# Patient Record
Sex: Female | Born: 1950 | Race: White | Hispanic: No | Smoking: Former smoker
Health system: Southern US, Community
[De-identification: ages and names within clinical notes are randomized; demographics above are authoritative.]

## PROBLEM LIST (undated history)

## (undated) DIAGNOSIS — M1990 Unspecified osteoarthritis, unspecified site: Secondary | ICD-10-CM

## (undated) DIAGNOSIS — N2589 Other disorders resulting from impaired renal tubular function: Secondary | ICD-10-CM

## (undated) DIAGNOSIS — F319 Bipolar disorder, unspecified: Secondary | ICD-10-CM

## (undated) DIAGNOSIS — E782 Mixed hyperlipidemia: Secondary | ICD-10-CM

## (undated) DIAGNOSIS — Z9989 Dependence on other enabling machines and devices: Secondary | ICD-10-CM

## (undated) DIAGNOSIS — I2510 Atherosclerotic heart disease of native coronary artery without angina pectoris: Secondary | ICD-10-CM

## (undated) DIAGNOSIS — I10 Essential (primary) hypertension: Secondary | ICD-10-CM

## (undated) DIAGNOSIS — I509 Heart failure, unspecified: Secondary | ICD-10-CM

## (undated) DIAGNOSIS — I471 Supraventricular tachycardia, unspecified: Secondary | ICD-10-CM

## (undated) DIAGNOSIS — E119 Type 2 diabetes mellitus without complications: Secondary | ICD-10-CM

## (undated) DIAGNOSIS — G4733 Obstructive sleep apnea (adult) (pediatric): Secondary | ICD-10-CM

## (undated) HISTORY — PX: OTHER SURGICAL HISTORY: SHX169

## (undated) HISTORY — DX: Other disorders resulting from impaired renal tubular function: N25.89

## (undated) HISTORY — DX: Supraventricular tachycardia: I47.1

## (undated) HISTORY — DX: Type 2 diabetes mellitus without complications: E11.9

## (undated) HISTORY — DX: Supraventricular tachycardia, unspecified: I47.10

## (undated) HISTORY — DX: Atherosclerotic heart disease of native coronary artery without angina pectoris: I25.10

## (undated) HISTORY — DX: Obstructive sleep apnea (adult) (pediatric): G47.33

## (undated) HISTORY — DX: Mixed hyperlipidemia: E78.2

## (undated) HISTORY — PX: CATARACT EXTRACTION: SUR2

## (undated) HISTORY — DX: Dependence on other enabling machines and devices: Z99.89

## (undated) HISTORY — DX: Bipolar disorder, unspecified: F31.9

## (undated) HISTORY — DX: Essential (primary) hypertension: I10

---

## 2004-03-19 ENCOUNTER — Ambulatory Visit: Payer: Self-pay | Admitting: Cardiology

## 2004-03-19 ENCOUNTER — Inpatient Hospital Stay (HOSPITAL_COMMUNITY): Admission: AD | Admit: 2004-03-19 | Discharge: 2004-03-21 | Payer: Self-pay | Admitting: *Deleted

## 2004-03-25 ENCOUNTER — Inpatient Hospital Stay (HOSPITAL_COMMUNITY): Admission: AD | Admit: 2004-03-25 | Discharge: 2004-03-28 | Payer: Self-pay | Admitting: Cardiology

## 2004-04-01 ENCOUNTER — Ambulatory Visit: Payer: Self-pay | Admitting: Cardiology

## 2004-04-10 ENCOUNTER — Ambulatory Visit: Payer: Self-pay | Admitting: Cardiology

## 2004-04-29 ENCOUNTER — Ambulatory Visit: Payer: Self-pay | Admitting: Cardiology

## 2004-07-17 ENCOUNTER — Ambulatory Visit: Payer: Self-pay | Admitting: Cardiology

## 2004-08-01 ENCOUNTER — Ambulatory Visit: Payer: Self-pay | Admitting: Cardiology

## 2004-08-06 ENCOUNTER — Ambulatory Visit: Payer: Self-pay | Admitting: Cardiology

## 2004-08-15 ENCOUNTER — Ambulatory Visit: Payer: Self-pay | Admitting: Cardiology

## 2004-10-27 ENCOUNTER — Ambulatory Visit: Payer: Self-pay | Admitting: Cardiology

## 2004-11-04 ENCOUNTER — Ambulatory Visit: Payer: Self-pay | Admitting: Cardiology

## 2004-12-02 ENCOUNTER — Ambulatory Visit: Payer: Self-pay | Admitting: Cardiology

## 2005-02-23 ENCOUNTER — Ambulatory Visit: Payer: Self-pay | Admitting: Cardiology

## 2005-03-19 ENCOUNTER — Ambulatory Visit: Payer: Self-pay | Admitting: Cardiology

## 2005-09-02 ENCOUNTER — Ambulatory Visit: Payer: Self-pay | Admitting: Cardiology

## 2006-01-25 ENCOUNTER — Ambulatory Visit: Payer: Self-pay | Admitting: Cardiology

## 2006-02-04 ENCOUNTER — Ambulatory Visit: Payer: Self-pay | Admitting: Cardiology

## 2006-03-01 ENCOUNTER — Ambulatory Visit: Payer: Self-pay | Admitting: Cardiology

## 2006-10-05 ENCOUNTER — Ambulatory Visit: Payer: Self-pay | Admitting: Cardiology

## 2006-10-18 ENCOUNTER — Encounter: Payer: Self-pay | Admitting: Cardiology

## 2006-10-26 ENCOUNTER — Ambulatory Visit (HOSPITAL_BASED_OUTPATIENT_CLINIC_OR_DEPARTMENT_OTHER): Admission: RE | Admit: 2006-10-26 | Discharge: 2006-10-27 | Payer: Self-pay | Admitting: Specialist

## 2006-11-18 ENCOUNTER — Ambulatory Visit: Payer: Self-pay | Admitting: Cardiology

## 2007-02-27 ENCOUNTER — Encounter: Payer: Self-pay | Admitting: Cardiology

## 2007-05-10 ENCOUNTER — Ambulatory Visit: Payer: Self-pay | Admitting: Internal Medicine

## 2007-05-10 ENCOUNTER — Ambulatory Visit (HOSPITAL_BASED_OUTPATIENT_CLINIC_OR_DEPARTMENT_OTHER): Admission: RE | Admit: 2007-05-10 | Discharge: 2007-05-11 | Payer: Self-pay | Admitting: Specialist

## 2007-10-17 ENCOUNTER — Ambulatory Visit: Payer: Self-pay | Admitting: Cardiology

## 2007-10-17 ENCOUNTER — Encounter: Payer: Self-pay | Admitting: Physician Assistant

## 2007-10-26 ENCOUNTER — Ambulatory Visit: Payer: Self-pay | Admitting: Cardiology

## 2007-12-14 ENCOUNTER — Ambulatory Visit: Payer: Self-pay | Admitting: Cardiology

## 2007-12-14 ENCOUNTER — Encounter: Payer: Self-pay | Admitting: Cardiology

## 2007-12-14 DIAGNOSIS — I2510 Atherosclerotic heart disease of native coronary artery without angina pectoris: Secondary | ICD-10-CM | POA: Insufficient documentation

## 2007-12-14 DIAGNOSIS — M545 Low back pain, unspecified: Secondary | ICD-10-CM | POA: Insufficient documentation

## 2007-12-14 DIAGNOSIS — Z9861 Coronary angioplasty status: Secondary | ICD-10-CM | POA: Insufficient documentation

## 2007-12-14 DIAGNOSIS — R0609 Other forms of dyspnea: Secondary | ICD-10-CM | POA: Insufficient documentation

## 2007-12-14 DIAGNOSIS — R0989 Other specified symptoms and signs involving the circulatory and respiratory systems: Secondary | ICD-10-CM

## 2007-12-14 DIAGNOSIS — F319 Bipolar disorder, unspecified: Secondary | ICD-10-CM | POA: Insufficient documentation

## 2007-12-14 DIAGNOSIS — G4733 Obstructive sleep apnea (adult) (pediatric): Secondary | ICD-10-CM | POA: Insufficient documentation

## 2007-12-23 ENCOUNTER — Ambulatory Visit: Payer: Self-pay | Admitting: Cardiology

## 2007-12-23 ENCOUNTER — Inpatient Hospital Stay (HOSPITAL_COMMUNITY): Admission: EM | Admit: 2007-12-23 | Discharge: 2007-12-29 | Payer: Self-pay | Admitting: Cardiology

## 2007-12-26 ENCOUNTER — Encounter: Payer: Self-pay | Admitting: Cardiology

## 2008-01-05 ENCOUNTER — Ambulatory Visit: Payer: Self-pay | Admitting: Cardiology

## 2008-02-27 ENCOUNTER — Encounter: Payer: Self-pay | Admitting: Cardiology

## 2008-03-01 ENCOUNTER — Ambulatory Visit: Payer: Self-pay | Admitting: Cardiology

## 2008-03-03 ENCOUNTER — Encounter: Payer: Self-pay | Admitting: Cardiology

## 2008-03-19 ENCOUNTER — Encounter: Payer: Self-pay | Admitting: Cardiology

## 2008-04-02 ENCOUNTER — Encounter: Payer: Self-pay | Admitting: Cardiology

## 2008-04-13 ENCOUNTER — Encounter: Payer: Self-pay | Admitting: Cardiology

## 2008-07-19 ENCOUNTER — Encounter: Payer: Self-pay | Admitting: Cardiology

## 2008-08-17 ENCOUNTER — Ambulatory Visit: Payer: Self-pay | Admitting: Cardiology

## 2008-09-18 ENCOUNTER — Telehealth: Payer: Self-pay | Admitting: Cardiology

## 2009-02-18 ENCOUNTER — Encounter: Payer: Self-pay | Admitting: Cardiology

## 2009-02-18 ENCOUNTER — Ambulatory Visit: Payer: Self-pay | Admitting: Cardiology

## 2009-02-18 ENCOUNTER — Encounter: Payer: Self-pay | Admitting: Physician Assistant

## 2009-02-18 ENCOUNTER — Telehealth (INDEPENDENT_AMBULATORY_CARE_PROVIDER_SITE_OTHER): Payer: Self-pay | Admitting: *Deleted

## 2009-02-19 ENCOUNTER — Encounter: Payer: Self-pay | Admitting: Cardiology

## 2009-04-08 ENCOUNTER — Ambulatory Visit: Payer: Self-pay | Admitting: Cardiology

## 2009-04-11 ENCOUNTER — Encounter: Payer: Self-pay | Admitting: Cardiology

## 2009-04-12 ENCOUNTER — Encounter: Payer: Self-pay | Admitting: Cardiology

## 2009-05-15 ENCOUNTER — Encounter: Payer: Self-pay | Admitting: Cardiology

## 2009-11-13 ENCOUNTER — Encounter: Payer: Self-pay | Admitting: Cardiology

## 2009-11-22 ENCOUNTER — Ambulatory Visit: Payer: Self-pay | Admitting: Cardiology

## 2009-11-22 DIAGNOSIS — I5032 Chronic diastolic (congestive) heart failure: Secondary | ICD-10-CM | POA: Insufficient documentation

## 2010-02-16 LAB — CONVERTED CEMR LAB
BUN: 34 mg/dL
Chloride, Serum: 102 mmol/L
Potassium, serum: 4.3 mmol/L

## 2010-02-18 NOTE — Letter (Signed)
Summary: [facility] D/C DR. Weldon Picking [facility]  [facility] D/C DR. Monico Blitz   Imported By: Delfino Lovett 04/08/2009 13:52:09  _____________________________________________________________________  External Attachment:    Type:   Image     Comment:   External Document

## 2010-02-18 NOTE — Progress Notes (Signed)
Summary: CHEST PAIN  Phone Note Call from Patient Call back at Home Phone [phone]   Caller: Patient Summary of Call: Patient c/o pressure in her chest that she's had all night. Patient stated that she left message on voicemail this morning. Nurse informed patient that she needed to go to ED for evaluation. Initial call taken by: Georgina Peer,  February 18, 2009 4:11 PM  Follow-up for Phone Call        Spoke with pt.  Stated she did go to ER last p.m and they did keep her overnight at [facility].  Stated that she did see Dr. Stanford Breed this morning and was told that pain was not cardiac related.  Does  have appt. to f/u here on 2/24.   Follow-up by: Lovina Reach, LPN,  February  1, 624THL 5:08 PM

## 2010-02-18 NOTE — Consult Note (Signed)
Summary: CARDIOLOGY CONSULT/ [facility]/ [facility]   Imported By: Delfino Lovett 04/08/2009 13:57:40  _____________________________________________________________________  External Attachment:    Type:   Image     Comment:   External Document

## 2010-02-18 NOTE — Assessment & Plan Note (Signed)
Summary: 6 MO FU REMINDER-SRS   Visit Type:  Follow-up Primary Provider:  Dr. Manuella Ghazi   History of Present Illness: the patient is a 60 year old female with a history of coronary artery disease. The patient had a catheterization in December of 2009 which showed a patent stent. Showed an ejection fraction of 50%. Earlier this year she reports recurrent substernal chest pain and a Cardiolite study was done by her primary care physician. Reportedly this was negative. The patient states overall she's been doing better. She does get occasional chest pains but does not take nitroglycerin. Last office visit also we had to increase her Lasix because of increasedvolume overload. She is doing better from that perspective. She denies any orthopnea or PND. She is asking me if she can take an occasional extra dose of Lasix if needed.  Preventive Screening-Counseling & Management  Alcohol-Tobacco     Smoking Status: quit     Year Quit: 2009  Current Medications (verified): 1)  Nitroglycerin 0.4 Mg Subl (Nitroglycerin) .... Place 1 Tablet Under Tongue As Directed 2)  Labetalol Hcl 200 Mg Tabs (Labetalol Hcl) .... Take 2 Tablet By Mouth Two Times A Day 3)  Crestor 20 Mg Tabs (Rosuvastatin Calcium) .... Take One Tablet By Mouth At Bedtime. Need To Schedule Appt For Further Refills 4)  Furosemide 80 Mg Tabs (Furosemide) .... Take One Tablet By Mouth Two Times A Day. Pt Needs To Schedule Appt. 5)  Trazodone Hcl 50 Mg Tabs (Trazodone Hcl) .... Take 1 Tablet By Mouth Once A Day 6)  Isosorbide Mononitrate Cr 60 Mg Xr24h-Tab (Isosorbide Mononitrate) .... Take 1 Tablet By Mouth Once A Day 7)  Aspir-Low 81 Mg Tbec (Aspirin) .... Take 1 Tablet By Mouth Once A Day 8)  Wellbutrin Xl 300 Mg Xr24h-Tab (Bupropion Hcl) .... Take 1 Tablet By Mouth Once A Day 9)  Levemir 100 Unit/ml Soln (Insulin Detemir) .... Use As Directed 10)  Depakote 250 Mg Tbec (Divalproex Sodium) .... Take 1 Tablet By Mouth Daily 11)  Diltiazem Hcl  120 Mg Tabs (Diltiazem Hcl) .... Take 1 Tablet By Mouth Once A Day 12)  Fish Oil 1000 Mg Caps (Omega-3 Fatty Acids) .... Take 1 Tablet By Mouth Two Times A Day 13)  Metformin Hcl 500 Mg Tabs (Metformin Hcl) .... Take 1 Tablet By Mouth Two Times A Day 14)  Allopurinol 100 Mg Tabs (Allopurinol) .... Take 1 Tablet By Mouth Once A Day 15)  Daily Multiple Vitamins  Tabs (Multiple Vitamin) .... Take Po Once Daily 16)  Calcium Carbonate 600 Mg Tabs (Calcium Carbonate) .... Take By Mouth Once Daily 17)  Zoloft 100 Mg Tabs (Sertraline Hcl) .... Take One Tablet By Mouth Once Daily 18)  Novolog 100 Unit/ml Soln (Insulin Aspart) .... Subq Twice A Day 19)  Victoza 18 Mg/61ml Soln (Liraglutide) .... One Injection Daily 20)  Vitamin D 400 Unit Caps (Cholecalciferol) .... Take 1 Tablet By Mouth Once A Day  Allergies (verified): 1)  ! Penicillin 2)  Tetracycline  Comments:  Nurse/Medical Assistant: The patient's medication list and allergies were reviewed with the patient and were updated in the Medication and Allergy Lists.  Past History:  Past Medical History: history of supraventricular tachycardia a long-acting diltiazem negative cardiac monitor January 2008 normal left ventricular ejection fraction single vessel coronary artery disease status post costar stenting of the first obtuse marginal artery March 2006 negative adenosine cardio lyte study ejection fraction 57% February 2007 type IV renal tubular acidosis dyslipidemia insulin-dependent diabetes mellitus hypertension morbid  obesity history of tobacco obstructive sleep apnea CPAP complianceline Myoview 2011 by her primary care physician reportedly negative for ischemia  bipolar disorder  Review of Systems       The patient complains of chest pain and shortness of breath.  The patient denies fatigue, malaise, fever, weight gain/loss, vision loss, decreased hearing, hoarseness, palpitations, prolonged cough, wheezing, sleep apnea,  coughing up blood, abdominal pain, blood in stool, nausea, vomiting, diarrhea, heartburn, incontinence, blood in urine, muscle weakness, joint pain, leg swelling, rash, skin lesions, headache, fainting, dizziness, depression, anxiety, enlarged lymph nodes, easy bruising or bleeding, and environmental allergies.    Vital Signs:  Patient profile:   60 year old female Height:      63 inches Weight:      252 pounds Pulse rate:   73 / minute BP sitting:   116 / 65  (left arm) Cuff size:   large  Vitals Entered By: Georgina Peer (November 22, 2009 1:22 PM)  Physical Exam  Additional Exam:  General: Well-developed, well-nourished in no distress head: Normocephalic and atraumatic eyes PERRLA/EOMI intact, conjunctiva and lids normal nose: No deformity or lesions mouth normal dentition, normal posterior pharynx neck: Supple, no JVD.  No masses, thyromegaly or abnormal cervical nodes lungs: Normal breath sounds bilaterally without wheezing.  Normal percussion heart: regular rate and rhythm with normal S1 and S2, no S3 or S4.  PMI is normal.  No pathological murmurs abdomen: Normal bowel sounds, abdomen is soft and nontender without masses, organomegaly or hernias noted.  No hepatosplenomegaly musculoskeletal: Back normal, normal gait muscle strength and tone normal pulsus: Pulse is normal in all 4 extremities Extremities: 2-3+ peripheral pitting edema neurologic: Alert and oriented x 3 skin: Intact without lesions or rashes cervical nodes: No significant adenopathy psychologic: Normal affect    Impression & Recommendations:  Problem # 1:  CORONARY ATHEROSCLEROSIS NATIVE CORONARY ARTERY (ICD-414.01) patient had a recent negative stress test by her primary care physician. Continue current medical therapy although she has occasional chest pains and I will increase her Imdur to 90 mg a day. Her updated medication list for this problem includes:    Nitroglycerin 0.4 Mg Subl (Nitroglycerin)  .Marland Kitchen... Place 1 tablet under tongue as directed    Labetalol Hcl 200 Mg Tabs (Labetalol hcl) .Marland Kitchen... Take 2 tablet by mouth two times a day    Isosorbide Mononitrate Cr 60 Mg Xr24h-tab (Isosorbide mononitrate) .Marland Kitchen... Take 1 1/2 tablet by mouth once daily.    Aspir-low 81 Mg Tbec (Aspirin) .Marland Kitchen... Take 1 tablet by mouth once a day    Diltiazem Hcl 120 Mg Tabs (Diltiazem hcl) .Marland Kitchen... Take 1 tablet by mouth once a day  Problem # 2:  OBSTRUCTIVE SLEEP APNEA (ICD-327.23) compliant with CPAP.  Problem # 3:  CHRONIC DIASTOLIC HEART FAILURE (0000000) the patient was told that she could take an extra Lasix if needed if her weight is up greater than 3 pounds. The following medications were removed from the medication list:    Lasix 80 Mg Tabs (Furosemide) .Marland Kitchen... Take 1 tablet by mouth three times a day x 3 days, then back to two times a day Her updated medication list for this problem includes:    Nitroglycerin 0.4 Mg Subl (Nitroglycerin) .Marland Kitchen... Place 1 tablet under tongue as directed    Labetalol Hcl 200 Mg Tabs (Labetalol hcl) .Marland Kitchen... Take 2 tablet by mouth two times a day    Furosemide 80 Mg Tabs (Furosemide) .Marland Kitchen... Take 1 tablet by mouth three times  a day    Isosorbide Mononitrate Cr 60 Mg Xr24h-tab (Isosorbide mononitrate) .Marland Kitchen... Take 1 1/2 tablet by mouth once daily.    Aspir-low 81 Mg Tbec (Aspirin) .Marland Kitchen... Take 1 tablet by mouth once a day    Diltiazem Hcl 120 Mg Tabs (Diltiazem hcl) .Marland Kitchen... Take 1 tablet by mouth once a day  Patient Instructions: 1)  Your physician wants you to follow-up in: 6 months. You will receive a reminder letter in the mail one-two months in advance. If you don't receive a letter, please call our office to schedule the follow-up appointment. 2)  Increase Imdur (isosorbide) to 90mg  by mouth once daily. This is 1 1/2 of your 60mg  tablets.  3)  Increase Lasix (furosemide) to 80mg  by mouth three times a day. Prescriptions: FUROSEMIDE 80 MG TABS (FUROSEMIDE) Take 1 tablet by mouth three  times a day  #90 x 2   Entered by:   Gurney Maxin, RN, BSN   Authorized by:   Terald Sleeper, MD, [facility]   Signed by:   Gurney Maxin, RN, BSN on 11/22/2009   Method used:   Electronically to        [facility] (retail)       [place], [place]  [address]       Ph: [phone]       Fax: [phone]   RxIDAL:[id number] ISOSORBIDE MONONITRATE CR 60 MG XR24H-TAB (ISOSORBIDE MONONITRATE) Take 1 1/2 tablet by mouth once daily.  #45 x 6   Entered by:   Gurney Maxin, RN, BSN   Authorized by:   Terald Sleeper, MD, [facility]   Signed by:   Gurney Maxin, RN, BSN on 11/22/2009   Method used:   Electronically to        [facility] (retail)       [address]       [place], [place]  [address]       Ph: [phone]       Fax: [phone]   RxID:   [id number]

## 2010-02-18 NOTE — Assessment & Plan Note (Signed)
Summary: 6 MO FU REMINDER-SRS   Visit Type:  Follow-up Primary Provider:  Dr. Manuella Ghazi  CC:  follow-up visit.  History of Present Illness: the patient is a 60 year old female with a history of stent placement. Repeat catheterization December 2009 showed patent stent. She has an ejection fraction of 50%. She has a history of chronic renal sufficiency with type IV renal tubular acidosis, dyslipidemia, insulin-dependent diabetes mellitus and hypertension. The patient also sees her SVT quiesced and on diltiazem. She also has obstructive sleep apnea. This  She denies any chest pain. She does report some increase in shortness of breath with exertional symptoms. She denies any orthopnea PND. She is compliant with her CPAP device. She denies any palpitations presyncope or syncope. The patient has noticed over the last several weeks increased lower extremity edema.  Preventive Screening-Counseling & Management  Alcohol-Tobacco     Smoking Status: quit  Comments: quit 2 years ago   Current Medications (verified): 1)  Nitroglycerin 0.4 Mg Subl (Nitroglycerin) .... Place 1 Tablet Under Tongue As Directed 2)  Labetalol Hcl 400  Mg Tabs (Labetalol Hcl) .... Take 2 Tablet By Mouth Two Times A Day 3)  Crestor 20 Mg Tabs (Rosuvastatin Calcium) .... Take One Tablet By Mouth At Bedtime. Need To Schedule Appt For Further Refills 4)  Furosemide 80 Mg Tabs (Furosemide) .... Take One Tablet By Mouth Two Times A Day. Pt Needs To Schedule Appt. 5)  Vistaril 25 Mg Caps (Hydroxyzine Pamoate) .... Take 1 Tablet By Mouth Two Times A Day 6)  Trazodone Hcl 50 Mg Tabs (Trazodone Hcl) .... Take 1 Tablet By Mouth Once A Day 7)  Isosorbide Mononitrate Cr 60 Mg Xr24h-Tab (Isosorbide Mononitrate) .... Take 1 Tablet By Mouth Once A Day 8)  Aspir-Low 81 Mg Tbec (Aspirin) .... Take 1 Tablet By Mouth Once A Day 9)  Wellbutrin Xl 300 Mg Xr24h-Tab (Bupropion Hcl) .... Take 1 Tablet By Mouth Once A Day 10)  Levemir 100 Unit/ml Soln  (Insulin Detemir) .Marland Kitchen.. 100u Subcutaneously in Am and 110u Sq in Pm 11)  Depakote 250 Mg Tbec (Divalproex Sodium) .... Take 1 Tablet By Mouth Two Times A Day 12)  Diltiazem Hcl 120 Mg Tabs (Diltiazem Hcl) .... Take 1 Tablet By Mouth Once A Day 13)  Fish Oil 1000 Mg Caps (Omega-3 Fatty Acids) .... Take 1 Tablet By Mouth Two Times A Day 14)  Januvia 100 Mg Tabs (Sitagliptin Phosphate) .... Take 1 Tablet By Mouth Once A Day 15)  Allopurinol 100 Mg Tabs (Allopurinol) .... Take 1 Tablet By Mouth Once A Day 16)  Daily Multiple Vitamins  Tabs (Multiple Vitamin) .... Take Po Once Daily 17)  Calcium Carbonate 600 Mg Tabs (Calcium Carbonate) .... Take By Mouth Once Daily 18)  Zoloft 100 Mg Tabs (Sertraline Hcl) .... Take One Tablet By Mouth Once Daily 19)  Novolog 100 Unit/ml Soln (Insulin Aspart) .... Subq Twice A Day 20)  Zoloft 100 Mg Tabs (Sertraline Hcl) 21)  Lasix 80 Mg Tabs (Furosemide) .... Take 1 Tablet By Mouth Three Times A Day X 3 Days, Then Back To Two Times A Day  Allergies: 1)  ! Penicillin 2)  Tetracycline  Comments:  Nurse/Medical Assistant: The patient's medications and allergies were reviewed with the patient and were updated in the Medication and Allergy Lists. Atilano Median RN (April 08, 2009 3:00 PM)  Past History:  Past Medical History: Last updated: 12/14/2007 history of supraventricular tachycardia a long-acting diltiazem negative cardiac monitor January 2008 normal left ventricular  ejection fraction single vessel coronary artery disease status post costar stenting of the first obtuse marginal artery March 2006 negative adenosine cardio lyte study ejection fraction 57% February 2007 type IV renal tubular acidosis dyslipidemia insulin-dependent diabetes mellitus hypertension morbid obesity history of tobacco obstructive sleep apnea CPAP complianceline  bipolar disorder  Past Surgical History: Last updated: 12/14/2007 right shoulder adhesive  capsulitis rotator cuff impingement syndrome rotator cuff tear acromioclavicular arthritis  Family History: Last updated: 12/14/2007 broader bipolar.  Father diabetes and back surgery.  Social History: Last updated: 12/14/2007 she has lived with some of her daughter's should stop smoking tobacco.  He works part-time retirement for the city.  No alcohol use.  Risk Factors: Smoking Status: quit (04/08/2009)  Review of Systems       The patient complains of shortness of breath and leg swelling.  The patient denies fatigue, malaise, fever, weight gain/loss, vision loss, decreased hearing, hoarseness, chest pain, palpitations, prolonged cough, wheezing, sleep apnea, coughing up blood, abdominal pain, blood in stool, nausea, vomiting, diarrhea, heartburn, incontinence, blood in urine, muscle weakness, joint pain, rash, skin lesions, headache, fainting, dizziness, depression, anxiety, enlarged lymph nodes, easy bruising or bleeding, and environmental allergies.    Vital Signs:  Patient profile:   60 year old female Height:      63 inches Weight:      250 pounds BMI:     44.45 Pulse rate:   62 / minute BP sitting:   131 / 67  (left arm) Cuff size:   large  Vitals Entered By: Atilano Median RN (April 08, 2009 2:52 PM)  Nutrition Counseling: Patient's BMI is greater than 25 and therefore counseled on weight management options. CC: follow-up visit   Physical Exam  Additional Exam:  General: Well-developed, well-nourished in no distress head: Normocephalic and atraumatic eyes PERRLA/EOMI intact, conjunctiva and lids normal nose: No deformity or lesions mouth normal dentition, normal posterior pharynx neck: Supple, no JVD.  No masses, thyromegaly or abnormal cervical nodes lungs: Normal breath sounds bilaterally without wheezing.  Normal percussion heart: regular rate and rhythm with normal S1 and S2, no S3 or S4.  PMI is normal.  No pathological murmurs abdomen: Normal bowel  sounds, abdomen is soft and nontender without masses, organomegaly or hernias noted.  No hepatosplenomegaly musculoskeletal: Back normal, normal gait muscle strength and tone normal pulsus: Pulse is normal in all 4 extremities Extremities: 2-3+ peripheral pitting edema neurologic: Alert and oriented x 3 skin: Intact without lesions or rashes cervical nodes: No significant adenopathy psychologic: Normal affect    Impression & Recommendations:  Problem # 1:  PERCUTANEOUS TRANSLUMINAL CORONARY ANGIOPLASTY, HX OF (ICD-V45.82) the patient reports no recurrent chest pain. No indication for ischemia testing.  Problem # 2:  DYSPNEA ON EXERTION (ICD-786.09) patient some evidence of volume overload with significant lower extremity edema we will increase her Lasix for several days. I also asked her to cut back on her salt and fluid intake. Her updated medication list for this problem includes:    Furosemide 80 Mg Tabs (Furosemide) .Marland Kitchen... Take one tablet by mouth two times a day. pt needs to schedule appt.    Aspir-low 81 Mg Tbec (Aspirin) .Marland Kitchen... Take 1 tablet by mouth once a day    Diltiazem Hcl 120 Mg Tabs (Diltiazem hcl) .Marland Kitchen... Take 1 tablet by mouth once a day    Lasix 80 Mg Tabs (Furosemide) .Marland Kitchen... Take 1 tablet by mouth three times a day x 3 days, then back to two times a  day  Problem # 3:  OBSTRUCTIVE SLEEP APNEA (ICD-327.23) Assessment: Comment Only  Other Orders: EKG w/ Interpretation (93000)  Patient Instructions: 1)  Increase Lasix to 80mg  three times a day x 3 days, then back to two times a day.   2)  Follow up in  6 months. Prescriptions: FUROSEMIDE 80 MG TABS (FUROSEMIDE) Take one tablet by mouth two times a day. Pt needs to schedule appt.  #60 x 6   Entered by:   Lovina Reach, LPN   Authorized by:   Terald Sleeper, MD, [facility]   Signed by:   Lovina Reach, LPN on 075-GRM   Method used:   Electronically to        [facility] (retail)       [place], [place]  [address]       Ph: [phone]       Fax: [phone]   RxID:   [id number] CRESTOR 20 MG TABS (ROSUVASTATIN CALCIUM) Take one tablet by mouth at bedtime. Need to schedule appt for further refills  #30 x 6   Entered by:   Lovina Reach, LPN   Authorized by:   Terald Sleeper, MD, [facility]   Signed by:   Lovina Reach, LPN on 075-GRM   Method used:   Electronically to        [facility] (retail)       [address]       [place],   [address]       Ph: [phone]       Fax: [phone]   RxID:   [id number]

## 2010-04-30 ENCOUNTER — Other Ambulatory Visit: Payer: Self-pay | Admitting: *Deleted

## 2010-04-30 MED ORDER — FUROSEMIDE 80 MG PO TABS: 80.0000 mg | ORAL_TABLET | Freq: Three times a day (TID) | ORAL | Status: DC

## 2010-05-06 ENCOUNTER — Encounter: Payer: Self-pay | Admitting: Cardiology

## 2010-06-03 NOTE — Discharge Summary (Signed)
Christy [name], Christy [name]                ACCOUNT NO.:  [id number]   MEDICAL RECORD NO.:  [id number]          PATIENT TYPE:  INP   LOCATION:  [id number]                         FACILITY:  [facility]   PHYSICIAN:  Loralie Champagne, MD      DATE OF BIRTH:  03/05/1950   DATE OF ADMISSION:  12/23/2007  DATE OF DISCHARGE:  12/29/2007                               DISCHARGE SUMMARY   PRIMARY CARDIOLOGIST:  Ernestine Mcmurray, MD,FACC in [place].   PRIMARY CARE PHYSICIAN:  Dr. Brigitte Pulse.   PROCEDURES PERFORMED DURING HOSPITALIZATION:  1. Cardiac catheterization.  This was completed per Dr. Eustace Quail      on December 26, 2007.      a.     Nonobstructive coronary artery disease with irregularities       in the left anterior descending artery, 0% stenosis at the CoStar       stent site in the mid circumflex coronary artery and 70% narrowing       in the posterior descending branch of the right coronary with       borderline mild global hypokinesis and estimated ejection fraction       of 50%.      b.     Marked elevation of left and right-sided filling pressures       with pulmonary wedge pressure of 34 mmHg and pulmonary artery       pressure of 59/28 with a mean of 43.   FINAL DISCHARGE DIAGNOSES:  1. Coronary artery disease.      a.     Status post stent to the mid circumflex with 70% narrowing       in the posterior descending branch of the right coronary artery.  2. Diastolic congestive heart failure.  3. Pulmonary hypertension.  4. Status post abnormal dobutamine myocardial perfusion study dated      December 15, 2007.      a.     Global left ventricular function severely reduced with an EF       of 27% with severe global hypokinesis present.  The patient       developed ventricular tachycardia during stress test.      b.     Large completely reversible apical to basal anterior and       anterior septal defect associated with severe hypokinesis       consistent with ischemia.  5. History of supraventricular  tachycardia.  6. Type 4 renal tubular acidosis.  7. Dyslipidemia.  8. Insulin-dependent diabetes.  9. Hypertension.  10.Morbid obesity.  11.Obstructive sleep apnea, on CPAP.  12.Bipolar disorder.  13.History of tobacco use.   HOSPITAL COURSE:  This is a 60 year old morbidly obese Caucasian female  with above-mentioned history who was seen at Dr. Arlina Robes office on  followup after being assessed for continued dyspnea on exertion.  The  patient had a dobutamine stress test, which was completed on December 18, 2007, which was abnormal as described above.  The patient was felt  to be a candidate for cardiac catheterization in the setting to evaluate  for coronary  artery disease progression with known history of stent to  the circumflex artery.  The patient was admitted to [facility]  on December 23, 2007, and did undergo cardiac catheterization on December 26, 2007, with results as described above.  The patient had been placed  on heparin and Integrilin prior to the catheterization with no evidence  of bleeding seen.  The patient continued to have some dyspnea on  exertion, which was chronic for.  Her enzymes were cycled and found to  be positive with initial troponin of 1.17.  The patient's creatinine  rose slightly from 1.2 to 1.63 and she was given gentle hydration prior  to the catheterization.   The patient did undergo cardiac catheterization on December 26, 2007.  Please see Dr. Nichola Sizer thorough cardiac catheterization note for more  details.  The patient was found to have nonobstructive CAD.  It was  found that she also had marked diastolic congestive heart failure with  volume overload.  The patient was started on IV Lasix for diuresis and a  2D echo was completed.  Echocardiogram revealing normal LV systolic  function with a range being 60%-65% with left ventricular wall thickness  mildly increased.  The inferior vena cava was moderately dilated.  The  patient's  medications were adjusted also to assist with blood pressure  control and diuresis.  The patient was followed with strict I and O to  continue diuresis and the patient lost approximately 3.6 kg since  admission.  The patient's blood pressure remained mildly elevated at 0000000  to [id number] systolic.  The patient's creatinine did begin to normalize with a  final creatinine of 1.3.  ACE inhibitor was not instituted secondary to  the elevated creatinine and should be readdressed by Dr. Dannielle Burn on  followup visit.  The patient was changed to p.o. Lasix 80 mg twice a  day.  Her Imdur was continued.  Labetalol was increased to 400 mg twice  a day and her other medications remained the same.  The patient is to  follow up with Dr. Terald Sleeper in approximately 1 week.  It was noted per  Dr. Claris Gladden note that the patient had been lisinopril, but her  potassium began to rise and this was discontinued in the past.  This can  be addressed again by Dr. Dannielle Burn on followup appointment.  A BMET will  be drawn before she sees Dr. Dannielle Burn for his evaluation of kidney  function on that visit.  No other medications have been adjusted or  change that she was taking prior to admission.   DISCHARGE LABS AND VITALS:  Sodium 144, potassium 4.1, chloride 104, CO2  of 29, BUN 50, creatinine 1.3, glucose 56, magnesium 2.9, hemoglobin  10.7, hematocrit 31.5.  White blood cells 7.8, platelets 193.  Blood  pressure 151/67, heart rate 58, respirations 18.  The patient's weight  on discharge 112.3 kg, admission 115.9 kg.   DISCHARGE MEDICATIONS:  1. Lasix 80 mg twice a day (prescription provided).  2. Aspirin 81 mg daily.  3. Diltiazem CD 120 mg daily.  4. Crestor 20 mg at bedtime.  5. Fish oil 2 g daily.  6. Imdur 60 mg daily.  7. Labetalol 400 mg twice a day (increased from 300 mg daily).  8. Atarax p.r.n.  9. Levemir 100 units subcu in the a.m., 110 units subcu in the p.m.  10.Trazodone 50 mg daily as needed.  11.NovoLog  insulin per sliding scale.  12.Depakote 250 mg daily.  13.Zoloft 100 mg twice a day.  14.Multivitamin daily.  15.Calcium daily.   ALLERGIES:  PENICILLIN and TETRACYCLINE and intolerance to ACE causing  hyperkalemia.   FOLLOWUP PLANS AND APPOINTMENT:  1. The patient is to follow up with Dr. Terald Sleeper on January 05, 2008, at 3:30 p.m.  2. Prior to that appointment, the patient will have a BMET drawn for      results to be discussed at that appointment.  3. The patient has been given post cardiac catheterization      instructions with particular emphasis on the right groin      site for evidence of bleeding, hematoma, or signs of infection.  4. The patient is to follow up with Dr. Brigitte Pulse for continued medical      management and diabetes assessment.   Time spent with the patient to include physician time 40 minutes.      Phill Myron. Purcell Nails, NP      Loralie Champagne, MD  Electronically Signed    KML/MEDQ  D:  12/29/2007  T:  12/29/2007  Job:  [id number]   cc:   Dr. Brigitte Pulse

## 2010-06-03 NOTE — Assessment & Plan Note (Signed)
[place]                          [facility] NOTE   Christy [name], Christy [name]                       MRN:          [id number]  DATE:11/18/2006                            DOB:          1950/06/27    HISTORY OF PRESENT ILLNESS:  The patient is a 60 year old female with a  history of prior supraventricular tachycardia felt to be either atypical  atrial flutter or AVNRT. The patient is not doing well. She really  reports no recurrent palpitations. She was seen a month and a half ago  and we cleared her for surgery. 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 Blood work  done during her shoulder surgery revealed a potassium of 4.6, well  within range. From a cardiac perspective, the patient has been doing  well. She has no chest pain, shortness of breath, orthopnea, or PND. She  has no recurrent palpitations.   MEDICATIONS:  1. Lasix 80 mg b.i.d.  2. Aspirin 81 mg daily.  3. Depakote 250 mg b.i.d.  4. Sodium bicarbonate 650 mg t.i.d.  5. Labetalol 300 mg b.i.d.  6. Vistaril.  7. Trazodone.  8. Imdur 60 mg daily.  9. Calcium.  10.Wellbutrin.  11.Levemir.  12.NovoLog.  13.Zoloft.  14.Multivitamin.  15.Zocor 80 mg daily.  16.Diltiazem 120 mg daily.   PHYSICAL EXAMINATION:  VITAL SIGNS:  Blood pressure 139/77, heart rate  69, weight 236 pounds.  NECK:  Normal carotid upstroke, no carotid bruits.  LUNGS:  Clear breath sounds bilaterally.  HEART:  Regular rate and rhythm with a normal S1 and S2. No murmurs,  rubs, or gallops.  ABDOMEN:  Soft and nontender. No rebound or guarding. Good bowel sounds.  EXTREMITIES:  No cyanosis, clubbing, or edema.  NEUROLOGIC:  The patient is alert, oriented, and grossly non-focal.   PROBLEM LIST:  1. History of supraventricular tachycardia.      a.     Quiescent on Diltiazem.      b.     No evidence of dysrhythmia by CardioNet monitor.  2. Normal LV  function.  3. Single vessel coronary artery disease.      a.     Status post CoStar stent to first obtuse marginal branch in       March 2006.      b.     Negative adenosine Cardiolite study in February 2007.  4. Obstructive sleep apnea on CPAP.  5. Insulin diabetes mellitus.  6. Dyslipidemia.  7. History of hypertension, controlled.  8. History of tobacco use.  9. Bipolar disorder.  10.Obesity.  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   PLAN:  1. From a cardiovascular standpoint, the patient is stable. We      reviewed the patient's laboratory work and her potassium is under      control.  2. The patient is currently off Plavix and does not need to be      resumed.  3. The patient will follow up with [place] in six months.  4. I made  no change in the patient's medical regimen.     Ernestine Mcmurray, MD,FACC  Electronically Signed    GED/MedQ  DD: 11/18/2006  DT: 11/19/2006  Job #: [id number]   cc:   Monico Blitz, MD  Alison Murray, M.D.

## 2010-06-03 NOTE — Assessment & Plan Note (Signed)
Christy [name]                          [facility] NOTE   Christy [name], Christy [name]                       MRN:          [id number]  DATE:10/17/2007                            DOB:          [birth year]    PRIMARY CARDIOLOGIST:  Christy Mcmurray, MD, [facility].   REASON FOR VISIT:  Scheduled annual followup.   Christy [name] remains stable from a cardiovascular standpoint.  She reports  no significant increase in the occasional palpitations that she has had  over the years.  She denies any exertional angina pectoris and, in fact,  states that she has never had chest pain.  She does have some chronic,  albeit mild, exertional dyspnea with no recent exacerbation.  She denies  any PND, orthopnea, or any significant lower extremity edema.  She  reports compliance with CPAP.   The patient is limited in her activities secondary to lumbar pain.  It  is not clear, however, if there is some element of exertional dyspnea  which limits her activity, as well.  She seems to suggest that it is  mainly secondary to the lower back discomfort.  She has not had a formal  evaluation for this.  She does have history of arthritis, and underwent  prior right shoulder surgery.   An EKG in our office today reveals NSR at 67 bpm with normal axis and  chronic Q-waves in the anteroseptal leads.   CURRENT MEDICATIONS:  1. Labetalol 300 b.i.d.  2. Imdur 60 daily.  3. Lasix 80 daily.  4. Aspirin 81 daily.  5. Wellbutrin 300 daily,  6. Levemir 100 units q. a.m./110 q. p.m.  7. Depakote 250 b.i.d.  8. Zoloft 100 b.i.d.  9. Trazodone 100 nightly.  10.Vistaril 25 b.i.d.  11.Zocor 80 daily.  12.Diltiazem 120 daily.   PHYSICAL EXAMINATION:  VITAL SIGNS:  Blood pressure 118/65, pulse 64 and  regular, weight 245 (up 9).  GENERAL:  A 60 year old female, morbidly obese, sitting upright, in no  distress.  HEENT:  Normocephalic and atraumatic.  PERRLA.  EOMI.  NECK:  Palpable carotid pulse  without bruits; no JVD at 90 degrees.  LUNGS:  Clear to auscultation in all fields.  HEART:  Regular rate and rhythm.  No significant murmurs.  No rubs.  ABDOMEN:  Soft and nontender.  EXTREMITIES:  No pedal edema.  NEUROLOGIC:  No focal deficit.   IMPRESSION:  1. History of supraventricular tachycardia.      a.     Quiescent on long-acting diltiazem.      b.     Negative CardioNet monitor, January 2008.  2. Normal LVF.  3. Single-vessel CAD.      a.     Status post CoStar stenting of first obtuse marginal artery,       March 2006.      b.     Negative adenosine Cardiolite; EF 57%, February 2007.  4. Type 4 Renal Tubular Acidosis.      a.     Followed by Dr. Lowanda Foster.  5. Dyslipidemia.      a.  On high-dose simvastatin.  6. IDDM.  7. Hypertension.  8. Morbid obesity.  9. History of tobacco.  10.Obstructive sleep apnea, CPAP compliant.   PLAN:  1. A 2D echocardiogram for reassessment of LV function.  If there is      any suggestion of a decline in EF, or evidence of a wall motion      abnormality, then further workup is indicated with a probable      repeat adenosine Cardiolite.  Of note, the patient denies any      development of chest pain.  However, she did undergo percutaneous      intervention 3 years ago, secondary to progressive dyspnea.  2. Add omega-3 fish oil 1 tablet b.i.d. for added treatment of      dyslipidemia.  Her most recent profile this past March was notable      for a total cholesterol of 172, triglyceride 252, HDL of 57, and      LDL 85.  3. P.r.n. nitroglycerin.  4. Schedule return clinic followup with myself and Dr. Dannielle Burn in 2      months, for review of echocardiogram results and further      recommendations.      Gene Serpe, PA-C  Electronically Signed      Christy Mcmurray, MD,FACC  Electronically Signed   GS/MedQ  DD: 10/17/2007  DT: 10/18/2007  Job #: [id number]   cc:   Monico Blitz, MD

## 2010-06-03 NOTE — Assessment & Plan Note (Signed)
Christy                          [name]   Christy, [name]                       MRN:          [id number]  DATE:01/05/2008                            DOB:          [birth year]    HISTORY OF PRESENT ILLNESS:  The patient is a 60 year old female with a  history of prior CoStar stent placement to the circumflex coronary  artery in 2006, who has had symptoms with shortness of breath with  exertion, was admitted to [facility], where she had a Myoview  scan showed an ejection fraction of 27%, was possible for ischemia.  However, the patient was during this test in ventricular tachycardia.  She was transferred for further evaluation.  Catheterization  demonstrated, however, that her stent was widely patent and that she had  otherwise nonobstructive disease with 70% narrowing in the posterior  descending branch of the right coronary artery.  However, she had marked  elevation of left and right side filling pressure with pulmonary  capillary wedge pressure 34 and pulmonary artery pressure 59/28 with a  mean of 43.   The patient recently was decreased on her Lasix.  Unfortunately, the  patient has significant renal insufficiency and requires Lasix for fluid  management.  She is doing now much better.  She is less short of breath,  her creatinine did go up from 1.4-1.7, but this likely where she needs  to be remain euvolemic.  At [facility], they want to start the patient on ACE  inhibitor, but this is not possible given her type 4 renal tubular  acidosis and hyperkalemia.   MEDICATION:  1. Lasix 80 mg p.o. b.i.d.  2. Labetalol 200 mg one tablet p.o. b.i.d.  3. Crestor 10 mg p.o. nightly.  4. Vistaril 25 mg p.o. b.i.d.  5. Trazodone 50 mg p.o. nightly.  6. Imdur 60 mg p.o. daily.  7. Calcium.  8. Aspirin.  9. Wellbutrin.  10.Levemir.  11.NovoLog,  12.Depakote.  13.Zoloft.  14.Multivitamin.  15.Diltiazem 120 mg p.o.  daily.  16.Omega-3 fish oil.   PHYSICAL EXAMINATION:  VITAL SIGNS:  Blood pressure 149/68, heart rate  is 58, and weight is 243 pounds.  NECK:  Normal carotid upstroke.  No carotid bruits.  LUNGS:  Clear breath sounds bilaterally.  HEART:  Regular rate and rhythm.  Normal S1 and S2.  No murmurs or  gallops.  ABDOMEN:  Soft.  Nontender.  No rebound or guarding.  Good bowel sounds.  EXTREMITIES:  No cyanosis, clubbing, or edema.  NEUROLOGIC:  The patient is alert, oriented, and grossly nonfocal.   PROBLEM LIST:  1. Coronary artery disease, stable.  2. Diastolic heart failure.  3. Chronic renal insufficiency, slightly worsened.  4. Type 4 renal tubular acidosis, unable to use ACE inhibitor.   PLAN:  The patient is to continue our current Lasix regimen.  She can  increase her fluid intake slightly.  We will see her back in 2 months  and recheck her edema at that time.     Ernestine Mcmurray, MD,FACC  Electronically Signed    GED/MedQ  DD:  01/05/2008  DT: 01/06/2008  Job #: [id number]

## 2010-06-03 NOTE — Assessment & Plan Note (Signed)
[facility]                          [facility] NOTE   Christy [name], Christy [name]                       MRN:          [id number]  DATE:03/01/2008                            DOB:          Oct 06, 1950    PRIMARY CARDIOLOGIST:  Dr. Ernestine Mcmurray, MD.   REASON FOR VISIT:  Scheduled followup.  Please refer to Dr. Arlina Robes  previous note of December 2009, for full details.   Christy [name] remains stable from a clinical standpoint, since being  hospitalized with acute diastolic heart failure in early December, at  [facility].  Although she has some mild, chronic exertional  dyspnea, she has not had any of the symptoms associated with that  previous hospitalization.  She is able to sleep on 1 pillow with no  orthopnea, denies PND, and has never had any lower extremity edema.  She  is also denying any exertional chest discomfort.   Mr. [place] is carefully monitoring her total fluid intake, at  approximately 64 ounces, and does not add any salt to her diet.   MOST RECENT BLOOD WORK:  Sodium 139, potassium 4.3, BUN 34, creatinine  1.1, and glucose 67.  A previous creatinine of 1.7 with a BUN of 63.   CURRENT MEDICATIONS:  1. Lasix 80 b.i.d.  2. Labetalol 400 b.i.d.  3. Crestor 20 nightly.  4. Imdur 60 daily.  5. Aspirin 81 daily.  6. Wellbutrin XL 300 daily.  7. Levemir 130 units q.a.m./110 units q.p.m.  8. Depakote 250 b.i.d.  9. Zoloft 100 b.i.d.  10.Diltiazem 120 daily.  11.Omega-3.  12.Fish oil 1 g b.i.d.  13.CPAP.   PHYSICAL EXAMINATION:  VITAL SIGNS:  Blood pressure 143/70, pulse 57,  regular, weight 244 (up 1 pound).  GENERAL:  A 60 year old female, morbidly obese, sitting upright, no  distress.  HEENT:  Normocephalic, atraumatic.  NECK:  Palpable bilateral carotid pulse without bruits; no JVD at 9  degrees.  LUNGS:  Diminished breath sounds in the bases, but no crackles or  wheezes.  HEART:  Regular rate and rhythm.  A soft grade  2/6 systolic ejection  murmur at the base.  No rubs.  ABDOMEN:  Protuberant, intact bowel sounds.  EXTREMITIES:  No edema.  NEURO:  No focal deficits.   IMPRESSION:  1. Chronic diastolic heart failure.      a.     Currently, euvolemic.  2. Single-vessel CAD.      a.     Nonobstructive CAD by catheterization, December 2009, with       widely patent CoStar CFX stent site.  3. Chronic renal insufficiency.      a.     Type 4 renal tubular acidosis  4. Hypertension.  5. Dyslipidemia.  6. Type 2 diabetes mellitus.  7. Morbid obesity.  8. Obstructive sleep apnea, on CPAP.  9. Remote tobacco.  10.History of SVT, quiescent on diltiazem.   PLAN:  1. Continue current medication regimen.  2. Surveillance fasting lipid profile in 1 month.  The patient was      switched over to current dose of Crestor  during her recent      hospitalization.  She was previously on high-dose simvastatin.  Her      lipid profile indicated an LDL of 117, in early December.  3. Schedule return clinic followup with myself and Dr. Dannielle Burn in 4      months, or sooner if needed.      Gene Serpe, PA-C  Electronically Signed      Ernestine Mcmurray, MD,FACC  Electronically Signed   GS/MedQ  DD: 03/01/2008  DT: 03/02/2008  Job #: [id number]   cc:   Monico Blitz, MD

## 2010-06-03 NOTE — Cardiovascular Report (Signed)
Christy [name], Christy [name]                ACCOUNT NO.:  [id number]   MEDICAL RECORD NO.:  [id number]          PATIENT TYPE:  INP   LOCATION:  3740                         FACILITY:  [place]   PHYSICIAN:  Bruce R. Olevia Perches, MD, FACCDATE OF BIRTH:  [birth year]   DATE OF PROCEDURE:  DATE OF DISCHARGE:                            CARDIAC CATHETERIZATION   ADDENDUM   We did a distal aortogram because of the patient's mild renal  insufficiency with a creatinine of 1.3 and hypertension.  This showed no  evidence of renal artery stenosis and there was no aortic aneurysm.  There did not appear to be any major disease down to the proximal  iliacs.      Bruce Alfonso Patten Olevia Perches, MD, [facility]  Electronically Signed     BRB/MEDQ  D:  12/26/2007  T:  12/26/2007  Job:  [id number]

## 2010-06-03 NOTE — Assessment & Plan Note (Signed)
[facility]                          [facility] NOTE   FAERYN, MUNDINE                       MRN:          [id number]  DATE:10/05/2006                            DOB:          08/02/1950    REASON FOR CONSULTATION:  Evaluation of patient prior to surgical  clearance.   HISTORY OF PRESENT ILLNESS:  The patient is a 60 year old female with a  history of prior supraventricular tachycardia either felt to be atypical  atrial flutter versus AVNRT.  This rhythm was not felt to be atrial  fibrillation; therefore, no recommendation was given to start the  patient on Coumadin.  The patient does have a history of coronary artery  disease which appears to be stable.  She reports no chest pain,  shortness of breath, orthopnea, PND.  She does have a decreased exercise  tolerance secondary to deconditioning.  She does report occasional  palpitations that have been very short-lived and according to her  history have not been particularly bothersome.  The patient states that  she has a frozen shoulder and will need orthopedic surgery.  She also  is under the care of Dr. 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  The patient is currently in bicarbonate therapy and Lasix.  From a cardiovascular perspective, however, again, as outlined above the  patient appears to be stable.   MEDICATIONS:  1. Lasix 80 mg p.o. b.i.d.  2. Aspirin 81 mg p.o. daily.  3. Depakote 250 mg b.i.d.  4. Sodium bicarb 650 mg p.o. t.i.d.  5. Labetalol 200 mg.  6. Vistaril 25 mg p.o. b.i.d.  7. Trazodone.  8. Imdur 60 mg p.o. daily.  9. Calcium.  10.Wellbutrin.  11.Levemir.  12.Zoloft.  13.Multivitamin.  14.Zocor 80 mg a day.  15.Diltiazem 120 mg p.o. daily.   PHYSICAL EXAMINATION:  VITAL SIGNS:  Blood pressure is 142/67, heart  rate 75, weight is 238 pounds.  NECK:  Normal carotid upstroke  with a soft left carotid bruit.  HEENT is otherwise normal.  LUNGS:  Clear breath sounds bilaterally.  HEART:  Regular rate and rhythm, normal S1-S2, no murmurs, rubs or  gallops.  ABDOMEN:  Soft, nontender, with no rebound or guarding.  Good bowel  sounds.  EXTREMITIES:  No cyanosis, clubbing or edema.  NEUROLOGIC:  Patient alert and oriented and grossly nonfocal.   PROBLEM LIST:  1. History of supraventricular tachycardia.      a.     Quiescent on diltiazem.      b.     No evidence of dysrhythmia by CardioNet monitor.  2. Normal left ventricular function.  3. Single-vessel coronary artery disease.      a.     Status post Costar stent to first obtuse marginal branch       March 2006.      b.     Negative adenosine Cardiolite study, February 2007.  4. Obstructive sleep apnea on continuous positive airway pressure.  5. Insulin-dependent diabetes mellitus.  6. Dyslipidemia.  7. History of hypertension, controlled.  8. History of tobacco use.  9. History of bipolar disorder.  10.Obesity.  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   PLAN:  1. From a cardiovascular perspective the patient appears to be stable.      She reports no chest pain and had a Cardiolite study done a year      ago which was negative for ischemia.  2. The patient is currently off of Plavix and there should be no      particular contraindication to proceeding with orthopedic surgery.  3. The patient will follow up with Dr. Lowanda Foster in regard to her      hyperkalemia and current treatment regimen.  4. The patient also appears to have a soft left carotid bruit, and we      will schedule her for carotid Dopplers.  5. The patient does appear to be at low risk for orthopedic surgery      and can likely proceed in the next couple of weeks.     Ernestine Mcmurray, MD,FACC  Electronically Signed    GED/MedQ  DD: 10/05/2006  DT: 10/06/2006  Job #: [id number]   cc:   Monico Blitz, MD  Cynda Familia, M.D.  Pollyann Savoy, Surgical Coordinator

## 2010-06-03 NOTE — Op Note (Signed)
NAMESHATORIA, GODLESKI                ACCOUNT NO.:  [id number]   MEDICAL RECORD NO.:  [id number]          PATIENT TYPE:  AMB   LOCATION:  [facility]                         FACILITY:  [facility]   PHYSICIAN:  Cynda Familia, M.D.DATE OF BIRTH:  05/17/1950   DATE OF PROCEDURE:  10/26/2006  DATE OF DISCHARGE:                               OPERATIVE REPORT   PREOPERATIVE DIAGNOSIS:  Right shoulder adhesive capsulitis, rotator  cuff impingement syndrome, possible rotator cuff tear, acromioclavicular  arthritis.   POSTOPERATIVE DIAGNOSIS:  Right shoulder adhesive capsulitis, rotator  cuff impingement syndrome, acromioclavicular arthritis.   PROCEDURE:  Right shoulder manipulation under anesthesia, glenohumeral  arthroscopy with intra-articular synovectomy and intra-articular  injection of corticosteroid, arthroscopic subacromial decompression with  acromioplasty, bursectomy, and coracoacromial ligament release,  arthroscopic distal clavicle resection, Mumford procedure.   SURGEON:  Cynda Familia, M.D.   ASSISTANT:  Judith Part. Chabon, P.A.-C.   ANESTHESIA:  Attempted interscalene block followed by general.   ESTIMATED BLOOD LOSS:  Less than 50 mL.   DRAINS:  None.   COMPLICATIONS:  None.   DISPOSITION:  To the PACU stable.   OPERATIVE DETAILS:  The patient was counseled in the holding area and  the correct side was identified.  An IV was started.  Antibiotics were  given.  An interscalene block was attempted by the anesthesiologist but  reported to me as being unsuccessful.  She was then taken to the  operating room and placed in the supine position and general anesthesia  administered.  The right shoulder initially was examined.  Forward  flexion to 150, abduction to 110, external rotation 40, internal  rotation 70 degrees.  After a gentle manipulation under anesthesia, she  had full range of motion in all areas evaluated.  This was done without  complication or problem.   She is now turned to a left lateral decubitus position, properly padded  and bumped.  The right upper extremity was prepped with DuraPrep and  draped in a sterile fashion. The shoulder position was utilized, 30  degrees of abduction, 10 degrees of forward flexion, 10 pounds  longitudinal traction.  A posterior portal was created and the  arthroscope was placed in the glenohumeral joint.  Diagnostic  arthroscopy revealed intra-articular synovitis consistent with adhesive  capsulitis.  An anterior portal was made from outside-in technique to  the rotator cuff interval.  Electrocautery device was entered for a  thermal synovectomy, being careful to not perform a thermal  capsulorrhaphy, only a light synovectomy superiorly in the areas of the  most robust synovitis.  The shoulder joint was copiously irrigated 40 mg  Kenalog was placed in the shoulder joint with 10 mL of 0.25% Marcaine  with epinephrine.   The subacromial region revealed a very thick subacromial bursa.  A port  was established and subacromial bursectomy performed.  She had a very  narrow compromise subacromial space, although the rotator cuff, both  intra-articular and extra-articular was intact, it was compromised by  impingement.  The ArthroCare system was utilized to release the  periosteum of the CA ligament, a bur was  then placed posteriorly and an  anterior-inferior acromioplasty was performed converting to a flat  acromion morphology decompressing the subacromial region.   The [facility] joint was found to be markedly osteoarthritic with a large right  subclavicular spur.  An accessory entry portal was made, the bur was  then placed.  The lateral 5-8 mm of clavicle was removed  circumferentially with the superior and posterior acromioclavicular  ligaments and capsule intact.  The clavicle was palpated and found to be  stable.  Debris was removed.  Hemostasis was obtained.  No other  abnormalities were noted.  Also of note,  intra-articular anatomy  revealed normal labrum, biceps, glenohumeral ligaments and articular  cartilage.  The shoulder joint was irrigated, the arthroscopic equipment  was removed.  Taken out of traction.  She had normal pulses in the wrist  at the end of the case.  The portals were closed with 4-0 nylon suture.  Another 20 mL of 0.5% Marcaine and epinephrine were placed in the portal  sites and subacromial region for pain and hemostasis.  A sterile  dressing was applied to the shoulder, turned supine, awakened, taken  from the operating room to the PACU in stable condition in a sling.  No  complications or problems.   To help with surgical technique and decision making, Mr. Molli Barrows,  PA-C, assistance was needed throughout the entire case.           ______________________________  Cynda Familia, M.D.     RAC/MEDQ  D:  10/26/2006  T:  10/26/2006  Job:  [id number]

## 2010-06-03 NOTE — Op Note (Signed)
Christy [name], Christy [name]                ACCOUNT NO.:  [id number]   MEDICAL RECORD NO.:  [id number]          PATIENT TYPE:  AMB   LOCATION:  [facility]                         FACILITY:  [facility]   PHYSICIAN:  Cynda Familia, M.D.DATE OF BIRTH:  07/24/1950   DATE OF PROCEDURE:  05/10/2007  DATE OF DISCHARGE:                               OPERATIVE REPORT   PREOPERATIVE DIAGNOSIS:  Right shoulder postoperative frozen shoulder,  adhesive capsulitis.   POSTOPERATIVE DIAGNOSIS:  Right shoulder postoperative frozen shoulder,  adhesive capsulitis.   OPERATION PERFORMED:  Right shoulder manipulation under anesthesia,  glenohumeral arthroscopy with intra-articular debridement, rotator cuff  interval released, intra-articular injection.  Subacromial debridement.   SURGEON:  Cynda Familia, M.D.   ASSISTANT:  Judith Part. Chabon, P.A.   ANESTHESIA:  Scalene block and general.   ESTIMATED BLOOD LOSS:  Less than 10 mL.   DRAINS:  None.   COMPLICATIONS:  None.   DISPOSITION:  To PACU stable.   DESCRIPTION OF PROCEDURE:  The patient was counseled in the holding  area.  Correct side was identified.  Chart was reviewed and signed  appropriately.  An IV was started and interscalene block was  administered.  IV antibiotics were given on the way to the operating  room.  In the operating room she was placed under general anesthesia.  Right shoulder was examined, range of motion forward flexion 160,  abduction 60, external rotation 70.  After general in place she had full  range of motion easily obtained.  She was turned to left lateral  decubitus position, properly padded and bumped. Prepped with DuraPrep,  draped in sterile fashion.  __________ 30 degrees abduction, __________  degrees forward flexion, 10 pounds longitudinal traction.  Posterior  portal was created and an arthroscope was placed into the glenohumeral  joint and diagnostic arthroscopy was undertaken.  The glenohumeral joint  revealed synovitis superiorly.  An anterior portal made with __________  technique through the rotator cuff interval.  Cautery system was  introduced and she underwent a thermal synovectomy to prevent bleeding.  In addition, the ArthroCare wand was used at  rotator cuff interval,  obtaining full external rotation.  Axillary pouch did not show any  excessive scarring at this time.  Irrigated.  40 mg of Kenalog with 10  mL of 1% lidocaine placed into the shoulder joint.   Subacromial region revealed scarring.  It was removed from the lateral  portal with a shaver and then hemostasis obtained with electrocautery.  Rotator cuff on the bursal and articular side was intact.  At this  point, she being well debrided, she had full range of motion.  There was  nice hemostasis obtained.  Irrigated and arthroscopic equipment was  removed.  She was taken out of traction.  Through the portal, placed one  suture, another 20 mL of 0.25% Marcaine with epinephrine was placed in  the portal site subacromial region for pain and hemostasis.  Sterile  dressing applied.  Turned supine and awakened.  Placed in a sling.  Taken from the operating room to PACU in stable condition.  Sponge  and  needle counts were correct.  No complications or problems.  The patient  will be stabilized in PACU with overnight stay for pain control and  observation for sleep apnea.   To help with surgical technique and decision making, Mr. Molli Barrows PA-  C assisted  throughout the entire case.           ______________________________  Cynda Familia, M.D.     RAC/MEDQ  D:  05/10/2007  T:  05/10/2007  Job:  [id number]

## 2010-06-03 NOTE — Assessment & Plan Note (Signed)
[place]                          [facility]   JYL, REIFER                       MRN:          [id number]  DATE:08/17/2008                            DOB:          01/14/1951    PRIMARY CARDIOLOGIST:  Ernestine Mcmurray, MD, [facility]   REASON FOR VISIT:  Scheduled followup.   Christy [name] returns to our clinic for ongoing monitoring of known coronary  artery disease and chronic diastolic heart failure.  She presents with  no interim development of signs or symptoms suggestive of unstable  angina pectoris or congestive heart failure.   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 Hinda Lenis.  Recent blood work notable for a BUN of 40 with a  creatinine of 1.2.   Ms. Borde has gained 11 pounds since the last visit.  However, this is  not related to volume overload, and she denies any excessive increase in  her fluid intake.  She also refrains from any added salt in her diet.   Loueen is unable to exercise, citing significant lower back pain.  She  does not describe symptoms of radiculopathy.  She has not been formally  evaluated for this in the past.   REVIEW OF SYSTEMS:  Complaint of right lower back pain with ambulation,  but with no discomfort in the right lower extremity or associated  numbness or tingling.  Denies orthopnea, PND, or significant lower  extremity edema.  Denies any exertional chest pain.  Has rare  palpitations.  All other systems reviewed, and are negative.   MEDICATIONS:  Labetalol 400 b.i.d., Vistaril 25 b.i.d., trazodone 50  nightly, Imdur 60 daily, Lasix 80 b.i.d., aspirin 81 daily, Wellbutrin  300 daily, Levemir 100 units q. a.m., 110 units q. p.m., Depakote 250  b.i.d., Zoloft 100 b.i.d., Crestor 20 daily, diltiazem 120 daily, fish  oil 1000 b.i.d., Januvia 100 daily, and allopurinol 100 daily.   PHYSICAL EXAMINATION:  VITAL SIGNS:  Blood pressure 134/56, pulse 64,  regular  weight 255 (up 11).  GENERAL:  A 60 year old female, obese, sitting upright, no distress.  HEENT:  Normocephalic, atraumatic.  NECK:  Palpable carotid pulse without bruits; no JVD at 90 degrees.  LUNGS:  Diminished breath sounds at bases, without crackles or wheezes.  HEART:  Regular rate and rhythm.  No significant murmurs.  ABDOMEN:  Protuberant, intact bowel sounds.  EXTREMITIES:  Trace pedal edema.  NEURO:  No focal deficit.   IMPRESSION:  1. Chronic diastolic heart failure, euvolemic.  2. Single-vessel CAD.      a.     Widely patent CoStar CFX stent site; residual 70% PDA branch       stenosis; EF 50% by catheterization, December 2009.  3. Chronic renal insufficiency.      a.     Type 4 renal tubular acidosis.  4. Dyslipidemia.      a.     LDL 67, March 2010.  5. Insulin-dependent diabetes mellitus.  6. Hypertension.  7. Obstructive sleep apnea, on CPAP.  8. Morbid obesity.  9.  History of SVT, quiescent on diltiazem.  10.Remote tobacco.  11.Lower back pain.   PLAN:  1. Continue current medication regimen.  2. We will refer Ms. Andrew to Dr. Jarvis Morgan of [facility], for further evaluation of lower back pain.  She      reports having had previous shoulder surgery by Dr. Theda Sers, and      would like to return to him for further evaluation of these current      symptoms.  Of note, her symptoms are such that it precludes her      from doing any type of regular exercise.  3. Schedule return clinic followup with myself and Dr. Dannielle Burn in 6      months.      Gene Serpe, PA-C  Electronically Signed      Ernestine Mcmurray, MD,FACC  Electronically Signed   GS/MedQ  DD: 08/17/2008  DT: 08/18/2008  Job #: [id number]   cc:   Monico Blitz, MD  Alison Murray, M.D.

## 2010-06-03 NOTE — Cardiovascular Report (Signed)
NAMEVELORA, ARPIN                ACCOUNT NO.:  [id number]   MEDICAL RECORD NO.:  [id number]          PATIENT TYPE:  INP   LOCATION:  3740                         FACILITY:  [place]   PHYSICIAN:  Bruce R. Olevia Perches, MD, FACCDATE OF BIRTH:  June 27, 1950   DATE OF PROCEDURE:  12/26/2007  DATE OF DISCHARGE:                            CARDIAC CATHETERIZATION   CLINICAL HISTORY:  Ms. Kathol is 60 years old and has had a previous  CoStar stent placed in the circumflex artery in 2006.  She recently has  had symptoms of shortness of breath with exertion and was admitted to  [facility] where she had a Myoview scan, which showed an ejection  fraction of 27% and was positive for ischemia.  She was transferred here  for further evaluation.  She has multiple risk factors including insulin-  dependent diabetes, hypertension, and hyperlipidemia.   PROCEDURE:  The procedure was performed via the femoral artery using an  arterial sheath and 5-French Performa coronary catheters.  A front-wall  arterial puncture was performed and Omnipaque contrast was used.  After  completion of the left heart catheterization, we decided to do a right  heart catheterization to try and determine the reason for her shortness  of breath.  This was performed via the right femoral vein using a venous  sheath and Swan-Ganz thermodilution catheter.  The patient tolerated the  procedure well and left the laboratory in satisfactory condition.   RESULTS:  The left main coronary artery:  The left main coronary artery  was free of significant disease.   The left anterior descending artery:  The left anterior descending  artery gave rise to a diagonal branch and a moderate-sized septal  perforator and several small septal perforators.  The LAD was small in  caliber and diffusely irregular, but has no major obstruction.   The circumflex artery:  The circumflex artery gave rise to a ramus  branch, an atrial branch, a second very  small marginal branch, and 2  large posterolateral branches.  There was 0% stenosis at the stent site  in the mid circumflex artery.  This was a CoStar stent.   The right coronary artery:  The right coronary artery was a moderately  large vessel that gave rise to a conus branch, 2 right ventricular  branches, a posterior descending branch, and a small posterolateral  branch.  There were 10% and 70% stenoses in the mid-to-distal posterior  descending branch.   The left ventriculogram:  The left ventriculogram performed in the RAO  projection showed mild global hypokinesis.  The estimated ejection  fraction was 50%.   HEMODYNAMIC DATA:  The right atrial pressure was 25 mean.  The pulmonary  artery pressure was 59/28 with mean of 43.  The pulmonary wedge pressure  was 34.  Left ventricular pressure was 159/40.  The aortic pressure was  159/66 with a mean of 98.  The cardiac output/cardiac index was 6.5/3.0  L/min/m2 by Fick.   CONCLUSION:  1. Nonobstructive coronary artery disease with irregularities in the      left anterior descending, 0% stenosis at  the CoStar stent site in      the mid circumflex artery, and 70% narrowing in the posterior      descending branch of the right coronary with borderline mild global      hypokinesis and estimated ejection fraction of 50%.  2. Marked elevation of left- and right-sided filling pressures with a      pulmonary wedge pressure of 34 mmHg and a pulmonary artery pressure      of 59/28 with a mean of 43.   RECOMMENDATIONS:  The patient has nonobstructive disease, but has marked  volume overload probably related to diastolic heart failure.  We will  plan to further evaluate this with a 2-D echo, and we will plan diuresis  with intravenous Lasix.      Bruce Alfonso Patten Olevia Perches, MD, [facility]  Electronically Signed     BRB/MEDQ  D:  12/26/2007  T:  12/26/2007  Job:  [id number]   cc:   Monico Blitz, MD  Ernestine Mcmurray, MD,FACC

## 2010-06-06 NOTE — Consult Note (Signed)
Christy [name], Christy [name]                ACCOUNT NO.:  [id number]   MEDICAL RECORD NO.:  [id number]          PATIENT TYPE:  OIB   LOCATION:  [id number]                         FACILITY:  [facility]   PHYSICIAN:  Donato Christy [name], M.D.DATE OF BIRTH:  03/21/1950   DATE OF CONSULTATION:  DATE OF DISCHARGE:                                   CONSULTATION   REASON FOR CONSULTATION:  Hyperkalemia.   [name] is a 60 year old white female with multiple medical problems, most  significant for diabetes mellitus, hypertension, tobacco abuse, bipolar  disorder and diffuse coronary artery disease, who was admitted last week for  uncontrolled hypertension and urgent hypertension.  She underwent cardiac  catheterization, which revealed moderate diffuse atherosclerotic disease  with lesions that were amenable to angioplasty in her first diagonal, obtuse  marginal, and her PDA.  They also performed renal arteriograms or angiograms  at that time, which did not reveal any significant disease.  These were  performed on March 20, 2004.  The patient was admitted on March 25, 2004, for  an elective angioplasty and stenting of the obtuse marginal; however, her  pre-catheterization labs were significant for a potassium of 6.3.  The  patient was taking Altace, this was held, and was treated with Kayexalate.  Her repeat potassium was 4.9 following the Kayexalate; however, the next  morning, today, her potassium was elevated again at 5.6.  We have been asked  to evaluate her fluctuating potassium levels.  Of note, she has been on  lithium for about 10 years and her BUN and creatinine were 13 and 0.6.  She  denies any potassium supplements and was taking excessive doses of  nonsteroidals last week but has not taken anything since Wednesday of last  week.   ALLERGIES:  PENICILLIN and TETRACYCLINE.   PAST MEDICAL HISTORY:  1.  Bipolar disorder, on lithium for 10 years.  2.  Hypertension for 10 years.  3.  Diabetes mellitus for  13 years.  4.  Hypercholesterolemia.  5.  Obesity.  6.  Tobacco abuse for about 30 years, one pack per day.  7.  Diffuse coronary artery disease.   MEDICATIONS:  1.  Altace 10 mg a day, currently on hold.  2.  Lipitor 20 mg a day.  3.  Plavix 75 mg a day.  4.  Lithium 450 mg a day.  5.  Labetalol 200 mg b.i.d.  6.  Insulin N 40 units in the morning, 70 units at night.  7.  Trazodone 50 mg at bedtime.  8.  Wellbutrin 300 mg a day.  9.  Zoloft 150 mg a day.   FAMILY HISTORY:  Mother is alive at age 40.  She has osteoporosis, otherwise  in good health.  Father died at age 35 from acute renal failure, sepsis and  myocardial infarction.  One brother, who also is bipolar.  No family history  of kidney disease.   SOCIAL HISTORY:  She is divorced and has two daughters, both in good health.  She has a 30 pack-year tobacco history, no alcohol and no drug use.   REVIEW  OF SYSTEMS:  GENERAL:  She denies any anorexia or malaise.  OPHTHALMIC:  No blurred vision, photophobia.  ENT:  She did have a headache  last week with the high blood pressure that has since resolved.  No  tinnitus, dysphagia, odynophagia.  CARDIAC:  No chest pain, palpitations,  orthopnea, PND.  PULMONARY:  No shortness of breath, hemoptysis, productive  cough.  GASTROINTESTINAL:  No nausea, vomiting or hematochezia, melena,  bright red blood per rectum.  GENITOURINARY:  No dysuria, polyuria,  hematuria, urgency, frequency or retention.  RHEUMATOLOGIC:  No arthralgias,  myalgias, painful joints.  All other systems negative.   PHYSICAL EXAMINATION:  GENERAL:  This is a well-developed, obese female in  no apparent distress.  VITAL SIGNS:  Temperature 97.3, pulse 53, blood pressure 147/65, respiratory  rate 20.  HEENT:  Normocephalic, atraumatic.  Pupils equally round, reactive to light,  extraocular movements intact.  No icterus.  Oropharynx is without lesions.  NECK:  Supple with a full range of motion.  No  lymphadenopathy, no bruits.  CHEST:  Lungs were clear to auscultation and percussion bilaterally.  No  rales, rubs or rhonchi.  CARDIAC:  Bradycardic at 52, no precordial rub appreciated.  ABDOMEN:  Normoactive bowel sounds, soft, nontender, nondistended.  There  were on bruits.  EXTREMITIES:  No clubbing, cyanosis, or edema.   LABORATORY DATA:  March 1:  Potassium 4.9, CO2 25, BUN/creatinine 41/0.8.  March 2:  Potassium 4.9, CO2 22, BUN/creatinine 33/0.6.  March 3:  Potassium 4.3, CO2 26, BUN/creatinine 33/0.9.  March 7:  Potassium 6.3, bicarb 26, BUN/creatinine 17/0.7.  March 8:  Potassium 5.6, CO2 25, BUN/creatinine 15/0.8.  March 8 at 8 a.m., potassium 4.9, CO2 26, BUN/creatinine 13/0.6.   ASSESSMENT AND PLAN:  1.  Hyperkalemia.  Differential diagnosis includes pseudohyperkalemia given      the multiple episodes of hemolyzed specimens, although some of these      have been repeated and verified without any visible hemolysis.  Also on      the differential for hyperkalemia is hemodynamic effects based on ACE      inhibitor, type IV renal tubular acidosis (hypoaldosteronism secondary      to diabetes mellitus), hyperglycemia, interstitial disease secondary to      chronic lithium ingestion, beta blocker, nonsteroidal anti-inflammatory      drug use, or potassium-specific defect in secretion within the kidneys.      At this time will calculate a transtubular potassium concentration      gradient as well as a plasma renin and aldosterone levels, cortisol      levels, lithium levels, and thyroid-stimulating hormone levels.  Will      check urine electrolytes and osmolality as well as a serum osmolality.      After these labs are drawn, I think it would be okay to restart her      Lasix at 40 mg a day to help control her potassium, and will continue to      follow.  Will also try to determine the etiology of her hyperkalemia.     My initial thought was renal artery stenosis; however, she  has a renal      angiogram which did not show significant stenosis at that time.  2.  Hypertension.  Blood pressure is mildly elevated at this time.  She is      off her Altace.  Would recommend to start an agent such as Norvasc 5 mg  a day and would also start Lasix once her labs are drawn, given her      elevated left ventricular end-diastolic pressure that was seen on      catheterization on March 20, 2004.  3.  Coronary artery disease.  Agree with holding off on catheterization      today; however, if her potassium is normal tomorrow with the diuretic      and other workup is negative, it should be safe to proceed with      catheterization and angioplasty.  Will continue to follow along.  4.  Bipolar disorder with lithium.  Will check a lithium level.  5.  Diabetes mellitus.  Will continue with the ADA diet and her insulin      regimen.   Thank you very much for this consult.  Will continue to follow along with  you.      JC/MEDQ  D:  03/26/2004  T:  03/26/2004  Job:  [id number]

## 2010-06-06 NOTE — Discharge Summary (Signed)
Christy [name], Christy [name]                ACCOUNT NO.:  [id number]   MEDICAL RECORD NO.:  [id number]          PATIENT TYPE:  INP   LOCATION:  [id number]                         FACILITY:  [facility]   PHYSICIAN:  Glori Bickers, M.D. LHCDATE OF BIRTH:  Sep 28, 1950   DATE OF ADMISSION:  03/19/2004  DATE OF DISCHARGE:  03/21/2004                                 DISCHARGE SUMMARY   DISCHARGE DIAGNOSES:  1.  Chest pain with negative enzymes.  2.  Progressive dyspnea.  3.  Status post cardiac catheterization showing left main with some minor      irregularities left anterior descending with diffuse 40-50% stenosis      throughout the mid section of the vessel, as well as diffuse distal      disease, however, there was no flow-limited lesions.  First diagonal      diffusely diseased with a long 70% lesion proximally.  Left circumflex      20-30% lesion proximally.  Obtuse marginal 1 had a 70% lesion proximally      with 50% lesion in the distal upper branch.  Right coronary artery with      multiple 20-30% lesions throughout.  There was tandem 60-70% lesion in      the mid posterior descending artery.  Left ventriculogram was not      performed secondary to markedly elevated left ventricular end-diastolic      pressure.  Abdominal aortogram showing mild plaquing of the abdominal      aorta, but no significant renal artery stenosis.  The patient will need      intervention to the obtuse marginal 1 lesion with medical therapy      otherwise.  Also has poorly controlled hypertension with marked elevated      end-diastolic pressure.  4.  Poorly controlled hypertension as stated above.  5.  Ongoing tobacco abuse.  6.  Obesity.  7.  Diabetes insulin-dependent.  8.  Mental illness requiring trazodone, Wellbutrin, Zoloft, lithium and      Desyrel.   PRIMARY CARE PHYSICIAN:  Dr. Danise Mina.   CARDIOLOGIST:  Dr. Terald Sleeper.   The patient is being discharged home on Plavix 75 mg daily, Altace 10 mg  daily,  Lipitor 20 mg daily, aspirin 325 daily, labetalol 200 mg p.o. b.i.d.  The patient refused a nitroglycerin patch due to severe headache.  Will send  home with nitroglycerin sublingual for chest discomfort.  The patient has to  continue her trazodone, Wellbutrin, Zoloft, insulin, lithium and Desyrel.  She will follow up with Dr. Toney Rakes concerning medications and possible  interactions with her lithium and need blood work for lithium levels.   HOSPITAL COURSE:  This 60 year old Caucasian female initially presented to  [facility] with complaints of elevated blood pressure and increased  shortness of breath.  Cardiac enzymes were negative.  The patient was  transferred to [facility] for cardiac catheterization.  Patient with  multiple cardiac risk factors.  Initially treated for hypertension.  Responded to medication.  To the cath lab on the 1st, results as stated  above.  The patient tolerated  catheterization without any complications.  At  this time, the patient will be discharged home.  She will return on March 7  for PCI to the OM1.  The patient has been instructed to return to short-stay  at 12 noon on Tuesday, March 7 for cardiac cath intervention by Dr. Lia Foyer.  She has also been instructed to have a clear liquid breakfast in the a.m. on  March 7 and to continue her medications until that time.   ACTIVITY:  Activity includes no driving for two days, no lifting over 10  pounds.  She is to follow a low fat, low salt, low cholesterol diabetic  diet.   WOUND CARE:  Gently clean cath site with soap and water.  No tub bathing x1  week.   She will need follow up blood work in four to six weeks for liver and lipid  panel.      MB/MEDQ  D:  03/21/2004  T:  03/21/2004  Job:  [id number]

## 2010-06-06 NOTE — Assessment & Plan Note (Signed)
[facility]                            [facility] OFFICE NOTE   Christy [name], Christy [name]                       MRN:          [id number]  DATE:09/02/2005                            DOB:          [birth year]    HISTORY OF PRESENT ILLNESS:  The patient is a 60 year old female with a  history of dyspnea probably secondary to diastolic dysfunction.  The patient  also has a history of coronary artery disease and is status post PCI with a  __________  study stent to the circumflex coronary artery.  She has a normal  ejection fraction.  The patient has been doing well from a cardiovascular  perspective.  She reports no substernal chest pain.  She had a  hospitalization earlier this year for dyspnea secondary to congestive heart  failure.  The patient has diuresed a significant amount and is feeling  markedly improved.  She states that she is now in __________  She has no  orthopnea, PND, palpitations or syncope.   MEDICATIONS:  1. Labetalol 300 b.i.d.  2. Vistaril 25 b.i.d.  3. Trazodone.  4. Lasix 100 mg p.o. every day (5 pills of 20 mg).  5. Lisinopril 10 mg p.o. b.i.d.  6. Zoloft 10 mg p.o. every day.  7. Novolin insulin.  8. Plavix 75 mg a day.  9. Calcium.  10.Aspirin.  11.Wellbutrin.  12.Caduet 10/40.  13.Imdur 60 mg every day.  14.Depakote.   PHYSICAL EXAMINATION:  VITAL SIGNS:  Blood pressure is 94/50,  heart rate is  74 beats per minute.  GENERAL:  Overweight white female but in no apparent distress.  NECK:  Normal carotid upstrokes, no carotid bruits.  LUNGS:  Clear.  HEART:  Regular rate and rhythm.  ABDOMEN:  Soft.  EXTREMITIES:  Exam reveals 1+ peripheral pitting edema.   PROBLEM LIST:  1. Dyspnea.      a.     Diastolic dysfunction.      b.     Normal left ventricular systolic function.  2. Coronary artery disease, status post __________  stent to the      circumflex.  3. Chronic lower extremity edema, improved with diuresis.  4. Severe obstructive sleep apnea on CPAP.  5. Type 2 diabetes mellitus.  6. Depression.  7. Dyslipidemia.  8. Obesity.  9. Tobacco use.   PLAN:  1. The patient is actually doing quite well from a cardiovascular      perspective.  She states that she is significantly less short of breath      than several months ago.  2. I told her to keep a close eye on her weight and if she has gained an      extra 2 or 3 pounds to take an extra dose of Lasix of 20 mg.  3. The patient appears to run chronically high potassium levels.  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  From that perspective, it should also  be relatively safe to take an extra dose of Lasix.                                   Ernestine Mcmurray, MD, [facility]   GED/MedQ  DD:  09/02/2005  DT:  09/02/2005  Job #:  [id number]   cc:   Carin Primrose, MD

## 2010-06-06 NOTE — Discharge Summary (Signed)
NAMETERES, KYLES                ACCOUNT NO.:  [id number]   MEDICAL RECORD NO.:  [id number]          PATIENT TYPE:  INP   LOCATION:  [id number]                         FACILITY:  [facility]   PHYSICIAN:  Loretha Brasil. Lia Foyer, M.D. [facility] OF BIRTH:  10/10/1950   DATE OF ADMISSION:  03/25/2004  DATE OF DISCHARGE:  03/28/2004                                 DISCHARGE SUMMARY   DISCHARGE DIAGNOSES:  1.  Status post cardiac catheterization with successful percutaneous      stenting of the circumflex (obtuse marginal - 1).  2.  Hyperkalemia, status post nephrology consultation this admission.   PAST MEDICAL HISTORY:  1.  Bipolar disorder.  2.  Hypertension.  3.  Diabetes.  4.  Hypercholesterolemia.  5.  Obesity.  6.  Tobacco abuse for about 30 years.  7.  Diffuse coronary artery disease.   DISCHARGE MEDICATIONS:  1.  Aspirin 325 daily.  2.  Plavix 75 daily.  3.  Labetalol 200 p.o. b.i.d.  4.  Lipitor 20 daily.  5.  Patient instructed to continue her previous medications which include      trazodone, Wellbutrin, Zoloft, insulin, lithium, and Desyrel per Dr.      Liane Comber.   PAIN MANAGEMENT:  Tylenol for general discomfort.  Nitroglycerin for chest  discomfort.   ACTIVITY:  No driving x2 days.  No lifting over 10 pounds x1 week.   WOUND CARE:  Gently clean cath site with soap and water.  No tub bathing x1  week.  She is to call our office for any fever, any pain or swelling from  cath site.   FOLLOW UP:  She has follow-up appointment.  She will need a BMET drawn next  Monday.  This has been arranged at [facility], [facility], office on  Monday, March 31, 2004, at 8:45.  She also has a follow-up appointment with  Dr. Dannielle Burn at our [place], [facility],  office on April 10, 2004, at 1:45.   HOSPITAL COURSE:  Ms. Edmunds is a 60 year old with complicated history of  coronary artery disease as stated above, status post cardiac catheterization  by Dr. Pierre Bali earlier this month.   Patient returned this admission  for percutaneous intervention and was found to be hyperkalemic.  Dr.  Marval Regal was asked to consult regarding hyperkalemia.  Initially thought  her hyperkalemia was hemodynamic effects based on ACE inhibitor, type IV  renal tubular acidosis, hyperglycemia, interstitial disease secondary to  chronic lithium ingestion, postpartum beta-blocker, possible NSAID use, her  potassium specific defect and secretion within the kidneys.  His initial  thought was renal artery stenosis, however, she had a renal angiogram which  did not show significant stenosis at that time.   Her blood pressure is mildly elevated.  We have stopped her Altace and  Lasix.  Would recommend starting an agent such as Norvasc 5 mg a day.  He  thought it was stable to proceed with plans for cardiac catheterization.  Patient to the catheterization on March 27, 2004.  Tolerated the procedure  without any complications.  Renal in to see the  patient again on March 27, 2004.  States the patient will likely require Lasix 20 to 40 mg a day,  especially if she is to continue with ACE inhibitor or ARB in the future.  Will need to have potassium levels checked as outpatient.  Dr. Lia Foyer in to  see patient on March 28, 2004.  He notes patient needs to be seen by Dr.  Liane Comber and Dr. Lia Foyer, himself, will call Dr. Liane Comber for further  information.  Aspirin and Plavix need to be continued x1 year.  Patient will  also need an anemia follow-up.  Dr. Lia Foyer spoke with renal and he feels  that the ACE and Lasix can be resumed under close supervision.  Dr. Lia Foyer  will arrange this outpatient between Dr. Liane Comber and Dr. Dannielle Burn.   DISCHARGE LABORATORY DATA:  Potassium 3.9, BUN 14, creatinine 0.8.  Hemoglobin 11.3, hematocrit 32.9.  Hemoglobin A1C of 9.2.   Patient afebrile.  Blood pressure 146/54.  Also note, Dr. Lia Foyer did not  request that the patient be sent home on the Norvasc 5 mg daily.  Will  leave  this up to Dr. Arlina Robes discretion when he sees the patient on April 10, 2004, or Dr. Liane Comber who will see the patient prior to that.      MB/MEDQ  D:  03/28/2004  T:  03/28/2004  Job:  [id number]

## 2010-06-06 NOTE — Cardiovascular Report (Signed)
NAMEMARA, BHULLAR NO.:  [id number]   MEDICAL RECORD NO.:  [id number]          PATIENT TYPE:  INP   LOCATION:  [id number]                         FACILITY:  [facility]   PHYSICIAN:  Glori Bickers, M.D. LHCDATE OF BIRTH:  1950-10-26   DATE OF PROCEDURE:  03/20/2004  DATE OF DISCHARGE:                              CARDIAC CATHETERIZATION   PRIMARY CARE PHYSICIAN:  Dr. Liane Comber, [place], [place].   CARDIOLOGIST:  Ernestine Mcmurray, M.D.   PATIENT IDENTIFICATION:  Christy [name] is a 60 year old woman with multiple  cardiac risk factors including ongoing tobacco use but no known coronary  disease who was referred for cardiac catheterization secondary to  progressive dyspnea and chest pain.   PROCEDURES PERFORMED:  1.  Selective coronary angiography.  2.  Left heart catheterization but no ventriculogram secondary to increased      LVEDP.  3.  Abdominal aortogram.   DESCRIPTION OF PROCEDURE:  Risks and benefits of the procedure were  discussed with Christy [name].  Consent was signed and placed on the chart.  The  right groin area was prepped and draped in routine sterile fashion.  Subsequently, the area was then anesthetized with 1% local lidocaine.  A 6  French arterial sheath was placed in the right femoral artery using a  modified Seldinger technique.  Standard catheters were used throughout the  catheterization including preformed Judkins, 6 French JR4, JL4 and bent  pigtail.  All catheter exchanges were made over a wire.  There were no  apparent complications.   FINDINGS:  Hemodynamics:  Central aortic pressure was 202/82 with a mean of  121.  LV pressure was 170/17 with LVEDP of 32.  There was a mild gradient on  aortic valve pull back of 6 mmHg.   Coronary anatomy:  1.  Left main had some minor irregularities.  2.  LAD gives off a long D1.  LAD had diffuse 40 to 50% stenosis throughout      the mid section of the vessel as well as a diffuse distal disease,  however, there was no flow limiting lesions.  3.  First diagonal was diffusely diseased with a long 70% lesion proximally.  4.  Left circumflex gave off a small ramus and a large branching OM1.  Left      circumflex had a 20 to 30% lesion proximally.  5.  The OM1 had a 70% lesion proximally with 50% lesions in the distal upper      branch.  6.  RCA was a dominant vessel which gave off a very long PDA and three PL.      There were multiple 20 to 30% lesions throughout the RCA.  There was      tandem 60 to 70% lesion in the mid PDA.   Left ventriculogram was not performed secondary to markedly elevated LVEDP.   Abdominal aortogram showed a mild plaquing of the abdominal aorta but no  significant renal artery stenosis.   CONCLUSION:  1.  Moderate diffuse coronary artery disease with borderline lesion in the      obtuse marginal I  and posterior descending artery.  Will review the cine      films with Dr. Lia Foyer regarding possible percutaneous intervention of      the obtuse marginal I lesion, however, I suspect medical therapy will be      the best plan here.  2.  Poorly controlled hypertension with markedly elevated end diastolic      pressure.  Hydralazine 20 mg and Lasix 40 mg IV given in the lab.  3.  No evidence of renal artery stenosis.      DB/MEDQ  D:  03/20/2004  T:  03/20/2004  Job:  [id number]   cc:   Dr. Rayann Heman, [place]   Ernestine Mcmurray, M.D. [facility]

## 2010-06-06 NOTE — Cardiovascular Report (Signed)
NAMEOMOLARA, KAMDAR                ACCOUNT NO.:  [id number]   MEDICAL RECORD NO.:  [id number]          PATIENT TYPE:  INP   LOCATION:  6529                         FACILITY:  [place]   PHYSICIAN:  Loretha Brasil. Lia Foyer, M.D. [facility] OF BIRTH:  Dec 03, 1950   DATE OF PROCEDURE:  03/27/2004  DATE OF DISCHARGE:                              CARDIAC CATHETERIZATION   INDICATIONS:  Ms. Pinder is a 60 year old with complicated history.  She has  diabetes.  She developed chest pain and underwent catheterization by Dr.  Haroldine Laws.  This catheterization procedure demonstrated a significant lesion  in the circumflex artery.  Based on this, and her history, I reviewed the  films with him and it was the feeling that percutaneous intervention was  recommended.  She was initially brought in 2 days ago at which time she was  demonstrated to have hyperkalemia.  She has subsequently been seen by the  renal service in consultation.  She is not volume contracted, but her ACE  inhibitors have been discontinued.  Lasix has been reinstituted. She is  brought to the catheterization laboratory for further evaluation and  treatment.   PROCEDURE:  Percutaneous stenting of the obtuse marginal-1.   DESCRIPTION OF PROCEDURE:  The patient was brought to the catheterization  laboratory, prepped and draped in usual fashion.  Through an anterior  puncture of the right femoral artery at the previous catheterization site  was entered.  A 6-French sheath was placed.  A JL4 guiding catheter was  utilized to intubate the left coronary artery.  Angiomax was given according  to protocol and ACT was checked and found to be appropriate.  The lesion was  crossed with a high-torque floppy wire.  Predilatation was done with a 2.25  x 15 Maverick balloon. Following predilatation, the lesion was eventually  stented using a 30 x 22 Costar study stent.  Generous doses of intracoronary  nitroglycerin were administered.  The stent was post  dilated using a 3 mm  Maverick up to 16 atmospheres.  There was marked improvement in the  appearance of the artery.  All catheters were subsequently removed and the  femoral sheath sewn into place.  The patient was given some Fentanyl for her  back pain.   ANGIOGRAPHIC DATA:  The circumflex provides an insignificant intermediate vessel.   There is a tiny insignificant first marginal and this is truly the large  first marginal.  There is an 80% somewhat segmental stenosis at this  location that was stented.  Post dilatation was done with a Quantum Maverick  balloon.  A 90% stenosis was reduced to 0% residual luminal narrowing.  Importantly, the distal vessel demonstrates a fair amount of diffuse areas  of luminal irregularity.  No critical stenoses are noted.   Ventriculography done at the completion of procedure demonstrates well-  preserved overall systolic function.   CONCLUSION:  Successful percutaneous stenting of the circumflex (obtuse  marginal-1).   DISPOSITION:  The patient will need aspirin and Plavix x1 year.  Followup  will be with Dr. Dannielle Burn.  Other medical problems will need to be addressed  and I will contact her primary physicians.      TDS/MEDQ  D:  03/27/2004  T:  03/27/2004  Job:  [id number]   cc:   Ernestine Mcmurray, M.D. [facility]   Glori Bickers, M.D. [facility]   Danie Chandler, M.D.

## 2010-06-06 NOTE — Assessment & Plan Note (Signed)
[facility]                          [facility]ICE NOTE   Christy, [name]                       MRN:          [id number]  DATE:03/01/2006                            DOB:          1950/12/05    PRIMARY CARDIOLOGIST:  Ernestine Mcmurray, M.D.   REASON FOR VISIT:  Post hospital followup.   Christy [name] is a 60 year old female recently referred to [place] in consultation  here at [facility] for evaluation of new-onset SVT.  The rhythm  was most suggestive of atypical atrial flutter versus AVNRT and not of  atrial fibrillation.  Therefore, we did not recommend Coumadin  anticoagulation at this time but did arrange for outpatient CardioNet  monitoring.  Medications were also adjusted with cessation of Caduet and  initiation of Cardizem 120 daily.   The patient now presents in followup:  Christy [name] has had no recurrent  tachypalpitations since discharge.  CardioNet monitor has been reviewed  and shows no evidence of any dysrhythmias.   CURRENT MEDICATIONS:  1. Diltiazem 120 daily.  2. Plavix.  3. Coated aspirin 325 daily.  4. Zocor 80 daily.  5. Zoloft 100 daily.  6. Depakote 250 daily.  7. NovoLog sliding scale insulin.  8. Levemir 100 units q.a.m./110 units q.p.m.  9. Wellbutrin XL 300 daily.  10.Lisinopril 10 daily.  11.Lasix 100 daily.  12.Imdur 60 daily.  13.Trazodone 100 q.h.s.  14.Vistaril 25 b.i.d.  15.Labetalol 300 b.i.d.   PHYSICAL EXAMINATION:  VITAL SIGNS:  Blood pressure 125/66, pulse 61 and  regular, weight 233.  GENERAL:  A 60 year old female, morbidly obese, sitting upright in no  distress.  NECK:  Palpable bilateral carotid pulses; no JVD.  LUNGS:  Diminished breath sounds in the bases but without crackles or  wheezes.  HEART:  Regular rate and rhythm (S1, S2).  No significant murmurs.  ABDOMEN:  Protuberant, nontender.  EXTREMITIES:  Palpable pulses without edema.  NEUROLOGIC:  No focal deficit.   IMPRESSION:  1.  Status post supraventricular tachycardia.      a.     Quiescent with addition of diltiazem.      b.     No evident dysrhythmia by CardioNet monitoring.  2. Normal left ventricular function.  3. Single-vessel coronary artery disease.      a.     Status post stent (Costar) first obtuse marginal branch       March 2006.      b.     Negative adenosine Cardiolite February 2007.  4. Obstructive sleep apnea/on CPAP.  5. Insulin-dependent diabetes mellitus.  6. Hyperlipidemia.  7. History of hypertension.  8. History of tobacco.  9. Bipolar disorder.  10.Morbid obesity.   PLAN:  1. No further workup for supraventricular tachycardia at this time.      However, if the patient were to develop recurrent      tachypalpitations, then we would need to reassess up-titration of      [name] versus referral to our      electrophysiology team in [place] for consideration of  radiofrequency ablation.  The patient is quite agreeable with this      plan.  2. No indication for Coumadin anticoagulation at this time, as      previously outlined.  [name] and full-      dose aspirin.  Given that Christy [name] is nearly 2 years out now from      placement of study stent, we will therefore discontinue Plavix and      have the patient remain on low-dose aspirin indefinitely.  3. Schedule return clinic followup with myself and Dr. Dannielle Burn in 6      months.      Gene Serpe, PA-C  Electronically Signed      Ernestine Mcmurray, MD,FACC  Electronically Signed   GS/MedQ  DD: 03/01/2006  DT: 03/01/2006  Job #: [id number]   cc:   Carin Primrose

## 2010-08-04 ENCOUNTER — Other Ambulatory Visit: Payer: Self-pay | Admitting: *Deleted

## 2010-08-04 MED ORDER — ISOSORBIDE MONONITRATE ER 60 MG PO TB24: ORAL_TABLET | ORAL | Status: DC

## 2010-08-05 ENCOUNTER — Other Ambulatory Visit: Payer: Self-pay | Admitting: *Deleted

## 2010-08-05 MED ORDER — LABETALOL HCL 200 MG PO TABS: 400.0000 mg | ORAL_TABLET | Freq: Two times a day (BID) | ORAL | Status: DC

## 2010-08-19 ENCOUNTER — Encounter: Payer: Self-pay | Admitting: Cardiology

## 2010-08-26 ENCOUNTER — Encounter: Payer: Self-pay | Admitting: Cardiology

## 2010-08-27 ENCOUNTER — Ambulatory Visit (INDEPENDENT_AMBULATORY_CARE_PROVIDER_SITE_OTHER): Payer: PRIVATE HEALTH INSURANCE | Admitting: Cardiology

## 2010-08-27 ENCOUNTER — Encounter: Payer: Self-pay | Admitting: Cardiology

## 2010-08-27 VITALS — BP 141/62 | HR 66 | Resp 20 | Ht 63.0 in | Wt 240.4 lb

## 2010-08-27 DIAGNOSIS — R0609 Other forms of dyspnea: Secondary | ICD-10-CM

## 2010-08-27 DIAGNOSIS — I5032 Chronic diastolic (congestive) heart failure: Secondary | ICD-10-CM

## 2010-08-27 DIAGNOSIS — R0989 Other specified symptoms and signs involving the circulatory and respiratory systems: Secondary | ICD-10-CM

## 2010-08-27 DIAGNOSIS — F319 Bipolar disorder, unspecified: Secondary | ICD-10-CM

## 2010-08-27 DIAGNOSIS — I509 Heart failure, unspecified: Secondary | ICD-10-CM

## 2010-08-27 DIAGNOSIS — I2510 Atherosclerotic heart disease of native coronary artery without angina pectoris: Secondary | ICD-10-CM

## 2010-08-27 MED ORDER — LABETALOL HCL 200 MG PO TABS: 400.0000 mg | ORAL_TABLET | Freq: Two times a day (BID) | ORAL | Status: DC

## 2010-08-27 NOTE — Patient Instructions (Signed)
Continue all current medications. Your physician wants you to follow up in: 6 months.  You will receive a reminder letter in the mail one-two months in advance.  If you don't receive a letter, please call our office to schedule the follow up appointment   

## 2010-08-27 NOTE — Assessment & Plan Note (Signed)
Tolerating psychotropic medications well with no cardiac side effects. Manic episodes are  well controlled. No significant depression.
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
## 2010-08-27 NOTE — Assessment & Plan Note (Signed)
Improved after the patient has lost 12 pounds

## 2010-08-27 NOTE — Assessment & Plan Note (Signed)
No evidence of volume overload. The patient has not required any admissions for heart failure symptoms

## 2010-08-27 NOTE — Assessment & Plan Note (Signed)
No recurrent chest pain continue current medical therapy. No indication for ischemia testing at the present time

## 2010-10-14 LAB — POCT I-STAT 4, (NA,K, GLUC, HGB,HCT)
Operator id: 114531
Sodium: 139

## 2010-10-24 LAB — POCT I-STAT 3, VENOUS BLOOD GAS (G3P V)
O2 Saturation: 63 %
TCO2: 25 mmol/L (ref 0–100)
pCO2, Ven: 44.4 mmHg — ABNORMAL LOW (ref 45.0–50.0)
pH, Ven: 7.335 — ABNORMAL HIGH (ref 7.250–7.300)

## 2010-10-24 LAB — BASIC METABOLIC PANEL
BUN: 48 mg/dL — ABNORMAL HIGH (ref 6–23)
BUN: 50 mg/dL — ABNORMAL HIGH (ref 6–23)
CO2: 24 mEq/L (ref 19–32)
CO2: 24 mEq/L (ref 19–32)
CO2: 28 mEq/L (ref 19–32)
Calcium: 8.8 mg/dL (ref 8.4–10.5)
Chloride: 100 mEq/L (ref 96–112)
Chloride: 103 mEq/L (ref 96–112)
Chloride: 104 mEq/L (ref 96–112)
Chloride: 104 mEq/L (ref 96–112)
Creatinine, Ser: 1.36 mg/dL — ABNORMAL HIGH (ref 0.4–1.2)
Creatinine, Ser: 1.36 mg/dL — ABNORMAL HIGH (ref 0.4–1.2)
GFR calc Af Amer: 39 mL/min — ABNORMAL LOW (ref >60)
GFR calc non Af Amer: 40 mL/min — ABNORMAL LOW (ref >60)
Glucose, Bld: 56 mg/dL — ABNORMAL LOW (ref 70–99)
Glucose, Bld: 79 mg/dL (ref 70–99)
Glucose, Bld: 98 mg/dL (ref 70–99)
Potassium: 4.1 mEq/L (ref 3.5–5.1)
Potassium: 4.5 mEq/L (ref 3.5–5.1)
Potassium: 4.7 mEq/L (ref 3.5–5.1)
Sodium: 136 mEq/L (ref 135–145)
Sodium: 144 mEq/L (ref 135–145)

## 2010-10-24 LAB — COMPREHENSIVE METABOLIC PANEL
ALT: 34 U/L (ref 0–35)
AST: 31 U/L (ref 0–37)
Alkaline Phosphatase: 70 U/L (ref 39–117)
CO2: 24 mEq/L (ref 19–32)
CO2: 27 mEq/L (ref 19–32)
Calcium: 8.6 mg/dL (ref 8.4–10.5)
Calcium: 8.8 mg/dL (ref 8.4–10.5)
Chloride: 100 mEq/L (ref 96–112)
Creatinine, Ser: 1.46 mg/dL — ABNORMAL HIGH (ref 0.4–1.2)
GFR calc Af Amer: 55 mL/min — ABNORMAL LOW (ref >60)
GFR calc non Af Amer: 37 mL/min — ABNORMAL LOW (ref >60)
GFR calc non Af Amer: 46 mL/min — ABNORMAL LOW (ref >60)
Glucose, Bld: 124 mg/dL — ABNORMAL HIGH (ref 70–99)
Glucose, Bld: 260 mg/dL — ABNORMAL HIGH (ref 70–99)
Potassium: 5 mEq/L (ref 3.5–5.1)
Sodium: 132 mEq/L — ABNORMAL LOW (ref 135–145)
Sodium: 138 mEq/L (ref 135–145)
Total Protein: 6.1 g/dL (ref 6.0–8.3)

## 2010-10-24 LAB — HEPARIN LEVEL (UNFRACTIONATED)
Heparin Unfractionated: 0.21 IU/mL — ABNORMAL LOW (ref 0.30–0.70)
Heparin Unfractionated: 0.21 IU/mL — ABNORMAL LOW (ref 0.30–0.70)
Heparin Unfractionated: 0.32 IU/mL (ref 0.30–0.70)
Heparin Unfractionated: <0.1 IU/mL — ABNORMAL LOW (ref 0.30–0.70)
Heparin Unfractionated: <0.1 IU/mL — ABNORMAL LOW (ref 0.30–0.70)

## 2010-10-24 LAB — GLUCOSE, CAPILLARY
Glucose-Capillary: 107 mg/dL — ABNORMAL HIGH (ref 70–99)
Glucose-Capillary: 133 mg/dL — ABNORMAL HIGH (ref 70–99)
Glucose-Capillary: 143 mg/dL — ABNORMAL HIGH (ref 70–99)
Glucose-Capillary: 147 mg/dL — ABNORMAL HIGH (ref 70–99)
Glucose-Capillary: 154 mg/dL — ABNORMAL HIGH (ref 70–99)
Glucose-Capillary: 156 mg/dL — ABNORMAL HIGH (ref 70–99)
Glucose-Capillary: 178 mg/dL — ABNORMAL HIGH (ref 70–99)
Glucose-Capillary: 52 mg/dL — ABNORMAL LOW (ref 70–99)
Glucose-Capillary: 53 mg/dL — ABNORMAL LOW (ref 70–99)
Glucose-Capillary: 85 mg/dL (ref 70–99)
Glucose-Capillary: 91 mg/dL (ref 70–99)
Glucose-Capillary: 97 mg/dL (ref 70–99)

## 2010-10-24 LAB — CBC
HCT: 29 % — ABNORMAL LOW (ref 36.0–46.0)
HCT: 31.5 % — ABNORMAL LOW (ref 36.0–46.0)
Hemoglobin: 10.4 g/dL — ABNORMAL LOW (ref 12.0–15.0)
Hemoglobin: 10.7 g/dL — ABNORMAL LOW (ref 12.0–15.0)
Hemoglobin: 11.8 g/dL — ABNORMAL LOW (ref 12.0–15.0)
Hemoglobin: 9.6 g/dL — ABNORMAL LOW (ref 12.0–15.0)
MCHC: 33.4 g/dL (ref 30.0–36.0)
MCHC: 33.5 g/dL (ref 30.0–36.0)
MCHC: 33.8 g/dL (ref 30.0–36.0)
MCHC: 33.9 g/dL (ref 30.0–36.0)
MCHC: 34.7 g/dL (ref 30.0–36.0)
MCV: 90.3 fL (ref 78.0–100.0)
MCV: 90.6 fL (ref 78.0–100.0)
MCV: 90.8 fL (ref 78.0–100.0)
MCV: 91.3 fL (ref 78.0–100.0)
MCV: 91.4 fL (ref 78.0–100.0)
Platelets: 166 10*3/uL (ref 150–400)
Platelets: 179 10*3/uL (ref 150–400)
Platelets: 207 10*3/uL (ref 150–400)
RBC: 3.14 MIL/uL — ABNORMAL LOW (ref 3.87–5.11)
RBC: 3.25 MIL/uL — ABNORMAL LOW (ref 3.87–5.11)
RBC: 3.35 MIL/uL — ABNORMAL LOW (ref 3.87–5.11)
RBC: 3.85 MIL/uL — ABNORMAL LOW (ref 3.87–5.11)
RDW: 12.7 % (ref 11.5–15.5)
RDW: 12.9 % (ref 11.5–15.5)
RDW: 12.9 % (ref 11.5–15.5)
RDW: 13 % (ref 11.5–15.5)
RDW: 13.1 % (ref 11.5–15.5)
WBC: 10.8 10*3/uL — ABNORMAL HIGH (ref 4.0–10.5)
WBC: 11.1 10*3/uL — ABNORMAL HIGH (ref 4.0–10.5)
WBC: 9.9 10*3/uL (ref 4.0–10.5)

## 2010-10-24 LAB — CARDIAC PANEL(CRET KIN+CKTOT+MB+TROPI)
CK, MB: 7.5 ng/mL — ABNORMAL HIGH (ref 0.3–4.0)
CK, MB: 7.8 ng/mL — ABNORMAL HIGH (ref 0.3–4.0)
CK, MB: 8.6 ng/mL — ABNORMAL HIGH (ref 0.3–4.0)
Relative Index: 6 — ABNORMAL HIGH (ref 0.0–2.5)
Total CK: 126 U/L (ref 7–177)
Total CK: 154 U/L (ref 7–177)
Total CK: 160 U/L (ref 7–177)
Troponin I: 1.17 ng/mL (ref 0.00–0.06)
Troponin I: 1.96 ng/mL (ref 0.00–0.06)

## 2010-10-24 LAB — LIPID PANEL
Cholesterol: 213 mg/dL — ABNORMAL HIGH (ref 0–200)
Total CHOL/HDL Ratio: 5.1 RATIO
VLDL: 54 mg/dL — ABNORMAL HIGH (ref 0–40)

## 2010-10-24 LAB — POCT I-STAT 3, ART BLOOD GAS (G3+)
Acid-base deficit: 1 mmol/L (ref 0.0–2.0)
O2 Saturation: 95 %
TCO2: 25 mmol/L (ref 0–100)

## 2010-10-24 LAB — HEMOGLOBIN A1C: Mean Plasma Glucose: 151 mg/dL

## 2010-10-24 LAB — TSH: TSH: 3.481 u[IU]/mL (ref 0.350–4.500)

## 2010-10-29 ENCOUNTER — Encounter (INDEPENDENT_AMBULATORY_CARE_PROVIDER_SITE_OTHER): Payer: PRIVATE HEALTH INSURANCE | Admitting: Ophthalmology

## 2010-10-29 DIAGNOSIS — H43819 Vitreous degeneration, unspecified eye: Secondary | ICD-10-CM

## 2010-10-29 DIAGNOSIS — E11319 Type 2 diabetes mellitus with unspecified diabetic retinopathy without macular edema: Secondary | ICD-10-CM

## 2010-10-29 DIAGNOSIS — H2510 Age-related nuclear cataract, unspecified eye: Secondary | ICD-10-CM

## 2010-10-29 DIAGNOSIS — H35039 Hypertensive retinopathy, unspecified eye: Secondary | ICD-10-CM

## 2010-10-30 LAB — BASIC METABOLIC PANEL
GFR calc non Af Amer: 56 — ABNORMAL LOW
Potassium: 4.6
Sodium: 137

## 2010-10-30 LAB — POCT I-STAT 4, (NA,K, GLUC, HGB,HCT)
HCT: 37
Hemoglobin: 12.6

## 2010-12-14 ENCOUNTER — Other Ambulatory Visit: Payer: Self-pay | Admitting: Cardiology

## 2010-12-31 DIAGNOSIS — R079 Chest pain, unspecified: Secondary | ICD-10-CM

## 2011-01-01 ENCOUNTER — Encounter: Payer: Self-pay | Admitting: Cardiology

## 2011-01-19 ENCOUNTER — Ambulatory Visit (INDEPENDENT_AMBULATORY_CARE_PROVIDER_SITE_OTHER): Payer: PRIVATE HEALTH INSURANCE | Admitting: Cardiology

## 2011-01-19 ENCOUNTER — Encounter: Payer: Self-pay | Admitting: Cardiology

## 2011-01-19 VITALS — BP 129/68 | HR 63 | Ht 63.0 in | Wt 248.0 lb

## 2011-01-19 DIAGNOSIS — I2510 Atherosclerotic heart disease of native coronary artery without angina pectoris: Secondary | ICD-10-CM

## 2011-01-19 DIAGNOSIS — R0789 Other chest pain: Secondary | ICD-10-CM

## 2011-01-19 DIAGNOSIS — Z79899 Other long term (current) drug therapy: Secondary | ICD-10-CM

## 2011-01-19 MED ORDER — FUROSEMIDE 80 MG PO TABS: 80.0000 mg | ORAL_TABLET | Freq: Two times a day (BID) | ORAL | Status: DC

## 2011-01-19 MED ORDER — ROSUVASTATIN CALCIUM 20 MG PO TABS: 10.0000 mg | ORAL_TABLET | Freq: Every day | ORAL | Status: DC

## 2011-01-19 NOTE — Patient Instructions (Signed)
   Refills sent in for Crestor & Furosemide Your physician recommends that you go to the [facility] for lab work for [place] & LFT. If the results of your test are normal or stable, you will receive a letter.  If they are abnormal, the nurse will contact you by phone. Your physician wants you to follow up in: 6 months.  You will receive a reminder letter in the mail one-two months in advance.  If you don't receive a letter, please call our office to schedule the follow up appointment

## 2011-01-20 ENCOUNTER — Encounter: Payer: Self-pay | Admitting: Cardiology

## 2011-01-20 DIAGNOSIS — R0789 Other chest pain: Secondary | ICD-10-CM | POA: Insufficient documentation

## 2011-01-20 NOTE — Progress Notes (Signed)
Christy Real, MD, [facility] Board Certified in Adult Cardiovascular Medicine,Internal Medicine and Critical Care Medicine    CC: followup after recent hospitalization for atypical chest pain  HPI: The patient is a 61 year old female recently admitted with atypical chest pain ruled out for myocardial infarction with negative enzymes.  She has known coronary artery disease.  She now reports no recurrent chest pain.  He is stable from a cardiac perspective.  She has had some problems with reflux and her Protonix was renewed and seemed to improve her symptoms.  She has rare palpitations.  She reports no orthopnea or PND.  She remains physically active.   PMH: reviewed and listed in Problem List in Electronic Records (and see below) Past Medical History  Diagnosis Date  . SVT (supraventricular tachycardia)     hx; a long-acting diltiazem   . CAD (coronary artery disease)     single vesel status post open marginal branch 2006, patent stent in December 2009  . Renal tubular acidosis, type IV   . Dyslipidemia   . DM (diabetes mellitus)     insulin dependant   . HTN (hypertension)   . Morbid obesity   . OSA on CPAP   . Bipolar disorder   . Atypical chest pain     admitted January 01, 2011 ruled out for myocardial infarction.  No further ischemia workup.   Past Surgical History  Procedure Date  . Cardiac cath monitor 1/08    neg; nml L ventricular EF   . Stent in om1 3/06  . Adenosine cardio lyte     neg; EF 57% 2/07  . Nm myoview ltd 2011    neg ischemia; by primary care physician   . Rt shoulder adhesive capsulitis   . Rotator cuff impingement syndrome   . Rotator cuff tear   . Acromioclavicular arthritis     Allergies/SH/FHX : available in Electronic Records for review  Allergies  Allergen Reactions  . Ace Inhibitors   . Penicillins   . Tetracycline   . Lamictal (Lamotrigine) Rash    Rash start inside to outter body.   History   Social History  . Marital Status:  Divorced    Spouse Name: N/A    Number of Children: N/A  . Years of Education: N/A   Occupational History  . [place] History Main Topics  . Smoking status: Current Some Day Smoker -- 1.0 packs/day for 20 years    Types: Cigarettes  . Smokeless tobacco: Never Used   Comment: smoked cigarettes on and off  . Alcohol Use: No  . Drug Use: No  . Sexually Active: Not on file   Other Topics Concern  . Not on file   Social History Narrative   She has lived with some of her daughters. Works part-time for the city.    Family History  Problem Relation Age of Onset  . Bipolar disorder Brother   . Diabetes Father     back surgery     Medications: Current Outpatient Prescriptions  Medication Sig Dispense Refill  . allopurinol (ZYLOPRIM) 100 MG tablet Take 100 mg by mouth daily.        Marland Kitchen aspirin 81 MG EC tablet Take 81 mg by mouth daily.        Marland Kitchen buPROPion (WELLBUTRIN XL) 300 MG 24 hr tablet Take 300 mg by mouth daily.        . calcium carbonate (OS-CAL) 600 MG  TABS Take 600 mg by mouth daily.        . Cholecalciferol (VITAMIN D) 400 UNITS capsule Take 400 Units by mouth daily.        Marland Kitchen diltiazem (CARDIZEM) 120 MG tablet Take 120 mg by mouth daily.        . divalproex (DEPAKOTE) 250 MG EC tablet Take 250 mg by mouth daily.        . furosemide (LASIX) 80 MG tablet Take 1 tablet (80 mg total) by mouth 2 (two) times daily.  60 tablet  6  . gabapentin (NEURONTIN) 100 MG capsule Take 100 mg by mouth 2 (two) times daily.        . insulin aspart (NOVOLOG) 100 UNIT/ML injection Inject into the skin 2 (two) times daily.        . insulin detemir (LEVEMIR) 100 UNIT/ML injection Inject into the skin as directed.        . isosorbide mononitrate (IMDUR) 60 MG 24 hr tablet Take 1&1/2 by mouth daily   30 tablet  6  . labetalol (NORMODYNE) 200 MG tablet Take 2 tablets (400 mg total) by mouth 2 (two) times daily.  120 tablet  6  . Liraglutide (VICTOZA) 18 MG/3ML SOLN  Inject into the skin daily.        . metFORMIN (GLUCOPHAGE) 500 MG tablet Take 500 mg by mouth 2 (two) times daily.        . Multiple Vitamin (MULTIVITAMIN) tablet Take 1 tablet by mouth daily.        . nitroGLYCERIN (NITROLINGUAL) 0.4 MG/SPRAY spray Place 1 spray under the tongue every 5 (five) minutes as needed.        . Omega-3 Fatty Acids (FISH OIL) 1000 MG CAPS Take 1 capsule by mouth 2 (two) times daily.        . pantoprazole (PROTONIX) 40 MG tablet Take 40 mg by mouth daily.        . rosuvastatin (CRESTOR) 20 MG tablet Take 0.5 tablets (10 mg total) by mouth at bedtime.  15 tablet  6  . sertraline (ZOLOFT) 100 MG tablet Take 100 mg by mouth daily.        . traZODone (DESYREL) 50 MG tablet Take 50 mg by mouth daily.          ROS: No nausea or vomiting. No fever or chills.No melena or hematochezia.No bleeding.No claudication  Physical Exam: BP 129/68  Pulse 63  Ht 5\' 3"  (1.6 m)  Wt 248 lb (112.492 kg)  BMI 43.93 kg/m2 General:well-nourished white female in no apparent distress Neck:normal carotid upstroke and no carotid bruits.  JVP is 5 cm no thyromegaly, no thyroid Lungs:clear breath sounds bilaterally without wheezing Cardiac:regular rate and rhythm with normal S1 and S2 no murmur or gallops Vascular:no edema.  Normal distal pulses Skin:warm and dry Physcologic:normal affect  12lead ECG:not performed Limited bedside ECHO:N/A   Patient Active Problem List  Diagnoses  . BIPOLAR DISORDER UNSPECIFIED  . OBSTRUCTIVE SLEEP APNEA  . CORONARY ATHEROSCLEROSIS NATIVE CORONARY ARTERY Status post stent obtuse marginal 2006 with patent stents and nonobstructive coronary artery disease December 2009, preserved LV function  . CHRONIC DIASTOLIC HEART FAILURE  . LUMBAGO  . DYSPNEA ON EXERTION  . PERCUTANEOUS TRANSLUMINAL CORONARY ANGIOPLASTY, HX OF  . Atypical chest pain -ruled out for myocardial infarction January 01, 2011    PLAN   The patient is doing well from a  cardiovascular perspective she has no recurrent chest pain.  No indication for further  ischemia testing  Continue risk factor modification and I counseled the patient regarding weight reduction.  The patient appears to have recent reflux symptoms and is taking Protonix with improvement in her symptoms.  The patient is taking a statin and lipid panel can be monitored by her primary care physician.

## 2011-03-03 ENCOUNTER — Encounter: Payer: Self-pay | Admitting: *Deleted

## 2011-04-06 ENCOUNTER — Other Ambulatory Visit: Payer: Self-pay | Admitting: *Deleted

## 2011-04-06 MED ORDER — ISOSORBIDE MONONITRATE ER 60 MG PO TB24: ORAL_TABLET | ORAL | Status: DC

## 2011-04-07 ENCOUNTER — Other Ambulatory Visit: Payer: Self-pay | Admitting: Cardiology

## 2011-08-03 ENCOUNTER — Encounter (INDEPENDENT_AMBULATORY_CARE_PROVIDER_SITE_OTHER): Payer: PRIVATE HEALTH INSURANCE | Admitting: Ophthalmology

## 2011-08-24 ENCOUNTER — Encounter (INDEPENDENT_AMBULATORY_CARE_PROVIDER_SITE_OTHER): Payer: PRIVATE HEALTH INSURANCE | Admitting: Ophthalmology

## 2011-08-24 DIAGNOSIS — E11359 Type 2 diabetes mellitus with proliferative diabetic retinopathy without macular edema: Secondary | ICD-10-CM

## 2011-08-24 DIAGNOSIS — H43819 Vitreous degeneration, unspecified eye: Secondary | ICD-10-CM

## 2011-08-24 DIAGNOSIS — I10 Essential (primary) hypertension: Secondary | ICD-10-CM

## 2011-08-24 DIAGNOSIS — H35039 Hypertensive retinopathy, unspecified eye: Secondary | ICD-10-CM

## 2011-08-24 DIAGNOSIS — H3581 Retinal edema: Secondary | ICD-10-CM

## 2011-08-24 DIAGNOSIS — E1139 Type 2 diabetes mellitus with other diabetic ophthalmic complication: Secondary | ICD-10-CM

## 2011-08-29 ENCOUNTER — Other Ambulatory Visit: Payer: Self-pay | Admitting: Cardiology

## 2011-09-09 ENCOUNTER — Ambulatory Visit (INDEPENDENT_AMBULATORY_CARE_PROVIDER_SITE_OTHER): Payer: PRIVATE HEALTH INSURANCE | Admitting: Ophthalmology

## 2011-09-09 DIAGNOSIS — E1139 Type 2 diabetes mellitus with other diabetic ophthalmic complication: Secondary | ICD-10-CM

## 2011-09-09 DIAGNOSIS — H3581 Retinal edema: Secondary | ICD-10-CM

## 2011-11-13 ENCOUNTER — Ambulatory Visit: Payer: PRIVATE HEALTH INSURANCE | Admitting: Cardiology

## 2011-12-08 ENCOUNTER — Other Ambulatory Visit: Payer: Self-pay | Admitting: Cardiology

## 2012-01-05 ENCOUNTER — Encounter: Payer: Self-pay | Admitting: Cardiology

## 2012-01-06 ENCOUNTER — Ambulatory Visit (INDEPENDENT_AMBULATORY_CARE_PROVIDER_SITE_OTHER): Payer: PRIVATE HEALTH INSURANCE | Admitting: Cardiology

## 2012-01-06 ENCOUNTER — Encounter: Payer: Self-pay | Admitting: Cardiology

## 2012-01-06 VITALS — BP 129/54 | HR 89 | Wt 243.0 lb

## 2012-01-06 DIAGNOSIS — I2510 Atherosclerotic heart disease of native coronary artery without angina pectoris: Secondary | ICD-10-CM

## 2012-01-06 DIAGNOSIS — E782 Mixed hyperlipidemia: Secondary | ICD-10-CM

## 2012-01-06 DIAGNOSIS — I471 Supraventricular tachycardia: Secondary | ICD-10-CM | POA: Insufficient documentation

## 2012-01-06 DIAGNOSIS — I498 Other specified cardiac arrhythmias: Secondary | ICD-10-CM

## 2012-01-06 MED ORDER — LABETALOL HCL 200 MG PO TABS: 400.0000 mg | ORAL_TABLET | Freq: Two times a day (BID) | ORAL | Status: DC

## 2012-01-06 MED ORDER — ISOSORBIDE MONONITRATE ER 60 MG PO TB24: ORAL_TABLET | ORAL | Status: DC

## 2012-01-06 MED ORDER — FUROSEMIDE 80 MG PO TABS: 80.0000 mg | ORAL_TABLET | Freq: Two times a day (BID) | ORAL | Status: DC

## 2012-01-06 NOTE — Assessment & Plan Note (Signed)
Continue statin therapy, followed by Dr. Manuella Ghazi. LDL should be close to 70 if possible.

## 2012-01-06 NOTE — Progress Notes (Signed)
Clinical Summary Christy [name] is a 61 y.o.female presenting for followup. She is a former patient of Dr. Dannielle [name], prefers to stay with [facility]. She was seen in December 2012. 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  ECG today shows probable sinus rhythm with prolonged PR, frequent PACs, and some Wenckebach conduction, PRWP, NSST changes. She reports no significant palpitations, has had no angina symptoms, no hospitalizations. She reports compliance with her medications. Needs refills for labetalol, Imdur, and Lasix.  She states that she follows with Dr. Manuella [name] every 3 months, has lipids obtained through their practice.   Allergies  Allergen Reactions  . Ace Inhibitors   . Penicillins   . Tetracycline   . Lamictal (Lamotrigine) Rash    Rash start inside to outter body.    Current Outpatient Prescriptions  Medication Sig Dispense Refill  . allopurinol (ZYLOPRIM) 100 MG tablet Take 150 mg by mouth daily.       Marland Kitchen aspirin 81 MG EC tablet Take 81 mg by mouth daily.        Marland Kitchen buPROPion (WELLBUTRIN XL) 300 MG 24 hr tablet Take 300 mg by mouth daily.        . calcium carbonate (OS-CAL) 600 MG TABS Take 600 mg by mouth daily.        . Cholecalciferol (VITAMIN D) 400 UNITS capsule Take 400 Units by mouth daily.        Marland Kitchen diltiazem (CARDIZEM) 120 MG tablet Take 120 mg by mouth daily.        . divalproex (DEPAKOTE) 250 MG EC tablet Take 250 mg by mouth daily.        . furosemide (LASIX) 80 MG tablet Take 1 tablet (80 mg total) by mouth 2 (two) times daily.  60 tablet  6  . gabapentin (NEURONTIN) 100 MG capsule Take 100 mg by mouth 2 (two) times daily.        . insulin aspart (NOVOLOG) 100 UNIT/ML injection Inject into the skin 2 (two) times daily.       . insulin detemir (LEVEMIR) 100 UNIT/ML injection Inject into the skin as directed.       . isosorbide mononitrate (IMDUR) 60 MG 24 hr tablet Take 1&1/2 tablets by mouth  daily  45 tablet  6  . labetalol (NORMODYNE) 200 MG tablet Take 2 tablets (400 mg total) by mouth 2 (two) times daily.  120 tablet  6  . Liraglutide (VICTOZA) 18 MG/3ML SOLN Inject into the skin daily.        . metFORMIN (GLUCOPHAGE) 500 MG tablet Take 500 mg by mouth 2 (two) times daily.        . Multiple Vitamin (MULTIVITAMIN) tablet Take 1 tablet by mouth daily.        . nitroGLYCERIN (NITROLINGUAL) 0.4 MG/SPRAY spray Place 1 spray under the tongue every 5 (five) minutes as needed.        . Omega-3 Fatty Acids (FISH OIL) 1000 MG CAPS Take 1 capsule by mouth 2 (two) times daily.        . pantoprazole (PROTONIX) 40 MG tablet Take 40 mg by mouth daily.        . rosuvastatin (CRESTOR) 20 MG tablet Take 0.5 tablets (10 mg total) by mouth at bedtime.  15 tablet  6  . sertraline (ZOLOFT) 100 MG tablet Take 100 mg by mouth daily.        . traZODone (DESYREL) 50  MG tablet Take 50 mg by mouth daily.          Past Medical History  Diagnosis Date  . SVT (supraventricular tachycardia)   . Coronary atherosclerosis of native coronary artery     BMS circ 2006, 70% PDA 2009  . Renal tubular acidosis, type IV   . Mixed hyperlipidemia   . Type 2 diabetes mellitus   . Essential hypertension, benign   . OSA on CPAP   . Bipolar disorder     Social History Christy [name] reports that she has been smoking Cigarettes.  She has a 20 pack-year smoking history. She has never used smokeless tobacco. Christy [name] reports that she does not drink alcohol.  Review of Systems Reports chronic problems with back pain, limits her regular activities. No bleeding problems. Otherwise negative.  Physical Examination Filed Vitals:   01/06/12 1325  BP: 129/54  Pulse: 89   Filed Weights   01/06/12 1325  Weight: 243 lb (110.224 kg)   No acute distress. HEENT: Conjunctiva and lids normal, oropharynx clear. Neck: Supple, no elevated JVP or carotid bruits, no thyromegaly. Lungs: Clear to auscultation, nonlabored breathing at  rest. Cardiac: Regular rate and rhythm with frequent ectopy, no S3 or significant systolic murmur, no pericardial rub. Abdomen: Soft, nontender, bowel sounds present, no guarding or rebound. Extremities: No pitting edema, distal pulses 2+. Skin: Warm and dry. Musculoskeletal: No kyphosis. Neuropsychiatric: Alert and oriented x3, affect grossly appropriate.   Problem List and Plan   CORONARY ATHEROSCLEROSIS NATIVE CORONARY ARTERY Symptomatically stable on medical therapy. ECG reviewed. Refills provided as noted above. Will continue observation, office followup arranged.  SVT (supraventricular tachycardia) Frequent atrial ectopy noted by ECG, however she reports no palpitations. Continue calcium channel blocker and beta blocker.  Mixed hyperlipidemia Continue statin therapy, followed by Dr. Manuella [name]. LDL should be close to 70 if possible.    Satira Sark, M.D., F.A.C.C.

## 2012-01-06 NOTE — Assessment & Plan Note (Signed)
Symptomatically stable on medical therapy. ECG reviewed. Refills provided as noted above. Will continue observation, office followup arranged.

## 2012-01-06 NOTE — Patient Instructions (Addendum)
Your physician recommends that you schedule a follow-up appointment in: 9 month. You will receive a reminder letter in the mail in about 7-8 months reminding you to call and schedule your appointment. If you don't receive this letter, please contact our office.  Your physician recommends that you continue on your current medications as directed. Please refer to the Current Medication list given to you today.

## 2012-01-06 NOTE — Assessment & Plan Note (Signed)
Frequent atrial ectopy noted by ECG, however she reports no palpitations. Continue calcium channel blocker and beta blocker.

## 2012-01-11 ENCOUNTER — Ambulatory Visit (INDEPENDENT_AMBULATORY_CARE_PROVIDER_SITE_OTHER): Payer: PRIVATE HEALTH INSURANCE | Admitting: Ophthalmology

## 2012-01-11 DIAGNOSIS — E11319 Type 2 diabetes mellitus with unspecified diabetic retinopathy without macular edema: Secondary | ICD-10-CM

## 2012-01-11 DIAGNOSIS — E1139 Type 2 diabetes mellitus with other diabetic ophthalmic complication: Secondary | ICD-10-CM

## 2012-01-11 DIAGNOSIS — H2510 Age-related nuclear cataract, unspecified eye: Secondary | ICD-10-CM

## 2012-01-11 DIAGNOSIS — H43819 Vitreous degeneration, unspecified eye: Secondary | ICD-10-CM

## 2012-01-11 DIAGNOSIS — H35039 Hypertensive retinopathy, unspecified eye: Secondary | ICD-10-CM

## 2012-01-11 DIAGNOSIS — I10 Essential (primary) hypertension: Secondary | ICD-10-CM

## 2012-07-11 ENCOUNTER — Ambulatory Visit (INDEPENDENT_AMBULATORY_CARE_PROVIDER_SITE_OTHER): Payer: PRIVATE HEALTH INSURANCE | Admitting: Ophthalmology

## 2012-07-20 ENCOUNTER — Ambulatory Visit (INDEPENDENT_AMBULATORY_CARE_PROVIDER_SITE_OTHER): Payer: Self-pay | Admitting: Ophthalmology

## 2012-07-20 ENCOUNTER — Ambulatory Visit (INDEPENDENT_AMBULATORY_CARE_PROVIDER_SITE_OTHER): Payer: PRIVATE HEALTH INSURANCE | Admitting: Ophthalmology

## 2012-07-20 DIAGNOSIS — I10 Essential (primary) hypertension: Secondary | ICD-10-CM

## 2012-07-20 DIAGNOSIS — H43819 Vitreous degeneration, unspecified eye: Secondary | ICD-10-CM

## 2012-07-20 DIAGNOSIS — H2510 Age-related nuclear cataract, unspecified eye: Secondary | ICD-10-CM

## 2012-07-20 DIAGNOSIS — E11319 Type 2 diabetes mellitus with unspecified diabetic retinopathy without macular edema: Secondary | ICD-10-CM

## 2012-07-20 DIAGNOSIS — E1139 Type 2 diabetes mellitus with other diabetic ophthalmic complication: Secondary | ICD-10-CM

## 2012-07-20 DIAGNOSIS — H35039 Hypertensive retinopathy, unspecified eye: Secondary | ICD-10-CM

## 2012-08-09 ENCOUNTER — Other Ambulatory Visit: Payer: Self-pay | Admitting: Cardiology

## 2012-08-09 NOTE — Telephone Encounter (Signed)
Medication sent via escribe for lasix, Imdur, and Labetalol.

## 2012-10-24 ENCOUNTER — Encounter: Payer: Self-pay | Admitting: Cardiology

## 2012-10-24 ENCOUNTER — Ambulatory Visit (INDEPENDENT_AMBULATORY_CARE_PROVIDER_SITE_OTHER): Payer: PRIVATE HEALTH INSURANCE | Admitting: Cardiology

## 2012-10-24 VITALS — BP 143/59 | HR 58 | Ht 63.0 in | Wt 248.8 lb

## 2012-10-24 DIAGNOSIS — I2510 Atherosclerotic heart disease of native coronary artery without angina pectoris: Secondary | ICD-10-CM

## 2012-10-24 DIAGNOSIS — E782 Mixed hyperlipidemia: Secondary | ICD-10-CM

## 2012-10-24 DIAGNOSIS — I471 Supraventricular tachycardia: Secondary | ICD-10-CM

## 2012-10-24 DIAGNOSIS — I498 Other specified cardiac arrhythmias: Secondary | ICD-10-CM

## 2012-10-24 NOTE — Assessment & Plan Note (Signed)
Symptomatically stable on medical therapy. We discussed diet and exercise. Otherwise continue observation.

## 2012-10-24 NOTE — Progress Notes (Signed)
Clinical Summary Christy [name] is a 62 y.o.female last seen in December 2013. She reports no angina symptoms, no significant palpitations. She quit smoking a few months ago, has had no major relapses. We did discuss a walking regimen, she has not been consistent with exercise.  Medications reviewed and stable overall. She reports compliance. Lipid followup with Dr. Manuella Ghazi.    Allergies  Allergen Reactions  . Ace Inhibitors   . Penicillins   . Tetracycline   . Lamictal [Lamotrigine] Rash    Rash start inside to outter body.    Current Outpatient Prescriptions  Medication Sig Dispense Refill  . allopurinol (ZYLOPRIM) 100 MG tablet Take 150 mg by mouth daily.       Marland Kitchen aspirin 81 MG EC tablet Take 81 mg by mouth daily.        Marland Kitchen buPROPion (WELLBUTRIN XL) 300 MG 24 hr tablet Take 300 mg by mouth daily.        . calcium carbonate (OS-CAL) 600 MG TABS Take 600 mg by mouth daily.        . Cholecalciferol (VITAMIN D) 400 UNITS capsule Take 400 Units by mouth daily.        Marland Kitchen diltiazem (CARDIZEM) 120 MG tablet Take 120 mg by mouth daily.        . divalproex (DEPAKOTE) 250 MG EC tablet Take 250 mg by mouth daily.        . furosemide (LASIX) 80 MG tablet TAKE 1 TABLET BY MOUTH TWICE DAILY  60 tablet  6  . gabapentin (NEURONTIN) 100 MG capsule Take 100 mg by mouth 2 (two) times daily.        . insulin aspart (NOVOLOG) 100 UNIT/ML injection Inject into the skin. Sliding scale 5 - 45 units as needed up to four times a day      . insulin detemir (LEVEMIR) 100 UNIT/ML injection 100 units every morning & 110 units every evening      . isosorbide mononitrate (IMDUR) 60 MG 24 hr tablet TAKE 1 & 1/2 TABLET BY MOUTH EVERY DAY  45 tablet  6  . labetalol (NORMODYNE) 200 MG tablet TAKE 2 TABLETS BY MOUTH TWICE DAILY  120 tablet  6  . Liraglutide (VICTOZA) 18 MG/3ML SOLN Inject 1.8 mg into the skin daily.       . Multiple Vitamin (MULTIVITAMIN) tablet Take 1 tablet by mouth daily.        . Omega-3 Fatty Acids  (FISH OIL) 1000 MG CAPS Take 1 capsule by mouth 2 (two) times daily.        . pantoprazole (PROTONIX) 40 MG tablet Take 40 mg by mouth daily.        . rosuvastatin (CRESTOR) 10 MG tablet Take 10 mg by mouth daily.      . sertraline (ZOLOFT) 100 MG tablet Take 100 mg by mouth daily.        . traZODone (DESYREL) 50 MG tablet Take 50 mg by mouth daily.        . nitroGLYCERIN (NITROLINGUAL) 0.4 MG/SPRAY spray Place 1 spray under the tongue every 5 (five) minutes as needed.         No current facility-administered medications for this visit.    Past Medical History  Diagnosis Date  . SVT (supraventricular tachycardia)   . Coronary atherosclerosis of native coronary artery     BMS circ 2006, 70% PDA 2009  . Renal tubular acidosis, type IV   . Mixed hyperlipidemia   . Type  2 diabetes mellitus   . Essential hypertension, benign   . OSA on CPAP   . Bipolar disorder     Social History Christy [name] reports that she has quit smoking. Her smoking use included Cigarettes. She started smoking about 7 weeks ago. She has a 20 pack-year smoking history. She has never used smokeless tobacco. Christy [name] reports that she does not drink alcohol.  Review of Systems Negative except as outlined.   Physical Examination Filed Vitals:   10/24/12 1411  BP: 143/59  Pulse: 58   Filed Weights   10/24/12 1411  Weight: 248 lb 12.8 oz (112.855 kg)    No acute distress.  HEENT: Conjunctiva and lids normal, oropharynx clear.  Neck: Supple, no elevated JVP or carotid bruits, no thyromegaly.  Lungs: Clear to auscultation, nonlabored breathing at rest.  Cardiac: Regular rate and rhythm with frequent ectopy, no S3 or significant systolic murmur, no pericardial rub.  Abdomen: Soft, nontender, bowel sounds present, no guarding or rebound.  Extremities: No pitting edema, distal pulses 2+.    Problem List and Plan   CORONARY ATHEROSCLEROSIS NATIVE CORONARY ARTERY Symptomatically stable on medical therapy. We  discussed diet and exercise. Otherwise continue observation.  SVT (supraventricular tachycardia) No recurrent palpitations.  Mixed hyperlipidemia Continues on statin therapy, followed by Dr. Manuella Ghazi.    Satira Sark, M.D., F.A.C.C.

## 2012-10-24 NOTE — Assessment & Plan Note (Signed)
No recurrent palpitations.

## 2012-10-24 NOTE — Patient Instructions (Addendum)

## 2012-10-24 NOTE — Assessment & Plan Note (Signed)
Continues on statin therapy, followed by Dr. Manuella Ghazi.

## 2012-11-24 ENCOUNTER — Other Ambulatory Visit: Payer: Self-pay

## 2012-12-27 ENCOUNTER — Encounter (INDEPENDENT_AMBULATORY_CARE_PROVIDER_SITE_OTHER): Payer: PRIVATE HEALTH INSURANCE

## 2013-01-30 ENCOUNTER — Ambulatory Visit (INDEPENDENT_AMBULATORY_CARE_PROVIDER_SITE_OTHER): Payer: PRIVATE HEALTH INSURANCE | Admitting: Ophthalmology

## 2013-01-30 DIAGNOSIS — I10 Essential (primary) hypertension: Secondary | ICD-10-CM

## 2013-01-30 DIAGNOSIS — E1165 Type 2 diabetes mellitus with hyperglycemia: Secondary | ICD-10-CM

## 2013-01-30 DIAGNOSIS — E11319 Type 2 diabetes mellitus with unspecified diabetic retinopathy without macular edema: Secondary | ICD-10-CM

## 2013-01-30 DIAGNOSIS — E1139 Type 2 diabetes mellitus with other diabetic ophthalmic complication: Secondary | ICD-10-CM

## 2013-01-30 DIAGNOSIS — H2510 Age-related nuclear cataract, unspecified eye: Secondary | ICD-10-CM

## 2013-01-30 DIAGNOSIS — H43819 Vitreous degeneration, unspecified eye: Secondary | ICD-10-CM

## 2013-01-30 DIAGNOSIS — H35039 Hypertensive retinopathy, unspecified eye: Secondary | ICD-10-CM

## 2013-03-11 ENCOUNTER — Other Ambulatory Visit: Payer: Self-pay | Admitting: Cardiology

## 2013-04-08 ENCOUNTER — Other Ambulatory Visit: Payer: Self-pay | Admitting: Cardiology

## 2013-05-10 ENCOUNTER — Other Ambulatory Visit: Payer: Self-pay | Admitting: Cardiology

## 2013-05-15 ENCOUNTER — Other Ambulatory Visit: Payer: Self-pay | Admitting: Cardiology

## 2013-05-16 ENCOUNTER — Other Ambulatory Visit: Payer: Self-pay | Admitting: Cardiology

## 2013-06-06 ENCOUNTER — Encounter: Payer: Self-pay | Admitting: Cardiology

## 2013-06-06 ENCOUNTER — Ambulatory Visit (INDEPENDENT_AMBULATORY_CARE_PROVIDER_SITE_OTHER): Payer: PRIVATE HEALTH INSURANCE | Admitting: Cardiology

## 2013-06-06 VITALS — BP 148/74 | HR 62 | Ht 63.0 in | Wt 233.1 lb

## 2013-06-06 DIAGNOSIS — I2510 Atherosclerotic heart disease of native coronary artery without angina pectoris: Secondary | ICD-10-CM

## 2013-06-06 DIAGNOSIS — E782 Mixed hyperlipidemia: Secondary | ICD-10-CM

## 2013-06-06 DIAGNOSIS — I498 Other specified cardiac arrhythmias: Secondary | ICD-10-CM

## 2013-06-06 DIAGNOSIS — I471 Supraventricular tachycardia: Secondary | ICD-10-CM

## 2013-06-06 MED ORDER — FUROSEMIDE 80 MG PO TABS: 80.0000 mg | ORAL_TABLET | Freq: Two times a day (BID) | ORAL | Status: DC

## 2013-06-06 MED ORDER — LABETALOL HCL 200 MG PO TABS: 400.0000 mg | ORAL_TABLET | Freq: Two times a day (BID) | ORAL | Status: DC

## 2013-06-06 MED ORDER — ISOSORBIDE MONONITRATE ER 60 MG PO TB24: 90.0000 mg | ORAL_TABLET | Freq: Every day | ORAL | Status: DC

## 2013-06-06 NOTE — Assessment & Plan Note (Signed)
Quiescent. Continue calcium channel blocker and beta blocker.

## 2013-06-06 NOTE — Progress Notes (Signed)
Clinical Summary Ms. Jeong is a 63 y.o.female last seen in October 2014. She reports no angina symptoms or significant palpitations. 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 We talked about a walking regimen for exercise.  ECG today shows sinus rhythm with rightward axis, decreased anterior R wave progression.  She continues to follow with Dr. Manuella Ghazi.   Allergies  Allergen Reactions  . Ace Inhibitors   . Penicillins   . Tetracycline   . Lamictal [Lamotrigine] Rash    Rash start inside to outter body.    Current Outpatient Prescriptions  Medication Sig Dispense Refill  . allopurinol (ZYLOPRIM) 300 MG tablet Take 150 mg by mouth daily.      Marland Kitchen aspirin 81 MG EC tablet Take 81 mg by mouth daily.        Marland Kitchen buPROPion (WELLBUTRIN XL) 300 MG 24 hr tablet Take 300 mg by mouth daily.        . calcium carbonate (OS-CAL) 600 MG TABS Take 600 mg by mouth daily.        . Cholecalciferol (VITAMIN D) 400 UNITS capsule Take 400 Units by mouth daily.        Marland Kitchen diltiazem (CARDIZEM) 120 MG tablet Take 120 mg by mouth daily.        . divalproex (DEPAKOTE) 250 MG EC tablet Take 250 mg by mouth daily.        . furosemide (LASIX) 80 MG tablet Take 1 tablet (80 mg total) by mouth 2 (two) times daily.  60 tablet  6  . gabapentin (NEURONTIN) 100 MG capsule Take 100 mg by mouth 2 (two) times daily.        . insulin aspart (NOVOLOG) 100 UNIT/ML injection Inject into the skin. Sliding scale 5 - 45 units as needed up to four times a day      . insulin detemir (LEVEMIR) 100 UNIT/ML injection 100 units every morning & 110 units every evening      . isosorbide mononitrate (IMDUR) 60 MG 24 hr tablet Take 1.5 tablets (90 mg total) by mouth daily.  45 tablet  6  . labetalol (NORMODYNE) 200 MG tablet Take 2 tablets (400 mg total) by mouth 2 (two) times daily.  120 tablet  6  . Liraglutide (VICTOZA) 18 MG/3ML SOLN Inject 1.8 mg into the skin daily.       .  Multiple Vitamin (MULTIVITAMIN) tablet Take 1 tablet by mouth daily.        . nitroGLYCERIN (NITROLINGUAL) 0.4 MG/SPRAY spray Place 1 spray under the tongue every 5 (five) minutes as needed.        . Omega-3 Fatty Acids (FISH OIL) 1000 MG CAPS Take 1 capsule by mouth 2 (two) times daily.        . pantoprazole (PROTONIX) 40 MG tablet Take 40 mg by mouth daily.        . rosuvastatin (CRESTOR) 10 MG tablet Take 10 mg by mouth daily.      . sertraline (ZOLOFT) 100 MG tablet Take 100 mg by mouth daily.        . traZODone (DESYREL) 50 MG tablet Take 50 mg by mouth daily.         No current facility-administered medications for this visit.    Past Medical History  Diagnosis Date  . SVT (supraventricular tachycardia)   . Coronary atherosclerosis of native coronary artery     BMS circ  2006, 70% PDA 2009  . Renal tubular acidosis, type IV   . Mixed hyperlipidemia   . Type 2 diabetes mellitus   . Essential hypertension, benign   . OSA on CPAP   . Bipolar disorder     Social History Ms. Conaty reports that she has quit smoking. Her smoking use included Cigarettes. She started smoking about 9 months ago. She has a 20 pack-year smoking history. She has never used smokeless tobacco. Ms. Nys reports that she does not drink alcohol.  Review of Systems Intermittent ankle and leg edema. She continues on Lasix. Stable appetite. No orthopnea or PND. Otherwise as outlined.  Physical Examination Filed Vitals:   06/06/13 1435  BP: 148/74  Pulse: 62   Filed Weights   06/06/13 1435  Weight: 233 lb 1.9 oz (105.743 kg)    No acute distress.  HEENT: Conjunctiva and lids normal, oropharynx clear.  Neck: Supple, no elevated JVP or carotid bruits, no thyromegaly.  Lungs: Clear to auscultation, nonlabored breathing at rest.  Cardiac: Regular rate and rhythm with frequent ectopy, no S3 or significant systolic murmur, no pericardial rub.  Abdomen: Soft, nontender, bowel sounds present, no guarding or  rebound.  Extremities: No pitting edema, distal pulses 2+.    Problem List and Plan   CORONARY ATHEROSCLEROSIS NATIVE CORONARY ARTERY Symptomatically stable on medical therapy. ECG reviewed. No changes made today other than having her move her evening dose of Lasix to the afternoon to reduce frequency of nocturnal urination.  Mixed hyperlipidemia She continues on Crestor, followed by Dr. Manuella Ghazi.  SVT (supraventricular tachycardia) Quiescent. Continue calcium channel blocker and beta blocker.    Satira Sark, M.D., F.A.C.C.

## 2013-06-06 NOTE — Patient Instructions (Signed)

## 2013-06-06 NOTE — Assessment & Plan Note (Signed)
Symptomatically stable on medical therapy. ECG reviewed. No changes made today other than having her move her evening dose of Lasix to the afternoon to reduce frequency of nocturnal urination.

## 2013-06-06 NOTE — Assessment & Plan Note (Signed)
She continues on Crestor, followed by Dr. Manuella Ghazi.

## 2013-07-17 ENCOUNTER — Encounter: Payer: Self-pay | Admitting: Cardiology

## 2013-08-09 ENCOUNTER — Ambulatory Visit (INDEPENDENT_AMBULATORY_CARE_PROVIDER_SITE_OTHER): Payer: PRIVATE HEALTH INSURANCE | Admitting: Ophthalmology

## 2013-08-09 DIAGNOSIS — I10 Essential (primary) hypertension: Secondary | ICD-10-CM

## 2013-08-09 DIAGNOSIS — H43819 Vitreous degeneration, unspecified eye: Secondary | ICD-10-CM

## 2013-08-09 DIAGNOSIS — E1165 Type 2 diabetes mellitus with hyperglycemia: Secondary | ICD-10-CM

## 2013-08-09 DIAGNOSIS — E1139 Type 2 diabetes mellitus with other diabetic ophthalmic complication: Secondary | ICD-10-CM

## 2013-08-09 DIAGNOSIS — H2510 Age-related nuclear cataract, unspecified eye: Secondary | ICD-10-CM

## 2013-08-09 DIAGNOSIS — E11319 Type 2 diabetes mellitus with unspecified diabetic retinopathy without macular edema: Secondary | ICD-10-CM

## 2013-08-09 DIAGNOSIS — H35039 Hypertensive retinopathy, unspecified eye: Secondary | ICD-10-CM

## 2013-12-12 ENCOUNTER — Encounter: Payer: Self-pay | Admitting: Cardiology

## 2013-12-12 ENCOUNTER — Encounter: Payer: PRIVATE HEALTH INSURANCE | Admitting: Cardiology

## 2013-12-12 NOTE — Progress Notes (Signed)
Patient canceled.  This encounter was created in error - please disregard. 

## 2014-01-01 ENCOUNTER — Other Ambulatory Visit: Payer: Self-pay | Admitting: Cardiology

## 2014-01-09 ENCOUNTER — Encounter: Payer: Self-pay | Admitting: Cardiology

## 2014-01-09 ENCOUNTER — Other Ambulatory Visit: Payer: Self-pay | Admitting: *Deleted

## 2014-01-09 ENCOUNTER — Other Ambulatory Visit: Payer: Self-pay | Admitting: Cardiology

## 2014-01-09 ENCOUNTER — Ambulatory Visit (INDEPENDENT_AMBULATORY_CARE_PROVIDER_SITE_OTHER): Payer: PRIVATE HEALTH INSURANCE | Admitting: Cardiology

## 2014-01-09 VITALS — BP 149/64 | HR 61 | Ht 63.0 in | Wt 248.0 lb

## 2014-01-09 DIAGNOSIS — I2510 Atherosclerotic heart disease of native coronary artery without angina pectoris: Secondary | ICD-10-CM

## 2014-01-09 DIAGNOSIS — I471 Supraventricular tachycardia: Secondary | ICD-10-CM

## 2014-01-09 DIAGNOSIS — E782 Mixed hyperlipidemia: Secondary | ICD-10-CM

## 2014-01-09 MED ORDER — NITROGLYCERIN 0.4 MG/SPRAY TL SOLN: 1.0000 | Status: DC | PRN

## 2014-01-09 MED ORDER — LABETALOL HCL 200 MG PO TABS: 400.0000 mg | ORAL_TABLET | Freq: Two times a day (BID) | ORAL | Status: DC

## 2014-01-09 NOTE — Patient Instructions (Signed)

## 2014-01-09 NOTE — Assessment & Plan Note (Signed)
She continues on Crestor, will be having follow-up lipid panel with Dr. Manuella Ghazi in the next few months.

## 2014-01-09 NOTE — Progress Notes (Signed)
Reason for visit: CAD, hyperlipidemia, SVT  Clinical Summary Ms. Sieczkowski is a 63 y.o.female last seen in May. She presents for a routine visit. Medical regimen is reviewed below, she reports compliance with her medications, needs a refill on labetalol. She has not had any angina symptoms or needed nitroglycerin spray. Also no complaints of palpitations with stable NYHA class II dyspnea.  No follow-up ischemic testing since 2009. We discussed arranging follow-up Cardiolite to reassess ischemic burden, she prefer to hold off on testing at this time.  She will be seeing Dr. Manuella Ghazi in the next few months for a full physical with lab work. Lipids will be checked at that time. She states that she did not tolerate fish oils tablets, but has been able to stay on Crestor.   Allergies  Allergen Reactions  . Ace Inhibitors   . Penicillins   . Tetracycline   . Lamictal [Lamotrigine] Rash    Rash start inside to outter body.    Current Outpatient Prescriptions  Medication Sig Dispense Refill  . allopurinol (ZYLOPRIM) 300 MG tablet Take 150 mg by mouth daily.    Marland Kitchen aspirin 81 MG EC tablet Take 81 mg by mouth daily.      Marland Kitchen buPROPion (WELLBUTRIN XL) 300 MG 24 hr tablet Take 300 mg by mouth daily.      . calcium carbonate (OS-CAL) 600 MG TABS Take 600 mg by mouth daily.      . Cholecalciferol (VITAMIN D) 400 UNITS capsule Take 400 Units by mouth daily.      Marland Kitchen diltiazem (CARDIZEM) 120 MG tablet Take 120 mg by mouth daily.      . divalproex (DEPAKOTE) 250 MG EC tablet Take 250 mg by mouth daily.      . furosemide (LASIX) 80 MG tablet Take 1 tablet (80 mg total) by mouth 2 (two) times daily. 60 tablet 6  . insulin aspart (NOVOLOG) 100 UNIT/ML injection Inject into the skin. Sliding scale 5 - 45 units as needed up to four times a day    . insulin detemir (LEVEMIR) 100 UNIT/ML injection 100 units every morning & 110 units every evening    . isosorbide mononitrate (IMDUR) 60 MG 24 hr tablet TAKE 1 & 1/2  TABLETS BY MOUTH EVERY DAY 45 tablet 6  . labetalol (NORMODYNE) 200 MG tablet Take 2 tablets (400 mg total) by mouth 2 (two) times daily. 120 tablet 6  . Liraglutide (VICTOZA) 18 MG/3ML SOLN Inject 1.8 mg into the skin daily.     Marland Kitchen losartan (COZAAR) 25 MG tablet Take 25 mg by mouth daily.    . Multiple Vitamin (MULTIVITAMIN) tablet Take 1 tablet by mouth daily.      . nitroGLYCERIN (NITROLINGUAL) 0.4 MG/SPRAY spray Place 1 spray under the tongue every 5 (five) minutes as needed.      . pantoprazole (PROTONIX) 40 MG tablet Take 40 mg by mouth daily.      . rosuvastatin (CRESTOR) 10 MG tablet Take 10 mg by mouth daily.    . sertraline (ZOLOFT) 100 MG tablet Take 100 mg by mouth daily.      . traZODone (DESYREL) 50 MG tablet Take 50 mg by mouth daily.       No current facility-administered medications for this visit.    Past Medical History  Diagnosis Date  . SVT (supraventricular tachycardia)   . Coronary atherosclerosis of native coronary artery     BMS circ 2006, 70% PDA 2009  . Renal tubular acidosis, type  IV   . Mixed hyperlipidemia   . Type 2 diabetes mellitus   . Essential hypertension, benign   . OSA on CPAP   . Bipolar disorder     Social History Ms. Fackrell reports that she quit smoking about 6 months ago. Her smoking use included Cigarettes. She started smoking about 29 years ago. She has a 20 pack-year smoking history. She has never used smokeless tobacco. Ms. Chute reports that she does not drink alcohol.  Review of Systems Complete review of systems negative except as otherwise outlined in the clinical summary and also the following. No palpitations or syncope. No claudication. No orthopnea or PND.  Physical Examination Filed Vitals:   01/09/14 1347  BP: 149/64  Pulse: 61   Filed Weights   01/09/14 1347  Weight: 248 lb (112.492 kg)    No acute distress.  HEENT: Conjunctiva and lids normal, oropharynx clear.  Neck: Supple, no elevated JVP or carotid bruits, no  thyromegaly.  Lungs: Clear to auscultation, nonlabored breathing at rest.  Cardiac: Regular rate and rhythm with frequent ectopy, no S3 or significant systolic murmur, no pericardial rub.  Abdomen: Soft, nontender, bowel sounds present, no guarding or rebound.  Extremities: No pitting edema, distal pulses 2+.    Problem List and Plan   CORONARY ATHEROSCLEROSIS NATIVE CORONARY ARTERY Symptomatically stable with history of BMS to the circumflex in 2006, 70% PDA in 2009 managed medically. I recommended a follow-up Cardiolite to reassess ischemic burden, she prefers to hold off and continue observation on medical therapy for now. Follow-up in 6 months. Refill provided for labetalol.  Mixed hyperlipidemia She continues on Crestor, will be having follow-up lipid panel with Dr. Manuella Ghazi in the next few months.  SVT (supraventricular tachycardia) Quiescent, continues on labetalol and Cardizem CD.    Satira Sark, M.D., F.A.C.C.

## 2014-01-09 NOTE — Assessment & Plan Note (Signed)
Symptomatically stable with history of BMS to the circumflex in 2006, 70% PDA in 2009 managed medically. I recommended a follow-up Cardiolite to reassess ischemic burden, she prefers to hold off and continue observation on medical therapy for now. Follow-up in 6 months. Refill provided for labetalol.

## 2014-01-09 NOTE — Assessment & Plan Note (Signed)
Quiescent, continues on labetalol and Cardizem CD.

## 2014-02-14 ENCOUNTER — Ambulatory Visit (INDEPENDENT_AMBULATORY_CARE_PROVIDER_SITE_OTHER): Payer: PRIVATE HEALTH INSURANCE | Admitting: Ophthalmology

## 2014-02-14 DIAGNOSIS — I10 Essential (primary) hypertension: Secondary | ICD-10-CM

## 2014-02-14 DIAGNOSIS — H35033 Hypertensive retinopathy, bilateral: Secondary | ICD-10-CM

## 2014-02-14 DIAGNOSIS — H43813 Vitreous degeneration, bilateral: Secondary | ICD-10-CM

## 2014-02-14 DIAGNOSIS — E11319 Type 2 diabetes mellitus with unspecified diabetic retinopathy without macular edema: Secondary | ICD-10-CM

## 2014-02-14 DIAGNOSIS — E11339 Type 2 diabetes mellitus with moderate nonproliferative diabetic retinopathy without macular edema: Secondary | ICD-10-CM

## 2014-05-16 ENCOUNTER — Other Ambulatory Visit: Payer: Self-pay | Admitting: Cardiology

## 2014-07-16 ENCOUNTER — Other Ambulatory Visit: Payer: Self-pay

## 2014-08-08 ENCOUNTER — Other Ambulatory Visit: Payer: Self-pay | Admitting: Cardiology

## 2014-08-15 ENCOUNTER — Ambulatory Visit (INDEPENDENT_AMBULATORY_CARE_PROVIDER_SITE_OTHER): Payer: PRIVATE HEALTH INSURANCE | Admitting: Ophthalmology

## 2014-08-15 DIAGNOSIS — H35033 Hypertensive retinopathy, bilateral: Secondary | ICD-10-CM

## 2014-08-15 DIAGNOSIS — H43813 Vitreous degeneration, bilateral: Secondary | ICD-10-CM | POA: Diagnosis not present

## 2014-08-15 DIAGNOSIS — E11319 Type 2 diabetes mellitus with unspecified diabetic retinopathy without macular edema: Secondary | ICD-10-CM | POA: Diagnosis not present

## 2014-08-15 DIAGNOSIS — I10 Essential (primary) hypertension: Secondary | ICD-10-CM

## 2014-08-15 DIAGNOSIS — E11339 Type 2 diabetes mellitus with moderate nonproliferative diabetic retinopathy without macular edema: Secondary | ICD-10-CM | POA: Diagnosis not present

## 2014-09-03 ENCOUNTER — Other Ambulatory Visit: Payer: Self-pay | Admitting: *Deleted

## 2014-09-03 MED ORDER — ISOSORBIDE MONONITRATE ER 60 MG PO TB24: 90.0000 mg | ORAL_TABLET | Freq: Every day | ORAL | Status: DC

## 2014-09-07 ENCOUNTER — Other Ambulatory Visit: Payer: Self-pay | Admitting: Cardiology

## 2014-09-24 ENCOUNTER — Other Ambulatory Visit: Payer: Self-pay | Admitting: Cardiology

## 2015-01-06 ENCOUNTER — Other Ambulatory Visit: Payer: Self-pay | Admitting: Cardiology

## 2015-01-22 DIAGNOSIS — M9903 Segmental and somatic dysfunction of lumbar region: Secondary | ICD-10-CM | POA: Diagnosis not present

## 2015-01-22 DIAGNOSIS — M5442 Lumbago with sciatica, left side: Secondary | ICD-10-CM | POA: Diagnosis not present

## 2015-01-22 DIAGNOSIS — S76002A Unspecified injury of muscle, fascia and tendon of left hip, initial encounter: Secondary | ICD-10-CM | POA: Diagnosis not present

## 2015-01-22 DIAGNOSIS — M47816 Spondylosis without myelopathy or radiculopathy, lumbar region: Secondary | ICD-10-CM | POA: Diagnosis not present

## 2015-01-22 DIAGNOSIS — S338XXA Sprain of other parts of lumbar spine and pelvis, initial encounter: Secondary | ICD-10-CM | POA: Diagnosis not present

## 2015-01-31 DIAGNOSIS — E1142 Type 2 diabetes mellitus with diabetic polyneuropathy: Secondary | ICD-10-CM | POA: Diagnosis not present

## 2015-01-31 DIAGNOSIS — Z6841 Body Mass Index (BMI) 40.0 and over, adult: Secondary | ICD-10-CM | POA: Diagnosis not present

## 2015-01-31 DIAGNOSIS — Z418 Encounter for other procedures for purposes other than remedying health state: Secondary | ICD-10-CM | POA: Diagnosis not present

## 2015-01-31 DIAGNOSIS — Z87891 Personal history of nicotine dependence: Secondary | ICD-10-CM | POA: Diagnosis not present

## 2015-01-31 DIAGNOSIS — I509 Heart failure, unspecified: Secondary | ICD-10-CM | POA: Diagnosis not present

## 2015-01-31 DIAGNOSIS — Z1389 Encounter for screening for other disorder: Secondary | ICD-10-CM | POA: Diagnosis not present

## 2015-02-04 DIAGNOSIS — D472 Monoclonal gammopathy: Secondary | ICD-10-CM | POA: Diagnosis not present

## 2015-02-04 DIAGNOSIS — E1136 Type 2 diabetes mellitus with diabetic cataract: Secondary | ICD-10-CM | POA: Diagnosis not present

## 2015-02-04 DIAGNOSIS — D6489 Other specified anemias: Secondary | ICD-10-CM | POA: Diagnosis not present

## 2015-02-05 DIAGNOSIS — M47816 Spondylosis without myelopathy or radiculopathy, lumbar region: Secondary | ICD-10-CM | POA: Diagnosis not present

## 2015-02-05 DIAGNOSIS — S76002A Unspecified injury of muscle, fascia and tendon of left hip, initial encounter: Secondary | ICD-10-CM | POA: Diagnosis not present

## 2015-02-05 DIAGNOSIS — F4310 Post-traumatic stress disorder, unspecified: Secondary | ICD-10-CM | POA: Diagnosis not present

## 2015-02-05 DIAGNOSIS — F3112 Bipolar disorder, current episode manic without psychotic features, moderate: Secondary | ICD-10-CM | POA: Diagnosis not present

## 2015-02-05 DIAGNOSIS — S338XXA Sprain of other parts of lumbar spine and pelvis, initial encounter: Secondary | ICD-10-CM | POA: Diagnosis not present

## 2015-02-05 DIAGNOSIS — M5442 Lumbago with sciatica, left side: Secondary | ICD-10-CM | POA: Diagnosis not present

## 2015-02-05 DIAGNOSIS — F411 Generalized anxiety disorder: Secondary | ICD-10-CM | POA: Diagnosis not present

## 2015-02-05 DIAGNOSIS — M9903 Segmental and somatic dysfunction of lumbar region: Secondary | ICD-10-CM | POA: Diagnosis not present

## 2015-02-11 DIAGNOSIS — D472 Monoclonal gammopathy: Secondary | ICD-10-CM | POA: Diagnosis not present

## 2015-02-11 DIAGNOSIS — D6489 Other specified anemias: Secondary | ICD-10-CM | POA: Diagnosis not present

## 2015-02-11 DIAGNOSIS — N183 Chronic kidney disease, stage 3 (moderate): Secondary | ICD-10-CM | POA: Diagnosis not present

## 2015-02-18 ENCOUNTER — Ambulatory Visit (INDEPENDENT_AMBULATORY_CARE_PROVIDER_SITE_OTHER): Payer: PRIVATE HEALTH INSURANCE | Admitting: Ophthalmology

## 2015-02-21 ENCOUNTER — Other Ambulatory Visit: Payer: Self-pay | Admitting: Cardiology

## 2015-03-06 ENCOUNTER — Ambulatory Visit (INDEPENDENT_AMBULATORY_CARE_PROVIDER_SITE_OTHER): Payer: Medicare Other | Admitting: Ophthalmology

## 2015-03-06 DIAGNOSIS — E11319 Type 2 diabetes mellitus with unspecified diabetic retinopathy without macular edema: Secondary | ICD-10-CM

## 2015-03-06 DIAGNOSIS — E113393 Type 2 diabetes mellitus with moderate nonproliferative diabetic retinopathy without macular edema, bilateral: Secondary | ICD-10-CM

## 2015-03-06 DIAGNOSIS — H2513 Age-related nuclear cataract, bilateral: Secondary | ICD-10-CM

## 2015-03-06 DIAGNOSIS — H43813 Vitreous degeneration, bilateral: Secondary | ICD-10-CM | POA: Diagnosis not present

## 2015-03-06 DIAGNOSIS — I10 Essential (primary) hypertension: Secondary | ICD-10-CM

## 2015-03-06 DIAGNOSIS — H35033 Hypertensive retinopathy, bilateral: Secondary | ICD-10-CM

## 2015-03-07 ENCOUNTER — Encounter: Payer: Self-pay | Admitting: *Deleted

## 2015-03-07 ENCOUNTER — Encounter: Payer: Self-pay | Admitting: Cardiology

## 2015-03-07 ENCOUNTER — Ambulatory Visit (INDEPENDENT_AMBULATORY_CARE_PROVIDER_SITE_OTHER): Payer: Medicare Other | Admitting: Cardiology

## 2015-03-07 VITALS — BP 160/80 | HR 56 | Ht 63.0 in | Wt 261.0 lb

## 2015-03-07 DIAGNOSIS — N289 Disorder of kidney and ureter, unspecified: Secondary | ICD-10-CM

## 2015-03-07 DIAGNOSIS — I10 Essential (primary) hypertension: Secondary | ICD-10-CM | POA: Diagnosis not present

## 2015-03-07 DIAGNOSIS — I25119 Atherosclerotic heart disease of native coronary artery with unspecified angina pectoris: Secondary | ICD-10-CM

## 2015-03-07 DIAGNOSIS — R0609 Other forms of dyspnea: Secondary | ICD-10-CM

## 2015-03-07 DIAGNOSIS — I5032 Chronic diastolic (congestive) heart failure: Secondary | ICD-10-CM | POA: Diagnosis not present

## 2015-03-07 DIAGNOSIS — E782 Mixed hyperlipidemia: Secondary | ICD-10-CM

## 2015-03-07 MED ORDER — ISOSORBIDE MONONITRATE ER 60 MG PO TB24: 90.0000 mg | ORAL_TABLET | Freq: Every day | ORAL | Status: DC

## 2015-03-07 MED ORDER — LABETALOL HCL 200 MG PO TABS: 400.0000 mg | ORAL_TABLET | Freq: Two times a day (BID) | ORAL | Status: DC

## 2015-03-07 MED ORDER — FUROSEMIDE 80 MG PO TABS: 80.0000 mg | ORAL_TABLET | Freq: Two times a day (BID) | ORAL | Status: DC

## 2015-03-07 NOTE — Progress Notes (Signed)
Cardiology Office Note  Date: 03/07/2015   ID: Christy [name], DOB [birth year], MRN [id number]  PCP: Monico Blitz, MD  Primary Cardiologist: Rozann Lesches, MD   Chief Complaint  Patient presents with  . Coronary Artery Disease    History of Present Illness: Christy [name] is a 65 y.o. female last seen in December 2015. She presents for a routine follow-up visit. She tells me that over the last year she has been experiencing worsening dyspnea and fatigue with exertion, also intermittent angina symptoms. She denies any significant change in her medical regimen. We have discussed follow-up ischemic evaluation over time, her last coronary intervention being in 2006, but she has preferred to hold off until now.  I reviewed her ECG done today which shows sinus bradycardia with borderline prolonged PT interval, decreased R wave progression, and IVCD. Overall no major changes.  We discussed her medications. Cardiac regimen includes aspirin, Cardizem CD, Lasix, Imdur, labetalol, Cozaar, Crestor, and as needed nitroglycerin.  She follows with Dr. Lowanda Foster with a history of type IV RTA, last creatinine was 1.5. 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 I am requesting her most recent lab work for review.  Since I last saw her she has retired from the city of [place]. 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  Past Medical History  Diagnosis Date  . SVT (supraventricular tachycardia) ([place])   . Coronary atherosclerosis of native coronary artery     BMS circ 2006, 70% PDA 2009  . Renal tubular acidosis, type IV   . Mixed hyperlipidemia   . Type 2 diabetes mellitus ([place])   . Essential hypertension, benign   . OSA on CPAP   . Bipolar disorder [facility])     Past Surgical History  Procedure Laterality Date  . Right shoulder adhesive capsulitis    . Rotator cuff impingement syndrome    . Rotator cuff tear    . Acromioclavicular  arthritis      Current Outpatient Prescriptions  Medication Sig Dispense Refill  . allopurinol (ZYLOPRIM) 300 MG tablet Take 150 mg by mouth daily.    Marland Kitchen aspirin 81 MG EC tablet Take 81 mg by mouth daily.      Marland Kitchen buPROPion (WELLBUTRIN XL) 300 MG 24 hr tablet Take 300 mg by mouth daily.      . calcium carbonate (OS-CAL) 600 MG TABS Take 600 mg by mouth daily.      . Cholecalciferol (VITAMIN D) 400 UNITS capsule Take 400 Units by mouth daily.      Marland Kitchen diltiazem (CARDIZEM) 120 MG tablet Take 120 mg by mouth daily.      . divalproex (DEPAKOTE) 250 MG EC tablet Take 250 mg by mouth daily.      . insulin aspart (NOVOLOG) 100 UNIT/ML injection Inject into the skin. Sliding scale 5 - 45 units as needed up to four times a day    . insulin detemir (LEVEMIR) 100 UNIT/ML injection 100 units every morning & 110 units every evening    . Liraglutide (VICTOZA) 18 MG/3ML SOLN Inject 1.8 mg into the skin daily.     Marland Kitchen losartan (COZAAR) 25 MG tablet Take 25 mg by mouth daily.    . Multiple Vitamin (MULTIVITAMIN) tablet Take 1 tablet by mouth daily.      . nitroGLYCERIN (NITROLINGUAL) 0.4 MG/SPRAY spray Place 1 spray under the tongue every 5 (five) minutes x 3 doses as needed. 12 g  2  . pantoprazole (PROTONIX) 40 MG tablet Take 40 mg by mouth daily.      . rosuvastatin (CRESTOR) 10 MG tablet Take 10 mg by mouth daily.    . sertraline (ZOLOFT) 100 MG tablet Take 100 mg by mouth daily.      . traZODone (DESYREL) 50 MG tablet Take 50 mg by mouth daily.      . furosemide (LASIX) 80 MG tablet Take 1 tablet (80 mg total) by mouth 2 (two) times daily. 60 tablet 6  . isosorbide mononitrate (IMDUR) 60 MG 24 hr tablet Take 1.5 tablets (90 mg total) by mouth daily. 45 tablet 6  . labetalol (NORMODYNE) 200 MG tablet Take 2 tablets (400 mg total) by mouth 2 (two) times daily. 120 tablet 6   No current facility-administered medications for this visit.   Allergies:  Ace inhibitors; Penicillins; Tetracycline; and Lamictal    Social History: The patient  reports that she quit smoking about 19 months ago. Her smoking use included Cigarettes. She started smoking about 30 years ago. She has a 20 pack-year smoking history. She has never used smokeless tobacco. She reports that she does not drink alcohol or use illicit drugs.   ROS:  Please see the history of present illness. Otherwise, complete review of systems is positive for worsening dyspnea as outlined above. Reports compliance with CPAP. All other systems are reviewed and negative.   Physical Exam: VS:  BP 160/80 mmHg  Pulse 56  Ht 5\' 3"  (1.6 m)  Wt 261 lb (118.389 kg)  BMI 46.25 kg/m2  SpO2 95%, BMI Body mass index is 46.25 kg/(m^2).  Wt Readings from Last 3 Encounters:  03/07/15 261 lb (118.389 kg)  01/09/14 248 lb (112.492 kg)  06/06/13 233 lb 1.9 oz (105.743 kg)    Obese woman, no distress.  HEENT: Conjunctiva and lids normal, oropharynx clear.  Neck: Supple, no elevated JVP or carotid bruits, no thyromegaly.  Lungs: Clear to auscultation, nonlabored breathing at rest.  Cardiac: Regular rate and rhythm, no S3, 2/6 systolic murmur, no pericardial rub.  Abdomen: Soft, nontender, bowel sounds present, no guarding or rebound.  Extremities: No pitting edema, distal pulses 2+. Skin: Warm and dry. Muscular skeletal: No kyphosis. Neuropsychiatric: Alert and oriented 3, somewhat anxious but affect appropriate.  ECG: I personally reviewed the prior tracing from 06/06/2013 which showed normal sinus rhythm with prolonged PR interval and decreased R wave progression.  Recent Labwork:  June 2015: BUN 48, creatinine 1.5, AST 15, ALT 14, potassium 4.3, hemoglobin 12.0, platelets 156  Assessment and Plan:  1. CAD status post BMS to circumflex in 2006 with moderate PDA disease that was managed medically. She reports progressive dyspnea and fatigue with exertion as well as intermittent angina symptoms over the last year. ECG is overall stable. We have  discussed follow-up ischemic evaluation over time, and she states that she is ready to pursue this. We will proceed with a one day [facility] as well as echocardiogram and have her follow-up to review the results. No changes in medical regimen for now.  2. Essential hypertension, blood pressure elevated today. She reports compliance with her medications. Her weight has crept up over the last few years. We discussed diet and sodium restriction.  3. Hyperlipidemia, she continues on Crestor. She is followed by Dr. Manuella Ghazi.  4. History of renal insufficiency, also stage IV RTA. She follows with Dr. Lowanda Foster. Requesting most recent lab work.  5. OSA, she reports compliance with CPAP.  Current  medicines were reviewed with the patient today.   Orders Placed This Encounter  Procedures  . NM Myocar Multi W/Spect W/Wall Motion / EF  . Myocardial Perfusion Imaging  . EKG 12-Lead  . ECHOCARDIOGRAM COMPLETE    Disposition: FU with me in 3 weeks.   Signed, Satira Sark, MD, [facility] 03/07/2015 4:23 PM    [facility] at [address], [place], [place] [address] Phone: [phone]; Fax: [phone]

## 2015-03-07 NOTE — Patient Instructions (Signed)
Your physician has requested that you have an echocardiogram. Echocardiography is a painless test that uses sound waves to create images of your heart. It provides your doctor with information about the size and shape of your heart and how well your heart's chambers and valves are working. This procedure takes approximately one hour. There are no restrictions for this procedure. Your physician has requested that you have a lexiscan myoview. For further information please visit [email or web address]. Please follow instruction sheet, as given. Office will contact with results via phone or letter.   Continue all current medications. Follow up in  3 weeks

## 2015-03-13 ENCOUNTER — Ambulatory Visit (INDEPENDENT_AMBULATORY_CARE_PROVIDER_SITE_OTHER): Payer: Medicare Other

## 2015-03-13 ENCOUNTER — Other Ambulatory Visit: Payer: Self-pay

## 2015-03-13 DIAGNOSIS — R0609 Other forms of dyspnea: Secondary | ICD-10-CM

## 2015-03-13 DIAGNOSIS — I25119 Atherosclerotic heart disease of native coronary artery with unspecified angina pectoris: Secondary | ICD-10-CM

## 2015-03-14 DIAGNOSIS — E1140 Type 2 diabetes mellitus with diabetic neuropathy, unspecified: Secondary | ICD-10-CM | POA: Diagnosis not present

## 2015-03-14 DIAGNOSIS — L609 Nail disorder, unspecified: Secondary | ICD-10-CM | POA: Diagnosis not present

## 2015-03-14 DIAGNOSIS — L110 Acquired keratosis follicularis: Secondary | ICD-10-CM | POA: Diagnosis not present

## 2015-03-19 ENCOUNTER — Encounter (HOSPITAL_COMMUNITY): Payer: Self-pay

## 2015-03-19 ENCOUNTER — Encounter (HOSPITAL_COMMUNITY)
Admission: RE | Admit: 2015-03-19 | Discharge: 2015-03-19 | Disposition: A | Discharge status: Home / Self Care | Payer: Medicare Other | Source: Ambulatory Visit | Attending: Cardiology | Admitting: Cardiology

## 2015-03-19 ENCOUNTER — Inpatient Hospital Stay (HOSPITAL_COMMUNITY): Admission: RE | Admit: 2015-03-19 | Payer: Medicare Other | Source: Ambulatory Visit

## 2015-03-19 DIAGNOSIS — R0609 Other forms of dyspnea: Secondary | ICD-10-CM

## 2015-03-19 DIAGNOSIS — I25119 Atherosclerotic heart disease of native coronary artery with unspecified angina pectoris: Secondary | ICD-10-CM | POA: Insufficient documentation

## 2015-03-19 DIAGNOSIS — I5189 Other ill-defined heart diseases: Secondary | ICD-10-CM | POA: Insufficient documentation

## 2015-03-19 LAB — NM MYOCAR MULTI W/SPECT W/WALL MOTION / EF
CHL CUP RESTING HR STRESS: 65 {beats}/min
CSEPPHR: 69 {beats}/min
LHR: 0.32
LVDIAVOL: 183 mL
LVSYSVOL: 73 mL
SDS: 9
SRS: 6
SSS: 15
TID: 1.33

## 2015-03-19 MED ORDER — REGADENOSON 0.4 MG/5ML IV SOLN
INTRAVENOUS | Status: AC
  Administered 2015-03-19: 0.4 mg
  Filled 2015-03-19: qty 5

## 2015-03-19 MED ORDER — TECHNETIUM TC 99M SESTAMIBI - CARDIOLITE
30.0000 | Freq: Once | INTRAVENOUS | Status: AC | PRN
  Administered 2015-03-19: 10:00:00 28 via INTRAVENOUS

## 2015-03-19 MED ORDER — TECHNETIUM TC 99M SESTAMIBI GENERIC - CARDIOLITE
10.0000 | Freq: Once | INTRAVENOUS | Status: AC | PRN
  Administered 2015-03-19: 10.3 via INTRAVENOUS

## 2015-03-19 MED ORDER — SODIUM CHLORIDE 0.9% FLUSH
INTRAVENOUS | Status: AC
  Filled 2015-03-19: qty 10

## 2015-03-20 ENCOUNTER — Telehealth: Payer: Self-pay | Admitting: *Deleted

## 2015-03-20 ENCOUNTER — Encounter: Payer: Self-pay | Admitting: *Deleted

## 2015-03-20 DIAGNOSIS — M25552 Pain in left hip: Secondary | ICD-10-CM | POA: Diagnosis not present

## 2015-03-20 DIAGNOSIS — Z0000 Encounter for general adult medical examination without abnormal findings: Secondary | ICD-10-CM | POA: Diagnosis not present

## 2015-03-20 DIAGNOSIS — Z1211 Encounter for screening for malignant neoplasm of colon: Secondary | ICD-10-CM | POA: Diagnosis not present

## 2015-03-20 DIAGNOSIS — Z299 Encounter for prophylactic measures, unspecified: Secondary | ICD-10-CM | POA: Diagnosis not present

## 2015-03-20 DIAGNOSIS — Z7189 Other specified counseling: Secondary | ICD-10-CM | POA: Diagnosis not present

## 2015-03-20 DIAGNOSIS — F319 Bipolar disorder, unspecified: Secondary | ICD-10-CM | POA: Diagnosis not present

## 2015-03-20 DIAGNOSIS — M1612 Unilateral primary osteoarthritis, left hip: Secondary | ICD-10-CM | POA: Diagnosis not present

## 2015-03-20 NOTE — Telephone Encounter (Signed)
-----   Message from Satira Sark, MD sent at 03/13/2015  5:01 PM EST ----- Reviewed report. LVEF remains normal at 60-65%. She does have diastolic dysfunction which could be contributing to shortness of breath, although we do need to follow-up on the stress test as well.

## 2015-03-20 NOTE — Telephone Encounter (Signed)
-----   Message from Satira Sark, MD sent at 03/19/2015 12:40 PM EST ----- Reviewed study. Images suggest that her CAD has progressed since remote evaluation. In light of her exertional symptoms, we should probably talk about doing a cardiac catheterization for further assessment. I do need to see her most recent lab work regarding renal function. Please schedule an office visit for [place] to review.

## 2015-03-20 NOTE — Telephone Encounter (Signed)
Patient informed and verbalized understanding of plan. 

## 2015-03-25 DIAGNOSIS — I509 Heart failure, unspecified: Secondary | ICD-10-CM | POA: Diagnosis not present

## 2015-03-25 DIAGNOSIS — E1142 Type 2 diabetes mellitus with diabetic polyneuropathy: Secondary | ICD-10-CM | POA: Diagnosis not present

## 2015-03-25 DIAGNOSIS — M25559 Pain in unspecified hip: Secondary | ICD-10-CM | POA: Diagnosis not present

## 2015-03-25 DIAGNOSIS — I2510 Atherosclerotic heart disease of native coronary artery without angina pectoris: Secondary | ICD-10-CM | POA: Diagnosis not present

## 2015-03-27 ENCOUNTER — Telehealth: Payer: Self-pay | Admitting: Cardiology

## 2015-03-27 ENCOUNTER — Encounter: Payer: Self-pay | Admitting: *Deleted

## 2015-03-27 ENCOUNTER — Encounter: Payer: Self-pay | Admitting: Cardiology

## 2015-03-27 ENCOUNTER — Other Ambulatory Visit: Payer: Self-pay | Admitting: Cardiology

## 2015-03-27 ENCOUNTER — Ambulatory Visit (INDEPENDENT_AMBULATORY_CARE_PROVIDER_SITE_OTHER): Payer: Medicare Other | Admitting: Cardiology

## 2015-03-27 VITALS — BP 146/64 | HR 60 | Ht 63.0 in | Wt 262.0 lb

## 2015-03-27 DIAGNOSIS — I25119 Atherosclerotic heart disease of native coronary artery with unspecified angina pectoris: Secondary | ICD-10-CM

## 2015-03-27 DIAGNOSIS — E1159 Type 2 diabetes mellitus with other circulatory complications: Secondary | ICD-10-CM

## 2015-03-27 DIAGNOSIS — R9439 Abnormal result of other cardiovascular function study: Secondary | ICD-10-CM | POA: Diagnosis not present

## 2015-03-27 DIAGNOSIS — I471 Supraventricular tachycardia: Secondary | ICD-10-CM

## 2015-03-27 DIAGNOSIS — Z794 Long term (current) use of insulin: Secondary | ICD-10-CM

## 2015-03-27 DIAGNOSIS — N289 Disorder of kidney and ureter, unspecified: Secondary | ICD-10-CM

## 2015-03-27 DIAGNOSIS — I5032 Chronic diastolic (congestive) heart failure: Secondary | ICD-10-CM

## 2015-03-27 DIAGNOSIS — I10 Essential (primary) hypertension: Secondary | ICD-10-CM | POA: Diagnosis not present

## 2015-03-27 NOTE — Telephone Encounter (Signed)
No precert required 

## 2015-03-27 NOTE — Patient Instructions (Signed)
Your physician recommends that you continue on your current medications as directed. Please refer to the Current Medication list given to you today. Your physician has requested that you have a cardiac catheterization. Cardiac catheterization is used to diagnose and/or treat various heart conditions. Doctors may recommend this procedure for a number of different reasons. The most common reason is to evaluate chest pain. Chest pain can be a symptom of coronary artery disease (CAD), and cardiac catheterization can show whether plaque is narrowing or blocking your heart's arteries. This procedure is also used to evaluate the valves, as well as measure the blood flow and oxygen levels in different parts of your heart. For further information please visit www.[email or web address]. Please follow instruction sheet, as given. Your physician recommends that you schedule a follow-up appointment in: 1 month. 

## 2015-03-27 NOTE — Telephone Encounter (Signed)
Left heart cath Friday March 29, 2015 @9 :00 am with Dr. Claiborne Billings dx: CAD w/angina & abnormal myocardial perfusion study Checking percert

## 2015-03-27 NOTE — Progress Notes (Signed)
Cardiology Office Note  Date: 03/27/2015   ID: Christy [name], DOB [birth year], MRN [id number]  PCP: Monico Blitz, MD  Primary Cardiologist: Rozann Lesches, MD   Chief Complaint  Patient presents with  . Follow-up cardiac testing    History of Present Illness: Christy [name] is a 65 y.o. female last seen in February 2017. She was referred for follow-up ischemic testing with progressive dyspnea and fatigue as well as intermittent exertional angina symptoms.  Lexiscan Cardiolite and echocardiogram reports are outlined below. In summary she has preserved LVEF with restrictive diastolic filling pattern an severe left atrial enlargement although only mildly increased PA systolic pressure. Ischemic defect was noted in the inferior wall, potentially also to some degree in the anterior wall although soft tissue attenuation also affected the study. TID ratio was 1.33.  Today we discussed the results of her testing in detail and the implications as it relates to potential progression in CAD over time in her symptoms. Some of her symptoms may certainly be related to diastolic dysfunction, however we discussed proceeding to a cardiac catheterization to better understand her coronary anatomy and assess for revascularization options. We discussed the potential risks and benefits, and she is in agreement to proceed.  We reviewed her medications, outlined below. At this point no changes are planned until after cardiac catheterization. She will continue present diuretic dose, hold for the morning of the procedure.  She has a history of renal insufficiency and type IV RTA, her last creatinine was 1.2.  Past Medical History  Diagnosis Date  . SVT (supraventricular tachycardia) ([place])   . Coronary atherosclerosis of native coronary artery     BMS circ 2006, 70% PDA 2009  . Renal tubular acidosis, type IV   . Mixed hyperlipidemia   . Type 2 diabetes mellitus ([place])   . Essential hypertension, benign   .  OSA on CPAP   . Bipolar disorder [facility])     Past Surgical History  Procedure Laterality Date  . Right shoulder adhesive capsulitis    . Rotator cuff impingement syndrome    . Rotator cuff tear    . Acromioclavicular arthritis      Current Outpatient Prescriptions  Medication Sig Dispense Refill  . allopurinol (ZYLOPRIM) 300 MG tablet Take 150 mg by mouth daily.    Marland Kitchen aspirin 81 MG EC tablet Take 81 mg by mouth daily.      Marland Kitchen buPROPion (WELLBUTRIN XL) 300 MG 24 hr tablet Take 300 mg by mouth daily.      . calcium carbonate (OS-CAL) 600 MG TABS Take 600 mg by mouth daily.      . Cholecalciferol (VITAMIN D) 400 UNITS capsule Take 400 Units by mouth daily.      Marland Kitchen diltiazem (CARDIZEM) 120 MG tablet Take 120 mg by mouth daily.      . divalproex (DEPAKOTE) 250 MG EC tablet Take 250 mg by mouth daily.      . furosemide (LASIX) 80 MG tablet Take 1 tablet (80 mg total) by mouth 2 (two) times daily. 60 tablet 6  . insulin aspart (NOVOLOG) 100 UNIT/ML injection Inject into the skin. Sliding scale 5 - 45 units as needed up to four times a day    . Insulin Glargine (TOUJEO SOLOSTAR Advance) Inject into the skin. 100 units AM and 90 units PM    . isosorbide mononitrate (IMDUR) 60 MG 24 hr tablet Take 1.5 tablets (90 mg total) by mouth daily. 45 tablet 6  . labetalol (  NORMODYNE) 200 MG tablet Take 2 tablets (400 mg total) by mouth 2 (two) times daily. 120 tablet 6  . Liraglutide (VICTOZA) 18 MG/3ML SOLN Inject 1.8 mg into the skin daily.     Marland Kitchen losartan (COZAAR) 25 MG tablet Take 25 mg by mouth daily.    . Multiple Vitamin (MULTIVITAMIN) tablet Take 1 tablet by mouth daily.      . nitroGLYCERIN (NITROLINGUAL) 0.4 MG/SPRAY spray Place 1 spray under the tongue every 5 (five) minutes x 3 doses as needed. 12 g 2  . pantoprazole (PROTONIX) 40 MG tablet Take 40 mg by mouth daily.      . rosuvastatin (CRESTOR) 10 MG tablet Take 10 mg by mouth daily.    . sertraline (ZOLOFT) 100 MG tablet Take 100 mg by mouth daily.       . traZODone (DESYREL) 50 MG tablet Take 50 mg by mouth daily.       No current facility-administered medications for this visit.   Allergies:  Ace inhibitors; Penicillins; Tetracycline; and Lamictal   Social History: The patient  reports that she quit smoking about 20 months ago. Her smoking use included Cigarettes. She started smoking about 30 years ago. She has a 20 pack-year smoking history. She has never used smokeless tobacco. She reports that she does not drink alcohol or use illicit drugs.   Family History: The patient's family history includes Bipolar disorder in her brother; Diabetes in her father.   ROS:  Please see the history of present illness. Otherwise, complete review of systems is positive for leg edema.  All other systems are reviewed and negative.   Physical Exam: VS:  BP 146/64 mmHg  Pulse 60  Ht 5\' 3"  (1.6 m)  Wt 262 lb (118.842 kg)  BMI 46.42 kg/m2  SpO2 95%, BMI Body mass index is 46.42 kg/(m^2).  Wt Readings from Last 3 Encounters:  03/27/15 262 lb (118.842 kg)  03/07/15 261 lb (118.389 kg)  01/09/14 248 lb (112.492 kg)    Obese woman, no distress.  HEENT: Conjunctiva and lids normal, oropharynx clear.  Neck: Supple, no elevated JVP or carotid bruits, no thyromegaly.  Lungs: Clear to auscultation, nonlabored breathing at rest.  Cardiac: Regular rate and rhythm, no S3, 2/6 systolic murmur, no pericardial rub.  Abdomen: Soft, nontender, bowel sounds present, no guarding or rebound.  Extremities: Ankle edema, distal pulses 2+. Skin: Warm and dry. Muscular skeletal: No kyphosis. Neuropsychiatric: Alert and oriented 3, somewhat anxious but affect appropriate.  ECG:  I personally reviewed the prior tracing from 03/07/2015 which showed sinus bradycardia with prolonged PR interval and IVCD.  Recent Labwork:  November 2016: BUN 31, creatinine1.2, potassium 5.0, hemoglobin 11.1  Other Studies Reviewed Today:  Lexiscan Cardiolite 03/19/2015:  No  diagnostic ST segment changes to indicate ischemia.  Small, moderate intensity, inferior defect that is reversible and consistent with ischemia most likely in the RCA distribution.  Medium, mild intensity, partially reversible anterior defect suggestive of variable soft tissue attenuation although mild ischemia not excluded.  This is an intermediate risk study.  Nuclear stress EF: 60%. TID ratio 1.33.  Echocardiogram 03/13/2015: Study Conclusions  - Left ventricle: The cavity size was normal. Wall thickness was  increased in a pattern of mild LVH. Systolic function was normal.  The estimated ejection fraction was in the range of 60% to 65%.  Wall motion was normal; there were no regional wall motion  abnormalities. Doppler parameters are consistent with restrictive  physiology, indicative of decreased left ventricular  diastolic  compliance and/or increased left atrial pressure. - Aortic valve: There was mild regurgitation. Valve area (VTI):  2.84 cm^2. Valve area (Vmax): 2.77 cm^2. Valve area (Vmean): 2.57  cm^2. - Mitral valve: Mildly calcified annulus. Mildly thickened leaflets  . There was mild regurgitation. - Left atrium: The atrium was severely dilated. - Right atrium: The atrium was mildly dilated. - Pulmonary arteries: Systolic pressure was mildly increased. PA  peak pressure: 37 mm Hg (S). - Technically difficult study.  Assessment and Plan:  1. Progressive dyspnea and fatigue as well as intermittent angina symptoms. CAD history includes BMS to the circumflex in 2006 with moderate PDA disease that was managed medically in 2009. Suspect multifactorial etiology with restrictive diastolic filling pattern by recent echocardiogram, obesity, however also evidence of ischemia by her recent Cardiolite study suggesting progressive CAD over the last 10 years. TID ratio was also increased which could point toward a multivessel process. In light of this, we discussed  proceeding to a diagnostic cardiac catheterization to clearly outline coronary anatomy and assess for revascularization options. We discussed the potential risks and benefits, and she is in agreement to proceed.  2. Essential hypertension, blood pressure has been elevated recently. She is currently on Cardizem CD, labetalol, Cozaar, and Lasix. Anticipate further medication adjustments once we have a better handle on her coronary anatomy.  3. Type 2 diabetes mellitus, followed by Dr. Manuella Ghazi. She is on both oral agents and insulin.  4. Morbid obesity.  5. History of renal insufficiency and type IV RTA, last creatinine was 1.2.  6. OSA on CPAP. She reports compliance.  7. Previous history of PSVT, quiescent.  Current medicines were reviewed with the patient today.  Disposition: FU with me after cardiac catheterization.   Signed, Satira Sark, MD, [facility] 03/27/2015 9:30 AM    [facility] at [place], [place], [place] [address] Phone: [phone]; Fax: [phone]

## 2015-03-29 ENCOUNTER — Ambulatory Visit (HOSPITAL_COMMUNITY)
Admission: RE | Admit: 2015-03-29 | Discharge: 2015-03-29 | Disposition: A | Discharge status: Home / Self Care | Payer: Medicare Other | Source: Ambulatory Visit | Attending: Cardiovascular Disease | Admitting: Cardiovascular Disease

## 2015-03-29 ENCOUNTER — Encounter (HOSPITAL_COMMUNITY): Payer: Self-pay | Admitting: Cardiovascular Disease

## 2015-03-29 ENCOUNTER — Encounter (HOSPITAL_COMMUNITY)
Admission: RE | Disposition: A | Discharge status: Home / Self Care | Payer: Self-pay | Source: Ambulatory Visit | Attending: Cardiovascular Disease

## 2015-03-29 DIAGNOSIS — Z87891 Personal history of nicotine dependence: Secondary | ICD-10-CM | POA: Diagnosis not present

## 2015-03-29 DIAGNOSIS — G4733 Obstructive sleep apnea (adult) (pediatric): Secondary | ICD-10-CM | POA: Insufficient documentation

## 2015-03-29 DIAGNOSIS — I10 Essential (primary) hypertension: Secondary | ICD-10-CM | POA: Insufficient documentation

## 2015-03-29 DIAGNOSIS — Z794 Long term (current) use of insulin: Secondary | ICD-10-CM | POA: Insufficient documentation

## 2015-03-29 DIAGNOSIS — E119 Type 2 diabetes mellitus without complications: Secondary | ICD-10-CM | POA: Insufficient documentation

## 2015-03-29 DIAGNOSIS — Z6841 Body Mass Index (BMI) 40.0 and over, adult: Secondary | ICD-10-CM | POA: Diagnosis not present

## 2015-03-29 DIAGNOSIS — Z955 Presence of coronary angioplasty implant and graft: Secondary | ICD-10-CM | POA: Diagnosis not present

## 2015-03-29 DIAGNOSIS — E782 Mixed hyperlipidemia: Secondary | ICD-10-CM | POA: Diagnosis not present

## 2015-03-29 DIAGNOSIS — R9439 Abnormal result of other cardiovascular function study: Secondary | ICD-10-CM | POA: Insufficient documentation

## 2015-03-29 DIAGNOSIS — Z7982 Long term (current) use of aspirin: Secondary | ICD-10-CM | POA: Diagnosis not present

## 2015-03-29 DIAGNOSIS — I2510 Atherosclerotic heart disease of native coronary artery without angina pectoris: Secondary | ICD-10-CM | POA: Diagnosis not present

## 2015-03-29 DIAGNOSIS — Z880 Allergy status to penicillin: Secondary | ICD-10-CM | POA: Diagnosis not present

## 2015-03-29 DIAGNOSIS — I471 Supraventricular tachycardia: Secondary | ICD-10-CM | POA: Diagnosis not present

## 2015-03-29 DIAGNOSIS — F319 Bipolar disorder, unspecified: Secondary | ICD-10-CM | POA: Insufficient documentation

## 2015-03-29 HISTORY — PX: CARDIAC CATHETERIZATION: SHX172

## 2015-03-29 LAB — BASIC METABOLIC PANEL
ANION GAP: 13 (ref 5–15)
BUN: 43 mg/dL — ABNORMAL HIGH (ref 6–20)
CHLORIDE: 107 mmol/L (ref 101–111)
CO2: 24 mmol/L (ref 22–32)
Calcium: 9.2 mg/dL (ref 8.9–10.3)
Creatinine, Ser: 1.35 mg/dL — ABNORMAL HIGH (ref 0.44–1.00)
GFR calc Af Amer: 47 mL/min — ABNORMAL LOW (ref >60)
GFR, EST NON AFRICAN AMERICAN: 40 mL/min — AB (ref >60)
GLUCOSE: 163 mg/dL — AB (ref 65–99)
POTASSIUM: 5 mmol/L (ref 3.5–5.1)
Sodium: 144 mmol/L (ref 135–145)

## 2015-03-29 LAB — PROTIME-INR
INR: 1.19 (ref 0.00–1.49)
Prothrombin Time: 15.2 seconds (ref 11.6–15.2)

## 2015-03-29 LAB — CBC
HEMATOCRIT: 32.5 % — AB (ref 36.0–46.0)
HEMOGLOBIN: 10.2 g/dL — AB (ref 12.0–15.0)
MCH: 30.4 pg (ref 26.0–34.0)
MCHC: 31.4 g/dL (ref 30.0–36.0)
MCV: 97 fL (ref 78.0–100.0)
Platelets: 169 10*3/uL (ref 150–400)
RBC: 3.35 MIL/uL — ABNORMAL LOW (ref 3.87–5.11)
RDW: 13.8 % (ref 11.5–15.5)
WBC: 8.4 10*3/uL (ref 4.0–10.5)

## 2015-03-29 LAB — GLUCOSE, CAPILLARY: GLUCOSE-CAPILLARY: 145 mg/dL — AB (ref 65–99)

## 2015-03-29 SURGERY — LEFT HEART CATH AND CORONARY ANGIOGRAPHY

## 2015-03-29 MED ORDER — ONDANSETRON HCL 4 MG/2ML IJ SOLN: 4.0000 mg | Freq: Four times a day (QID) | INTRAMUSCULAR | Status: DC | PRN

## 2015-03-29 MED ORDER — MIDAZOLAM HCL 2 MG/2ML IJ SOLN
INTRAMUSCULAR | Status: AC
  Filled 2015-03-29: qty 2

## 2015-03-29 MED ORDER — SODIUM CHLORIDE 0.9 % WEIGHT BASED INFUSION: 1.0000 mL/kg/h | INTRAVENOUS | Status: DC

## 2015-03-29 MED ORDER — ACETAMINOPHEN 325 MG PO TABS: 650.0000 mg | ORAL_TABLET | ORAL | Status: DC | PRN

## 2015-03-29 MED ORDER — LIDOCAINE HCL (PF) 1 % IJ SOLN
INTRAMUSCULAR | Status: DC | PRN
  Administered 2015-03-29 (×2): 2 mL

## 2015-03-29 MED ORDER — SODIUM CHLORIDE 0.9% FLUSH: 3.0000 mL | Freq: Two times a day (BID) | INTRAVENOUS | Status: DC

## 2015-03-29 MED ORDER — FENTANYL CITRATE (PF) 100 MCG/2ML IJ SOLN
INTRAMUSCULAR | Status: AC
  Filled 2015-03-29: qty 2

## 2015-03-29 MED ORDER — SODIUM CHLORIDE 0.9% FLUSH: 3.0000 mL | INTRAVENOUS | Status: DC | PRN

## 2015-03-29 MED ORDER — HEPARIN (PORCINE) IN NACL 2-0.9 UNIT/ML-% IJ SOLN
INTRAMUSCULAR | Status: AC
  Filled 2015-03-29: qty 1000

## 2015-03-29 MED ORDER — MIDAZOLAM HCL 2 MG/2ML IJ SOLN
INTRAMUSCULAR | Status: DC | PRN
  Administered 2015-03-29: 1 mg via INTRAVENOUS
  Administered 2015-03-29: 2 mg via INTRAVENOUS

## 2015-03-29 MED ORDER — HEPARIN (PORCINE) IN NACL 2-0.9 UNIT/ML-% IJ SOLN
INTRAMUSCULAR | Status: DC | PRN
  Administered 2015-03-29: 1000 mL

## 2015-03-29 MED ORDER — HEPARIN SODIUM (PORCINE) 1000 UNIT/ML IJ SOLN
INTRAMUSCULAR | Status: AC
  Filled 2015-03-29: qty 1

## 2015-03-29 MED ORDER — HYDRALAZINE HCL 20 MG/ML IJ SOLN
INTRAMUSCULAR | Status: DC | PRN
  Administered 2015-03-29: 5 mg via INTRAVENOUS

## 2015-03-29 MED ORDER — SODIUM CHLORIDE 0.9 % IV SOLN
250.0000 mL | INTRAVENOUS | Status: DC | PRN
Start: 2015-03-29 — End: 2015-03-29

## 2015-03-29 MED ORDER — ASPIRIN 81 MG PO CHEW: 81.0000 mg | CHEWABLE_TABLET | ORAL | Status: DC

## 2015-03-29 MED ORDER — LIDOCAINE HCL (PF) 1 % IJ SOLN
INTRAMUSCULAR | Status: AC
  Filled 2015-03-29: qty 30

## 2015-03-29 MED ORDER — FENTANYL CITRATE (PF) 100 MCG/2ML IJ SOLN
INTRAMUSCULAR | Status: DC | PRN
  Administered 2015-03-29: 50 ug via INTRAVENOUS
  Administered 2015-03-29: 25 ug via INTRAVENOUS

## 2015-03-29 MED ORDER — SODIUM CHLORIDE 0.9 % IV SOLN: 250.0000 mL | INTRAVENOUS | Status: DC | PRN

## 2015-03-29 MED ORDER — VERAPAMIL HCL 2.5 MG/ML IV SOLN
INTRAVENOUS | Status: AC
  Filled 2015-03-29: qty 2

## 2015-03-29 MED ORDER — SODIUM CHLORIDE 0.9 % IV SOLN
INTRAVENOUS | Status: DC
  Administered 2015-03-29: 08:00:00 via INTRAVENOUS

## 2015-03-29 MED ORDER — VERAPAMIL HCL 2.5 MG/ML IV SOLN
INTRAVENOUS | Status: DC | PRN
  Administered 2015-03-29: 10:00:00 via INTRA_ARTERIAL

## 2015-03-29 MED ORDER — HYDRALAZINE HCL 20 MG/ML IJ SOLN
INTRAMUSCULAR | Status: AC
  Filled 2015-03-29: qty 1

## 2015-03-29 MED ORDER — HEPARIN SODIUM (PORCINE) 1000 UNIT/ML IJ SOLN
INTRAMUSCULAR | Status: DC | PRN
  Administered 2015-03-29: 5000 [IU] via INTRAVENOUS

## 2015-03-29 MED ORDER — IOHEXOL 350 MG/ML SOLN
INTRAVENOUS | Status: DC | PRN
  Administered 2015-03-29: 70 mL via INTRAVENOUS

## 2015-03-29 SURGICAL SUPPLY — 12 items
CATH INFINITI 5 FR JL3.5 (CATHETERS) ×1 IMPLANT
CATH INFINITI 5FR ANG PIGTAIL (CATHETERS) ×1 IMPLANT
CATH INFINITI JR4 5F (CATHETERS) ×1 IMPLANT
DEVICE RAD COMP TR BAND LRG (VASCULAR PRODUCTS) ×2 IMPLANT
GLIDESHEATH SLEND SS 6F .021 (SHEATH) ×1 IMPLANT
KIT HEART LEFT (KITS) ×2 IMPLANT
PACK CARDIAC CATHETERIZATION (CUSTOM PROCEDURE TRAY) ×2 IMPLANT
SYR MEDRAD MARK V 150ML (SYRINGE) ×2 IMPLANT
TRANSDUCER W/STOPCOCK (MISCELLANEOUS) ×2 IMPLANT
TUBING CIL FLEX 10 FLL-RA (TUBING) ×2 IMPLANT
WIRE HI TORQ VERSACORE-J 145CM (WIRE) ×1 IMPLANT
WIRE SAFE-T 1.5MM-J .035X260CM (WIRE) ×1 IMPLANT

## 2015-03-29 NOTE — Discharge Instructions (Signed)
Radial Site Care °Refer to this sheet in the next few weeks. These instructions provide you with information about caring for yourself after your procedure. Your health care provider may also give you more specific instructions. Your treatment has been planned according to current medical practices, but problems sometimes occur. Call your health care provider if you have any problems or questions after your procedure. °WHAT TO EXPECT AFTER THE PROCEDURE °After your procedure, it is typical to have the following: °· Bruising at the radial site that usually fades within 1-2 weeks. °· Blood collecting in the tissue (hematoma) that may be painful to the touch. It should usually decrease in size and tenderness within 1-2 weeks. °HOME CARE INSTRUCTIONS °· Take medicines only as directed by your health care provider. °· You may shower 24-48 hours after the procedure or as directed by your health care provider. Remove the bandage (dressing) and gently wash the site with plain soap and water. Pat the area dry with a clean towel. Do not rub the site, because this may cause bleeding. °· Do not take baths, swim, or use a hot tub until your health care provider approves. °· Check your insertion site every day for redness, swelling, or drainage. °· Do not apply powder or lotion to the site. °· Do not flex or bend the affected arm for 24 hours or as directed by your health care provider. °· Do not push or pull heavy objects with the affected arm for 24 hours or as directed by your health care provider. °· Do not lift over 10 lb (4.5 kg) for 5 days after your procedure or as directed by your health care provider. °· Ask your health care provider when it is okay to: °¨ Return to work or school. °¨ Resume usual physical activities or sports. °¨ Resume sexual activity. °· Do not drive home if you are discharged the same day as the procedure. Have someone else drive you. °· You may drive 24 hours after the procedure unless otherwise  instructed by your health care provider. °· Do not operate machinery or power tools for 24 hours after the procedure. °· If your procedure was done as an outpatient procedure, which means that you went home the same day as your procedure, a responsible adult should be with you for the first 24 hours after you arrive home. °· Keep all follow-up visits as directed by your health care provider. This is important. °SEEK MEDICAL CARE IF: °· You have a fever. °· You have chills. °· You have increased bleeding from the radial site. Hold pressure on the site and call 911. °SEEK IMMEDIATE MEDICAL CARE IF: °· You have unusual pain at the radial site. °· You have redness, warmth, or swelling at the radial site. °· You have drainage (other than a small amount of blood on the dressing) from the radial site. °· The radial site is bleeding, and the bleeding does not stop after 30 minutes of holding steady pressure on the site. °· Your arm or hand becomes pale, cool, tingly, or numb. °  °This information is not intended to replace advice given to you by your health care provider. Make sure you discuss any questions you have with your health care provider. °  °Document Released: 02/07/2010 Document Revised: 01/26/2014 Document Reviewed: 07/24/2013 °Elsevier Interactive Patient Education ©2016 [employer] ° °

## 2015-03-29 NOTE — H&P (View-Only) (Signed)
Cardiology Office Note  Date: 03/27/2015   ID: [name], DOB April 29, 1950, MRN [id number]  PCP: Monico Blitz, MD  Primary Cardiologist: Rozann Lesches, MD   Chief Complaint  Patient presents with  . Follow-up cardiac testing    History of Present Illness: Christy [name] is a 65 y.o. female last seen in February 2017. She was referred for follow-up ischemic testing with progressive dyspnea and fatigue as well as intermittent exertional angina symptoms.  Lexiscan Cardiolite and echocardiogram reports are outlined below. In summary she has preserved LVEF with restrictive diastolic filling pattern an severe left atrial enlargement although only mildly increased PA systolic pressure. Ischemic defect was noted in the inferior wall, potentially also to some degree in the anterior wall although soft tissue attenuation also affected the study. TID ratio was 1.33.  Today we discussed the results of her testing in detail and the implications as it relates to potential progression in CAD over time in her symptoms. Some of her symptoms may certainly be related to diastolic dysfunction, however we discussed proceeding to a cardiac catheterization to better understand her coronary anatomy and assess for revascularization options. We discussed the potential risks and benefits, and she is in agreement to proceed.  We reviewed her medications, outlined below. At this point no changes are planned until after cardiac catheterization. She will continue present diuretic dose, hold for the morning of the procedure.  She has a history of renal insufficiency and type IV RTA, her last creatinine was 1.2.  Past Medical History  Diagnosis Date  . SVT (supraventricular tachycardia) ([place])   . Coronary atherosclerosis of native coronary artery     BMS circ 2006, 70% PDA 2009  . Renal tubular acidosis, type IV   . Mixed hyperlipidemia   . Type 2 diabetes mellitus ([place])   . Essential hypertension, benign   .  OSA on CPAP   . Bipolar disorder [facility])     Past Surgical History  Procedure Laterality Date  . Right shoulder adhesive capsulitis    . Rotator cuff impingement syndrome    . Rotator cuff tear    . Acromioclavicular arthritis      Current Outpatient Prescriptions  Medication Sig Dispense Refill  . allopurinol (ZYLOPRIM) 300 MG tablet Take 150 mg by mouth daily.    Marland Kitchen aspirin 81 MG EC tablet Take 81 mg by mouth daily.      Marland Kitchen buPROPion (WELLBUTRIN XL) 300 MG 24 hr tablet Take 300 mg by mouth daily.      . calcium carbonate (OS-CAL) 600 MG TABS Take 600 mg by mouth daily.      . Cholecalciferol (VITAMIN D) 400 UNITS capsule Take 400 Units by mouth daily.      Marland Kitchen diltiazem (CARDIZEM) 120 MG tablet Take 120 mg by mouth daily.      . divalproex (DEPAKOTE) 250 MG EC tablet Take 250 mg by mouth daily.      . furosemide (LASIX) 80 MG tablet Take 1 tablet (80 mg total) by mouth 2 (two) times daily. 60 tablet 6  . insulin aspart (NOVOLOG) 100 UNIT/ML injection Inject into the skin. Sliding scale 5 - 45 units as needed up to four times a day    . Insulin Glargine (TOUJEO SOLOSTAR [place]) Inject into the skin. 100 units AM and 90 units PM    . isosorbide mononitrate (IMDUR) 60 MG 24 hr tablet Take 1.5 tablets (90 mg total) by mouth daily. 45 tablet 6  . labetalol (  NORMODYNE) 200 MG tablet Take 2 tablets (400 mg total) by mouth 2 (two) times daily. 120 tablet 6  . Liraglutide (VICTOZA) 18 MG/3ML SOLN Inject 1.8 mg into the skin daily.     Marland Kitchen losartan (COZAAR) 25 MG tablet Take 25 mg by mouth daily.    . Multiple Vitamin (MULTIVITAMIN) tablet Take 1 tablet by mouth daily.      . nitroGLYCERIN (NITROLINGUAL) 0.4 MG/SPRAY spray Place 1 spray under the tongue every 5 (five) minutes x 3 doses as needed. 12 g 2  . pantoprazole (PROTONIX) 40 MG tablet Take 40 mg by mouth daily.      . rosuvastatin (CRESTOR) 10 MG tablet Take 10 mg by mouth daily.    . sertraline (ZOLOFT) 100 MG tablet Take 100 mg by mouth daily.       . traZODone (DESYREL) 50 MG tablet Take 50 mg by mouth daily.       No current facility-administered medications for this visit.   Allergies:  Ace inhibitors; Penicillins; Tetracycline; and Lamictal   Social History: The patient  reports that she quit smoking about 20 months ago. Her smoking use included Cigarettes. She started smoking about 30 years ago. She has a 20 pack-year smoking history. She has never used smokeless tobacco. She reports that she does not drink alcohol or use illicit drugs.   Family History: The patient's family history includes Bipolar disorder in her brother; Diabetes in her father.   ROS:  Please see the history of present illness. Otherwise, complete review of systems is positive for leg edema.  All other systems are reviewed and negative.   Physical Exam: VS:  BP 146/64 mmHg  Pulse 60  Ht 5\' 3"  (1.6 m)  Wt 262 lb (118.842 kg)  BMI 46.42 kg/m2  SpO2 95%, BMI Body mass index is 46.42 kg/(m^2).  Wt Readings from Last 3 Encounters:  03/27/15 262 lb (118.842 kg)  03/07/15 261 lb (118.389 kg)  01/09/14 248 lb (112.492 kg)    Obese woman, no distress.  HEENT: Conjunctiva and lids normal, oropharynx clear.  Neck: Supple, no elevated JVP or carotid bruits, no thyromegaly.  Lungs: Clear to auscultation, nonlabored breathing at rest.  Cardiac: Regular rate and rhythm, no S3, 2/6 systolic murmur, no pericardial rub.  Abdomen: Soft, nontender, bowel sounds present, no guarding or rebound.  Extremities: Ankle edema, distal pulses 2+. Skin: Warm and dry. Muscular skeletal: No kyphosis. Neuropsychiatric: Alert and oriented 3, somewhat anxious but affect appropriate.  ECG:  I personally reviewed the prior tracing from 03/07/2015 which showed sinus bradycardia with prolonged PR interval and IVCD.  Recent Labwork:  November 2016: BUN 31, creatinine1.2, potassium 5.0, hemoglobin 11.1  Other Studies Reviewed Today:  Lexiscan Cardiolite 03/19/2015:  No  diagnostic ST segment changes to indicate ischemia.  Small, moderate intensity, inferior defect that is reversible and consistent with ischemia most likely in the RCA distribution.  Medium, mild intensity, partially reversible anterior defect suggestive of variable soft tissue attenuation although mild ischemia not excluded.  This is an intermediate risk study.  Nuclear stress EF: 60%. TID ratio 1.33.  Echocardiogram 03/13/2015: Study Conclusions  - Left ventricle: The cavity size was normal. Wall thickness was  increased in a pattern of mild LVH. Systolic function was normal.  The estimated ejection fraction was in the range of 60% to 65%.  Wall motion was normal; there were no regional wall motion  abnormalities. Doppler parameters are consistent with restrictive  physiology, indicative of decreased left ventricular  diastolic  compliance and/or increased left atrial pressure. - Aortic valve: There was mild regurgitation. Valve area (VTI):  2.84 cm^2. Valve area (Vmax): 2.77 cm^2. Valve area (Vmean): 2.57  cm^2. - Mitral valve: Mildly calcified annulus. Mildly thickened leaflets  . There was mild regurgitation. - Left atrium: The atrium was severely dilated. - Right atrium: The atrium was mildly dilated. - Pulmonary arteries: Systolic pressure was mildly increased. PA  peak pressure: 37 mm Hg (S). - Technically difficult study.  Assessment and Plan:  1. Progressive dyspnea and fatigue as well as intermittent angina symptoms. CAD history includes BMS to the circumflex in 2006 with moderate PDA disease that was managed medically in 2009. Suspect multifactorial etiology with restrictive diastolic filling pattern by recent echocardiogram, obesity, however also evidence of ischemia by her recent Cardiolite study suggesting progressive CAD over the last 10 years. TID ratio was also increased which could point toward a multivessel process. In light of this, we discussed  proceeding to a diagnostic cardiac catheterization to clearly outline coronary anatomy and assess for revascularization options. We discussed the potential risks and benefits, and she is in agreement to proceed.  2. Essential hypertension, blood pressure has been elevated recently. She is currently on Cardizem CD, labetalol, Cozaar, and Lasix. Anticipate further medication adjustments once we have a better handle on her coronary anatomy.  3. Type 2 diabetes mellitus, followed by Dr. Manuella Ghazi. She is on both oral agents and insulin.  4. Morbid obesity.  5. History of renal insufficiency and type IV RTA, last creatinine was 1.2.  6. OSA on CPAP. She reports compliance.  7. Previous history of PSVT, quiescent.  Current medicines were reviewed with the patient today.  Disposition: FU with me after cardiac catheterization.   Signed, Satira Sark, MD, [facility] 03/27/2015 9:30 AM    [facility], [place], [place] [address] Phone: [phone]; Fax: [phone]

## 2015-03-29 NOTE — Interval H&P Note (Signed)
Cath Lab Visit (complete for each Cath Lab visit)  Clinical Evaluation Leading to the Procedure:   ACS: No.  Non-ACS:    Anginal Classification: CCS II  Anti-ischemic medical therapy: Maximal Therapy (2 or more classes of medications)  Non-Invasive Test Results: Intermediate-risk stress test findings: cardiac mortality 1-3%/year  Prior CABG: No previous CABG      History and Physical Interval Note:  03/29/2015 9:31 AM  Christy [name]  has presented today for surgery, with the diagnosis of cad w/ angina  The various methods of treatment have been discussed with the patient and family. After consideration of risks, benefits and other options for treatment, the patient has consented to  Procedure(s): Left Heart Cath and Coronary Angiography (N/A) as a surgical intervention .  The patient's history has been reviewed, patient examined, no change in status, stable for surgery.  I have reviewed the patient's chart and labs.  Questions were answered to the patient's satisfaction.     KELLY,THOMAS A

## 2015-04-01 ENCOUNTER — Telehealth: Payer: Self-pay | Admitting: Cardiology

## 2015-04-01 DIAGNOSIS — I5032 Chronic diastolic (congestive) heart failure: Secondary | ICD-10-CM

## 2015-04-01 NOTE — Telephone Encounter (Signed)
Christy [name] is requesting a sooner appointment than May 03, 2015. Christy [name] said that her SOB is worse than before cath especially when she is moving around. Christy [name] did not have sounds of sob while on the phone. No wheezing or panting heard by nurse. Christy [name] informed nurse that her weight has increased 3-4 lbs since last visit. Monday 03/25/15, weight was 262 lbs; on 03/30/15 weight was 263 lbs, and on today, weight is 264lbs. Christy [name] denied increased swelling in her feet. No c/o chest pain or dizziness. Christy [name]'s informed that her weight increase is 2 lbs since last week. Christy [name] confirmed that she takes furosemide 80 mg twice daily an no K+.  Christy [name] advised that this message would be reviewed by Dr. Domenic Polite on tomorrow to see if any medication adjustments were needed or if she needed an office visit with an extender. Christy [name] verbalized understanding of plan.

## 2015-04-01 NOTE — Telephone Encounter (Signed)
PATIENT experiencing SOB. Had cath and was told that she may need to do something about high BP with medication. Wanted to know if she needs to be seen before her April visit

## 2015-04-02 MED ORDER — TORSEMIDE 20 MG PO TABS: 60.0000 mg | ORAL_TABLET | Freq: Two times a day (BID) | ORAL | Status: DC

## 2015-04-02 NOTE — Telephone Encounter (Signed)
Patient informed and verbalized understanding of plan. Lab work will be done at [facility]. Lab order faxed to [facility] lab.

## 2015-04-02 NOTE — Telephone Encounter (Signed)
Her cardiac catheterization showed patent stent site with otherwise stable coronary findings. Focus now will be on further management of her diastolic heart failure. Switch from Lasix to Demadex at 60 mg twice daily. She should have a follow-up visit in a week with APP and a BMET at that time. Can then try to push Cozaar higher depending on renal function and potassium.

## 2015-04-03 ENCOUNTER — Telehealth: Payer: Self-pay | Admitting: *Deleted

## 2015-04-03 NOTE — Telephone Encounter (Signed)
We do need to make sure however that her renal function has not deteriorated with change in diuretics as this could potentially contribute to hypoglycemia by reducing clearance of her diabetic medications. She should have a BMET if not already scheduled.

## 2015-04-03 NOTE — Telephone Encounter (Signed)
Pt says she has take 2 doses of torsemide and that last night blood sugar dropped to 45 - she didn't take insulin before bedtime and ate light dinner. Pt thinks drop may be coming from torsemide (says she read that med can cause low blood sugar). Pt aware that this is not a common side affect of torsemide and to continue taking torsemide as directed unless further instructed by provider. Will forward

## 2015-04-03 NOTE — Telephone Encounter (Signed)
BMP was ordered per 3/14 telephone note, pt says she will have these done by this Friday.

## 2015-04-04 DIAGNOSIS — I5032 Chronic diastolic (congestive) heart failure: Secondary | ICD-10-CM | POA: Diagnosis not present

## 2015-04-04 NOTE — Telephone Encounter (Signed)
Patient informed and verbalized understanding of plan. 

## 2015-04-04 NOTE — Addendum Note (Signed)
Addended by: Merlene Laughter on: 04/04/2015 04:11 PM   Modules accepted: Orders, Medications

## 2015-04-04 NOTE — Telephone Encounter (Signed)
Pt calling back with blood sugar 77 last night and this morning 63 says this is to low for her and wants to know if she should stop taking torsemide. Will forward to Dr. Domenic Polite

## 2015-04-04 NOTE — Telephone Encounter (Signed)
Patient informed via phone call.

## 2015-04-04 NOTE — Telephone Encounter (Signed)
Have her cut Demadex to 40 mg twice daily and follow up for the BMET as already arranged.

## 2015-04-05 ENCOUNTER — Telehealth: Payer: Self-pay | Admitting: *Deleted

## 2015-04-05 NOTE — Telephone Encounter (Signed)
Patient informed via vm. 

## 2015-04-05 NOTE — Telephone Encounter (Signed)
-----   Message from Satira Sark, MD sent at 04/05/2015  3:08 PM EDT ----- Lab work reviewed. Creatinine is 1.7, potassium 5.0. No marked decline in renal function after recent switch in diuretics. I hope that she starts to feel better on the lower dose Demadex.

## 2015-04-10 ENCOUNTER — Encounter: Payer: Self-pay | Admitting: *Deleted

## 2015-04-10 ENCOUNTER — Encounter: Payer: Self-pay | Admitting: Physician Assistant

## 2015-04-10 ENCOUNTER — Ambulatory Visit (INDEPENDENT_AMBULATORY_CARE_PROVIDER_SITE_OTHER): Payer: Medicare Other | Admitting: Physician Assistant

## 2015-04-10 VITALS — BP 118/70 | HR 63 | Ht 63.0 in | Wt 248.0 lb

## 2015-04-10 DIAGNOSIS — I471 Supraventricular tachycardia: Secondary | ICD-10-CM

## 2015-04-10 DIAGNOSIS — I5032 Chronic diastolic (congestive) heart failure: Secondary | ICD-10-CM

## 2015-04-10 DIAGNOSIS — Z79899 Other long term (current) drug therapy: Secondary | ICD-10-CM

## 2015-04-10 DIAGNOSIS — I2510 Atherosclerotic heart disease of native coronary artery without angina pectoris: Secondary | ICD-10-CM

## 2015-04-10 DIAGNOSIS — I10 Essential (primary) hypertension: Secondary | ICD-10-CM | POA: Insufficient documentation

## 2015-04-10 NOTE — Assessment & Plan Note (Signed)
Prior BMS to the circumflex in 2006 with recent cardiac catheterization showing patent stent normal LAD and RCA.

## 2015-04-10 NOTE — Assessment & Plan Note (Signed)
Patient's heart failure has improved with the extra diuretic. She did have trouble with her blood sugars on higher dose Demadex and is now doing better on 40 mg twice a day. She has lost 14 pounds and decreased edema. She continues to struggle with low sodium diet. Have given her instructions on 2 g sodium diet. Continue to weigh yourself daily. Check renal function in 2-3 weeks and follow-up with Dr. Domenic Polite 05/03/15.

## 2015-04-10 NOTE — Assessment & Plan Note (Signed)
Blood pressure controlled. 

## 2015-04-10 NOTE — Patient Instructions (Signed)
Medication Instructions:  Your physician recommends that you continue on your current medications as directed. Please refer to the Current Medication list given to you today.   Labwork: Your physician recommends that you return for lab work in: 2-3 WEEKS  BMET   Testing/Procedures: NONE  Follow-Up: Your physician recommends that you schedule a follow-up appointment in: 4/14 @ 9:00  In [place]    Any Other Special Instructions Will Be Listed Below (If Applicable).     If you need a refill on your cardiac medications before your next appointment, please call your pharmacy.

## 2015-04-10 NOTE — Assessment & Plan Note (Signed)
Controlled with low-dose diltiazem and labetalol

## 2015-04-10 NOTE — Progress Notes (Signed)
Cardiology Office Note   Date:  04/10/2015   ID:  Christy [name], DOB Apr 23, 1950, MRN [id number]  PCP:  Christy Blitz, MD  Cardiologist:  Dr. Domenic [name]   Chief Complaint: Post hospital follow-up, edema    History of Present Illness: Christy [name] is a 65 y.o. female who presents for post hospital follow-up after recent cardiac catheterization because of abnormal nuclear stress test. 03/29/15 Showed No Significant Coronary Artery Obstructive Disease with Evidence of Widely Patent Stent in the Proximal Left Circumflex, Normal LAD and Normal Very Large Dominant RCA. Systemic Hypertension with Elevated Left Ventricular End-Diastolic Pressure 37 Mm. Recommendation Was for Increased Medical Therapy with More Optimal Blood Pressure Control and Potential Improvement in Restrictive Physiology with Grade 3 Diastolic Dysfunction. Abnormal Nuclear Stress Findings Were Felt Secondary to the Patient's Morbid Obesity and Body Habitus.  Creatinine was 1.35 before cath. her Lasix was changed to Demadex 60 mg twice a day. Follow-up creatinine was 1.7 and Demadex was decreased to 40 mg twice a day.   Patient has history of CAD status post BMS to the circumflex in 2006 with moderate PDA disease, hypertension, type 2 diabetes mellitus, morbid obesity and chronic renal insufficiency, OSA on CPap, and previous history of PSVT.   Patient comes in today accompanied by her daughter. She actually said she is disappointed she had a normal cath and wanted something to be wrong. She is feeling much better as far as her breathing is concerned and she's lost 14 pounds over the past couple weeks. She still has a little bit of edema in her ankles. She loves salt and has a hard time eating a low-sodium diet.  Past Medical History  Diagnosis Date  . SVT (supraventricular tachycardia) ([place])   . Coronary atherosclerosis of native coronary artery     BMS circ 2006, 70% PDA 2009  . Renal tubular acidosis, type IV   . Mixed  hyperlipidemia   . Type 2 diabetes mellitus ([place])   . Essential hypertension, benign   . OSA on CPAP   . Bipolar disorder [facility])     Past Surgical History  Procedure Laterality Date  . Right shoulder adhesive capsulitis    . Rotator cuff impingement syndrome    . Rotator cuff tear    . Acromioclavicular arthritis    . Cardiac catheterization N/A 03/29/2015    Procedure: Left Heart Cath and Coronary Angiography;  Surgeon: Christy Sine, MD;  Location: [place];  Service: Cardiovascular;  Laterality: N/A;     Current Outpatient Prescriptions  Medication Sig Dispense Refill  . allopurinol (ZYLOPRIM) 300 MG tablet Take 150 mg by mouth daily.    Marland Kitchen aspirin 81 MG EC tablet Take 81 mg by mouth daily.      Marland Kitchen buPROPion (WELLBUTRIN XL) 300 MG 24 hr tablet Take 300 mg by mouth daily.      . calcium carbonate (OS-CAL) 600 MG TABS Take 600 mg by mouth daily.      . Cholecalciferol (VITAMIN D) 400 UNITS capsule Take 400 Units by mouth daily.      Marland Kitchen diltiazem (CARDIZEM) 120 MG tablet Take 120 mg by mouth daily.      . divalproex (DEPAKOTE) 250 MG EC tablet Take 250 mg by mouth daily.      . insulin aspart (NOVOLOG) 100 UNIT/ML injection Inject into the skin. Sliding scale 5 - 45 units as needed up to four times a day    . Insulin Glargine (TOUJEO SOLOSTAR ) Inject  90-100 Units into the skin See admin instructions. 100 units AM and 90 units PM    . isosorbide mononitrate (IMDUR) 60 MG 24 hr tablet Take 1.5 tablets (90 mg total) by mouth daily. 45 tablet 6  . labetalol (NORMODYNE) 200 MG tablet Take 2 tablets (400 mg total) by mouth 2 (two) times daily. 120 tablet 6  . Liraglutide (VICTOZA) 18 MG/3ML SOLN Inject 1.8 mg into the skin daily.     Marland Kitchen losartan (COZAAR) 25 MG tablet Take 25 mg by mouth daily.    . Multiple Vitamin (MULTIVITAMIN) tablet Take 1 tablet by mouth daily.      . nitroGLYCERIN (NITROLINGUAL) 0.4 MG/SPRAY spray Place 1 spray under the tongue every 5 (five) minutes x 3  doses as needed. 12 g 2  . pantoprazole (PROTONIX) 40 MG tablet Take 40 mg by mouth daily.      . rosuvastatin (CRESTOR) 10 MG tablet Take 10 mg by mouth daily.    . sertraline (ZOLOFT) 100 MG tablet Take 100 mg by mouth daily.      Marland Kitchen torsemide (DEMADEX) 20 MG tablet Take 40 mg by mouth 2 (two) times daily. 04/04/15 dose decreased    . traZODone (DESYREL) 50 MG tablet Take 50 mg by mouth daily.       No current facility-administered medications for this visit.    Allergies:   Ace inhibitors; Penicillins; Tetracycline; and Lamictal    Social History:  The patient  reports that she quit smoking about 21 months ago. Her smoking use included Cigarettes. She started smoking about 30 years ago. She has a 20 pack-year smoking history. She has never used smokeless tobacco. She reports that she does not drink alcohol or use illicit drugs.   Family History:  The patient's family history includes Bipolar disorder in her brother; Diabetes in her father.    ROS:  Please see the history of present illness.   Otherwise, review of systems are positive for none.   All other systems are reviewed and negative.    PHYSICAL EXAM: VS:  BP 118/70 mmHg  Pulse 63  Ht 5\' 3"  (1.6 m)  Wt 248 lb (112.492 kg)  BMI 43.94 kg/m2  SpO2 93% , BMI Body mass index is 43.94 kg/(m^2). GEN: Obese, in no acute distress Neck: no JVD, HJR, carotid bruits, or masses Cardiac:  RRR; positive S4, no murmurs, rubs, thrill or heave,  Respiratory:  clear to auscultation bilaterally, normal work of breathing GI: soft, nontender, nondistended, + BS MS: no deformity or atrophy Extremities: Right radial artery at cath site without hematoma or hemorrhage good radial brachial pulses Trace of ankle edema bilaterally without cyanosis, clubbing,  good distal pulses bilaterally.  Skin: warm and dry, no rash Neuro:  Strength and sensation are intact    EKG:  EKG is not ordered today.   Recent Labs: 03/29/2015: BUN 43*; Creatinine,  Ser 1.35*; Hemoglobin 10.2*; Platelets 169; Potassium 5.0; Sodium 144    Lipid Panel    Component Value Date/Time   CHOL * 12/24/2007 0157    213        ATP III CLASSIFICATION:  <200     mg/dL   Desirable  200-239  mg/dL   Borderline High  >=240    mg/dL   High   TRIG 268* 12/24/2007 0157   HDL 42 12/24/2007 0157   CHOLHDL 5.1 12/24/2007 0157   VLDL 54* 12/24/2007 0157   LDLCALC * 12/24/2007 0157    117  Total Cholesterol/HDL:CHD Risk Coronary Heart Disease Risk Table                     Men   Women  1/2 Average Risk   3.4   3.3      Wt Readings from Last 3 Encounters:  04/10/15 248 lb (112.492 kg)  03/29/15 262 lb (118.842 kg)  03/27/15 262 lb (118.842 kg)      Other studies Reviewed: Additional studies/ records that were reviewed today include and review of the records demonstrates:  Cardiac cath 03/29/15 Conclusion     No significant coronary obstructive disease with evidence for widely patent stent in the proximal left circumflex artery, a normal LAD and a normal very large dominant RCA.  Systemic hypertension with elevated left ventricular end-diastolic pressure at 37 mm.  RECOMMENDATION: Increased medical therapy with more optimal blood pressure control and potential improvement in restrictive physiology with grade 3 diastolic dysfunction.  I suspect the abnormal nuclear findings were contributed by the patient's morbid obesity and body habitus.     Indications    Lexiscan Cardiolite 03/19/2015:  No diagnostic ST segment changes to indicate ischemia.  Small, moderate intensity, inferior defect that is reversible and consistent with ischemia most likely in the RCA distribution.  Medium, mild intensity, partially reversible anterior defect suggestive of variable soft tissue attenuation although mild ischemia not excluded.  This is an intermediate risk study.  Nuclear stress EF: 60%. TID ratio 1.33.  Echocardiogram 03/13/2015: Study Conclusions   -  Left ventricle: The cavity size was normal. Wall thickness was   increased in a pattern of mild LVH. Systolic function was normal.   The estimated ejection fraction was in the range of 60% to 65%.   Wall motion was normal; there were no regional wall motion   abnormalities. Doppler parameters are consistent with restrictive   physiology, indicative of decreased left ventricular diastolic   compliance and/or increased left atrial pressure. - Aortic valve: There was mild regurgitation. Valve area (VTI):   2.84 cm^2. Valve area (Vmax): 2.77 cm^2. Valve area (Vmean): 2.57   cm^2. - Mitral valve: Mildly calcified annulus. Mildly thickened leaflets   . There was mild regurgitation. - Left atrium: The atrium was severely dilated. - Right atrium: The atrium was mildly dilated. - Pulmonary arteries: Systolic pressure was mildly increased. PA   peak pressure: 37 mm Hg (S). - Technically difficult study.    ASSESSMENT AND PLAN:  CHRONIC DIASTOLIC HEART FAILURE Patient's heart failure has improved with the extra diuretic. She did have trouble with her blood sugars on higher dose Demadex and is now doing better on 40 mg twice a day. She has lost 14 pounds and decreased edema. She continues to struggle with low sodium diet. Have given her instructions on 2 g sodium diet. Continue to weigh yourself daily. Check renal function in 2-3 weeks and follow-up with Dr. Domenic [name] 05/03/15.  CORONARY ATHEROSCLEROSIS NATIVE CORONARY ARTERY Prior BMS to the circumflex in 2006 with recent cardiac catheterization showing patent stent normal LAD and RCA.  SVT (supraventricular tachycardia) Controlled with low-dose diltiazem and labetalol  Essential hypertension Blood pressure controlled    Signed, Ermalinda Barrios, PA-C  04/10/2015 1:16 PM    [facility] [place], [place], [place]  [address] Phone: [phone]; Fax: [phone]

## 2015-04-12 DIAGNOSIS — H25011 Cortical age-related cataract, right eye: Secondary | ICD-10-CM | POA: Diagnosis not present

## 2015-04-12 DIAGNOSIS — H2512 Age-related nuclear cataract, left eye: Secondary | ICD-10-CM | POA: Diagnosis not present

## 2015-04-12 DIAGNOSIS — E11319 Type 2 diabetes mellitus with unspecified diabetic retinopathy without macular edema: Secondary | ICD-10-CM | POA: Diagnosis not present

## 2015-04-12 DIAGNOSIS — H25012 Cortical age-related cataract, left eye: Secondary | ICD-10-CM | POA: Diagnosis not present

## 2015-04-12 DIAGNOSIS — H2511 Age-related nuclear cataract, right eye: Secondary | ICD-10-CM | POA: Diagnosis not present

## 2015-04-26 DIAGNOSIS — Z79899 Other long term (current) drug therapy: Secondary | ICD-10-CM | POA: Diagnosis not present

## 2015-04-30 DIAGNOSIS — Z299 Encounter for prophylactic measures, unspecified: Secondary | ICD-10-CM | POA: Diagnosis not present

## 2015-04-30 DIAGNOSIS — E1142 Type 2 diabetes mellitus with diabetic polyneuropathy: Secondary | ICD-10-CM | POA: Diagnosis not present

## 2015-04-30 DIAGNOSIS — R609 Edema, unspecified: Secondary | ICD-10-CM | POA: Diagnosis not present

## 2015-04-30 DIAGNOSIS — I509 Heart failure, unspecified: Secondary | ICD-10-CM | POA: Diagnosis not present

## 2015-05-01 ENCOUNTER — Encounter: Payer: Self-pay | Admitting: Physician Assistant

## 2015-05-03 ENCOUNTER — Encounter: Payer: Self-pay | Admitting: Cardiology

## 2015-05-03 ENCOUNTER — Ambulatory Visit (INDEPENDENT_AMBULATORY_CARE_PROVIDER_SITE_OTHER): Payer: Medicare Other | Admitting: Cardiology

## 2015-05-03 VITALS — BP 138/58 | HR 59 | Ht 63.0 in | Wt 240.0 lb

## 2015-05-03 DIAGNOSIS — N289 Disorder of kidney and ureter, unspecified: Secondary | ICD-10-CM | POA: Diagnosis not present

## 2015-05-03 DIAGNOSIS — I5032 Chronic diastolic (congestive) heart failure: Secondary | ICD-10-CM

## 2015-05-03 DIAGNOSIS — I10 Essential (primary) hypertension: Secondary | ICD-10-CM

## 2015-05-03 DIAGNOSIS — I2510 Atherosclerotic heart disease of native coronary artery without angina pectoris: Secondary | ICD-10-CM

## 2015-05-03 DIAGNOSIS — N189 Chronic kidney disease, unspecified: Secondary | ICD-10-CM

## 2015-05-03 MED ORDER — TORSEMIDE 20 MG PO TABS: 40.0000 mg | ORAL_TABLET | ORAL | Status: DC

## 2015-05-03 NOTE — Patient Instructions (Signed)
   Decrease Demadex to 40mg  every morning. Continue all other medications.   Lab for BMET - do just prior to next office visit - order given today. Follow up in  1 month.

## 2015-05-03 NOTE — Progress Notes (Signed)
Cardiology Office Note  Date: 05/03/2015   ID: Christy [name], DOB 20-Apr-1950, MRN [id number]  PCP: Monico Blitz, MD  Primary Cardiologist: Rozann Lesches, MD   Chief Complaint  Patient presents with  . Diastolic heart failure  . Coronary Artery Disease    History of Present Illness: Christy [name] is a 65 y.o. female seen most recently by Ms. Vita Barley in late March. He was doing better at that times in terms of dyspnea and had lost a significant amount of weight. Diuretic therapy had been changed from Lasix to Demadex. She presents today stating that she still feels like things are better overall. She has lost an additional 8 pounds.  Cardiac catheterization results are outlined below, overall reassuring findings. She does not report symptoms.  I reviewed her recent follow-up lab work, creatinine has gone up to 2.2. We discussed this today as well as a plan to reduce her Demadex dose.  We also talked about a basic exercise plan such as walking regimen. She has not been very active.  Past Medical History  Diagnosis Date  . SVT (supraventricular tachycardia) ([place])   . Coronary atherosclerosis of native coronary artery     BMS circ 2006, 70% PDA 2009  . Renal tubular acidosis, type IV   . Mixed hyperlipidemia   . Type 2 diabetes mellitus ([place])   . Essential hypertension, benign   . OSA on CPAP   . Bipolar disorder [facility])     Current Outpatient Prescriptions  Medication Sig Dispense Refill  . allopurinol (ZYLOPRIM) 300 MG tablet Take 150 mg by mouth daily.    Marland Kitchen aspirin 81 MG EC tablet Take 81 mg by mouth daily.      Marland Kitchen buPROPion (WELLBUTRIN XL) 300 MG 24 hr tablet Take 300 mg by mouth daily.      . calcium carbonate (OS-CAL) 600 MG TABS Take 600 mg by mouth daily.      . Cholecalciferol (VITAMIN D) 400 UNITS capsule Take 400 Units by mouth daily.      Marland Kitchen diltiazem (CARDIZEM) 120 MG tablet Take 120 mg by mouth daily.      . divalproex (DEPAKOTE) 250 MG EC tablet Take 250  mg by mouth daily.      . insulin aspart (NOVOLOG) 100 UNIT/ML injection Inject into the skin. Sliding scale 5 - 45 units as needed up to four times a day    . Insulin Glargine (TOUJEO SOLOSTAR [place]) Inject 90-100 Units into the skin See admin instructions. 100 units AM and 90 units PM    . isosorbide mononitrate (IMDUR) 60 MG 24 hr tablet Take 1.5 tablets (90 mg total) by mouth daily. 45 tablet 6  . labetalol (NORMODYNE) 200 MG tablet Take 2 tablets (400 mg total) by mouth 2 (two) times daily. 120 tablet 6  . Liraglutide (VICTOZA) 18 MG/3ML SOLN Inject 1.8 mg into the skin daily.     Marland Kitchen losartan (COZAAR) 25 MG tablet Take 25 mg by mouth daily.    . Multiple Vitamin (MULTIVITAMIN) tablet Take 1 tablet by mouth daily.      . nitroGLYCERIN (NITROLINGUAL) 0.4 MG/SPRAY spray Place 1 spray under the tongue every 5 (five) minutes x 3 doses as needed. 12 g 2  . pantoprazole (PROTONIX) 40 MG tablet Take 40 mg by mouth daily.      Marland Kitchen POLYETHYLENE GLYCOL 3350 PO Take by mouth daily.    . rosuvastatin (CRESTOR) 10 MG tablet Take 10 mg by mouth daily.    Marland Kitchen  sertraline (ZOLOFT) 100 MG tablet Take 100 mg by mouth daily.      Marland Kitchen torsemide (DEMADEX) 20 MG tablet Take 2 tablets (40 mg total) by mouth every morning.    . traZODone (DESYREL) 50 MG tablet Take 50 mg by mouth daily.       No current facility-administered medications for this visit.   Allergies:  Ace inhibitors; Penicillins; Tetracycline; and Lamictal   Social History: The patient  reports that she quit smoking about 21 months ago. Her smoking use included Cigarettes. She started smoking about 30 years ago. She has a 20 pack-year smoking history. She has never used smokeless tobacco. She reports that she does not drink alcohol or use illicit drugs.   Family History: The patient's family history includes Bipolar disorder in her brother; Diabetes in her father.   ROS:  Please see the history of present illness. Otherwise, complete review of systems is  positive for NYHA class II dyspnea.  All other systems are reviewed and negative.   Physical Exam: VS:  BP 138/58 mmHg  Pulse 59  Ht 5\' 3"  (1.6 m)  Wt 240 lb (108.863 kg)  BMI 42.52 kg/m2  SpO2 93%, BMI Body mass index is 42.52 kg/(m^2).  Wt Readings from Last 3 Encounters:  05/03/15 240 lb (108.863 kg)  04/10/15 248 lb (112.492 kg)  03/29/15 262 lb (118.842 kg)    Obese woman, no distress.  HEENT: Conjunctiva and lids normal, oropharynx clear.  Neck: Supple, no elevated JVP or carotid bruits, no thyromegaly.  Lungs: Clear to auscultation, nonlabored breathing at rest.  Cardiac: Regular rate and rhythm, no S3, 2/6 systolic murmur, no pericardial rub.  Abdomen: Soft, nontender, bowel sounds present, no guarding or rebound.  Extremities: Ankle edema, distal pulses 2+. Skin: Warm and dry. Muscular skeletal: No kyphosis. Neuropsychiatric: Alert and oriented 3, somewhat anxious but affect appropriate.  ECG: I personally reviewed the prior tracing from 03/07/2015 which showed sinus bradycardia with prolonged PR interval and IVCD.  Recent Labwork: 03/29/2015: BUN 43*; Creatinine, Ser 1.35*; Hemoglobin 10.2*; Platelets 169; Potassium 5.0; Sodium 144  April 2017: BUN 88, creatinine 2.2, potassium 4.5  Other Studies Reviewed Today:  Cardiac catheterization 03/29/2015: Conclusion    No significant coronary obstructive disease with evidence for widely patent stent in the proximal left circumflex artery, a normal LAD and a normal very large dominant RCA.  Systemic hypertension with elevated left ventricular end-diastolic pressure at 37 mm.  RECOMMENDATION: Increased medical therapy with more optimal blood pressure control and potential improvement in restrictive physiology with grade 3 diastolic dysfunction. I suspect the abnormal nuclear findings were contributed by the patient's morbid obesity and body habitus.     Echocardiogram 03/13/2015: Study Conclusions  - Left  ventricle: The cavity size was normal. Wall thickness was  increased in a pattern of mild LVH. Systolic function was normal.  The estimated ejection fraction was in the range of 60% to 65%.  Wall motion was normal; there were no regional wall motion  abnormalities. Doppler parameters are consistent with restrictive  physiology, indicative of decreased left ventricular diastolic  compliance and/or increased left atrial pressure. - Aortic valve: There was mild regurgitation. Valve area (VTI):  2.84 cm^2. Valve area (Vmax): 2.77 cm^2. Valve area (Vmean): 2.57  cm^2. - Mitral valve: Mildly calcified annulus. Mildly thickened leaflets  . There was mild regurgitation. - Left atrium: The atrium was severely dilated. - Right atrium: The atrium was mildly dilated. - Pulmonary arteries: Systolic pressure was mildly increased.  PA  peak pressure: 37 mm Hg (S). - Technically difficult study.  Assessment and Plan:  1. Chronic diastolic heart failure, symptomatically improved following significant diuresis of approximately 22 pounds overall. We will reduce her Demadex to 40 mg daily, recheck BMET in one month with clinical visit. I encouraged her to continue to limit sodium in her diet and consider walking regimen for exercise.  2. CAD status post BMS to the circumflex in 2006, coronary anatomy stable by recent cardiac catheterization. Plan to continue medical therapy and observation.  3. Acute on chronic renal insufficiency, creatinine up to 2.2 from 1.3. Demadex dose being reduced. Follow-up BMET for next visit.  4. Essential hypertension, blood pressure control is adequate today.  Current medicines were reviewed with the patient today.  Disposition: FU with me in 1 month.   Signed, Satira Sark, MD, [facility] 05/03/2015 9:26 AM    [facility] [place], [place], [place] [address] Phone: [phone]; Fax: [phone]

## 2015-05-14 DIAGNOSIS — I2510 Atherosclerotic heart disease of native coronary artery without angina pectoris: Secondary | ICD-10-CM | POA: Diagnosis not present

## 2015-05-14 DIAGNOSIS — I10 Essential (primary) hypertension: Secondary | ICD-10-CM | POA: Diagnosis not present

## 2015-05-14 DIAGNOSIS — E7800 Pure hypercholesterolemia, unspecified: Secondary | ICD-10-CM | POA: Diagnosis not present

## 2015-05-14 DIAGNOSIS — E119 Type 2 diabetes mellitus without complications: Secondary | ICD-10-CM | POA: Diagnosis not present

## 2015-05-28 DIAGNOSIS — I10 Essential (primary) hypertension: Secondary | ICD-10-CM | POA: Diagnosis not present

## 2015-05-28 DIAGNOSIS — I5032 Chronic diastolic (congestive) heart failure: Secondary | ICD-10-CM | POA: Diagnosis not present

## 2015-05-30 ENCOUNTER — Telehealth: Payer: Self-pay | Admitting: *Deleted

## 2015-05-30 DIAGNOSIS — I509 Heart failure, unspecified: Secondary | ICD-10-CM | POA: Diagnosis not present

## 2015-05-30 DIAGNOSIS — Z299 Encounter for prophylactic measures, unspecified: Secondary | ICD-10-CM | POA: Diagnosis not present

## 2015-05-30 DIAGNOSIS — F319 Bipolar disorder, unspecified: Secondary | ICD-10-CM | POA: Diagnosis not present

## 2015-05-30 DIAGNOSIS — E1142 Type 2 diabetes mellitus with diabetic polyneuropathy: Secondary | ICD-10-CM | POA: Diagnosis not present

## 2015-05-30 DIAGNOSIS — E7800 Pure hypercholesterolemia, unspecified: Secondary | ICD-10-CM | POA: Diagnosis not present

## 2015-05-30 NOTE — Telephone Encounter (Signed)
Patient informed. 

## 2015-05-30 NOTE — Telephone Encounter (Signed)
-----   Message from Satira Sark, MD sent at 05/28/2015  4:45 PM EDT ----- Reviewed. Creatinine has come down from 2.2-1.6 following reduction in Demadex. Continue same for now.

## 2015-05-31 DIAGNOSIS — E7800 Pure hypercholesterolemia, unspecified: Secondary | ICD-10-CM | POA: Diagnosis not present

## 2015-05-31 DIAGNOSIS — I10 Essential (primary) hypertension: Secondary | ICD-10-CM | POA: Diagnosis not present

## 2015-05-31 DIAGNOSIS — E119 Type 2 diabetes mellitus without complications: Secondary | ICD-10-CM | POA: Diagnosis not present

## 2015-05-31 DIAGNOSIS — I2510 Atherosclerotic heart disease of native coronary artery without angina pectoris: Secondary | ICD-10-CM | POA: Diagnosis not present

## 2015-06-03 ENCOUNTER — Ambulatory Visit (INDEPENDENT_AMBULATORY_CARE_PROVIDER_SITE_OTHER): Payer: Medicare Other | Admitting: Cardiology

## 2015-06-03 ENCOUNTER — Encounter: Payer: Self-pay | Admitting: Cardiology

## 2015-06-03 VITALS — BP 122/60 | HR 51 | Ht 63.0 in | Wt 236.0 lb

## 2015-06-03 DIAGNOSIS — I5032 Chronic diastolic (congestive) heart failure: Secondary | ICD-10-CM | POA: Diagnosis not present

## 2015-06-03 DIAGNOSIS — N183 Chronic kidney disease, stage 3 (moderate): Secondary | ICD-10-CM

## 2015-06-03 DIAGNOSIS — I2510 Atherosclerotic heart disease of native coronary artery without angina pectoris: Secondary | ICD-10-CM | POA: Diagnosis not present

## 2015-06-03 NOTE — Progress Notes (Signed)
Cardiology Office Note  Date: 06/03/2015   ID: PARNELL ELDER, DOB 14-Oct-1950, MRN [id number]  PCP: Monico Blitz, MD  Primary Cardiologist: Rozann Lesches, MD   Chief Complaint  Patient presents with  . Diastolic heart failure  . Coronary Artery Disease    History of Present Illness: Christy [name] is a 65 y.o. female last seen in April. She presents for a follow-up visit, continues to do well. She is following her diet and fluid intake carefully, weight actually continues to come down a few more pounds. She reports NYHA class II dyspnea, no chest pain.  She had recent follow-up lab work that showed improvement in creatinine from 2.2 down to 1.6.  Past Medical History  Diagnosis Date  . SVT (supraventricular tachycardia) ([place])   . Coronary atherosclerosis of native coronary artery     BMS circ 2006, 70% PDA 2009  . Renal tubular acidosis, type IV   . Mixed hyperlipidemia   . Type 2 diabetes mellitus ([place])   . Essential hypertension, benign   . OSA on CPAP   . Bipolar disorder [facility])     Current Outpatient Prescriptions  Medication Sig Dispense Refill  . allopurinol (ZYLOPRIM) 300 MG tablet Take 150 mg by mouth daily.    Marland Kitchen aspirin 81 MG EC tablet Take 81 mg by mouth daily.      Marland Kitchen buPROPion (WELLBUTRIN XL) 300 MG 24 hr tablet Take 300 mg by mouth daily.      . calcium carbonate (OS-CAL) 600 MG TABS Take 600 mg by mouth daily.      . Cholecalciferol (VITAMIN D) 400 UNITS capsule Take 400 Units by mouth daily.      Marland Kitchen diltiazem (CARDIZEM) 120 MG tablet Take 120 mg by mouth daily.      . divalproex (DEPAKOTE) 250 MG EC tablet Take 250 mg by mouth daily.      . insulin aspart (NOVOLOG) 100 UNIT/ML injection Inject into the skin. Sliding scale 5 - 45 units as needed up to four times a day    . Insulin Glargine (TOUJEO SOLOSTAR [place]) Inject 90-100 Units into the skin See admin instructions. 100 units AM and 90 units PM    . isosorbide mononitrate (IMDUR) 60 MG 24 hr tablet Take  1.5 tablets (90 mg total) by mouth daily. 45 tablet 6  . labetalol (NORMODYNE) 200 MG tablet Take 2 tablets (400 mg total) by mouth 2 (two) times daily. 120 tablet 6  . Liraglutide (VICTOZA) 18 MG/3ML SOLN Inject 1.8 mg into the skin daily.     Marland Kitchen losartan (COZAAR) 25 MG tablet Take 25 mg by mouth daily.    . Multiple Vitamin (MULTIVITAMIN) tablet Take 1 tablet by mouth daily.      . nitroGLYCERIN (NITROLINGUAL) 0.4 MG/SPRAY spray Place 1 spray under the tongue every 5 (five) minutes x 3 doses as needed. 12 g 2  . pantoprazole (PROTONIX) 40 MG tablet Take 40 mg by mouth daily.      Marland Kitchen POLYETHYLENE GLYCOL 3350 PO Take by mouth daily.    . rosuvastatin (CRESTOR) 10 MG tablet Take 10 mg by mouth daily.    . sertraline (ZOLOFT) 100 MG tablet Take 100 mg by mouth daily.      Marland Kitchen torsemide (DEMADEX) 20 MG tablet Take 2 tablets (40 mg total) by mouth every morning.    . traZODone (DESYREL) 50 MG tablet Take 50 mg by mouth daily.       No current facility-administered medications for  this visit.   Allergies:  Ace inhibitors; Penicillins; Tetracycline; and Lamictal   Social History: The patient  reports that she quit smoking about 22 months ago. Her smoking use included Cigarettes. She started smoking about 30 years ago. She has a 20 pack-year smoking history. She has never used smokeless tobacco. She reports that she does not drink alcohol or use illicit drugs.   ROS:  Please see the history of present illness. Otherwise, complete review of systems is positive for none.  All other systems are reviewed and negative.   Physical Exam: VS:  BP 122/60 mmHg  Pulse 51  Ht 5\' 3"  (1.6 m)  Wt 236 lb (107.049 kg)  BMI 41.82 kg/m2  SpO2 95%, BMI Body mass index is 41.82 kg/(m^2).  Wt Readings from Last 3 Encounters:  06/03/15 236 lb (107.049 kg)  05/03/15 240 lb (108.863 kg)  04/10/15 248 lb (112.492 kg)    Obese woman, no distress.  HEENT: Conjunctiva and lids normal, oropharynx clear.  Neck: Supple,  no elevated JVP or carotid bruits, no thyromegaly.  Lungs: Clear to auscultation, nonlabored breathing at rest.  Cardiac: Regular rate and rhythm, no S3, 2/6 systolic murmur, no pericardial rub.  Abdomen: Soft, nontender, bowel sounds present, no guarding or rebound.  Extremities: Ankle edema, distal pulses 2+.  ECG: I personally reviewed the prior tracing from 03/07/2015 which showed sinus bradycardia with prolonged PR interval and IVCD.  Recent Labwork: 03/29/2015: BUN 43*; Creatinine, Ser 1.35*; Hemoglobin 10.2*; Platelets 169; Potassium 5.0; Sodium 144  May 2017: BUN 52, creatinine 1.6, potassium 5.0  Other Studies Reviewed Today:  Cardiac catheterization 03/29/2015: Conclusion    No significant coronary obstructive disease with evidence for widely patent stent in the proximal left circumflex artery, a normal LAD and a normal very large dominant RCA.  Systemic hypertension with elevated left ventricular end-diastolic pressure at 37 mm.  RECOMMENDATION: Increased medical therapy with more optimal blood pressure control and potential improvement in restrictive physiology with grade 3 diastolic dysfunction. I suspect the abnormal nuclear findings were contributed by the patient's morbid obesity and body habitus.     Echocardiogram 03/13/2015: Study Conclusions  - Left ventricle: The cavity size was normal. Wall thickness was  increased in a pattern of mild LVH. Systolic function was normal.  The estimated ejection fraction was in the range of 60% to 65%.  Wall motion was normal; there were no regional wall motion  abnormalities. Doppler parameters are consistent with restrictive  physiology, indicative of decreased left ventricular diastolic  compliance and/or increased left atrial pressure. - Aortic valve: There was mild regurgitation. Valve area (VTI):  2.84 cm^2. Valve area (Vmax): 2.77 cm^2. Valve area (Vmean): 2.57  cm^2. - Mitral valve: Mildly calcified  annulus. Mildly thickened leaflets  . There was mild regurgitation. - Left atrium: The atrium was severely dilated. - Right atrium: The atrium was mildly dilated. - Pulmonary arteries: Systolic pressure was mildly increased. PA  peak pressure: 37 mm Hg (S). - Technically difficult study.       Assessment and Plan:  1. Chronic diastolic heart failure, symptomatically much improved on current medical regimen. We will keep Demadex at 40 mg daily, can take an additional 20 mg as needed for weight gain of 3 pounds in 24 hours.  2. CAD status post BMS to circumflex in 2016, stable coronary anatomy by recent cardiac catheterization.  3. CKD stage III, recent follow-up creatinine 1.6. Recheck BMET for next visit.  Current medicines were reviewed with the  patient today.  Disposition: FU with me in 6 months.   Signed, Satira Sark, MD, [facility] 06/03/2015 10:37 AM    [facility] [place], [place], [place] [address] Phone: [phone]; Fax: [phone]

## 2015-06-03 NOTE — Patient Instructions (Signed)
Continue all current medications. Lab for BMET - do just prior to next visit Your physician wants you to follow up in: 6 months.  You will receive a reminder letter in the mail one-two months in advance.  If you don't receive a letter, please call our office to schedule the follow up appointment

## 2015-06-06 DIAGNOSIS — L609 Nail disorder, unspecified: Secondary | ICD-10-CM | POA: Diagnosis not present

## 2015-06-06 DIAGNOSIS — L110 Acquired keratosis follicularis: Secondary | ICD-10-CM | POA: Diagnosis not present

## 2015-06-06 DIAGNOSIS — E1140 Type 2 diabetes mellitus with diabetic neuropathy, unspecified: Secondary | ICD-10-CM | POA: Diagnosis not present

## 2015-06-25 DIAGNOSIS — H2512 Age-related nuclear cataract, left eye: Secondary | ICD-10-CM | POA: Diagnosis not present

## 2015-06-26 DIAGNOSIS — H2511 Age-related nuclear cataract, right eye: Secondary | ICD-10-CM | POA: Diagnosis not present

## 2015-06-26 DIAGNOSIS — H25011 Cortical age-related cataract, right eye: Secondary | ICD-10-CM | POA: Diagnosis not present

## 2015-07-08 DIAGNOSIS — Z299 Encounter for prophylactic measures, unspecified: Secondary | ICD-10-CM | POA: Diagnosis not present

## 2015-07-08 DIAGNOSIS — E7800 Pure hypercholesterolemia, unspecified: Secondary | ICD-10-CM | POA: Diagnosis not present

## 2015-07-08 DIAGNOSIS — E1142 Type 2 diabetes mellitus with diabetic polyneuropathy: Secondary | ICD-10-CM | POA: Diagnosis not present

## 2015-07-08 DIAGNOSIS — I509 Heart failure, unspecified: Secondary | ICD-10-CM | POA: Diagnosis not present

## 2015-07-09 DIAGNOSIS — H2511 Age-related nuclear cataract, right eye: Secondary | ICD-10-CM | POA: Diagnosis not present

## 2015-07-30 DIAGNOSIS — I129 Hypertensive chronic kidney disease with stage 1 through stage 4 chronic kidney disease, or unspecified chronic kidney disease: Secondary | ICD-10-CM | POA: Diagnosis not present

## 2015-07-30 DIAGNOSIS — D509 Iron deficiency anemia, unspecified: Secondary | ICD-10-CM | POA: Diagnosis not present

## 2015-07-30 DIAGNOSIS — Z79899 Other long term (current) drug therapy: Secondary | ICD-10-CM | POA: Diagnosis not present

## 2015-07-30 DIAGNOSIS — E1129 Type 2 diabetes mellitus with other diabetic kidney complication: Secondary | ICD-10-CM | POA: Diagnosis not present

## 2015-07-30 DIAGNOSIS — R809 Proteinuria, unspecified: Secondary | ICD-10-CM | POA: Diagnosis not present

## 2015-07-30 DIAGNOSIS — N183 Chronic kidney disease, stage 3 (moderate): Secondary | ICD-10-CM | POA: Diagnosis not present

## 2015-07-30 DIAGNOSIS — I509 Heart failure, unspecified: Secondary | ICD-10-CM | POA: Diagnosis not present

## 2015-08-06 DIAGNOSIS — I10 Essential (primary) hypertension: Secondary | ICD-10-CM | POA: Diagnosis not present

## 2015-08-06 DIAGNOSIS — E1129 Type 2 diabetes mellitus with other diabetic kidney complication: Secondary | ICD-10-CM | POA: Diagnosis not present

## 2015-08-06 DIAGNOSIS — N184 Chronic kidney disease, stage 4 (severe): Secondary | ICD-10-CM | POA: Diagnosis not present

## 2015-08-06 DIAGNOSIS — R809 Proteinuria, unspecified: Secondary | ICD-10-CM | POA: Diagnosis not present

## 2015-08-06 DIAGNOSIS — I509 Heart failure, unspecified: Secondary | ICD-10-CM | POA: Diagnosis not present

## 2015-08-06 DIAGNOSIS — N250 Renal osteodystrophy: Secondary | ICD-10-CM | POA: Diagnosis not present

## 2015-08-06 DIAGNOSIS — E872 Acidosis: Secondary | ICD-10-CM | POA: Diagnosis not present

## 2015-08-15 DIAGNOSIS — I10 Essential (primary) hypertension: Secondary | ICD-10-CM | POA: Diagnosis not present

## 2015-08-15 DIAGNOSIS — E119 Type 2 diabetes mellitus without complications: Secondary | ICD-10-CM | POA: Diagnosis not present

## 2015-08-15 DIAGNOSIS — I2510 Atherosclerotic heart disease of native coronary artery without angina pectoris: Secondary | ICD-10-CM | POA: Diagnosis not present

## 2015-08-15 DIAGNOSIS — E7800 Pure hypercholesterolemia, unspecified: Secondary | ICD-10-CM | POA: Diagnosis not present

## 2015-08-29 DIAGNOSIS — E1140 Type 2 diabetes mellitus with diabetic neuropathy, unspecified: Secondary | ICD-10-CM | POA: Diagnosis not present

## 2015-08-29 DIAGNOSIS — L110 Acquired keratosis follicularis: Secondary | ICD-10-CM | POA: Diagnosis not present

## 2015-08-29 DIAGNOSIS — L609 Nail disorder, unspecified: Secondary | ICD-10-CM | POA: Diagnosis not present

## 2015-09-03 DIAGNOSIS — E1365 Other specified diabetes mellitus with hyperglycemia: Secondary | ICD-10-CM | POA: Diagnosis not present

## 2015-09-03 DIAGNOSIS — E1322 Other specified diabetes mellitus with diabetic chronic kidney disease: Secondary | ICD-10-CM | POA: Diagnosis not present

## 2015-09-03 DIAGNOSIS — N183 Chronic kidney disease, stage 3 (moderate): Secondary | ICD-10-CM | POA: Diagnosis not present

## 2015-09-03 DIAGNOSIS — F319 Bipolar disorder, unspecified: Secondary | ICD-10-CM | POA: Diagnosis not present

## 2015-09-03 DIAGNOSIS — Z299 Encounter for prophylactic measures, unspecified: Secondary | ICD-10-CM | POA: Diagnosis not present

## 2015-09-04 ENCOUNTER — Ambulatory Visit (INDEPENDENT_AMBULATORY_CARE_PROVIDER_SITE_OTHER): Payer: Medicare Other | Admitting: Ophthalmology

## 2015-09-04 DIAGNOSIS — E11319 Type 2 diabetes mellitus with unspecified diabetic retinopathy without macular edema: Secondary | ICD-10-CM | POA: Diagnosis not present

## 2015-09-04 DIAGNOSIS — H35033 Hypertensive retinopathy, bilateral: Secondary | ICD-10-CM

## 2015-09-04 DIAGNOSIS — H43813 Vitreous degeneration, bilateral: Secondary | ICD-10-CM | POA: Diagnosis not present

## 2015-09-04 DIAGNOSIS — E113393 Type 2 diabetes mellitus with moderate nonproliferative diabetic retinopathy without macular edema, bilateral: Secondary | ICD-10-CM

## 2015-09-04 DIAGNOSIS — I10 Essential (primary) hypertension: Secondary | ICD-10-CM | POA: Diagnosis not present

## 2015-09-05 ENCOUNTER — Telehealth: Payer: Self-pay | Admitting: Cardiology

## 2015-09-05 NOTE — Telephone Encounter (Signed)
Christy [name] called stating that she has been very weak for 2 weeks now. States that her BP is running low.  She saw Dr.Shah 09-03-15 and was told that she might have to have a medication adjustment.

## 2015-09-05 NOTE — Telephone Encounter (Signed)
Patient c/o of having low BP readings for the past 2 weeks. Patient also said that she feels very weak when this happens almost like she is going to pass out. Per patient her BP readings at home have been ranging around 103/50 & 101/50 and today BP was 111/44 with home wrist cuff. Patient was seen at her PCP's office (Dr. Manuella Ghazi) Tuesday 09/03/15 and BP was 160/61. Patient said she talked with him about this problem and he told patient to contact our office so that Dr. Domenic Polite could adjust her medications. No chest pain or sob. Patient advised that she needed to have her home monitor checked for accuracy and then she could start keeping a BP log, checking same arm, using same cuff and checking her BP at the same time of the day each time. Patient was given an appointment for a nurse BP check next Wednesday and was advised that if her symptoms get return or get worse she could go to the ED for an evaluation. Patient verbalized understanding of plan.

## 2015-09-09 DIAGNOSIS — I10 Essential (primary) hypertension: Secondary | ICD-10-CM | POA: Diagnosis not present

## 2015-09-09 DIAGNOSIS — I2510 Atherosclerotic heart disease of native coronary artery without angina pectoris: Secondary | ICD-10-CM | POA: Diagnosis not present

## 2015-09-09 DIAGNOSIS — E7800 Pure hypercholesterolemia, unspecified: Secondary | ICD-10-CM | POA: Diagnosis not present

## 2015-09-09 DIAGNOSIS — E119 Type 2 diabetes mellitus without complications: Secondary | ICD-10-CM | POA: Diagnosis not present

## 2015-09-11 ENCOUNTER — Ambulatory Visit (INDEPENDENT_AMBULATORY_CARE_PROVIDER_SITE_OTHER): Payer: Medicare Other | Admitting: *Deleted

## 2015-09-11 DIAGNOSIS — I10 Essential (primary) hypertension: Secondary | ICD-10-CM | POA: Diagnosis not present

## 2015-09-11 DIAGNOSIS — R002 Palpitations: Secondary | ICD-10-CM

## 2015-09-11 NOTE — Progress Notes (Signed)
Per 8/17 phone note pt here for BP check. Pt brought in BP readings since 09/05/15 109/54 111/48 113/56 and says HR would not measure on BP monitor at home. Says she has had several "spells" in the last few days where she feels weak like she was going to pass out with some palpitations lasting just a few minutes at a time. Pt says she doesn't have anyone to take her to ED when she is having these spells. BP today 142/64 HR 57. Pt brought in wrist BP monitor from home which read 157/81 HR 57. Pt main concern is the weakness/palpitations. Pt denies SOB/dizziness/weight gain/swelling/CP.

## 2015-09-11 NOTE — Addendum Note (Signed)
Addended by: Julian Hy T on: 09/11/2015 03:47 PM   Modules accepted: Orders

## 2015-09-11 NOTE — Progress Notes (Signed)
Reviewed. Not entirely clear whether these blood pressure readings are leading to a symptomatic response. Heart rate bradycardic but not unusually so. May be worth considering a seven-day heart monitor to see if she is having any heart rhythm changes that are leading to her symptoms.

## 2015-09-11 NOTE — Progress Notes (Signed)
Pt agreeable to event monitor. Orders placed and enrolled.

## 2015-09-16 DIAGNOSIS — R002 Palpitations: Secondary | ICD-10-CM | POA: Diagnosis not present

## 2015-09-17 ENCOUNTER — Ambulatory Visit (INDEPENDENT_AMBULATORY_CARE_PROVIDER_SITE_OTHER): Payer: Medicare Other

## 2015-09-17 DIAGNOSIS — R002 Palpitations: Secondary | ICD-10-CM | POA: Diagnosis not present

## 2015-09-24 ENCOUNTER — Telehealth: Payer: Self-pay | Admitting: *Deleted

## 2015-09-24 NOTE — Telephone Encounter (Signed)
-----   Message from Massie Maroon, [place] sent at 09/24/2015 10:46 AM EDT -----   ----- Message ----- From: Satira Sark, MD Sent: 09/24/2015   9:56 AM To: Merlene Laughter, LPN  Results reviewed. She does have episodes of significant bradycardia with heart rate down around 40, possible junctional rhythm at that time. One 2 second pause. Since she is on both labetalol and Cardizem CD (prior history of SVT), would suggest stopping Cardizem CD for now. Continue to watch blood pressure and heart rate. Hopefully she will feel better with this. A copy of this test should be forwarded to [facility], MD.

## 2015-09-24 NOTE — Telephone Encounter (Signed)
Pt aware and voiced understanding - updated med list and routed to pcp

## 2015-09-25 ENCOUNTER — Encounter (HOSPITAL_COMMUNITY): Payer: Self-pay | Admitting: Hematology

## 2015-09-25 ENCOUNTER — Encounter (HOSPITAL_COMMUNITY): Payer: Medicare Other

## 2015-09-25 ENCOUNTER — Encounter (HOSPITAL_COMMUNITY): Payer: Medicare Other | Attending: Hematology | Admitting: Hematology

## 2015-09-25 VITALS — BP 142/47 | HR 68 | Temp 98.2°F | Resp 18 | Ht 62.0 in | Wt 219.6 lb

## 2015-09-25 DIAGNOSIS — E782 Mixed hyperlipidemia: Secondary | ICD-10-CM | POA: Diagnosis not present

## 2015-09-25 DIAGNOSIS — E1122 Type 2 diabetes mellitus with diabetic chronic kidney disease: Secondary | ICD-10-CM | POA: Insufficient documentation

## 2015-09-25 DIAGNOSIS — I471 Supraventricular tachycardia: Secondary | ICD-10-CM | POA: Insufficient documentation

## 2015-09-25 DIAGNOSIS — N184 Chronic kidney disease, stage 4 (severe): Secondary | ICD-10-CM | POA: Diagnosis not present

## 2015-09-25 DIAGNOSIS — Z7982 Long term (current) use of aspirin: Secondary | ICD-10-CM | POA: Diagnosis not present

## 2015-09-25 DIAGNOSIS — I130 Hypertensive heart and chronic kidney disease with heart failure and stage 1 through stage 4 chronic kidney disease, or unspecified chronic kidney disease: Secondary | ICD-10-CM | POA: Insufficient documentation

## 2015-09-25 DIAGNOSIS — Z79899 Other long term (current) drug therapy: Secondary | ICD-10-CM | POA: Diagnosis not present

## 2015-09-25 DIAGNOSIS — I509 Heart failure, unspecified: Secondary | ICD-10-CM | POA: Diagnosis not present

## 2015-09-25 DIAGNOSIS — I2510 Atherosclerotic heart disease of native coronary artery without angina pectoris: Secondary | ICD-10-CM | POA: Insufficient documentation

## 2015-09-25 DIAGNOSIS — Z880 Allergy status to penicillin: Secondary | ICD-10-CM | POA: Insufficient documentation

## 2015-09-25 DIAGNOSIS — G4733 Obstructive sleep apnea (adult) (pediatric): Secondary | ICD-10-CM | POA: Insufficient documentation

## 2015-09-25 DIAGNOSIS — D472 Monoclonal gammopathy: Secondary | ICD-10-CM | POA: Insufficient documentation

## 2015-09-25 DIAGNOSIS — F319 Bipolar disorder, unspecified: Secondary | ICD-10-CM | POA: Diagnosis not present

## 2015-09-25 DIAGNOSIS — E669 Obesity, unspecified: Secondary | ICD-10-CM | POA: Diagnosis not present

## 2015-09-25 DIAGNOSIS — Z87891 Personal history of nicotine dependence: Secondary | ICD-10-CM | POA: Insufficient documentation

## 2015-09-25 DIAGNOSIS — Z794 Long term (current) use of insulin: Secondary | ICD-10-CM | POA: Diagnosis not present

## 2015-09-25 DIAGNOSIS — D649 Anemia, unspecified: Secondary | ICD-10-CM

## 2015-09-25 DIAGNOSIS — N2589 Other disorders resulting from impaired renal tubular function: Secondary | ICD-10-CM | POA: Diagnosis not present

## 2015-09-25 LAB — COMPREHENSIVE METABOLIC PANEL
ALK PHOS: 72 U/L (ref 38–126)
ALT: 15 U/L (ref 14–54)
AST: 17 U/L (ref 15–41)
Albumin: 4.1 g/dL (ref 3.5–5.0)
Anion gap: 6 (ref 5–15)
BUN: 46 mg/dL — AB (ref 6–20)
CALCIUM: 9.2 mg/dL (ref 8.9–10.3)
CHLORIDE: 107 mmol/L (ref 101–111)
CO2: 26 mmol/L (ref 22–32)
CREATININE: 1.45 mg/dL — AB (ref 0.44–1.00)
GFR, EST AFRICAN AMERICAN: 43 mL/min — AB (ref >60)
GFR, EST NON AFRICAN AMERICAN: 37 mL/min — AB (ref >60)
Glucose, Bld: 144 mg/dL — ABNORMAL HIGH (ref 65–99)
Potassium: 5.1 mmol/L (ref 3.5–5.1)
Sodium: 139 mmol/L (ref 135–145)
Total Bilirubin: 0.4 mg/dL (ref 0.3–1.2)
Total Protein: 7.2 g/dL (ref 6.5–8.1)

## 2015-09-25 LAB — CBC WITH DIFFERENTIAL/PLATELET
Basophils Absolute: 0 10*3/uL (ref 0.0–0.1)
Basophils Relative: 1 %
Eosinophils Absolute: 0.3 10*3/uL (ref 0.0–0.7)
Eosinophils Relative: 4 %
HEMATOCRIT: 34.9 % — AB (ref 36.0–46.0)
HEMOGLOBIN: 11 g/dL — AB (ref 12.0–15.0)
LYMPHS ABS: 1.9 10*3/uL (ref 0.7–4.0)
LYMPHS PCT: 25 %
MCH: 31.3 pg (ref 26.0–34.0)
MCHC: 31.5 g/dL (ref 30.0–36.0)
MCV: 99.1 fL (ref 78.0–100.0)
MONOS PCT: 4 %
Monocytes Absolute: 0.3 10*3/uL (ref 0.1–1.0)
NEUTROS ABS: 5.1 10*3/uL (ref 1.7–7.7)
NEUTROS PCT: 66 %
Platelets: 177 10*3/uL (ref 150–400)
RBC: 3.52 MIL/uL — AB (ref 3.87–5.11)
RDW: 13.1 % (ref 11.5–15.5)
WBC: 7.7 10*3/uL (ref 4.0–10.5)

## 2015-09-25 LAB — IRON AND TIBC
IRON: 58 ug/dL (ref 28–170)
SATURATION RATIOS: 21 % (ref 10.4–31.8)
TIBC: 281 ug/dL (ref 250–450)
UIBC: 223 ug/dL

## 2015-09-25 LAB — FERRITIN: FERRITIN: 46 ng/mL (ref 11–307)

## 2015-09-25 NOTE — Progress Notes (Signed)
Marland Kitchen    HEMATOLOGY/ONCOLOGY CONSULTATION NOTE  Date of Service: 09/25/2015  Patient Care Team: Monico Blitz, MD as PCP - General (Internal Medicine) Dorann Ou, MD as Consulting Physician (Ophthalmology) Dr Lowanda Foster MD (Nephrology)  CHIEF COMPLAINTS/PURPOSE OF CONSULTATION:  IgG kappa MGUS --continued management  HISTORY OF PRESENTING ILLNESS:   Christy [name] is a wonderful 65 y.o. female who has been referred to [place] by Dr .Monico Blitz, MD  for evaluation and continued management of her IgG kappa MGUS.  Patient has a history of hypertension, dyslipidemia, coronary artery disease status post PCI, obstructive sleep apnea on CPAP, diabetes type 2, CKD stage 4, RTA type IV and bipolar disorder who was noted to have an elevation in her creatinine and was seen by nephrology in November 2014 and had a SPEP done as a part of the workup for her kidney disease and was noted to have an M spike of 0.6 with normal Lambda Light Chain Ratio. Immunofixation showed an IgG kappa monoclonal protein. Patient had hepatitis C antibody which was negative, ANA negative, C ANCA negative, p-ANCA negative, anti-double-stranded DNA antibody negative and complements were within normal limits. Her creatinine at that time was 1.32. Patient reports that increase potassium levels were concerned that initially triggered her nephrology evaluation. Patient had a hemoglobin of 11.8 at the time and no significant protein in the urine. Patient reports she got a bone scan which was noted to be negative. She was not recommended a bone marrow examination based on no other acute concerns regarding multiple myeloma.  Patient was being followed with Dr Tressie Stalker and was last seen in 01/2015 when her M-spike was 0.7 and did not show any significant increase. Recent labs in July 2017 showed a hemoglobin of 10.4 and a urine total protein creatinine ratio 0.2. Creatinine has been gradually increasing over the last couple of years and is now up to 2  with BUN of 60 suggesting some element of dehydration. 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 She has had history of some hyperkalemia thought to be related to type IV RTA from her diabetes and possibly from Ace inhibitors which were discontinued.  Patient notes no new bone pains. Her calcium levels have remained normal. Energy levels have remained stable. Notes that she has lost about 40 pounds intentionally with diet and exercise and appropriate use of diuretics.  MEDICAL HISTORY:  Past Medical History:  Diagnosis Date  . Bipolar disorder ([place])   . Coronary atherosclerosis of native coronary artery    BMS circ 2006, 70% PDA 2009  . Essential hypertension, benign   . Mixed hyperlipidemia   . OSA on CPAP   . Renal tubular acidosis, type IV   . SVT (supraventricular tachycardia) ([place])   . Type 2 diabetes mellitus ([facility])   CHF CKD stage 4 Obesity RTA type IV  SURGICAL HISTORY: Past Surgical History:  Procedure Laterality Date  . Acromioclavicular arthritis    . CARDIAC CATHETERIZATION N/A 03/29/2015   Procedure: Left Heart Cath and Coronary Angiography;  Surgeon: Troy Sine, MD;  Location: [facility];  Service: Cardiovascular;  Laterality: N/A;  . RIght shoulder adhesive capsulitis    . Rotator cuff impingement syndrome    . Rotator cuff tear      SOCIAL HISTORY: Social History   Social History  . Marital status: Divorced    Spouse name: N/A  . Number of children: N/A  .  Years of education: N/A   Occupational History  . PLANNING AND INSPECTOR [employer]   Social History Main Topics  . Smoking status: Former Smoker    Packs/day: 1.00    Years: 20.00    Types: Cigarettes    Start date: 10/30/1984    Quit date: 07/08/2013  . Smokeless tobacco: Never Used  . Alcohol use No  . Drug use: No  . Sexual activity: Not on file   Other Topics Concern  . Not on file   Social  History Narrative   She has lived with some of her daughters. Works part-time for the city.     FAMILY HISTORY: Family History  Problem Relation Age of Onset  . Bipolar disorder Brother   . Diabetes Father   Maternal Aunt with breast cancer.   ALLERGIES:  is allergic to ace inhibitors; lisinopril; penicillins; tetracycline; and lamictal [lamotrigine].  MEDICATIONS:  Current Outpatient Prescriptions  Medication Sig Dispense Refill  . allopurinol (ZYLOPRIM) 300 MG tablet Take 150 mg by mouth daily.    . aspirin 81 MG EC tablet Take 81 mg by mouth daily.      . buPROPion (WELLBUTRIN XL) 300 MG 24 hr tablet Take 300 mg by mouth daily.      . calcium carbonate (OS-CAL) 600 MG TABS Take 600 mg by mouth daily.      . Cholecalciferol (VITAMIN D) 400 UNITS capsule Take 400 Units by mouth daily.      . divalproex (DEPAKOTE) 250 MG EC tablet Take 250 mg by mouth daily.      . insulin aspart (NOVOLOG) 100 UNIT/ML injection Inject into the skin. Sliding scale 5 - 45 units as needed up to four times a day    . Insulin Glargine (TOUJEO SOLOSTAR [place]) Inject 70 Units into the skin See admin instructions. 100 units AM and 90 units PM     . isosorbide mononitrate (IMDUR) 60 MG 24 hr tablet Take 1.5 tablets (90 mg total) by mouth daily. 45 tablet 6  . labetalol (NORMODYNE) 200 MG tablet Take 2 tablets (400 mg total) by mouth 2 (two) times daily. 120 tablet 6  . Liraglutide (VICTOZA) 18 MG/3ML SOLN Inject 1.2 mg into the skin daily.     . losartan (COZAAR) 25 MG tablet Take 25 mg by mouth daily.    . Multiple Vitamin (MULTIVITAMIN) tablet Take 1 tablet by mouth daily.      . nitroGLYCERIN (NITROLINGUAL) 0.4 MG/SPRAY spray Place 1 spray under the tongue every 5 (five) minutes x 3 doses as needed. 12 g 2  . pantoprazole (PROTONIX) 40 MG tablet Take 40 mg by mouth daily.      . POLYETHYLENE GLYCOL 3350 PO Take by mouth daily.    . rosuvastatin (CRESTOR) 10 MG tablet Take 10 mg by mouth daily.    . sertraline  (ZOLOFT) 100 MG tablet Take 100 mg by mouth daily.      . torsemide (DEMADEX) 20 MG tablet Take 2 tablets (40 mg total) by mouth every morning.    . traZODone (DESYREL) 50 MG tablet Take 50 mg by mouth daily.       No current facility-administered medications for this visit.     REVIEW OF SYSTEMS:    10 Point review of Systems was done is negative except as noted above.  PHYSICAL EXAMINATION: ECOG PERFORMANCE STATUS: 1 - Symptomatic but completely ambulatory  . Vitals:   09/25/15 1208  BP: (!) 142/47  Pulse: 68    Resp: 18  Temp: 98.2 F (36.8 C)   Filed Weights   09/25/15 1208  Weight: 219 lb 9 oz (99.6 kg)   .Body mass index is 40.16 kg/m.  GENERAL:alert, in no acute distress and comfortable SKIN: skin color, texture, turgor are normal, no rashes or significant lesions EYES: normal, conjunctiva are pink and non-injected, sclera clear OROPHARYNX:no exudate, no erythema and lips, buccal mucosa, and tongue normal  NECK: supple, no JVD, thyroid normal size, non-tender, without nodularity LYMPH:  no palpable lymphadenopathy in the cervical, axillary or inguinal LUNGS: clear to auscultation with normal respiratory effort HEART: regular rate & rhythm,  no murmurs and trace lower extremity edema ABDOMEN: abdomen soft, non-tender, normoactive bowel sounds  Musculoskeletal: no cyanosis of digits and no clubbing  PSYCH: alert & oriented x 3 with fluent speech NEURO: no focal motor/sensory deficits  LABORATORY DATA:  I have reviewed the data as listed  . CBC Latest Ref Rng & Units 09/25/2015 03/29/2015 12/29/2007  WBC 4.0 - 10.5 K/uL 7.7 8.4 7.8  Hemoglobin 12.0 - 15.0 g/dL 11.0(L) 10.2(L) 10.7(L)  Hematocrit 36.0 - 46.0 % 34.9(L) 32.5(L) 31.5(L)  Platelets 150 - 400 K/uL 177 169 193    . CMP Latest Ref Rng & Units 03/29/2015 02/27/2008 12/29/2007  Glucose 65 - 99 mg/dL 163(H) - 56(L)  BUN 6 - 20 mg/dL 43(H) 34 50(H)  Creatinine 0.44 - 1.00 mg/dL 1.35(H) 1.1 1.37(H)  Sodium  135 - 145 mmol/L 144 - 144 DELTA CHECK NOTED  Potassium 3.5 - 5.1 mmol/L 5.0 - 4.1  Chloride 101 - 111 mmol/L 107 - 104  CO2 22 - 32 mmol/L 24 - 29  Calcium 8.9 - 10.3 mg/dL 9.2 9.6 9.4  Total Protein 6.0 - 8.3 g/dL - - -  Total Bilirubin 0.3 - 1.2 mg/dL - - -  Alkaline Phos 39 - 117 U/L - - -  AST 0 - 37 U/L - - -  ALT 0 - 35 U/L - - -     RADIOGRAPHIC STUDIES: I have personally reviewed the radiological images as listed and agreed with the findings in the report. No results found.  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 Recent outside labs show no hypercalcemia. 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 Has not had significant proteinuria to be concerned about overt AL amyloidosis. No new bone pains  #2 elevated kappa And lambda free light chains with normal ratio. Likely due to poor SFLC clearance in the setting of CKD  #3 Normocytic anemia --likely from CKD. Will check ferritin and Iron profile --- will need to maintain ferritin >100 Plan -explained the current situation to the patient with regards to MGUS. She had several questions which were answered. -We'll get a CBC, CMP, myeloma panel and serum free light chains and labs today -No clinical evidence of progression to multiple myeloma at this time. -Continue follow-up with primary care physician and nephrology. -No indication for bone marrow examination at this time and this is a significant change in her blood picture or SPEP -We'll follow up on her SPEP results from today.  Return to care with Dr Penland in 6 months with rpt labs. Earlier if any other new concerns.  All of the patients questions were answered with   apparent satisfaction. The  patient knows to call the clinic with any problems, questions or concerns.  I spent 45 minutes counseling the patient face to face. The total time spent in the appointment was 50 minutes and more than 50% was on counseling and direct patient cares.      MD MS AAHIVMS [facility] Hematology/Oncology Physician  [facility]  (Office):       [phone] (Work cell):  [phone] (Fax):           [phone]  09/25/2015 12:28 PM     

## 2015-09-25 NOTE — Patient Instructions (Addendum)
[place] at [facility]  Discharge Instructions:  Seen by MD Irene Limbo today.   Labs today.  Labs/Follow up with MD Penland in 6 months _______________________________________________________________  Thank you for choosing [place] at [facility] to provide your oncology and hematology care.  To afford each patient quality time with our providers, please arrive at least 15 minutes before your scheduled appointment.  You need to re-schedule your appointment if you arrive 10 or more minutes late.  We strive to give you quality time with our providers, and arriving late affects you and other patients whose appointments are after yours.  Also, if you no show three or more times for appointments you may be dismissed from the clinic.  Again, thank you for choosing [place] at [place] hope is that these requests will allow you access to exceptional care and in a timely manner. _______________________________________________________________  If you have questions after your visit, please contact our office at [phone] between the hours of 8:30 a.m. and 5:00 p.m. Voicemails left after 4:30 p.m. will not be returned until the following business day. _______________________________________________________________  For prescription refill requests, have your pharmacy contact our office. _______________________________________________________________  Recommendations made by the consultant and any test results will be sent to your referring physician. _______________________________________________________________

## 2015-09-26 LAB — KAPPA/LAMBDA LIGHT CHAINS
Kappa free light chain: 42.9 mg/L — ABNORMAL HIGH (ref 3.3–19.4)
Kappa, lambda light chain ratio: 1.11 (ref 0.26–1.65)
Lambda free light chains: 38.8 mg/L — ABNORMAL HIGH (ref 5.7–26.3)

## 2015-09-27 DIAGNOSIS — E7800 Pure hypercholesterolemia, unspecified: Secondary | ICD-10-CM | POA: Diagnosis not present

## 2015-09-27 DIAGNOSIS — E119 Type 2 diabetes mellitus without complications: Secondary | ICD-10-CM | POA: Diagnosis not present

## 2015-09-27 DIAGNOSIS — I10 Essential (primary) hypertension: Secondary | ICD-10-CM | POA: Diagnosis not present

## 2015-09-27 DIAGNOSIS — I2510 Atherosclerotic heart disease of native coronary artery without angina pectoris: Secondary | ICD-10-CM | POA: Diagnosis not present

## 2015-09-30 LAB — MULTIPLE MYELOMA PANEL, SERUM
ALBUMIN SERPL ELPH-MCNC: 4 g/dL (ref 2.9–4.4)
Albumin/Glob SerPl: 1.4 (ref 0.7–1.7)
Alpha 1: 0.2 g/dL (ref 0.0–0.4)
Alpha2 Glob SerPl Elph-Mcnc: 0.5 g/dL (ref 0.4–1.0)
B-Globulin SerPl Elph-Mcnc: 0.9 g/dL (ref 0.7–1.3)
GLOBULIN, TOTAL: 3 g/dL (ref 2.2–3.9)
Gamma Glob SerPl Elph-Mcnc: 1.3 g/dL (ref 0.4–1.8)
IGA: 131 mg/dL (ref 87–352)
IGG (IMMUNOGLOBIN G), SERUM: 1244 mg/dL (ref 700–1600)
IGM, SERUM: 209 mg/dL (ref 26–217)
TOTAL PROTEIN ELP: 7 g/dL (ref 6.0–8.5)

## 2015-10-08 ENCOUNTER — Encounter: Payer: Self-pay | Admitting: Hematology & Oncology

## 2015-10-11 DIAGNOSIS — Z23 Encounter for immunization: Secondary | ICD-10-CM | POA: Diagnosis not present

## 2015-10-17 DIAGNOSIS — F4310 Post-traumatic stress disorder, unspecified: Secondary | ICD-10-CM | POA: Diagnosis not present

## 2015-10-17 DIAGNOSIS — F411 Generalized anxiety disorder: Secondary | ICD-10-CM | POA: Diagnosis not present

## 2015-10-17 DIAGNOSIS — F3112 Bipolar disorder, current episode manic without psychotic features, moderate: Secondary | ICD-10-CM | POA: Diagnosis not present

## 2015-10-29 DIAGNOSIS — I129 Hypertensive chronic kidney disease with stage 1 through stage 4 chronic kidney disease, or unspecified chronic kidney disease: Secondary | ICD-10-CM | POA: Diagnosis not present

## 2015-10-29 DIAGNOSIS — D509 Iron deficiency anemia, unspecified: Secondary | ICD-10-CM | POA: Diagnosis not present

## 2015-10-29 DIAGNOSIS — N183 Chronic kidney disease, stage 3 (moderate): Secondary | ICD-10-CM | POA: Diagnosis not present

## 2015-10-29 DIAGNOSIS — E559 Vitamin D deficiency, unspecified: Secondary | ICD-10-CM | POA: Diagnosis not present

## 2015-10-29 DIAGNOSIS — Z79899 Other long term (current) drug therapy: Secondary | ICD-10-CM | POA: Diagnosis not present

## 2015-10-29 DIAGNOSIS — R809 Proteinuria, unspecified: Secondary | ICD-10-CM | POA: Diagnosis not present

## 2015-11-05 DIAGNOSIS — I509 Heart failure, unspecified: Secondary | ICD-10-CM | POA: Diagnosis not present

## 2015-11-05 DIAGNOSIS — E1129 Type 2 diabetes mellitus with other diabetic kidney complication: Secondary | ICD-10-CM | POA: Diagnosis not present

## 2015-11-05 DIAGNOSIS — N183 Chronic kidney disease, stage 3 (moderate): Secondary | ICD-10-CM | POA: Diagnosis not present

## 2015-11-05 DIAGNOSIS — R809 Proteinuria, unspecified: Secondary | ICD-10-CM | POA: Diagnosis not present

## 2015-11-05 DIAGNOSIS — E875 Hyperkalemia: Secondary | ICD-10-CM | POA: Diagnosis not present

## 2015-11-05 DIAGNOSIS — G4733 Obstructive sleep apnea (adult) (pediatric): Secondary | ICD-10-CM | POA: Diagnosis not present

## 2015-11-08 ENCOUNTER — Other Ambulatory Visit: Payer: Self-pay | Admitting: *Deleted

## 2015-11-08 DIAGNOSIS — I10 Essential (primary) hypertension: Secondary | ICD-10-CM

## 2015-11-13 DIAGNOSIS — E119 Type 2 diabetes mellitus without complications: Secondary | ICD-10-CM | POA: Diagnosis not present

## 2015-11-13 DIAGNOSIS — E7800 Pure hypercholesterolemia, unspecified: Secondary | ICD-10-CM | POA: Diagnosis not present

## 2015-11-13 DIAGNOSIS — I10 Essential (primary) hypertension: Secondary | ICD-10-CM | POA: Diagnosis not present

## 2015-11-13 DIAGNOSIS — I2510 Atherosclerotic heart disease of native coronary artery without angina pectoris: Secondary | ICD-10-CM | POA: Diagnosis not present

## 2015-11-14 DIAGNOSIS — E1140 Type 2 diabetes mellitus with diabetic neuropathy, unspecified: Secondary | ICD-10-CM | POA: Diagnosis not present

## 2015-11-14 DIAGNOSIS — L110 Acquired keratosis follicularis: Secondary | ICD-10-CM | POA: Diagnosis not present

## 2015-11-14 DIAGNOSIS — L609 Nail disorder, unspecified: Secondary | ICD-10-CM | POA: Diagnosis not present

## 2015-11-19 DIAGNOSIS — I10 Essential (primary) hypertension: Secondary | ICD-10-CM | POA: Diagnosis not present

## 2015-11-20 ENCOUNTER — Telehealth: Payer: Self-pay | Admitting: *Deleted

## 2015-11-20 NOTE — Telephone Encounter (Signed)
Patient informed. Copy sent to PCP °

## 2015-11-20 NOTE — Telephone Encounter (Signed)
-----   Message from Satira Sark, MD sent at 11/20/2015  3:01 PM EDT ----- Results reviewed. Creatinine is stable at 1.5 (previously 1.6) on current regimen. Potassium normal. A copy of this test should be forwarded to [facility], MD.

## 2015-11-25 NOTE — Progress Notes (Signed)
Cardiology Office Note  Date: 11/27/2015   ID: ASYA DERRYBERRY, DOB 1950-07-04, MRN [id number]  PCP: Monico Blitz, MD  Primary Cardiologist: Rozann Lesches, MD   Chief Complaint  Patient presents with  . Diastolic heart failure    History of Present Illness: Christy [name] is a 65 y.o. female last seen in May. She presents for a follow-up visit today. As documented in telephone notes, patient has had episodic "spells" where she feels very weak and presyncopal, some palpitations. Blood pressure has been low normal at these times, but not frankly hypotensive. She did have a blood pressure check in the office and was subsequently set up to wear a cardiac monitor.  Cardiac monitor demonstrated sinus rhythm with some significant episodes of bradycardia down to around 40, possible junctional rhythm, and a 2 second pause. She was taken off of Cardizem CD.  She comes in today stating that she feels much better. Current regimen includes labetalol and Cozaar. I went over her home blood pressure and heart rate checks. She has had only a few very brief dizzy spells. Weight is stable otherwise, no leg swelling, orthopnea, or PND.  Follow-up lab work is outlined below.  Past Medical History:  Diagnosis Date  . Bipolar disorder ([place])   . Coronary atherosclerosis of native coronary artery    BMS circ 2006, 70% PDA 2009  . Essential hypertension, benign   . Mixed hyperlipidemia   . OSA on CPAP   . Renal tubular acidosis, type IV   . SVT (supraventricular tachycardia) ([place])   . Type 2 diabetes mellitus ([place])     Past Surgical History:  Procedure Laterality Date  . Acromioclavicular arthritis    . CARDIAC CATHETERIZATION N/A 03/29/2015   Procedure: Left Heart Cath and Coronary Angiography;  Surgeon: Troy Sine, MD;  Location: [facility];  Service: Cardiovascular;  Laterality: N/A;  . RIght shoulder adhesive capsulitis    . Rotator cuff impingement syndrome    . Rotator cuff tear       Current Outpatient Prescriptions  Medication Sig Dispense Refill  . allopurinol (ZYLOPRIM) 300 MG tablet Take 150 mg by mouth daily.    Marland Kitchen aspirin 81 MG EC tablet Take 81 mg by mouth daily.      Marland Kitchen buPROPion (WELLBUTRIN XL) 300 MG 24 hr tablet Take 300 mg by mouth daily.      . calcium carbonate (OS-CAL) 600 MG TABS Take 600 mg by mouth daily.      . Cholecalciferol (VITAMIN D) 400 UNITS capsule Take 400 Units by mouth daily.      . divalproex (DEPAKOTE) 250 MG EC tablet Take 250 mg by mouth daily.      . insulin aspart (NOVOLOG) 100 UNIT/ML injection Inject into the skin. Sliding scale 5 - 45 units as needed up to four times a day    . Insulin Glargine (TOUJEO SOLOSTAR [place]) Inject 70 Units into the skin See admin instructions. 100 units AM and 90 units PM     . isosorbide mononitrate (IMDUR) 60 MG 24 hr tablet Take 1.5 tablets (90 mg total) by mouth daily. 45 tablet 6  . labetalol (NORMODYNE) 200 MG tablet Take 2 tablets (400 mg total) by mouth 2 (two) times daily. 120 tablet 6  . Liraglutide (VICTOZA) 18 MG/3ML SOLN Inject 1.2 mg into the skin daily.     Marland Kitchen losartan (COZAAR) 25 MG tablet Take 25 mg by mouth daily.    . Multiple Vitamin (MULTIVITAMIN)  tablet Take 1 tablet by mouth daily.      . nitroGLYCERIN (NITROLINGUAL) 0.4 MG/SPRAY spray Place 1 spray under the tongue every 5 (five) minutes x 3 doses as needed. 12 g 2  . pantoprazole (PROTONIX) 40 MG tablet Take 40 mg by mouth daily.      Marland Kitchen POLYETHYLENE GLYCOL 3350 PO Take by mouth daily.    . rosuvastatin (CRESTOR) 10 MG tablet Take 10 mg by mouth daily.    . sertraline (ZOLOFT) 100 MG tablet Take 100 mg by mouth daily.      Marland Kitchen torsemide (DEMADEX) 20 MG tablet Take 2 tablets (40 mg total) by mouth every morning.    . traZODone (DESYREL) 50 MG tablet Take 50 mg by mouth daily.       No current facility-administered medications for this visit.    Allergies:  Ace inhibitors; Lisinopril; Penicillins; Tetracycline; and Lamictal  [lamotrigine]   Social History: The patient  reports that she quit smoking about 2 years ago. Her smoking use included Cigarettes. She started smoking about 31 years ago. She has a 20.00 pack-year smoking history. She has never used smokeless tobacco. She reports that she does not drink alcohol or use drugs.   ROS:  Please see the history of present illness. Otherwise, complete review of systems is positive for none.  All other systems are reviewed and negative.   Physical Exam: VS:  BP 110/64   Pulse (!) 59   Ht 5\' 3"  (1.6 m)   Wt 219 lb (99.3 kg)   SpO2 99%   BMI 38.79 kg/m , BMI Body mass index is 38.79 kg/m.  Wt Readings from Last 3 Encounters:  11/27/15 219 lb (99.3 kg)  09/25/15 219 lb 9 oz (99.6 kg)  06/03/15 236 lb (107 kg)    Obese woman, no distress.  HEENT: Conjunctiva and lids normal, oropharynx clear.  Neck: Supple, no elevated JVP or carotid bruits, no thyromegaly.  Lungs: Clear to auscultation, nonlabored breathing at rest.  Cardiac: Regular rate and rhythm, no S3, 2/6 systolic murmur, no pericardial rub.  Abdomen: Soft, nontender, bowel sounds present, no guarding or rebound.  Extremities: Ankle edema, distal pulses 2+. Skin: Warm and dry. Musculoskeletal: No kyphosis. Neuropsychiatric: Alert and oriented 3, affect appropriate.  ECG: I personally reviewed the tracing from 03/07/2015 which showed sinus bradycardia with prolonged PR interval and IVCD.  Recent Labwork: 09/25/2015: ALT 15; AST 17; BUN 46; Creatinine, Ser 1.45; Hemoglobin 11.0; Platelets 177; Potassium 5.1; Sodium 139   Other Studies Reviewed Today:  Echocardiogram 03/13/2015: Study Conclusions  - Left ventricle: The cavity size was normal. Wall thickness was   increased in a pattern of mild LVH. Systolic function was normal.   The estimated ejection fraction was in the range of 60% to 65%.   Wall motion was normal; there were no regional wall motion   abnormalities. Doppler parameters are  consistent with restrictive   physiology, indicative of decreased left ventricular diastolic   compliance and/or increased left atrial pressure. - Aortic valve: There was mild regurgitation. Valve area (VTI):   2.84 cm^2. Valve area (Vmax): 2.77 cm^2. Valve area (Vmean): 2.57   cm^2. - Mitral valve: Mildly calcified annulus. Mildly thickened leaflets   . There was mild regurgitation. - Left atrium: The atrium was severely dilated. - Right atrium: The atrium was mildly dilated. - Pulmonary arteries: Systolic pressure was mildly increased. PA   peak pressure: 37 mm Hg (S). - Technically difficult study.  Assessment and Plan:  1. Symptomatic bradycardia, improved after discontinuing Cardizem CD. We will continue with her current regimen. Labetalol dose could be further reduced if needed.  2. CAD status post BMS to circumflex in 2006. Recent cardiac catheterization in March of this year showed patent stent site with no other major obstructive stenoses. No active angina symptoms on medical therapy. She is on aspirin and statin.  3. Essential hypertension, continue Cozaar and labetalol at current doses.  4. Chronic diastolic heart failure, stable on current diuretic regimen.  Current medicines were reviewed with the patient today.   Disposition: Follow-up in 6 months.  Signed, Satira Sark, MD, [facility] 11/27/2015 11:00 AM    [facility] at [place], [place], [place] [address] Phone: [phone]; Fax: [phone]

## 2015-11-27 ENCOUNTER — Encounter: Payer: Self-pay | Admitting: Cardiology

## 2015-11-27 ENCOUNTER — Ambulatory Visit (INDEPENDENT_AMBULATORY_CARE_PROVIDER_SITE_OTHER): Payer: Medicare Other | Admitting: Cardiology

## 2015-11-27 VITALS — BP 110/64 | HR 59 | Ht 63.0 in | Wt 219.0 lb

## 2015-11-27 DIAGNOSIS — I5032 Chronic diastolic (congestive) heart failure: Secondary | ICD-10-CM | POA: Diagnosis not present

## 2015-11-27 DIAGNOSIS — I2510 Atherosclerotic heart disease of native coronary artery without angina pectoris: Secondary | ICD-10-CM

## 2015-11-27 DIAGNOSIS — I10 Essential (primary) hypertension: Secondary | ICD-10-CM | POA: Diagnosis not present

## 2015-11-27 DIAGNOSIS — R001 Bradycardia, unspecified: Secondary | ICD-10-CM | POA: Diagnosis not present

## 2015-11-27 NOTE — Patient Instructions (Signed)

## 2015-12-05 DIAGNOSIS — I2510 Atherosclerotic heart disease of native coronary artery without angina pectoris: Secondary | ICD-10-CM | POA: Diagnosis not present

## 2015-12-05 DIAGNOSIS — E7800 Pure hypercholesterolemia, unspecified: Secondary | ICD-10-CM | POA: Diagnosis not present

## 2015-12-05 DIAGNOSIS — I10 Essential (primary) hypertension: Secondary | ICD-10-CM | POA: Diagnosis not present

## 2015-12-05 DIAGNOSIS — E119 Type 2 diabetes mellitus without complications: Secondary | ICD-10-CM | POA: Diagnosis not present

## 2015-12-17 DIAGNOSIS — Z6841 Body Mass Index (BMI) 40.0 and over, adult: Secondary | ICD-10-CM | POA: Diagnosis not present

## 2015-12-17 DIAGNOSIS — F319 Bipolar disorder, unspecified: Secondary | ICD-10-CM | POA: Diagnosis not present

## 2015-12-17 DIAGNOSIS — E1142 Type 2 diabetes mellitus with diabetic polyneuropathy: Secondary | ICD-10-CM | POA: Diagnosis not present

## 2015-12-17 DIAGNOSIS — I509 Heart failure, unspecified: Secondary | ICD-10-CM | POA: Diagnosis not present

## 2015-12-17 DIAGNOSIS — Z299 Encounter for prophylactic measures, unspecified: Secondary | ICD-10-CM | POA: Diagnosis not present

## 2015-12-23 DIAGNOSIS — I10 Essential (primary) hypertension: Secondary | ICD-10-CM | POA: Diagnosis not present

## 2015-12-23 DIAGNOSIS — I2510 Atherosclerotic heart disease of native coronary artery without angina pectoris: Secondary | ICD-10-CM | POA: Diagnosis not present

## 2015-12-23 DIAGNOSIS — E7800 Pure hypercholesterolemia, unspecified: Secondary | ICD-10-CM | POA: Diagnosis not present

## 2015-12-23 DIAGNOSIS — E119 Type 2 diabetes mellitus without complications: Secondary | ICD-10-CM | POA: Diagnosis not present

## 2015-12-25 DIAGNOSIS — R928 Other abnormal and inconclusive findings on diagnostic imaging of breast: Secondary | ICD-10-CM | POA: Diagnosis not present

## 2015-12-25 DIAGNOSIS — N6489 Other specified disorders of breast: Secondary | ICD-10-CM | POA: Diagnosis not present

## 2016-01-30 DIAGNOSIS — E1140 Type 2 diabetes mellitus with diabetic neuropathy, unspecified: Secondary | ICD-10-CM | POA: Diagnosis not present

## 2016-01-30 DIAGNOSIS — L609 Nail disorder, unspecified: Secondary | ICD-10-CM | POA: Diagnosis not present

## 2016-01-30 DIAGNOSIS — L110 Acquired keratosis follicularis: Secondary | ICD-10-CM | POA: Diagnosis not present

## 2016-02-05 DIAGNOSIS — E7800 Pure hypercholesterolemia, unspecified: Secondary | ICD-10-CM | POA: Diagnosis not present

## 2016-02-05 DIAGNOSIS — I10 Essential (primary) hypertension: Secondary | ICD-10-CM | POA: Diagnosis not present

## 2016-02-05 DIAGNOSIS — E119 Type 2 diabetes mellitus without complications: Secondary | ICD-10-CM | POA: Diagnosis not present

## 2016-02-05 DIAGNOSIS — I2510 Atherosclerotic heart disease of native coronary artery without angina pectoris: Secondary | ICD-10-CM | POA: Diagnosis not present

## 2016-02-12 DIAGNOSIS — F411 Generalized anxiety disorder: Secondary | ICD-10-CM | POA: Diagnosis not present

## 2016-02-12 DIAGNOSIS — F4310 Post-traumatic stress disorder, unspecified: Secondary | ICD-10-CM | POA: Diagnosis not present

## 2016-02-12 DIAGNOSIS — F3112 Bipolar disorder, current episode manic without psychotic features, moderate: Secondary | ICD-10-CM | POA: Diagnosis not present

## 2016-02-13 DIAGNOSIS — E11319 Type 2 diabetes mellitus with unspecified diabetic retinopathy without macular edema: Secondary | ICD-10-CM | POA: Diagnosis not present

## 2016-02-28 DIAGNOSIS — E119 Type 2 diabetes mellitus without complications: Secondary | ICD-10-CM | POA: Diagnosis not present

## 2016-02-28 DIAGNOSIS — I2510 Atherosclerotic heart disease of native coronary artery without angina pectoris: Secondary | ICD-10-CM | POA: Diagnosis not present

## 2016-02-28 DIAGNOSIS — E7800 Pure hypercholesterolemia, unspecified: Secondary | ICD-10-CM | POA: Diagnosis not present

## 2016-02-28 DIAGNOSIS — I10 Essential (primary) hypertension: Secondary | ICD-10-CM | POA: Diagnosis not present

## 2016-03-03 DIAGNOSIS — R809 Proteinuria, unspecified: Secondary | ICD-10-CM | POA: Diagnosis not present

## 2016-03-03 DIAGNOSIS — Z79899 Other long term (current) drug therapy: Secondary | ICD-10-CM | POA: Diagnosis not present

## 2016-03-03 DIAGNOSIS — E559 Vitamin D deficiency, unspecified: Secondary | ICD-10-CM | POA: Diagnosis not present

## 2016-03-03 DIAGNOSIS — I129 Hypertensive chronic kidney disease with stage 1 through stage 4 chronic kidney disease, or unspecified chronic kidney disease: Secondary | ICD-10-CM | POA: Diagnosis not present

## 2016-03-03 DIAGNOSIS — D509 Iron deficiency anemia, unspecified: Secondary | ICD-10-CM | POA: Diagnosis not present

## 2016-03-03 DIAGNOSIS — N183 Chronic kidney disease, stage 3 (moderate): Secondary | ICD-10-CM | POA: Diagnosis not present

## 2016-03-10 DIAGNOSIS — E875 Hyperkalemia: Secondary | ICD-10-CM | POA: Diagnosis not present

## 2016-03-10 DIAGNOSIS — I10 Essential (primary) hypertension: Secondary | ICD-10-CM | POA: Diagnosis not present

## 2016-03-10 DIAGNOSIS — N183 Chronic kidney disease, stage 3 (moderate): Secondary | ICD-10-CM | POA: Diagnosis not present

## 2016-03-18 ENCOUNTER — Ambulatory Visit (INDEPENDENT_AMBULATORY_CARE_PROVIDER_SITE_OTHER): Payer: Medicare Other | Admitting: Ophthalmology

## 2016-03-18 DIAGNOSIS — H35033 Hypertensive retinopathy, bilateral: Secondary | ICD-10-CM

## 2016-03-18 DIAGNOSIS — E113312 Type 2 diabetes mellitus with moderate nonproliferative diabetic retinopathy with macular edema, left eye: Secondary | ICD-10-CM

## 2016-03-18 DIAGNOSIS — H43813 Vitreous degeneration, bilateral: Secondary | ICD-10-CM | POA: Diagnosis not present

## 2016-03-18 DIAGNOSIS — I10 Essential (primary) hypertension: Secondary | ICD-10-CM

## 2016-03-18 DIAGNOSIS — E11311 Type 2 diabetes mellitus with unspecified diabetic retinopathy with macular edema: Secondary | ICD-10-CM | POA: Diagnosis not present

## 2016-03-18 DIAGNOSIS — E113391 Type 2 diabetes mellitus with moderate nonproliferative diabetic retinopathy without macular edema, right eye: Secondary | ICD-10-CM | POA: Diagnosis not present

## 2016-03-23 DIAGNOSIS — Z299 Encounter for prophylactic measures, unspecified: Secondary | ICD-10-CM | POA: Diagnosis not present

## 2016-03-23 DIAGNOSIS — Z1211 Encounter for screening for malignant neoplasm of colon: Secondary | ICD-10-CM | POA: Diagnosis not present

## 2016-03-23 DIAGNOSIS — I4891 Unspecified atrial fibrillation: Secondary | ICD-10-CM | POA: Diagnosis not present

## 2016-03-23 DIAGNOSIS — Z1389 Encounter for screening for other disorder: Secondary | ICD-10-CM | POA: Diagnosis not present

## 2016-03-23 DIAGNOSIS — E559 Vitamin D deficiency, unspecified: Secondary | ICD-10-CM | POA: Diagnosis not present

## 2016-03-23 DIAGNOSIS — N183 Chronic kidney disease, stage 3 (moderate): Secondary | ICD-10-CM | POA: Diagnosis not present

## 2016-03-23 DIAGNOSIS — E1365 Other specified diabetes mellitus with hyperglycemia: Secondary | ICD-10-CM | POA: Diagnosis not present

## 2016-03-23 DIAGNOSIS — Z6841 Body Mass Index (BMI) 40.0 and over, adult: Secondary | ICD-10-CM | POA: Diagnosis not present

## 2016-03-23 DIAGNOSIS — F319 Bipolar disorder, unspecified: Secondary | ICD-10-CM | POA: Diagnosis not present

## 2016-03-23 DIAGNOSIS — Z7189 Other specified counseling: Secondary | ICD-10-CM | POA: Diagnosis not present

## 2016-03-23 DIAGNOSIS — R5383 Other fatigue: Secondary | ICD-10-CM | POA: Diagnosis not present

## 2016-03-23 DIAGNOSIS — Z0000 Encounter for general adult medical examination without abnormal findings: Secondary | ICD-10-CM | POA: Diagnosis not present

## 2016-03-23 DIAGNOSIS — E1142 Type 2 diabetes mellitus with diabetic polyneuropathy: Secondary | ICD-10-CM | POA: Diagnosis not present

## 2016-03-23 DIAGNOSIS — E1322 Other specified diabetes mellitus with diabetic chronic kidney disease: Secondary | ICD-10-CM | POA: Diagnosis not present

## 2016-03-23 DIAGNOSIS — E7800 Pure hypercholesterolemia, unspecified: Secondary | ICD-10-CM | POA: Diagnosis not present

## 2016-03-23 DIAGNOSIS — Z79899 Other long term (current) drug therapy: Secondary | ICD-10-CM | POA: Diagnosis not present

## 2016-03-26 ENCOUNTER — Other Ambulatory Visit (HOSPITAL_COMMUNITY): Payer: Medicare Other

## 2016-03-26 ENCOUNTER — Ambulatory Visit (HOSPITAL_COMMUNITY): Payer: Medicare Other | Admitting: Adult Health

## 2016-03-27 DIAGNOSIS — E875 Hyperkalemia: Secondary | ICD-10-CM | POA: Diagnosis not present

## 2016-03-30 ENCOUNTER — Ambulatory Visit (HOSPITAL_COMMUNITY): Payer: Medicare Other

## 2016-03-30 ENCOUNTER — Other Ambulatory Visit (HOSPITAL_COMMUNITY): Payer: Medicare Other

## 2016-04-01 DIAGNOSIS — E119 Type 2 diabetes mellitus without complications: Secondary | ICD-10-CM | POA: Diagnosis not present

## 2016-04-01 DIAGNOSIS — I2510 Atherosclerotic heart disease of native coronary artery without angina pectoris: Secondary | ICD-10-CM | POA: Diagnosis not present

## 2016-04-01 DIAGNOSIS — E7800 Pure hypercholesterolemia, unspecified: Secondary | ICD-10-CM | POA: Diagnosis not present

## 2016-04-01 DIAGNOSIS — I10 Essential (primary) hypertension: Secondary | ICD-10-CM | POA: Diagnosis not present

## 2016-04-02 ENCOUNTER — Encounter (INDEPENDENT_AMBULATORY_CARE_PROVIDER_SITE_OTHER): Payer: Medicare Other | Admitting: Ophthalmology

## 2016-04-02 DIAGNOSIS — E11311 Type 2 diabetes mellitus with unspecified diabetic retinopathy with macular edema: Secondary | ICD-10-CM

## 2016-04-02 DIAGNOSIS — E113312 Type 2 diabetes mellitus with moderate nonproliferative diabetic retinopathy with macular edema, left eye: Secondary | ICD-10-CM | POA: Diagnosis not present

## 2016-04-07 DIAGNOSIS — E2839 Other primary ovarian failure: Secondary | ICD-10-CM | POA: Diagnosis not present

## 2016-04-16 DIAGNOSIS — L609 Nail disorder, unspecified: Secondary | ICD-10-CM | POA: Diagnosis not present

## 2016-04-16 DIAGNOSIS — E1140 Type 2 diabetes mellitus with diabetic neuropathy, unspecified: Secondary | ICD-10-CM | POA: Diagnosis not present

## 2016-04-16 DIAGNOSIS — L110 Acquired keratosis follicularis: Secondary | ICD-10-CM | POA: Diagnosis not present

## 2016-04-24 DIAGNOSIS — M9903 Segmental and somatic dysfunction of lumbar region: Secondary | ICD-10-CM | POA: Diagnosis not present

## 2016-04-24 DIAGNOSIS — S338XXA Sprain of other parts of lumbar spine and pelvis, initial encounter: Secondary | ICD-10-CM | POA: Diagnosis not present

## 2016-04-24 DIAGNOSIS — M47816 Spondylosis without myelopathy or radiculopathy, lumbar region: Secondary | ICD-10-CM | POA: Diagnosis not present

## 2016-04-24 DIAGNOSIS — M5442 Lumbago with sciatica, left side: Secondary | ICD-10-CM | POA: Diagnosis not present

## 2016-04-24 DIAGNOSIS — S76002A Unspecified injury of muscle, fascia and tendon of left hip, initial encounter: Secondary | ICD-10-CM | POA: Diagnosis not present

## 2016-04-27 DIAGNOSIS — M9903 Segmental and somatic dysfunction of lumbar region: Secondary | ICD-10-CM | POA: Diagnosis not present

## 2016-04-27 DIAGNOSIS — S76002A Unspecified injury of muscle, fascia and tendon of left hip, initial encounter: Secondary | ICD-10-CM | POA: Diagnosis not present

## 2016-04-27 DIAGNOSIS — M5442 Lumbago with sciatica, left side: Secondary | ICD-10-CM | POA: Diagnosis not present

## 2016-04-27 DIAGNOSIS — M47816 Spondylosis without myelopathy or radiculopathy, lumbar region: Secondary | ICD-10-CM | POA: Diagnosis not present

## 2016-04-27 DIAGNOSIS — S338XXA Sprain of other parts of lumbar spine and pelvis, initial encounter: Secondary | ICD-10-CM | POA: Diagnosis not present

## 2016-04-29 DIAGNOSIS — M47816 Spondylosis without myelopathy or radiculopathy, lumbar region: Secondary | ICD-10-CM | POA: Diagnosis not present

## 2016-04-29 DIAGNOSIS — S338XXA Sprain of other parts of lumbar spine and pelvis, initial encounter: Secondary | ICD-10-CM | POA: Diagnosis not present

## 2016-04-29 DIAGNOSIS — M9903 Segmental and somatic dysfunction of lumbar region: Secondary | ICD-10-CM | POA: Diagnosis not present

## 2016-04-29 DIAGNOSIS — S76002A Unspecified injury of muscle, fascia and tendon of left hip, initial encounter: Secondary | ICD-10-CM | POA: Diagnosis not present

## 2016-04-29 DIAGNOSIS — M5442 Lumbago with sciatica, left side: Secondary | ICD-10-CM | POA: Diagnosis not present

## 2016-04-30 ENCOUNTER — Encounter (INDEPENDENT_AMBULATORY_CARE_PROVIDER_SITE_OTHER): Payer: Medicare Other | Admitting: Ophthalmology

## 2016-04-30 DIAGNOSIS — I10 Essential (primary) hypertension: Secondary | ICD-10-CM

## 2016-04-30 DIAGNOSIS — H43813 Vitreous degeneration, bilateral: Secondary | ICD-10-CM

## 2016-04-30 DIAGNOSIS — E113391 Type 2 diabetes mellitus with moderate nonproliferative diabetic retinopathy without macular edema, right eye: Secondary | ICD-10-CM | POA: Diagnosis not present

## 2016-04-30 DIAGNOSIS — H35033 Hypertensive retinopathy, bilateral: Secondary | ICD-10-CM | POA: Diagnosis not present

## 2016-04-30 DIAGNOSIS — E11311 Type 2 diabetes mellitus with unspecified diabetic retinopathy with macular edema: Secondary | ICD-10-CM

## 2016-04-30 DIAGNOSIS — E113312 Type 2 diabetes mellitus with moderate nonproliferative diabetic retinopathy with macular edema, left eye: Secondary | ICD-10-CM

## 2016-05-05 DIAGNOSIS — M9903 Segmental and somatic dysfunction of lumbar region: Secondary | ICD-10-CM | POA: Diagnosis not present

## 2016-05-05 DIAGNOSIS — M47816 Spondylosis without myelopathy or radiculopathy, lumbar region: Secondary | ICD-10-CM | POA: Diagnosis not present

## 2016-05-05 DIAGNOSIS — S76002A Unspecified injury of muscle, fascia and tendon of left hip, initial encounter: Secondary | ICD-10-CM | POA: Diagnosis not present

## 2016-05-05 DIAGNOSIS — S338XXA Sprain of other parts of lumbar spine and pelvis, initial encounter: Secondary | ICD-10-CM | POA: Diagnosis not present

## 2016-05-05 DIAGNOSIS — E119 Type 2 diabetes mellitus without complications: Secondary | ICD-10-CM | POA: Diagnosis not present

## 2016-05-05 DIAGNOSIS — I2510 Atherosclerotic heart disease of native coronary artery without angina pectoris: Secondary | ICD-10-CM | POA: Diagnosis not present

## 2016-05-05 DIAGNOSIS — E7800 Pure hypercholesterolemia, unspecified: Secondary | ICD-10-CM | POA: Diagnosis not present

## 2016-05-05 DIAGNOSIS — M5442 Lumbago with sciatica, left side: Secondary | ICD-10-CM | POA: Diagnosis not present

## 2016-05-05 DIAGNOSIS — I10 Essential (primary) hypertension: Secondary | ICD-10-CM | POA: Diagnosis not present

## 2016-05-06 ENCOUNTER — Other Ambulatory Visit (HOSPITAL_COMMUNITY): Payer: Medicare Other

## 2016-05-07 ENCOUNTER — Encounter (HOSPITAL_BASED_OUTPATIENT_CLINIC_OR_DEPARTMENT_OTHER): Payer: Medicare Other | Admitting: Oncology

## 2016-05-07 ENCOUNTER — Encounter (HOSPITAL_COMMUNITY): Payer: Medicare Other | Attending: Hematology

## 2016-05-07 ENCOUNTER — Encounter (HOSPITAL_COMMUNITY): Payer: Self-pay

## 2016-05-07 VITALS — BP 137/48 | HR 55 | Temp 98.3°F | Resp 18 | Wt 213.6 lb

## 2016-05-07 DIAGNOSIS — N184 Chronic kidney disease, stage 4 (severe): Secondary | ICD-10-CM | POA: Diagnosis not present

## 2016-05-07 DIAGNOSIS — D649 Anemia, unspecified: Secondary | ICD-10-CM

## 2016-05-07 DIAGNOSIS — D472 Monoclonal gammopathy: Secondary | ICD-10-CM

## 2016-05-07 LAB — COMPREHENSIVE METABOLIC PANEL
ALBUMIN: 3.8 g/dL (ref 3.5–5.0)
ALT: 23 U/L (ref 14–54)
ANION GAP: 6 (ref 5–15)
AST: 23 U/L (ref 15–41)
Alkaline Phosphatase: 74 U/L (ref 38–126)
BUN: 45 mg/dL — ABNORMAL HIGH (ref 6–20)
CO2: 26 mmol/L (ref 22–32)
Calcium: 8.7 mg/dL — ABNORMAL LOW (ref 8.9–10.3)
Chloride: 105 mmol/L (ref 101–111)
Creatinine, Ser: 1.47 mg/dL — ABNORMAL HIGH (ref 0.44–1.00)
GFR calc Af Amer: 42 mL/min — ABNORMAL LOW (ref >60)
GFR calc non Af Amer: 36 mL/min — ABNORMAL LOW (ref >60)
GLUCOSE: 210 mg/dL — AB (ref 65–99)
POTASSIUM: 5.1 mmol/L (ref 3.5–5.1)
SODIUM: 137 mmol/L (ref 135–145)
Total Bilirubin: 0.4 mg/dL (ref 0.3–1.2)
Total Protein: 6.9 g/dL (ref 6.5–8.1)

## 2016-05-07 NOTE — Patient Instructions (Signed)
[facility] at [facility] Discharge Instructions  RECOMMENDATIONS MADE BY THE CONSULTANT AND ANY TEST RESULTS WILL BE SENT TO YOUR REFERRING PHYSICIAN.  You were seen today by Dr. Twana First Follow up in 6 months with lab work one week before See Amy up front for appointments  Thank you for choosing [facility] at [facility] to provide your oncology and hematology care.  To afford each patient quality time with our provider, please arrive at least 15 minutes before your scheduled appointment time.    If you have a lab appointment with the [facility] please come in thru the  Main Entrance and check in at the main information desk  You need to re-schedule your appointment should you arrive 10 or more minutes late.  We strive to give you quality time with our providers, and arriving late affects you and other patients whose appointments are after yours.  Also, if you no show three or more times for appointments you may be dismissed from the clinic at the providers discretion.     Again, thank you for choosing [facility].  Our hope is that these requests will decrease the amount of time that you wait before being seen by our physicians.       _____________________________________________________________  Should you have questions after your visit to [facility], please contact our office at [phone] between the hours of 8:30 a.m. and 4:30 p.m.  Voicemails left after 4:30 p.m. will not be returned until the following business day.  For prescription refill requests, have your pharmacy contact our office.       Resources For Cancer Patients and their Caregivers ? American Cancer Society: Can assist with transportation, wigs, general needs, runs Look Good Feel Better.        ([phone] [phone] ? Cancer Care: Provides financial assistance, online support groups, medication/co-pay assistance.  1-800-813-HOPE  978 015 5833) ? [facility] Assists [place] cancer patients and their families through emotional , educational and financial support.  (408)150-4768 ? [place] DSS Where to apply for food stamps, Medicaid and utility assistance. 518-485-6462 ? [facility]: Transportation to medical appointments. 4257154616 ? Social Security Administration: May apply for disability if have a Stage IV cancer. (604)732-6070 9491216724 ? [employer], Disability and Transit Services: Assists with nutrition, care and transit needs. [place] Support Programs: @10RELATIVEDAYS @ > Cancer Support Group  2nd Tuesday of the month 1pm-2pm, Journey Room  > Creative Journey  3rd Tuesday of the month 1130am-1pm, Journey Room  > Look Good Feel Better  1st Wednesday of the month 10am-12 noon, Journey Room (Call [facility] to register [phone])

## 2016-05-07 NOTE — Progress Notes (Signed)
[facility]    HEMATOLOGY/ONCOLOGY CONSULTATION NOTE  Date of Service: 05/07/2016  Patient Care Team: Monico Blitz, MD as PCP - General (Internal Medicine) Dorann Ou, MD as Consulting Physician (Ophthalmology) Dr Lowanda Foster MD (Nephrology)  CHIEF COMPLAINTS/PURPOSE OF CONSULTATION:  IgG kappa MGUS --continued management  HISTORY OF PRESENTING ILLNESS:   Christy [name] is a wonderful 66 y.o. female who has been referred to [place] by Dr .Monico Blitz, MD  for evaluation and continued management of her IgG kappa MGUS.  Patient has a history of hypertension, dyslipidemia, coronary artery disease status post PCI, obstructive sleep apnea on CPAP, diabetes type 2, CKD stage 4, RTA type IV and bipolar disorder who was noted to have an elevation in her creatinine and was seen by nephrology in November 2014 and had a SPEP done as a part of the workup for her kidney disease and was noted to have an M spike of 0.6 with normal Lambda Light Chain Ratio. Immunofixation showed an IgG kappa monoclonal protein. Patient had hepatitis C antibody which was negative, ANA negative, C ANCA negative, p-ANCA negative, anti-double-stranded DNA antibody negative and complements were within normal limits. Her creatinine at that time was 1.32. Patient reports that increase potassium levels were concerned that initially triggered her nephrology evaluation. Patient had a hemoglobin of 11.8 at the time and no significant protein in the urine. Patient reports she got a bone scan which was noted to be negative. She was not recommended a bone marrow examination based on no other acute concerns regarding multiple myeloma.  Patient was being followed with Dr Tressie Stalker and was last seen in 01/2015 when her M-spike was 0.7 and did not show any significant increase. Labs in July 2017 showed a hemoglobin of 10.4 and a urine total protein creatinine ratio 0.2. Creatinine has been gradually increasing over the last couple of years and is now up to 2 with  BUN of 60 suggesting some element of dehydration. 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 She has had history of some hyperkalemia thought to be related to type IV RTA from her diabetes and possibly from Ace inhibitors which were discontinued.  Patient presents today for follow up. She states overall she has been doing well She denies any new bone pains. Her calcium levels have remained normal. Energy levels have remained stable. She states that her diabetes and hypertension have been a lot better controlled since she has lost about 50 pounds intentionally with diet and exercise and appropriate use of diuretics. She states that her BP has been so well controlled that she's been getting hypotensive with a low heart rate and feeling like she's about to past out. She had 2 of her BP meds removed.  MEDICAL HISTORY:  Past Medical History:  Diagnosis Date  . Bipolar disorder ([place])   . Coronary atherosclerosis of native coronary artery    BMS circ 2006, 70% PDA 2009  . Essential hypertension, benign   . Mixed hyperlipidemia   . OSA on CPAP   . Renal tubular acidosis, type IV   . SVT (supraventricular tachycardia) ([place])   . Type 2 diabetes mellitus ([facility])   CHF CKD stage 4 Obesity RTA type IV  SURGICAL HISTORY: Past Surgical History:  Procedure Laterality Date  . Acromioclavicular arthritis    . CARDIAC CATHETERIZATION N/A 03/29/2015   Procedure: Left Heart Cath and Coronary Angiography;  Surgeon: Troy Sine, MD;  Location: [facility]  INVASIVE CV LAB;  Service: Cardiovascular;  Laterality: N/A;  . RIght shoulder adhesive capsulitis    . Rotator cuff impingement syndrome    . Rotator cuff tear      SOCIAL HISTORY: Social History   Social History  . Marital status: Divorced    Spouse name: N/A  . Number of children: N/A  . Years of education: N/A   Occupational History  . [place] History Main Topics  . Smoking status: Former Smoker    Packs/day: 1.00    Years: 20.00    Types: Cigarettes    Start date: 10/30/1984    Quit date: 07/08/2013  . Smokeless tobacco: Never Used  . Alcohol use No  . Drug use: No  . Sexual activity: Not on file   Other Topics Concern  . Not on file   Social History Narrative   She has lived with some of her daughters. Works part-time for the city.     FAMILY HISTORY: Family History  Problem Relation Age of Onset  . Bipolar disorder Brother   . Diabetes Father   Maternal Aunt with breast cancer.   ALLERGIES:  is allergic to ace inhibitors; lisinopril; penicillins; tetracycline; and lamictal [lamotrigine].  MEDICATIONS:  Current Outpatient Prescriptions  Medication Sig Dispense Refill  . allopurinol (ZYLOPRIM) 300 MG tablet Take 150 mg by mouth daily.    Marland Kitchen aspirin 81 MG EC tablet Take 81 mg by mouth daily.      Marland Kitchen BESIVANCE 0.6 % SUSP INSTILL ONE DROP INTO LEFT EYE 4 TIMES A DAY FOR 2 DAYS AFTER EACH MONTHLY EYE INJECTION  12  . buPROPion (WELLBUTRIN XL) 300 MG 24 hr tablet Take 300 mg by mouth daily.      . calcium carbonate (OS-CAL) 600 MG TABS Take 600 mg by mouth daily.      . Cholecalciferol (VITAMIN D) 400 UNITS capsule Take 400 Units by mouth daily.      . divalproex (DEPAKOTE) 250 MG EC tablet Take 250 mg by mouth daily.      . isosorbide mononitrate (IMDUR) 60 MG 24 hr tablet Take 1.5 tablets (90 mg total) by mouth daily. 45 tablet 6  . labetalol (NORMODYNE) 100 MG tablet Take 100 mg by mouth 2 (two) times daily.    . Liraglutide (VICTOZA) 18 MG/3ML SOLN Inject 1.2 mg into the skin daily.     Marland Kitchen losartan (COZAAR) 25 MG tablet Take 25 mg by mouth daily.    . Multiple Vitamin (MULTIVITAMIN) tablet Take 1 tablet by mouth daily.      . nitroGLYCERIN (NITROLINGUAL) 0.4 MG/SPRAY spray Place 1 spray under the tongue every 5 (five) minutes x 3 doses as needed. 12 g 2  . pantoprazole (PROTONIX) 40 MG tablet Take 40  mg by mouth daily.      Marland Kitchen POLYETHYLENE GLYCOL 3350 PO Take by mouth daily.    . rosuvastatin (CRESTOR) 10 MG tablet Take 10 mg by mouth daily.    . sertraline (ZOLOFT) 100 MG tablet Take 100 mg by mouth daily.      Marland Kitchen torsemide (DEMADEX) 20 MG tablet Take 2 tablets (40 mg total) by mouth every morning.    . traZODone (DESYREL) 50 MG tablet Take 50 mg by mouth daily.      . TRESIBA FLEXTOUCH 200 UNIT/ML SOPN Inject 38 Units into the skin every morning.  2   No current facility-administered medications for this visit.  REVIEW OF SYSTEMS:    10 Point review of Systems was done is negative except as noted above.  PHYSICAL EXAMINATION: ECOG PERFORMANCE STATUS: 1 - Symptomatic but completely ambulatory  . Vitals:   05/07/16 1124  BP: (!) 137/48  Pulse: (!) 55  Resp: 18  Temp: 98.3 F (36.8 C)   Filed Weights   05/07/16 1124  Weight: 213 lb 9.6 oz (96.9 kg)   .Body mass index is 37.84 kg/m.  GENERAL:alert, in no acute distress and comfortable SKIN: skin color, texture, turgor are normal, no rashes or significant lesions EYES: normal, conjunctiva are pink and non-injected, sclera clear OROPHARYNX:no exudate, no erythema and lips, buccal mucosa, and tongue normal  NECK: supple, no JVD, thyroid normal size, non-tender, without nodularity LYMPH:  no palpable lymphadenopathy in the cervical, axillary or inguinal LUNGS: clear to auscultation with normal respiratory effort HEART: regular rate & rhythm,  no murmurs and trace lower extremity edema ABDOMEN: abdomen soft, non-tender, normoactive bowel sounds  Musculoskeletal: no cyanosis of digits and no clubbing  PSYCH: alert & oriented x 3 with fluent speech NEURO: no focal motor/sensory deficits  LABORATORY DATA:  I have reviewed the data as listed  . CBC Latest Ref Rng & Units 09/25/2015 03/29/2015 12/29/2007  WBC 4.0 - 10.5 K/uL 7.7 8.4 7.8  Hemoglobin 12.0 - 15.0 g/dL 11.0(L) 10.2(L) 10.7(L)  Hematocrit 36.0 - 46.0 % 34.9(L)  32.5(L) 31.5(L)  Platelets 150 - 400 K/uL 177 169 193    . CMP Latest Ref Rng & Units 05/07/2016 09/25/2015 03/29/2015  Glucose 65 - 99 mg/dL 210(H) 144(H) 163(H)  BUN 6 - 20 mg/dL 45(H) 46(H) 43(H)  Creatinine 0.44 - 1.00 mg/dL 1.47(H) 1.45(H) 1.35(H)  Sodium 135 - 145 mmol/L 137 139 144  Potassium 3.5 - 5.1 mmol/L 5.1 5.1 5.0  Chloride 101 - 111 mmol/L 105 107 107  CO2 22 - 32 mmol/L '26 26 24  '$ Calcium 8.9 - 10.3 mg/dL 8.7(L) 9.2 9.2  Total Protein 6.5 - 8.1 g/dL 6.9 7.2 -  Total Bilirubin 0.3 - 1.2 mg/dL 0.4 0.4 -  Alkaline Phos 38 - 126 U/L 74 72 -  AST 15 - 41 U/L 23 17 -  ALT 14 - 54 U/L 23 15 -     RADIOGRAPHIC STUDIES: I have personally reviewed the radiological images as listed and agreed with the findings in the report. No results found.  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 Recent outside labs show no hypercalcemia. SPEP in 09/2015 negative for monoclonal paraprotein. 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 Has not had significant proteinuria to be concerned about overt AL amyloidosis. No new bone pains  #2 elevated kappa And lambda free light chains with normal ratio. Likely due to poor SFLC clearance in the setting of CKD  #3 Normocytic anemia --likely from CKD.   Plan: - SPEP was negative the last time it was performed. Will follow up SPEP from today. If it continues to be negative for monoclonal paraprotein and also on repeat in 6 months then I will discharge patient from clinic.  - I have explained to the patient that her elevated light chains and anemia are likely due to her CKD. - RTC in 6 months with labs. If SPEP still  negative on next visit will discharge her from clinic.  Orders  Placed This Encounter  Procedures  . CBC with Differential    Standing Status:   Future    Standing Expiration Date:   01/06/2017  . Comprehensive metabolic panel    Standing Status:   Future    Standing Expiration Date:   01/06/2017  . Kappa/lambda light chains    Standing Status:   Future    Standing Expiration Date:   01/06/2017  . Protein electrophoresis, serum    Standing Status:   Future    Standing Expiration Date:   01/06/2017  . Folate    Standing Status:   Future    Standing Expiration Date:   01/06/2017  . Vitamin B12    Standing Status:   Future    Standing Expiration Date:   01/06/2017  . Ferritin    Standing Status:   Future    Standing Expiration Date:   01/06/2017  . Iron and TIBC    Standing Status:   Future    Standing Expiration Date:   01/06/2017     All of the patients questions were answered with apparent satisfaction. The patient knows to call the clinic with any problems, questions or concerns.  I spent 45 minutes counseling the patient face to face. The total time spent in the appointment was 50 minutes and more than 50% was on counseling and direct patient cares.    Sullivan Lone MD [place] AAHIVMS [facility] Hematology/Oncology Physician [facility]  (Office):       [phone] (Work cell):  [phone] (Fax):           [phone]  05/07/2016 12:45 PM

## 2016-05-08 LAB — KAPPA/LAMBDA LIGHT CHAINS
KAPPA FREE LGHT CHN: 45.5 mg/L — AB (ref 3.3–19.4)
KAPPA, LAMDA LIGHT CHAIN RATIO: 1.11 (ref 0.26–1.65)
LAMDA FREE LIGHT CHAINS: 40.9 mg/L — AB (ref 5.7–26.3)

## 2016-05-11 LAB — MULTIPLE MYELOMA PANEL, SERUM
ALBUMIN SERPL ELPH-MCNC: 3.6 g/dL (ref 2.9–4.4)
ALPHA 1: 0.2 g/dL (ref 0.0–0.4)
Albumin/Glob SerPl: 1.3 (ref 0.7–1.7)
Alpha2 Glob SerPl Elph-Mcnc: 0.5 g/dL (ref 0.4–1.0)
B-Globulin SerPl Elph-Mcnc: 0.9 g/dL (ref 0.7–1.3)
Gamma Glob SerPl Elph-Mcnc: 1.2 g/dL (ref 0.4–1.8)
Globulin, Total: 2.8 g/dL (ref 2.2–3.9)
IGA: 130 mg/dL (ref 87–352)
IGM, SERUM: 196 mg/dL (ref 26–217)
IgG (Immunoglobin G), Serum: 1122 mg/dL (ref 700–1600)
M Protein SerPl Elph-Mcnc: 0.6 g/dL — ABNORMAL HIGH
Total Protein ELP: 6.4 g/dL (ref 6.0–8.5)

## 2016-05-13 DIAGNOSIS — S76002A Unspecified injury of muscle, fascia and tendon of left hip, initial encounter: Secondary | ICD-10-CM | POA: Diagnosis not present

## 2016-05-13 DIAGNOSIS — S338XXA Sprain of other parts of lumbar spine and pelvis, initial encounter: Secondary | ICD-10-CM | POA: Diagnosis not present

## 2016-05-13 DIAGNOSIS — M9903 Segmental and somatic dysfunction of lumbar region: Secondary | ICD-10-CM | POA: Diagnosis not present

## 2016-05-13 DIAGNOSIS — M5442 Lumbago with sciatica, left side: Secondary | ICD-10-CM | POA: Diagnosis not present

## 2016-05-13 DIAGNOSIS — M47816 Spondylosis without myelopathy or radiculopathy, lumbar region: Secondary | ICD-10-CM | POA: Diagnosis not present

## 2016-05-18 DIAGNOSIS — M5442 Lumbago with sciatica, left side: Secondary | ICD-10-CM | POA: Diagnosis not present

## 2016-05-18 DIAGNOSIS — M9903 Segmental and somatic dysfunction of lumbar region: Secondary | ICD-10-CM | POA: Diagnosis not present

## 2016-05-18 DIAGNOSIS — S338XXA Sprain of other parts of lumbar spine and pelvis, initial encounter: Secondary | ICD-10-CM | POA: Diagnosis not present

## 2016-05-18 DIAGNOSIS — S76002A Unspecified injury of muscle, fascia and tendon of left hip, initial encounter: Secondary | ICD-10-CM | POA: Diagnosis not present

## 2016-05-18 DIAGNOSIS — M47816 Spondylosis without myelopathy or radiculopathy, lumbar region: Secondary | ICD-10-CM | POA: Diagnosis not present

## 2016-05-21 DIAGNOSIS — M47816 Spondylosis without myelopathy or radiculopathy, lumbar region: Secondary | ICD-10-CM | POA: Diagnosis not present

## 2016-05-21 DIAGNOSIS — S76002A Unspecified injury of muscle, fascia and tendon of left hip, initial encounter: Secondary | ICD-10-CM | POA: Diagnosis not present

## 2016-05-21 DIAGNOSIS — S338XXA Sprain of other parts of lumbar spine and pelvis, initial encounter: Secondary | ICD-10-CM | POA: Diagnosis not present

## 2016-05-21 DIAGNOSIS — M9903 Segmental and somatic dysfunction of lumbar region: Secondary | ICD-10-CM | POA: Diagnosis not present

## 2016-05-21 DIAGNOSIS — M5442 Lumbago with sciatica, left side: Secondary | ICD-10-CM | POA: Diagnosis not present

## 2016-05-22 DIAGNOSIS — E119 Type 2 diabetes mellitus without complications: Secondary | ICD-10-CM | POA: Diagnosis not present

## 2016-05-22 DIAGNOSIS — I10 Essential (primary) hypertension: Secondary | ICD-10-CM | POA: Diagnosis not present

## 2016-05-22 DIAGNOSIS — E7800 Pure hypercholesterolemia, unspecified: Secondary | ICD-10-CM | POA: Diagnosis not present

## 2016-05-22 DIAGNOSIS — I2510 Atherosclerotic heart disease of native coronary artery without angina pectoris: Secondary | ICD-10-CM | POA: Diagnosis not present

## 2016-05-25 DIAGNOSIS — S338XXA Sprain of other parts of lumbar spine and pelvis, initial encounter: Secondary | ICD-10-CM | POA: Diagnosis not present

## 2016-05-25 DIAGNOSIS — M9903 Segmental and somatic dysfunction of lumbar region: Secondary | ICD-10-CM | POA: Diagnosis not present

## 2016-05-25 DIAGNOSIS — S76002A Unspecified injury of muscle, fascia and tendon of left hip, initial encounter: Secondary | ICD-10-CM | POA: Diagnosis not present

## 2016-05-25 DIAGNOSIS — M5442 Lumbago with sciatica, left side: Secondary | ICD-10-CM | POA: Diagnosis not present

## 2016-05-25 DIAGNOSIS — M47816 Spondylosis without myelopathy or radiculopathy, lumbar region: Secondary | ICD-10-CM | POA: Diagnosis not present

## 2016-05-27 DIAGNOSIS — S76002A Unspecified injury of muscle, fascia and tendon of left hip, initial encounter: Secondary | ICD-10-CM | POA: Diagnosis not present

## 2016-05-27 DIAGNOSIS — M5442 Lumbago with sciatica, left side: Secondary | ICD-10-CM | POA: Diagnosis not present

## 2016-05-27 DIAGNOSIS — S338XXA Sprain of other parts of lumbar spine and pelvis, initial encounter: Secondary | ICD-10-CM | POA: Diagnosis not present

## 2016-05-27 DIAGNOSIS — M9903 Segmental and somatic dysfunction of lumbar region: Secondary | ICD-10-CM | POA: Diagnosis not present

## 2016-05-27 DIAGNOSIS — M47816 Spondylosis without myelopathy or radiculopathy, lumbar region: Secondary | ICD-10-CM | POA: Diagnosis not present

## 2016-05-28 ENCOUNTER — Encounter (INDEPENDENT_AMBULATORY_CARE_PROVIDER_SITE_OTHER): Payer: Medicare Other | Admitting: Ophthalmology

## 2016-05-28 DIAGNOSIS — H43813 Vitreous degeneration, bilateral: Secondary | ICD-10-CM | POA: Diagnosis not present

## 2016-05-28 DIAGNOSIS — E11311 Type 2 diabetes mellitus with unspecified diabetic retinopathy with macular edema: Secondary | ICD-10-CM | POA: Diagnosis not present

## 2016-05-28 DIAGNOSIS — I10 Essential (primary) hypertension: Secondary | ICD-10-CM | POA: Diagnosis not present

## 2016-05-28 DIAGNOSIS — E113312 Type 2 diabetes mellitus with moderate nonproliferative diabetic retinopathy with macular edema, left eye: Secondary | ICD-10-CM

## 2016-05-28 DIAGNOSIS — H35033 Hypertensive retinopathy, bilateral: Secondary | ICD-10-CM | POA: Diagnosis not present

## 2016-05-28 DIAGNOSIS — E113391 Type 2 diabetes mellitus with moderate nonproliferative diabetic retinopathy without macular edema, right eye: Secondary | ICD-10-CM

## 2016-06-02 DIAGNOSIS — M5442 Lumbago with sciatica, left side: Secondary | ICD-10-CM | POA: Diagnosis not present

## 2016-06-02 DIAGNOSIS — S338XXA Sprain of other parts of lumbar spine and pelvis, initial encounter: Secondary | ICD-10-CM | POA: Diagnosis not present

## 2016-06-02 DIAGNOSIS — M9903 Segmental and somatic dysfunction of lumbar region: Secondary | ICD-10-CM | POA: Diagnosis not present

## 2016-06-02 DIAGNOSIS — M47816 Spondylosis without myelopathy or radiculopathy, lumbar region: Secondary | ICD-10-CM | POA: Diagnosis not present

## 2016-06-02 DIAGNOSIS — S76002A Unspecified injury of muscle, fascia and tendon of left hip, initial encounter: Secondary | ICD-10-CM | POA: Diagnosis not present

## 2016-06-04 DIAGNOSIS — M47816 Spondylosis without myelopathy or radiculopathy, lumbar region: Secondary | ICD-10-CM | POA: Diagnosis not present

## 2016-06-04 DIAGNOSIS — M9903 Segmental and somatic dysfunction of lumbar region: Secondary | ICD-10-CM | POA: Diagnosis not present

## 2016-06-04 DIAGNOSIS — M5442 Lumbago with sciatica, left side: Secondary | ICD-10-CM | POA: Diagnosis not present

## 2016-06-04 DIAGNOSIS — S76002A Unspecified injury of muscle, fascia and tendon of left hip, initial encounter: Secondary | ICD-10-CM | POA: Diagnosis not present

## 2016-06-04 DIAGNOSIS — S338XXA Sprain of other parts of lumbar spine and pelvis, initial encounter: Secondary | ICD-10-CM | POA: Diagnosis not present

## 2016-06-05 NOTE — Progress Notes (Signed)
Cardiology Office Note  Date: 06/08/2016   ID: ARTIS BUECHELE, DOB 1950/06/20, MRN [id number]  PCP: Monico Blitz, MD  Primary Cardiologist: Rozann Lesches, MD   Chief Complaint  Patient presents with  . Coronary Artery Disease    History of Present Illness: Christy [name] is a 66 y.o. female last seen in November 2017. She presents for a routine follow-up visit. She is not reporting any chest pains or progressive shortness of breath. No rapid palpitations or dizziness. We went over home blood pressure and heart rate checks. She continues to have bradycardia with heart rates in the 40s to 50s at times, also intermittent hypotension. She has cut her labetalol back to once a day, we discussed actually stopping the medication altogether at this point.  Cardiac regimen in the absence of labetalol will include aspirin, Imdur, Cozaar, Crestor, Demadex, and as needed nitroglycerin. She reports good control of leg edema, weight is stable overall.  I personally reviewed her ECG today which shows sinus rhythm with IVCD and possible old anteroseptal infarct pattern.  Past Medical History:  Diagnosis Date  . Bipolar disorder ([place])   . Coronary atherosclerosis of native coronary artery    BMS circ 2006, 70% PDA 2009  . Essential hypertension, benign   . Mixed hyperlipidemia   . OSA on CPAP   . Renal tubular acidosis, type IV   . SVT (supraventricular tachycardia) ([place])   . Type 2 diabetes mellitus ([place])     Past Surgical History:  Procedure Laterality Date  . Acromioclavicular arthritis    . CARDIAC CATHETERIZATION N/A 03/29/2015   Procedure: Left Heart Cath and Coronary Angiography;  Surgeon: Troy Sine, MD;  Location: [facility];  Service: Cardiovascular;  Laterality: N/A;  . RIght shoulder adhesive capsulitis    . Rotator cuff impingement syndrome    . Rotator cuff tear      Current Outpatient Prescriptions  Medication Sig Dispense Refill  . allopurinol (ZYLOPRIM)  300 MG tablet Take 150 mg by mouth daily.    Marland Kitchen aspirin 81 MG EC tablet Take 81 mg by mouth daily.      Marland Kitchen buPROPion (WELLBUTRIN XL) 300 MG 24 hr tablet Take 300 mg by mouth daily.      . calcium carbonate (OS-CAL) 600 MG TABS Take 600 mg by mouth daily.      . Cholecalciferol (VITAMIN D) 400 UNITS capsule Take 400 Units by mouth daily.      . divalproex (DEPAKOTE) 250 MG EC tablet Take 250 mg by mouth daily.      . isosorbide mononitrate (IMDUR) 60 MG 24 hr tablet Take 1.5 tablets (90 mg total) by mouth daily. 45 tablet 6  . labetalol (NORMODYNE) 100 MG tablet Take 100 mg by mouth daily.     . Liraglutide (VICTOZA) 18 MG/3ML SOLN Inject 1.2 mg into the skin daily.     Marland Kitchen losartan (COZAAR) 25 MG tablet Take 25 mg by mouth daily.    . Multiple Vitamin (MULTIVITAMIN) tablet Take 1 tablet by mouth daily.      . nitroGLYCERIN (NITROLINGUAL) 0.4 MG/SPRAY spray Place 1 spray under the tongue every 5 (five) minutes x 3 doses as needed. 12 g 2  . pantoprazole (PROTONIX) 40 MG tablet Take 40 mg by mouth every other day.     Marland Kitchen POLYETHYLENE GLYCOL 3350 PO Take by mouth daily.    . rosuvastatin (CRESTOR) 10 MG tablet Take 10 mg by mouth daily.    Marland Kitchen  sertraline (ZOLOFT) 100 MG tablet Take 100 mg by mouth daily.      Marland Kitchen torsemide (DEMADEX) 20 MG tablet Take 2 tablets (40 mg total) by mouth every morning.    . traZODone (DESYREL) 50 MG tablet Take 50 mg by mouth daily.      . TRESIBA FLEXTOUCH 200 UNIT/ML SOPN Inject 38 Units into the skin every morning.  2   No current facility-administered medications for this visit.    Allergies:  Ace inhibitors; Lisinopril; Penicillins; Tetracycline; and Lamictal [lamotrigine]   Social History: The patient  reports that she quit smoking about 2 years ago. Her smoking use included Cigarettes. She started smoking about 31 years ago. She has a 20.00 pack-year smoking history. She has never used smokeless tobacco. She reports that she does not drink alcohol or use drugs.    ROS:  Please see the history of present illness. Otherwise, complete review of systems is positive for none.  All other systems are reviewed and negative.   Physical Exam: VS:  BP 129/69   Pulse (!) 57   Ht 5\' 3"  (1.6 m)   Wt 212 lb (96.2 kg)   SpO2 96%   BMI 37.55 kg/m , BMI Body mass index is 37.55 kg/m.  Wt Readings from Last 3 Encounters:  06/08/16 212 lb (96.2 kg)  05/07/16 213 lb 9.6 oz (96.9 kg)  11/27/15 219 lb (99.3 kg)    Obese woman, no distress.  HEENT: Conjunctiva and lids normal, oropharynx clear.  Neck: Supple, no elevated JVP or carotid bruits, no thyromegaly.  Lungs: Clear to auscultation, nonlabored breathing at rest.  Cardiac: Regular rate and rhythm, no S3, 2/6 systolic murmur, no pericardial rub.  Abdomen: Soft, nontender, bowel sounds present, no guarding or rebound.  Extremities: Ankle edema, distal pulses 2+. Skin: Warm and dry. Musculoskeletal: No kyphosis. Neuropsychiatric: Alert and oriented 3, affect appropriate.  ECG: I personally reviewed the tracing from 03/07/2015 which showed sinus bradycardia with prolonged PR interval and IVCD.  Recent Labwork: 09/25/2015: Hemoglobin 11.0; Platelets 177 05/07/2016: ALT 23; AST 23; BUN 45; Creatinine, Ser 1.47; Potassium 5.1; Sodium 137   Other Studies Reviewed Today:  Cardiac catheterization 03/29/2015: No significant coronary obstructive disease with evidence for widely patent stent in the proximal left circumflex artery, a normal LAD and a normal very large dominant RCA.  Systemic hypertension with elevated left ventricular end-diastolic pressure at 37 mm.  Echocardiogram 03/13/2015: Study Conclusions  - Left ventricle: The cavity size was normal. Wall thickness was   increased in a pattern of mild LVH. Systolic function was normal.   The estimated ejection fraction was in the range of 60% to 65%.   Wall motion was normal; there were no regional wall motion   abnormalities. Doppler parameters  are consistent with restrictive   physiology, indicative of decreased left ventricular diastolic   compliance and/or increased left atrial pressure. - Aortic valve: There was mild regurgitation. Valve area (VTI):   2.84 cm^2. Valve area (Vmax): 2.77 cm^2. Valve area (Vmean): 2.57   cm^2. - Mitral valve: Mildly calcified annulus. Mildly thickened leaflets   . There was mild regurgitation. - Left atrium: The atrium was severely dilated. - Right atrium: The atrium was mildly dilated. - Pulmonary arteries: Systolic pressure was mildly increased. PA   peak pressure: 37 mm Hg (S). - Technically difficult study.  Assessment and Plan:  1. CAD status post BMS to circumflex in 2006. Recent cardiac catheterization in March of last year showed patent stent  site without progressive disease to require revascularization. Plan is to continue medical therapy. She is not reporting any angina symptoms.  2. Essential hypertension. She has had episodes of hypotension based on home blood pressure checks. We will continue Cozaar but planning to stop labetalol.  3. Symptomatic bradycardia, she has already been taken off Cardizem CD and we will next stop labetalol. Likely has conduction system disease, IVCD by ECG. She has had no syncope.  4. Chronic diastolic heart failure, weight is stable on Demadex.  Current medicines were reviewed with the patient today.   Orders Placed This Encounter  Procedures  . EKG 12-Lead    Disposition: Follow-up in 6 months.  Signed, Satira Sark, MD, [facility] 06/08/2016 10:12 AM    [facility], [place], [place] [address] Phone: [phone]; Fax: [phone]

## 2016-06-08 ENCOUNTER — Ambulatory Visit (INDEPENDENT_AMBULATORY_CARE_PROVIDER_SITE_OTHER): Payer: Medicare Other | Admitting: Cardiology

## 2016-06-08 ENCOUNTER — Encounter: Payer: Self-pay | Admitting: Cardiology

## 2016-06-08 VITALS — BP 129/69 | HR 57 | Ht 63.0 in | Wt 212.0 lb

## 2016-06-08 DIAGNOSIS — I2510 Atherosclerotic heart disease of native coronary artery without angina pectoris: Secondary | ICD-10-CM

## 2016-06-08 DIAGNOSIS — R001 Bradycardia, unspecified: Secondary | ICD-10-CM | POA: Diagnosis not present

## 2016-06-08 DIAGNOSIS — I5032 Chronic diastolic (congestive) heart failure: Secondary | ICD-10-CM

## 2016-06-08 DIAGNOSIS — I10 Essential (primary) hypertension: Secondary | ICD-10-CM

## 2016-06-08 NOTE — Patient Instructions (Signed)
Medication Instructions:  STOP TAKING LABETALOL CONTINUE ALL OTHER MEDICATIONS AS PRESCRIBED  Labwork: NONE  Testing/Procedures: NONE  Follow-Up: Your physician wants you to follow-up in: [place] DR. Domenic Polite You will receive a reminder letter in the mail two months in advance. If you don't receive a letter, please call our office to schedule the follow-up appointment.  Any Other Special Instructions Will Be Listed Below (If Applicable).  If you need a refill on your cardiac medications before your next appointment, please call your pharmacy.

## 2016-06-11 DIAGNOSIS — M5442 Lumbago with sciatica, left side: Secondary | ICD-10-CM | POA: Diagnosis not present

## 2016-06-11 DIAGNOSIS — S76002A Unspecified injury of muscle, fascia and tendon of left hip, initial encounter: Secondary | ICD-10-CM | POA: Diagnosis not present

## 2016-06-11 DIAGNOSIS — M9903 Segmental and somatic dysfunction of lumbar region: Secondary | ICD-10-CM | POA: Diagnosis not present

## 2016-06-11 DIAGNOSIS — M47816 Spondylosis without myelopathy or radiculopathy, lumbar region: Secondary | ICD-10-CM | POA: Diagnosis not present

## 2016-06-11 DIAGNOSIS — S338XXA Sprain of other parts of lumbar spine and pelvis, initial encounter: Secondary | ICD-10-CM | POA: Diagnosis not present

## 2016-06-16 DIAGNOSIS — D509 Iron deficiency anemia, unspecified: Secondary | ICD-10-CM | POA: Diagnosis not present

## 2016-06-16 DIAGNOSIS — I129 Hypertensive chronic kidney disease with stage 1 through stage 4 chronic kidney disease, or unspecified chronic kidney disease: Secondary | ICD-10-CM | POA: Diagnosis not present

## 2016-06-16 DIAGNOSIS — Z79899 Other long term (current) drug therapy: Secondary | ICD-10-CM | POA: Diagnosis not present

## 2016-06-16 DIAGNOSIS — R809 Proteinuria, unspecified: Secondary | ICD-10-CM | POA: Diagnosis not present

## 2016-06-16 DIAGNOSIS — E559 Vitamin D deficiency, unspecified: Secondary | ICD-10-CM | POA: Diagnosis not present

## 2016-06-23 DIAGNOSIS — E669 Obesity, unspecified: Secondary | ICD-10-CM | POA: Diagnosis not present

## 2016-06-23 DIAGNOSIS — N183 Chronic kidney disease, stage 3 (moderate): Secondary | ICD-10-CM | POA: Diagnosis not present

## 2016-06-23 DIAGNOSIS — I10 Essential (primary) hypertension: Secondary | ICD-10-CM | POA: Diagnosis not present

## 2016-06-23 DIAGNOSIS — D638 Anemia in other chronic diseases classified elsewhere: Secondary | ICD-10-CM | POA: Diagnosis not present

## 2016-06-24 DIAGNOSIS — S338XXA Sprain of other parts of lumbar spine and pelvis, initial encounter: Secondary | ICD-10-CM | POA: Diagnosis not present

## 2016-06-24 DIAGNOSIS — M9903 Segmental and somatic dysfunction of lumbar region: Secondary | ICD-10-CM | POA: Diagnosis not present

## 2016-06-24 DIAGNOSIS — S76002A Unspecified injury of muscle, fascia and tendon of left hip, initial encounter: Secondary | ICD-10-CM | POA: Diagnosis not present

## 2016-06-24 DIAGNOSIS — M47816 Spondylosis without myelopathy or radiculopathy, lumbar region: Secondary | ICD-10-CM | POA: Diagnosis not present

## 2016-06-24 DIAGNOSIS — M5442 Lumbago with sciatica, left side: Secondary | ICD-10-CM | POA: Diagnosis not present

## 2016-06-29 DIAGNOSIS — M9903 Segmental and somatic dysfunction of lumbar region: Secondary | ICD-10-CM | POA: Diagnosis not present

## 2016-06-29 DIAGNOSIS — S76002A Unspecified injury of muscle, fascia and tendon of left hip, initial encounter: Secondary | ICD-10-CM | POA: Diagnosis not present

## 2016-06-29 DIAGNOSIS — I4891 Unspecified atrial fibrillation: Secondary | ICD-10-CM | POA: Diagnosis not present

## 2016-06-29 DIAGNOSIS — I10 Essential (primary) hypertension: Secondary | ICD-10-CM | POA: Diagnosis not present

## 2016-06-29 DIAGNOSIS — I509 Heart failure, unspecified: Secondary | ICD-10-CM | POA: Diagnosis not present

## 2016-06-29 DIAGNOSIS — F319 Bipolar disorder, unspecified: Secondary | ICD-10-CM | POA: Diagnosis not present

## 2016-06-29 DIAGNOSIS — Z87891 Personal history of nicotine dependence: Secondary | ICD-10-CM | POA: Diagnosis not present

## 2016-06-29 DIAGNOSIS — E7800 Pure hypercholesterolemia, unspecified: Secondary | ICD-10-CM | POA: Diagnosis not present

## 2016-06-29 DIAGNOSIS — M5442 Lumbago with sciatica, left side: Secondary | ICD-10-CM | POA: Diagnosis not present

## 2016-06-29 DIAGNOSIS — Z6839 Body mass index (BMI) 39.0-39.9, adult: Secondary | ICD-10-CM | POA: Diagnosis not present

## 2016-06-29 DIAGNOSIS — E1122 Type 2 diabetes mellitus with diabetic chronic kidney disease: Secondary | ICD-10-CM | POA: Diagnosis not present

## 2016-06-29 DIAGNOSIS — N183 Chronic kidney disease, stage 3 (moderate): Secondary | ICD-10-CM | POA: Diagnosis not present

## 2016-06-29 DIAGNOSIS — E1142 Type 2 diabetes mellitus with diabetic polyneuropathy: Secondary | ICD-10-CM | POA: Diagnosis not present

## 2016-06-29 DIAGNOSIS — M47816 Spondylosis without myelopathy or radiculopathy, lumbar region: Secondary | ICD-10-CM | POA: Diagnosis not present

## 2016-06-29 DIAGNOSIS — Z299 Encounter for prophylactic measures, unspecified: Secondary | ICD-10-CM | POA: Diagnosis not present

## 2016-06-29 DIAGNOSIS — S338XXA Sprain of other parts of lumbar spine and pelvis, initial encounter: Secondary | ICD-10-CM | POA: Diagnosis not present

## 2016-06-29 DIAGNOSIS — E1165 Type 2 diabetes mellitus with hyperglycemia: Secondary | ICD-10-CM | POA: Diagnosis not present

## 2016-07-02 DIAGNOSIS — E1140 Type 2 diabetes mellitus with diabetic neuropathy, unspecified: Secondary | ICD-10-CM | POA: Diagnosis not present

## 2016-07-02 DIAGNOSIS — L110 Acquired keratosis follicularis: Secondary | ICD-10-CM | POA: Diagnosis not present

## 2016-07-02 DIAGNOSIS — L609 Nail disorder, unspecified: Secondary | ICD-10-CM | POA: Diagnosis not present

## 2016-07-06 DIAGNOSIS — M47816 Spondylosis without myelopathy or radiculopathy, lumbar region: Secondary | ICD-10-CM | POA: Diagnosis not present

## 2016-07-06 DIAGNOSIS — S76002A Unspecified injury of muscle, fascia and tendon of left hip, initial encounter: Secondary | ICD-10-CM | POA: Diagnosis not present

## 2016-07-06 DIAGNOSIS — M9903 Segmental and somatic dysfunction of lumbar region: Secondary | ICD-10-CM | POA: Diagnosis not present

## 2016-07-06 DIAGNOSIS — M5442 Lumbago with sciatica, left side: Secondary | ICD-10-CM | POA: Diagnosis not present

## 2016-07-06 DIAGNOSIS — S338XXA Sprain of other parts of lumbar spine and pelvis, initial encounter: Secondary | ICD-10-CM | POA: Diagnosis not present

## 2016-07-13 DIAGNOSIS — M5442 Lumbago with sciatica, left side: Secondary | ICD-10-CM | POA: Diagnosis not present

## 2016-07-13 DIAGNOSIS — S338XXA Sprain of other parts of lumbar spine and pelvis, initial encounter: Secondary | ICD-10-CM | POA: Diagnosis not present

## 2016-07-13 DIAGNOSIS — S76002A Unspecified injury of muscle, fascia and tendon of left hip, initial encounter: Secondary | ICD-10-CM | POA: Diagnosis not present

## 2016-07-13 DIAGNOSIS — M47816 Spondylosis without myelopathy or radiculopathy, lumbar region: Secondary | ICD-10-CM | POA: Diagnosis not present

## 2016-07-13 DIAGNOSIS — M9903 Segmental and somatic dysfunction of lumbar region: Secondary | ICD-10-CM | POA: Diagnosis not present

## 2016-07-20 DIAGNOSIS — M47816 Spondylosis without myelopathy or radiculopathy, lumbar region: Secondary | ICD-10-CM | POA: Diagnosis not present

## 2016-07-20 DIAGNOSIS — S76002A Unspecified injury of muscle, fascia and tendon of left hip, initial encounter: Secondary | ICD-10-CM | POA: Diagnosis not present

## 2016-07-20 DIAGNOSIS — M5442 Lumbago with sciatica, left side: Secondary | ICD-10-CM | POA: Diagnosis not present

## 2016-07-20 DIAGNOSIS — S338XXA Sprain of other parts of lumbar spine and pelvis, initial encounter: Secondary | ICD-10-CM | POA: Diagnosis not present

## 2016-07-20 DIAGNOSIS — M9903 Segmental and somatic dysfunction of lumbar region: Secondary | ICD-10-CM | POA: Diagnosis not present

## 2016-07-30 ENCOUNTER — Encounter (INDEPENDENT_AMBULATORY_CARE_PROVIDER_SITE_OTHER): Payer: Medicare Other | Admitting: Ophthalmology

## 2016-07-30 DIAGNOSIS — I10 Essential (primary) hypertension: Secondary | ICD-10-CM | POA: Diagnosis not present

## 2016-07-30 DIAGNOSIS — E113391 Type 2 diabetes mellitus with moderate nonproliferative diabetic retinopathy without macular edema, right eye: Secondary | ICD-10-CM | POA: Diagnosis not present

## 2016-07-30 DIAGNOSIS — E113312 Type 2 diabetes mellitus with moderate nonproliferative diabetic retinopathy with macular edema, left eye: Secondary | ICD-10-CM

## 2016-07-30 DIAGNOSIS — H35033 Hypertensive retinopathy, bilateral: Secondary | ICD-10-CM | POA: Diagnosis not present

## 2016-07-30 DIAGNOSIS — E11311 Type 2 diabetes mellitus with unspecified diabetic retinopathy with macular edema: Secondary | ICD-10-CM

## 2016-07-30 DIAGNOSIS — H43813 Vitreous degeneration, bilateral: Secondary | ICD-10-CM

## 2016-08-04 DIAGNOSIS — F411 Generalized anxiety disorder: Secondary | ICD-10-CM | POA: Diagnosis not present

## 2016-08-04 DIAGNOSIS — F3112 Bipolar disorder, current episode manic without psychotic features, moderate: Secondary | ICD-10-CM | POA: Diagnosis not present

## 2016-08-04 DIAGNOSIS — F4310 Post-traumatic stress disorder, unspecified: Secondary | ICD-10-CM | POA: Diagnosis not present

## 2016-08-06 DIAGNOSIS — S338XXA Sprain of other parts of lumbar spine and pelvis, initial encounter: Secondary | ICD-10-CM | POA: Diagnosis not present

## 2016-08-06 DIAGNOSIS — S76002A Unspecified injury of muscle, fascia and tendon of left hip, initial encounter: Secondary | ICD-10-CM | POA: Diagnosis not present

## 2016-08-06 DIAGNOSIS — M9903 Segmental and somatic dysfunction of lumbar region: Secondary | ICD-10-CM | POA: Diagnosis not present

## 2016-08-06 DIAGNOSIS — M47816 Spondylosis without myelopathy or radiculopathy, lumbar region: Secondary | ICD-10-CM | POA: Diagnosis not present

## 2016-08-06 DIAGNOSIS — M5442 Lumbago with sciatica, left side: Secondary | ICD-10-CM | POA: Diagnosis not present

## 2016-08-13 DIAGNOSIS — E11319 Type 2 diabetes mellitus with unspecified diabetic retinopathy without macular edema: Secondary | ICD-10-CM | POA: Diagnosis not present

## 2016-08-20 DIAGNOSIS — M5442 Lumbago with sciatica, left side: Secondary | ICD-10-CM | POA: Diagnosis not present

## 2016-08-20 DIAGNOSIS — S338XXA Sprain of other parts of lumbar spine and pelvis, initial encounter: Secondary | ICD-10-CM | POA: Diagnosis not present

## 2016-08-20 DIAGNOSIS — S76002A Unspecified injury of muscle, fascia and tendon of left hip, initial encounter: Secondary | ICD-10-CM | POA: Diagnosis not present

## 2016-08-20 DIAGNOSIS — M9903 Segmental and somatic dysfunction of lumbar region: Secondary | ICD-10-CM | POA: Diagnosis not present

## 2016-08-20 DIAGNOSIS — M47816 Spondylosis without myelopathy or radiculopathy, lumbar region: Secondary | ICD-10-CM | POA: Diagnosis not present

## 2016-08-26 DIAGNOSIS — I2510 Atherosclerotic heart disease of native coronary artery without angina pectoris: Secondary | ICD-10-CM | POA: Diagnosis not present

## 2016-08-26 DIAGNOSIS — E7800 Pure hypercholesterolemia, unspecified: Secondary | ICD-10-CM | POA: Diagnosis not present

## 2016-08-26 DIAGNOSIS — I10 Essential (primary) hypertension: Secondary | ICD-10-CM | POA: Diagnosis not present

## 2016-08-26 DIAGNOSIS — E119 Type 2 diabetes mellitus without complications: Secondary | ICD-10-CM | POA: Diagnosis not present

## 2016-09-08 DIAGNOSIS — S338XXA Sprain of other parts of lumbar spine and pelvis, initial encounter: Secondary | ICD-10-CM | POA: Diagnosis not present

## 2016-09-08 DIAGNOSIS — M47816 Spondylosis without myelopathy or radiculopathy, lumbar region: Secondary | ICD-10-CM | POA: Diagnosis not present

## 2016-09-08 DIAGNOSIS — M9903 Segmental and somatic dysfunction of lumbar region: Secondary | ICD-10-CM | POA: Diagnosis not present

## 2016-09-08 DIAGNOSIS — M5442 Lumbago with sciatica, left side: Secondary | ICD-10-CM | POA: Diagnosis not present

## 2016-09-08 DIAGNOSIS — S76002A Unspecified injury of muscle, fascia and tendon of left hip, initial encounter: Secondary | ICD-10-CM | POA: Diagnosis not present

## 2016-09-17 DIAGNOSIS — L110 Acquired keratosis follicularis: Secondary | ICD-10-CM | POA: Diagnosis not present

## 2016-09-17 DIAGNOSIS — L609 Nail disorder, unspecified: Secondary | ICD-10-CM | POA: Diagnosis not present

## 2016-09-17 DIAGNOSIS — E1140 Type 2 diabetes mellitus with diabetic neuropathy, unspecified: Secondary | ICD-10-CM | POA: Diagnosis not present

## 2016-09-22 DIAGNOSIS — M47816 Spondylosis without myelopathy or radiculopathy, lumbar region: Secondary | ICD-10-CM | POA: Diagnosis not present

## 2016-09-22 DIAGNOSIS — M9903 Segmental and somatic dysfunction of lumbar region: Secondary | ICD-10-CM | POA: Diagnosis not present

## 2016-09-22 DIAGNOSIS — S76002A Unspecified injury of muscle, fascia and tendon of left hip, initial encounter: Secondary | ICD-10-CM | POA: Diagnosis not present

## 2016-09-22 DIAGNOSIS — S338XXA Sprain of other parts of lumbar spine and pelvis, initial encounter: Secondary | ICD-10-CM | POA: Diagnosis not present

## 2016-09-22 DIAGNOSIS — M5442 Lumbago with sciatica, left side: Secondary | ICD-10-CM | POA: Diagnosis not present

## 2016-09-28 DIAGNOSIS — Z23 Encounter for immunization: Secondary | ICD-10-CM | POA: Diagnosis not present

## 2016-10-05 DIAGNOSIS — Z6841 Body Mass Index (BMI) 40.0 and over, adult: Secondary | ICD-10-CM | POA: Diagnosis not present

## 2016-10-05 DIAGNOSIS — I2510 Atherosclerotic heart disease of native coronary artery without angina pectoris: Secondary | ICD-10-CM | POA: Diagnosis not present

## 2016-10-05 DIAGNOSIS — G4733 Obstructive sleep apnea (adult) (pediatric): Secondary | ICD-10-CM | POA: Diagnosis not present

## 2016-10-05 DIAGNOSIS — N183 Chronic kidney disease, stage 3 (moderate): Secondary | ICD-10-CM | POA: Diagnosis not present

## 2016-10-05 DIAGNOSIS — E1165 Type 2 diabetes mellitus with hyperglycemia: Secondary | ICD-10-CM | POA: Diagnosis not present

## 2016-10-05 DIAGNOSIS — I4891 Unspecified atrial fibrillation: Secondary | ICD-10-CM | POA: Diagnosis not present

## 2016-10-05 DIAGNOSIS — F319 Bipolar disorder, unspecified: Secondary | ICD-10-CM | POA: Diagnosis not present

## 2016-10-05 DIAGNOSIS — E1122 Type 2 diabetes mellitus with diabetic chronic kidney disease: Secondary | ICD-10-CM | POA: Diagnosis not present

## 2016-10-05 DIAGNOSIS — E1142 Type 2 diabetes mellitus with diabetic polyneuropathy: Secondary | ICD-10-CM | POA: Diagnosis not present

## 2016-10-05 DIAGNOSIS — Z299 Encounter for prophylactic measures, unspecified: Secondary | ICD-10-CM | POA: Diagnosis not present

## 2016-10-05 DIAGNOSIS — I509 Heart failure, unspecified: Secondary | ICD-10-CM | POA: Diagnosis not present

## 2016-10-05 DIAGNOSIS — I10 Essential (primary) hypertension: Secondary | ICD-10-CM | POA: Diagnosis not present

## 2016-10-06 DIAGNOSIS — M5442 Lumbago with sciatica, left side: Secondary | ICD-10-CM | POA: Diagnosis not present

## 2016-10-06 DIAGNOSIS — S338XXA Sprain of other parts of lumbar spine and pelvis, initial encounter: Secondary | ICD-10-CM | POA: Diagnosis not present

## 2016-10-06 DIAGNOSIS — S76002A Unspecified injury of muscle, fascia and tendon of left hip, initial encounter: Secondary | ICD-10-CM | POA: Diagnosis not present

## 2016-10-06 DIAGNOSIS — M9903 Segmental and somatic dysfunction of lumbar region: Secondary | ICD-10-CM | POA: Diagnosis not present

## 2016-10-06 DIAGNOSIS — M47816 Spondylosis without myelopathy or radiculopathy, lumbar region: Secondary | ICD-10-CM | POA: Diagnosis not present

## 2016-10-15 ENCOUNTER — Encounter (INDEPENDENT_AMBULATORY_CARE_PROVIDER_SITE_OTHER): Payer: Medicare Other | Admitting: Ophthalmology

## 2016-10-15 DIAGNOSIS — E113391 Type 2 diabetes mellitus with moderate nonproliferative diabetic retinopathy without macular edema, right eye: Secondary | ICD-10-CM

## 2016-10-15 DIAGNOSIS — H35033 Hypertensive retinopathy, bilateral: Secondary | ICD-10-CM

## 2016-10-15 DIAGNOSIS — I10 Essential (primary) hypertension: Secondary | ICD-10-CM | POA: Diagnosis not present

## 2016-10-15 DIAGNOSIS — E11311 Type 2 diabetes mellitus with unspecified diabetic retinopathy with macular edema: Secondary | ICD-10-CM

## 2016-10-15 DIAGNOSIS — E113312 Type 2 diabetes mellitus with moderate nonproliferative diabetic retinopathy with macular edema, left eye: Secondary | ICD-10-CM

## 2016-10-15 DIAGNOSIS — H43813 Vitreous degeneration, bilateral: Secondary | ICD-10-CM | POA: Diagnosis not present

## 2016-10-19 DIAGNOSIS — M9903 Segmental and somatic dysfunction of lumbar region: Secondary | ICD-10-CM | POA: Diagnosis not present

## 2016-10-19 DIAGNOSIS — S76002A Unspecified injury of muscle, fascia and tendon of left hip, initial encounter: Secondary | ICD-10-CM | POA: Diagnosis not present

## 2016-10-19 DIAGNOSIS — S338XXA Sprain of other parts of lumbar spine and pelvis, initial encounter: Secondary | ICD-10-CM | POA: Diagnosis not present

## 2016-10-19 DIAGNOSIS — M47816 Spondylosis without myelopathy or radiculopathy, lumbar region: Secondary | ICD-10-CM | POA: Diagnosis not present

## 2016-10-19 DIAGNOSIS — M5442 Lumbago with sciatica, left side: Secondary | ICD-10-CM | POA: Diagnosis not present

## 2016-10-20 DIAGNOSIS — R809 Proteinuria, unspecified: Secondary | ICD-10-CM | POA: Diagnosis not present

## 2016-10-20 DIAGNOSIS — E559 Vitamin D deficiency, unspecified: Secondary | ICD-10-CM | POA: Diagnosis not present

## 2016-10-20 DIAGNOSIS — D509 Iron deficiency anemia, unspecified: Secondary | ICD-10-CM | POA: Diagnosis not present

## 2016-10-20 DIAGNOSIS — I129 Hypertensive chronic kidney disease with stage 1 through stage 4 chronic kidney disease, or unspecified chronic kidney disease: Secondary | ICD-10-CM | POA: Diagnosis not present

## 2016-10-20 DIAGNOSIS — N183 Chronic kidney disease, stage 3 (moderate): Secondary | ICD-10-CM | POA: Diagnosis not present

## 2016-10-20 DIAGNOSIS — Z79899 Other long term (current) drug therapy: Secondary | ICD-10-CM | POA: Diagnosis not present

## 2016-10-29 DIAGNOSIS — E7800 Pure hypercholesterolemia, unspecified: Secondary | ICD-10-CM | POA: Diagnosis not present

## 2016-10-29 DIAGNOSIS — E119 Type 2 diabetes mellitus without complications: Secondary | ICD-10-CM | POA: Diagnosis not present

## 2016-10-29 DIAGNOSIS — I10 Essential (primary) hypertension: Secondary | ICD-10-CM | POA: Diagnosis not present

## 2016-10-29 DIAGNOSIS — I2510 Atherosclerotic heart disease of native coronary artery without angina pectoris: Secondary | ICD-10-CM | POA: Diagnosis not present

## 2016-10-30 ENCOUNTER — Encounter (HOSPITAL_COMMUNITY): Payer: Medicare Other | Attending: Oncology

## 2016-10-30 DIAGNOSIS — Z880 Allergy status to penicillin: Secondary | ICD-10-CM | POA: Insufficient documentation

## 2016-10-30 DIAGNOSIS — Z6838 Body mass index (BMI) 38.0-38.9, adult: Secondary | ICD-10-CM | POA: Diagnosis not present

## 2016-10-30 DIAGNOSIS — N184 Chronic kidney disease, stage 4 (severe): Secondary | ICD-10-CM | POA: Diagnosis not present

## 2016-10-30 DIAGNOSIS — N2589 Other disorders resulting from impaired renal tubular function: Secondary | ICD-10-CM | POA: Insufficient documentation

## 2016-10-30 DIAGNOSIS — F319 Bipolar disorder, unspecified: Secondary | ICD-10-CM | POA: Insufficient documentation

## 2016-10-30 DIAGNOSIS — E782 Mixed hyperlipidemia: Secondary | ICD-10-CM | POA: Diagnosis not present

## 2016-10-30 DIAGNOSIS — Z79899 Other long term (current) drug therapy: Secondary | ICD-10-CM | POA: Diagnosis not present

## 2016-10-30 DIAGNOSIS — Z794 Long term (current) use of insulin: Secondary | ICD-10-CM | POA: Diagnosis not present

## 2016-10-30 DIAGNOSIS — Z881 Allergy status to other antibiotic agents status: Secondary | ICD-10-CM | POA: Diagnosis not present

## 2016-10-30 DIAGNOSIS — Z7982 Long term (current) use of aspirin: Secondary | ICD-10-CM | POA: Insufficient documentation

## 2016-10-30 DIAGNOSIS — E669 Obesity, unspecified: Secondary | ICD-10-CM | POA: Diagnosis not present

## 2016-10-30 DIAGNOSIS — I2510 Atherosclerotic heart disease of native coronary artery without angina pectoris: Secondary | ICD-10-CM | POA: Insufficient documentation

## 2016-10-30 DIAGNOSIS — G4733 Obstructive sleep apnea (adult) (pediatric): Secondary | ICD-10-CM | POA: Diagnosis not present

## 2016-10-30 DIAGNOSIS — E1122 Type 2 diabetes mellitus with diabetic chronic kidney disease: Secondary | ICD-10-CM | POA: Insufficient documentation

## 2016-10-30 DIAGNOSIS — D649 Anemia, unspecified: Secondary | ICD-10-CM | POA: Diagnosis not present

## 2016-10-30 DIAGNOSIS — D472 Monoclonal gammopathy: Secondary | ICD-10-CM | POA: Insufficient documentation

## 2016-10-30 DIAGNOSIS — I129 Hypertensive chronic kidney disease with stage 1 through stage 4 chronic kidney disease, or unspecified chronic kidney disease: Secondary | ICD-10-CM | POA: Insufficient documentation

## 2016-10-30 DIAGNOSIS — Z87891 Personal history of nicotine dependence: Secondary | ICD-10-CM | POA: Insufficient documentation

## 2016-10-30 DIAGNOSIS — I471 Supraventricular tachycardia: Secondary | ICD-10-CM | POA: Insufficient documentation

## 2016-10-30 LAB — COMPREHENSIVE METABOLIC PANEL
ALK PHOS: 76 U/L (ref 38–126)
ALT: 21 U/L (ref 14–54)
AST: 19 U/L (ref 15–41)
Albumin: 4.1 g/dL (ref 3.5–5.0)
Anion gap: 10 (ref 5–15)
BUN: 46 mg/dL — AB (ref 6–20)
CO2: 27 mmol/L (ref 22–32)
CREATININE: 1.53 mg/dL — AB (ref 0.44–1.00)
Calcium: 9.4 mg/dL (ref 8.9–10.3)
Chloride: 100 mmol/L — ABNORMAL LOW (ref 101–111)
GFR, EST AFRICAN AMERICAN: 39 mL/min — AB (ref >60)
GFR, EST NON AFRICAN AMERICAN: 34 mL/min — AB (ref >60)
Glucose, Bld: 158 mg/dL — ABNORMAL HIGH (ref 65–99)
Potassium: 5.4 mmol/L — ABNORMAL HIGH (ref 3.5–5.1)
Sodium: 137 mmol/L (ref 135–145)
Total Bilirubin: 0.5 mg/dL (ref 0.3–1.2)
Total Protein: 7.4 g/dL (ref 6.5–8.1)

## 2016-10-30 LAB — CBC WITH DIFFERENTIAL/PLATELET
Basophils Absolute: 0 10*3/uL (ref 0.0–0.1)
Basophils Relative: 0 %
Eosinophils Absolute: 0.5 10*3/uL (ref 0.0–0.7)
Eosinophils Relative: 6 %
HEMATOCRIT: 34.7 % — AB (ref 36.0–46.0)
HEMOGLOBIN: 11.2 g/dL — AB (ref 12.0–15.0)
Lymphocytes Relative: 24 %
Lymphs Abs: 1.9 10*3/uL (ref 0.7–4.0)
MCH: 31.1 pg (ref 26.0–34.0)
MCHC: 32.3 g/dL (ref 30.0–36.0)
MCV: 96.4 fL (ref 78.0–100.0)
Monocytes Absolute: 0.5 10*3/uL (ref 0.1–1.0)
Monocytes Relative: 6 %
NEUTROS ABS: 4.9 10*3/uL (ref 1.7–7.7)
Neutrophils Relative %: 64 %
Platelets: 167 10*3/uL (ref 150–400)
RBC: 3.6 MIL/uL — AB (ref 3.87–5.11)
RDW: 13 % (ref 11.5–15.5)
WBC: 7.7 10*3/uL (ref 4.0–10.5)

## 2016-10-30 LAB — IRON AND TIBC
IRON: 78 ug/dL (ref 28–170)
SATURATION RATIOS: 27 % (ref 10.4–31.8)
TIBC: 294 ug/dL (ref 250–450)
UIBC: 216 ug/dL

## 2016-10-30 LAB — VITAMIN B12: Vitamin B-12: 838 pg/mL (ref 180–914)

## 2016-10-30 LAB — FERRITIN: Ferritin: 45 ng/mL (ref 11–307)

## 2016-10-31 LAB — FOLATE: FOLATE: 52.8 ng/mL (ref >5.9)

## 2016-11-02 DIAGNOSIS — M5442 Lumbago with sciatica, left side: Secondary | ICD-10-CM | POA: Diagnosis not present

## 2016-11-02 DIAGNOSIS — M47816 Spondylosis without myelopathy or radiculopathy, lumbar region: Secondary | ICD-10-CM | POA: Diagnosis not present

## 2016-11-02 DIAGNOSIS — S338XXA Sprain of other parts of lumbar spine and pelvis, initial encounter: Secondary | ICD-10-CM | POA: Diagnosis not present

## 2016-11-02 DIAGNOSIS — S76002A Unspecified injury of muscle, fascia and tendon of left hip, initial encounter: Secondary | ICD-10-CM | POA: Diagnosis not present

## 2016-11-02 DIAGNOSIS — M9903 Segmental and somatic dysfunction of lumbar region: Secondary | ICD-10-CM | POA: Diagnosis not present

## 2016-11-02 LAB — PROTEIN ELECTROPHORESIS, SERUM
A/G Ratio: 1 (ref 0.7–1.7)
ALBUMIN ELP: 3.5 g/dL (ref 2.9–4.4)
Alpha-1-Globulin: 0.2 g/dL (ref 0.0–0.4)
Alpha-2-Globulin: 0.6 g/dL (ref 0.4–1.0)
BETA GLOBULIN: 1.1 g/dL (ref 0.7–1.3)
GAMMA GLOBULIN: 1.6 g/dL (ref 0.4–1.8)
Globulin, Total: 3.5 g/dL (ref 2.2–3.9)
M-SPIKE, %: 0.6 g/dL — AB
TOTAL PROTEIN ELP: 7 g/dL (ref 6.0–8.5)

## 2016-11-02 LAB — KAPPA/LAMBDA LIGHT CHAINS
KAPPA FREE LGHT CHN: 45.7 mg/L — AB (ref 3.3–19.4)
KAPPA, LAMDA LIGHT CHAIN RATIO: 0.97 (ref 0.26–1.65)
LAMDA FREE LIGHT CHAINS: 47.3 mg/L — AB (ref 5.7–26.3)

## 2016-11-03 DIAGNOSIS — I10 Essential (primary) hypertension: Secondary | ICD-10-CM | POA: Diagnosis not present

## 2016-11-03 DIAGNOSIS — E1129 Type 2 diabetes mellitus with other diabetic kidney complication: Secondary | ICD-10-CM | POA: Diagnosis not present

## 2016-11-03 DIAGNOSIS — R809 Proteinuria, unspecified: Secondary | ICD-10-CM | POA: Diagnosis not present

## 2016-11-03 DIAGNOSIS — N183 Chronic kidney disease, stage 3 (moderate): Secondary | ICD-10-CM | POA: Diagnosis not present

## 2016-11-06 ENCOUNTER — Encounter (HOSPITAL_BASED_OUTPATIENT_CLINIC_OR_DEPARTMENT_OTHER): Payer: Medicare Other | Admitting: Adult Health

## 2016-11-06 ENCOUNTER — Encounter (HOSPITAL_COMMUNITY): Payer: Self-pay | Admitting: Adult Health

## 2016-11-06 VITALS — BP 145/52 | HR 69 | Resp 18 | Ht 63.0 in | Wt 218.4 lb

## 2016-11-06 DIAGNOSIS — D649 Anemia, unspecified: Secondary | ICD-10-CM

## 2016-11-06 DIAGNOSIS — D472 Monoclonal gammopathy: Secondary | ICD-10-CM

## 2016-11-06 DIAGNOSIS — N189 Chronic kidney disease, unspecified: Secondary | ICD-10-CM | POA: Diagnosis not present

## 2016-11-06 NOTE — Progress Notes (Signed)
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 Laverle Patter note on 05/07/16)  Christy [name] is a wonderful 66 y.o. female who has been referred to [place] by Dr .Monico Blitz, MD  for evaluation and continued management of her IgG kappa MGUS.  Patient has a history of hypertension, dyslipidemia, coronary artery disease status post PCI, obstructive sleep apnea on CPAP, diabetes type 2, CKD stage 4, RTA type IV and bipolar disorder who was noted to have an elevation in her creatinine and was seen by nephrology in November 2014 and had a SPEP done as a part of the workup for her kidney disease and was noted to have an M spike of 0.6 with normal Lambda Light Chain Ratio. Immunofixation showed an IgG kappa monoclonal protein. Patient had hepatitis C antibody which was negative, ANA negative, C ANCA negative, p-ANCA negative, anti-double-stranded DNA antibody negative and complements were within normal limits. Her creatinine at that time was 1.32. Patient reports that increase potassium levels were concerned that initially triggered her nephrology evaluation. Patient had a hemoglobin of 11.8 at the time and no significant protein in the urine. Patient reports she got a bone scan which was noted to be negative. She was not recommended a bone marrow examination based on no other acute concerns regarding multiple myeloma.  Patient was being followed with Dr Tressie Stalker and was last seen in 01/2015 when her M-spike was 0.7 and did not show any significant increase. Labs in July 2017 showed a hemoglobin of 10.4 and a urine total protein creatinine ratio 0.2. Creatinine has been gradually increasing over the last couple  of years and is now up to 2 with BUN of 60 suggesting some element of dehydration. 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 She has had history of some hyperkalemia thought to be related to type IV RTA from her diabetes and possibly from Ace inhibitors which were discontinued.    INTERVAL HISTORY:  Christy [name] 66 y.o. female returns for routine follow-up for MGUS.    Overall, she tells me she has been feeling well.  Appetite 100%; energy levels 75%.  Endorses easy bleeding/bruising, particularly to her forearms. Denies any frank bleeding episodes including blood in her stools, dark/tarry stools, hematuria, vaginal bleeding, or nosebleeds.  Denies any focal bone pain.  Denies any changes in her urinary or bowel habits.    Continues to see nephrology for CKD.  Her PCP is Dr. Manuella Ghazi in [place]; she sees him regularly.  States that she has already received her flu vaccine for this season.  She also sees a retina specialist regularly as well.    Otherwise, she is largely without complaints.  She would like to know "if the protein in my blood is still there or can I be released from coming back for follow-up visits if it's okay?"    REVIEW OF SYSTEMS:  Review of Systems  Constitutional: Negative.   HENT:  Negative.  Negative for nosebleeds.   Eyes: Positive for eye problems.  Respiratory: Negative.   Cardiovascular: Negative.   Gastrointestinal: Negative.  Negative for abdominal pain, blood in stool, constipation,  diarrhea, nausea and vomiting.  Endocrine: Negative.   Genitourinary: Negative.  Negative for dysuria, hematuria and vaginal bleeding.   Musculoskeletal: Negative.  Negative for arthralgias, back pain and neck pain.  Skin: Negative.  Negative for rash.  Neurological: Negative.   Hematological: Bruises/bleeds easily.     PAST MEDICAL/SURGICAL HISTORY:  Past Medical History:  Diagnosis Date  . Bipolar  disorder ([place])   . Coronary atherosclerosis of native coronary artery    BMS circ 2006, 70% PDA 2009  . Essential hypertension, benign   . Mixed hyperlipidemia   . OSA on CPAP   . Renal tubular acidosis, type IV   . SVT (supraventricular tachycardia) ([place])   . Type 2 diabetes mellitus ([place])    Past Surgical History:  Procedure Laterality Date  . Acromioclavicular arthritis    . CARDIAC CATHETERIZATION N/A 03/29/2015   Procedure: Left Heart Cath and Coronary Angiography;  Surgeon: Troy Sine, MD;  Location: [facility];  Service: Cardiovascular;  Laterality: N/A;  . RIght shoulder adhesive capsulitis    . Rotator cuff impingement syndrome    . Rotator cuff tear       SOCIAL HISTORY:  Social History   Social History  . Marital status: Divorced    Spouse name: N/A  . Number of children: N/A  . Years of education: N/A   Occupational History  . [employer] History Main Topics  . Smoking status: Former Smoker    Packs/day: 1.00    Years: 20.00    Types: Cigarettes    Start date: 10/30/1984    Quit date: 07/08/2013  . Smokeless tobacco: Never Used  . Alcohol use No  . Drug use: No  . Sexual activity: Not on file   Other Topics Concern  . Not on file   Social History Narrative   She has lived with some of her daughters. Works part-time for the city.     FAMILY HISTORY:  Family History  Problem Relation Age of Onset  . Bipolar disorder Brother   . Diabetes Father     CURRENT MEDICATIONS:  Outpatient Encounter Prescriptions as of 11/06/2016  Medication Sig  . allopurinol (ZYLOPRIM) 300 MG tablet Take 150 mg by mouth daily.  Marland Kitchen aspirin 81 MG EC tablet Take 81 mg by mouth daily.    Marland Kitchen buPROPion (WELLBUTRIN XL) 300 MG 24 hr tablet Take 300 mg by mouth daily.    . calcium carbonate (OS-CAL) 600 MG TABS Take 600 mg by mouth daily.    . Cholecalciferol (VITAMIN D) 400 UNITS capsule Take 400 Units by mouth daily.    .  divalproex (DEPAKOTE) 250 MG EC tablet Take 250 mg by mouth daily.    . isosorbide mononitrate (IMDUR) 60 MG 24 hr tablet Take 1.5 tablets (90 mg total) by mouth daily.  . Liraglutide (VICTOZA) 18 MG/3ML SOLN Inject 1.2 mg into the skin daily.   Marland Kitchen losartan (COZAAR) 25 MG tablet Take 25 mg by mouth daily.  . Multiple Vitamin (MULTIVITAMIN) tablet Take 1 tablet by mouth daily.    . nitroGLYCERIN (NITROLINGUAL) 0.4 MG/SPRAY spray Place 1 spray under the tongue every 5 (five) minutes x 3 doses as needed.  . pantoprazole (PROTONIX) 40 MG tablet Take 40 mg by mouth every other day.   Marland Kitchen POLYETHYLENE GLYCOL 3350 PO Take by mouth daily.  . rosuvastatin (CRESTOR) 10 MG tablet Take 10 mg by mouth daily.  . sertraline (ZOLOFT) 100 MG  tablet Take 100 mg by mouth daily.    Marland Kitchen torsemide (DEMADEX) 20 MG tablet Take 2 tablets (40 mg total) by mouth every morning.  . traZODone (DESYREL) 50 MG tablet Take 50 mg by mouth daily.    . TRESIBA FLEXTOUCH 200 UNIT/ML SOPN Inject 38 Units into the skin every morning.   No facility-administered encounter medications on file as of 11/06/2016.     ALLERGIES:  Allergies  Allergen Reactions  . Ace Inhibitors   . Lisinopril     Severe cough  . Penicillins   . Tetracycline   . Lamictal [Lamotrigine] Rash    Rash start inside to outter body.     PHYSICAL EXAM:  ECOG Performance status: 0 - Asymptomatic   Vitals:   11/06/16 1352  BP: (!) 145/52  Pulse: 69  Resp: 18  SpO2: 96%   Filed Weights   11/06/16 1352  Weight: 218 lb 6.4 oz (99.1 kg)    Physical Exam  Constitutional: She is oriented to person, place, and time and well-developed, well-nourished, and in no distress.  HENT:  Head: Normocephalic.  Mouth/Throat: Oropharynx is clear and moist. No oropharyngeal exudate.  Eyes: Pupils are equal, round, and reactive to light. Conjunctivae are normal. No scleral icterus.  Neck: Normal range of motion. Neck supple.  Cardiovascular: Normal rate, regular  rhythm and normal heart sounds.   Pulmonary/Chest: Effort normal and breath sounds normal. No respiratory distress.  Abdominal: Soft. Bowel sounds are normal. There is no tenderness.  Musculoskeletal: Normal range of motion. She exhibits no edema.  Lymphadenopathy:    She has no cervical adenopathy.       Right: No supraclavicular adenopathy present.       Left: No supraclavicular adenopathy present.  Neurological: She is alert and oriented to person, place, and time. No cranial nerve deficit. Gait normal.  Skin: Skin is warm and dry. No rash noted.  Mild ecchymosis to bilateral forearms.   Psychiatric: Mood, memory, affect and judgment normal.  Nursing note and vitals reviewed.    LABORATORY DATA:  I have reviewed the labs as listed.  CBC    Component Value Date/Time   WBC 7.7 10/30/2016 1343   RBC 3.60 (L) 10/30/2016 1343   HGB 11.2 (L) 10/30/2016 1343   HCT 34.7 (L) 10/30/2016 1343   PLT 167 10/30/2016 1343   MCV 96.4 10/30/2016 1343   MCH 31.1 10/30/2016 1343   MCHC 32.3 10/30/2016 1343   RDW 13.0 10/30/2016 1343   LYMPHSABS 1.9 10/30/2016 1343   MONOABS 0.5 10/30/2016 1343   EOSABS 0.5 10/30/2016 1343   BASOSABS 0.0 10/30/2016 1343   CMP Latest Ref Rng & Units 10/30/2016 05/07/2016 09/25/2015  Glucose 65 - 99 mg/dL 158(H) 210(H) 144(H)  BUN 6 - 20 mg/dL 46(H) 45(H) 46(H)  Creatinine 0.44 - 1.00 mg/dL 1.53(H) 1.47(H) 1.45(H)  Sodium 135 - 145 mmol/L 137 137 139  Potassium 3.5 - 5.1 mmol/L 5.4(H) 5.1 5.1  Chloride 101 - 111 mmol/L 100(L) 105 107  CO2 22 - 32 mmol/L '27 26 26  '$ Calcium 8.9 - 10.3 mg/dL 9.4 8.7(L) 9.2  Total Protein 6.5 - 8.1 g/dL 7.4 6.9 7.2  Total Bilirubin 0.3 - 1.2 mg/dL 0.5 0.4 0.4  Alkaline Phos 38 - 126 U/L 76 74 72  AST 15 - 41 U/L '19 23 17  '$ ALT 14 - 54 U/L '21 23 15   '$ Results for Christy [name], Christy [name] (MRN [id number])   Ref. Range 10/30/2016 13:44  Iron Latest Ref  Range: 28 - 170 ug/dL 78  UIBC Latest Units: ug/dL 216  TIBC Latest Ref Range: 250 -  450 ug/dL 294  Saturation Ratios Latest Ref Range: 10.4 - 31.8 % 27  Ferritin Latest Ref Range: 11 - 307 ng/mL 45  Folate Latest Ref Range: >5.9 ng/mL 52.8  Vitamin B12 Latest Ref Range: 180 - 914 pg/mL 838  Results for Christy [name], Christy [name] (MRN [id number])  Ref. Range 10/30/2016 13:44  Globulin, Total Latest Ref Range: 2.2 - 3.9 g/dL 3.5  A/G Ratio Latest Ref Range: 0.7 - 1.7  1.0  Alpha-1-Globulin Latest Ref Range: 0.0 - 0.4 g/dL 0.2  Alpha-2-Globulin Latest Ref Range: 0.4 - 1.0 g/dL 0.6  Beta Globulin Latest Ref Range: 0.7 - 1.3 g/dL 1.1  Gamma Globulin Latest Ref Range: 0.4 - 1.8 g/dL 1.6  M-SPIKE, % Latest Ref Range: Not Observed g/dL 0.6 (H)     Results for Christy [name], Christy [name] (MRN [id number]) as of 11/06/2016 14:20  Ref. Range 10/30/2016 13:43  Kappa free light chain Latest Ref Range: 3.3 - 19.4 mg/L 45.7 (H)  Lamda free light chains Latest Ref Range: 5.7 - 26.3 mg/L 47.3 (H)  Kappa, lamda light chain ratio Latest Ref Range: 0.26 - 1.65  0.97   Results for Christy [name], Christy [name] (MRN [id number])   Ref. 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 M-spike stable at 0.6 on 10/30/16. Calcium normal at 9.4. Her kidney function is stable with elevated BUN/CRE 46/1.53; GFR 34; noted stability since at least 09/2015.  Kappa/lambda light chain ratio is normal at 0.97.  Anemia is stable with Hgb 11.2 g/dL.  Available labs reviewed with patient in detail today; results are included in this documentation.  Denies any new/worsening focal bone pain.   -Discussed with Dr. Talbert Cage and shared with patient that she has an approximately 1% risk per year for MGUS to progress to multiple myeloma. If M-spike and kappa/lambda light chain ratios increase and/or  worsening "CRAB" symptoms, then would proceed with additional work-up with multiple myeloma including bone marrow biopsy (which she has never had) and PET scan.   -Given that her M-spike remains detectable, but stable, we recommend continued monitoring every 6 months with labs and follow-up.  Patient agrees with this plan.   -Return to cancer center in 6 months with labs 1 week prior, so that results will be available to review at time of office visit.  Of course, if she has any new/concerning symptoms or lab changes.   Anemia:  -Hgb stable at 11.2 g/dL.  Iron studies reviewed and are normal; ferritin 45, iron sat 27%, TIBC normal.  Vitamin B12 and folate also normal.  -Likely secondary to CKD. Anemia has not progressed since last visit; will keep monitoring.   Health maintenance/Wellness promotion:  -She is reportedly up-to-date with her mammogram, colonoscopy, and vaccinations. She reports having received her annual flu vaccine for this season.   -Encouraged continued follow-up with PCP and other specialists as directed.       Dispo:  -Return to cancer center in 6 months with labs 1 week prior.    All questions were answered to patient's stated satisfaction. Encouraged patient to call with any new concerns or questions  before her next visit to the cancer center and we can certain see her sooner, if needed.    Plan of care discussed with Dr. Talbert Cage, who agrees with the above aforementioned.    Orders placed this encounter:  Orders Placed This Encounter  Procedures  . CBC with Differential/Platelet  . Comprehensive metabolic panel  . IgG, IgA, IgM  . Immunofixation electrophoresis  . Kappa/lambda light chains  . Protein electrophoresis, serum      Mike Craze, NP [place] [phone]

## 2016-11-06 NOTE — Patient Instructions (Signed)
[facility] at [facility] Discharge Instructions  RECOMMENDATIONS MADE BY THE CONSULTANT AND ANY TEST RESULTS WILL BE SENT TO YOUR REFERRING PHYSICIAN.  You were seen today by Gretchen Dawson NP.   Thank you for choosing [facility] at [facility] to provide your oncology and hematology care.  To afford each patient quality time with our provider, please arrive at least 15 minutes before your scheduled appointment time.    If you have a lab appointment with the Cancer Center please come in thru the  Main Entrance and check in at the main information desk  You need to re-schedule your appointment should you arrive 10 or more minutes late.  We strive to give you quality time with our providers, and arriving late affects you and other patients whose appointments are after yours.  Also, if you no show three or more times for appointments you may be dismissed from the clinic at the providers discretion.     Again, thank you for choosing [facility].  Our hope is that these requests will decrease the amount of time that you wait before being seen by our physicians.       _____________________________________________________________  Should you have questions after your visit to [facility], please contact our office at [phone] between the hours of 8:30 a.m. and 4:30 p.m.  Voicemails left after 4:30 p.m. will not be returned until the following business day.  For prescription refill requests, have your pharmacy contact our office.       Resources For Cancer Patients and their Caregivers ? American Cancer Society: Can assist with transportation, wigs, general needs, runs Look Good Feel Better.        1-888-227-6333 ? Cancer Care: Provides financial assistance, online support groups, medication/co-pay assistance.  1-800-813-HOPE (4673) ? [facility] Assists [place] cancer patients and  their families through emotional , educational and financial support.  336-427-4357 ? [place] DSS Where to apply for food stamps, Medicaid and utility assistance. 336-342-1394 ? [facility]: Transportation to medical appointments. 336-347-2287 ? Social Security Administration: May apply for disability if have a Stage IV cancer. 336-342-7796 1-800-772-1213 ? [place] Aging, Disability and Transit Services: Assists with nutrition, care and transit needs. 336-349-2343  Cancer Center Support Programs: @10RELATIVEDAYS@ > Cancer Support Group  2nd Tuesday of the month 1pm-2pm, Journey Room  > Creative Journey  3rd Tuesday of the month 1130am-1pm, Journey Room  > Look Good Feel Better  1st Wednesday of the month 10am-12 noon, Journey Room (Call American Cancer Society to register 1-800-395-5775)    

## 2016-11-18 DIAGNOSIS — F411 Generalized anxiety disorder: Secondary | ICD-10-CM | POA: Diagnosis not present

## 2016-11-18 DIAGNOSIS — F3112 Bipolar disorder, current episode manic without psychotic features, moderate: Secondary | ICD-10-CM | POA: Diagnosis not present

## 2016-11-18 DIAGNOSIS — F4310 Post-traumatic stress disorder, unspecified: Secondary | ICD-10-CM | POA: Diagnosis not present

## 2016-11-19 DIAGNOSIS — I2510 Atherosclerotic heart disease of native coronary artery without angina pectoris: Secondary | ICD-10-CM | POA: Diagnosis not present

## 2016-11-19 DIAGNOSIS — I10 Essential (primary) hypertension: Secondary | ICD-10-CM | POA: Diagnosis not present

## 2016-11-19 DIAGNOSIS — E119 Type 2 diabetes mellitus without complications: Secondary | ICD-10-CM | POA: Diagnosis not present

## 2016-11-19 DIAGNOSIS — E7800 Pure hypercholesterolemia, unspecified: Secondary | ICD-10-CM | POA: Diagnosis not present

## 2016-11-30 DIAGNOSIS — M47816 Spondylosis without myelopathy or radiculopathy, lumbar region: Secondary | ICD-10-CM | POA: Diagnosis not present

## 2016-11-30 DIAGNOSIS — M9903 Segmental and somatic dysfunction of lumbar region: Secondary | ICD-10-CM | POA: Diagnosis not present

## 2016-11-30 DIAGNOSIS — S338XXA Sprain of other parts of lumbar spine and pelvis, initial encounter: Secondary | ICD-10-CM | POA: Diagnosis not present

## 2016-11-30 DIAGNOSIS — S76002A Unspecified injury of muscle, fascia and tendon of left hip, initial encounter: Secondary | ICD-10-CM | POA: Diagnosis not present

## 2016-11-30 DIAGNOSIS — M5442 Lumbago with sciatica, left side: Secondary | ICD-10-CM | POA: Diagnosis not present

## 2016-12-17 ENCOUNTER — Other Ambulatory Visit: Payer: Self-pay | Admitting: Cardiology

## 2016-12-17 MED ORDER — TORSEMIDE 20 MG PO TABS: 40.0000 mg | ORAL_TABLET | ORAL | 0 refills | Status: DC

## 2016-12-17 NOTE — Telephone Encounter (Signed)
°*  STAT* If patient is at the pharmacy, call can be transferred to refill team.   1. Which medications need to be refilled? (please list name of each medication and dose if known) torsemide (DEMADEX) 20 MG tablet   2. Which pharmacy/location (including street and city if local pharmacy) is medication to be sent to? [facility]   3. Do they need a 30 day or 90 day supply? [place]

## 2016-12-17 NOTE — Telephone Encounter (Signed)
Medication sent to pharmacy  

## 2016-12-29 DIAGNOSIS — E7800 Pure hypercholesterolemia, unspecified: Secondary | ICD-10-CM | POA: Diagnosis not present

## 2016-12-29 DIAGNOSIS — I2510 Atherosclerotic heart disease of native coronary artery without angina pectoris: Secondary | ICD-10-CM | POA: Diagnosis not present

## 2016-12-29 DIAGNOSIS — I10 Essential (primary) hypertension: Secondary | ICD-10-CM | POA: Diagnosis not present

## 2016-12-29 DIAGNOSIS — E119 Type 2 diabetes mellitus without complications: Secondary | ICD-10-CM | POA: Diagnosis not present

## 2017-01-04 DIAGNOSIS — S338XXA Sprain of other parts of lumbar spine and pelvis, initial encounter: Secondary | ICD-10-CM | POA: Diagnosis not present

## 2017-01-04 DIAGNOSIS — M47816 Spondylosis without myelopathy or radiculopathy, lumbar region: Secondary | ICD-10-CM | POA: Diagnosis not present

## 2017-01-04 DIAGNOSIS — M5442 Lumbago with sciatica, left side: Secondary | ICD-10-CM | POA: Diagnosis not present

## 2017-01-04 DIAGNOSIS — S76002A Unspecified injury of muscle, fascia and tendon of left hip, initial encounter: Secondary | ICD-10-CM | POA: Diagnosis not present

## 2017-01-04 DIAGNOSIS — M9903 Segmental and somatic dysfunction of lumbar region: Secondary | ICD-10-CM | POA: Diagnosis not present

## 2017-01-14 DIAGNOSIS — L309 Dermatitis, unspecified: Secondary | ICD-10-CM | POA: Diagnosis not present

## 2017-01-14 DIAGNOSIS — E1122 Type 2 diabetes mellitus with diabetic chronic kidney disease: Secondary | ICD-10-CM | POA: Diagnosis not present

## 2017-01-14 DIAGNOSIS — Z299 Encounter for prophylactic measures, unspecified: Secondary | ICD-10-CM | POA: Diagnosis not present

## 2017-01-14 DIAGNOSIS — Z6841 Body Mass Index (BMI) 40.0 and over, adult: Secondary | ICD-10-CM | POA: Diagnosis not present

## 2017-01-14 DIAGNOSIS — E1165 Type 2 diabetes mellitus with hyperglycemia: Secondary | ICD-10-CM | POA: Diagnosis not present

## 2017-01-14 DIAGNOSIS — Z87891 Personal history of nicotine dependence: Secondary | ICD-10-CM | POA: Diagnosis not present

## 2017-01-14 DIAGNOSIS — E7800 Pure hypercholesterolemia, unspecified: Secondary | ICD-10-CM | POA: Diagnosis not present

## 2017-01-14 DIAGNOSIS — K219 Gastro-esophageal reflux disease without esophagitis: Secondary | ICD-10-CM | POA: Diagnosis not present

## 2017-01-14 DIAGNOSIS — E1142 Type 2 diabetes mellitus with diabetic polyneuropathy: Secondary | ICD-10-CM | POA: Diagnosis not present

## 2017-01-20 DIAGNOSIS — L609 Nail disorder, unspecified: Secondary | ICD-10-CM | POA: Diagnosis not present

## 2017-01-20 DIAGNOSIS — E1140 Type 2 diabetes mellitus with diabetic neuropathy, unspecified: Secondary | ICD-10-CM | POA: Diagnosis not present

## 2017-01-20 DIAGNOSIS — L110 Acquired keratosis follicularis: Secondary | ICD-10-CM | POA: Diagnosis not present

## 2017-01-28 ENCOUNTER — Encounter (INDEPENDENT_AMBULATORY_CARE_PROVIDER_SITE_OTHER): Payer: Medicare Other | Admitting: Ophthalmology

## 2017-01-28 DIAGNOSIS — I10 Essential (primary) hypertension: Secondary | ICD-10-CM

## 2017-01-28 DIAGNOSIS — E11311 Type 2 diabetes mellitus with unspecified diabetic retinopathy with macular edema: Secondary | ICD-10-CM | POA: Diagnosis not present

## 2017-01-28 DIAGNOSIS — E113312 Type 2 diabetes mellitus with moderate nonproliferative diabetic retinopathy with macular edema, left eye: Secondary | ICD-10-CM | POA: Diagnosis not present

## 2017-01-28 DIAGNOSIS — H35033 Hypertensive retinopathy, bilateral: Secondary | ICD-10-CM | POA: Diagnosis not present

## 2017-01-28 DIAGNOSIS — H43813 Vitreous degeneration, bilateral: Secondary | ICD-10-CM | POA: Diagnosis not present

## 2017-01-28 DIAGNOSIS — E113391 Type 2 diabetes mellitus with moderate nonproliferative diabetic retinopathy without macular edema, right eye: Secondary | ICD-10-CM

## 2017-02-17 DIAGNOSIS — F411 Generalized anxiety disorder: Secondary | ICD-10-CM | POA: Diagnosis not present

## 2017-02-17 DIAGNOSIS — F3112 Bipolar disorder, current episode manic without psychotic features, moderate: Secondary | ICD-10-CM | POA: Diagnosis not present

## 2017-02-17 DIAGNOSIS — F4310 Post-traumatic stress disorder, unspecified: Secondary | ICD-10-CM | POA: Diagnosis not present

## 2017-02-18 DIAGNOSIS — D509 Iron deficiency anemia, unspecified: Secondary | ICD-10-CM | POA: Diagnosis not present

## 2017-02-18 DIAGNOSIS — I129 Hypertensive chronic kidney disease with stage 1 through stage 4 chronic kidney disease, or unspecified chronic kidney disease: Secondary | ICD-10-CM | POA: Diagnosis not present

## 2017-02-18 DIAGNOSIS — R809 Proteinuria, unspecified: Secondary | ICD-10-CM | POA: Diagnosis not present

## 2017-02-18 DIAGNOSIS — E559 Vitamin D deficiency, unspecified: Secondary | ICD-10-CM | POA: Diagnosis not present

## 2017-02-18 DIAGNOSIS — N183 Chronic kidney disease, stage 3 (moderate): Secondary | ICD-10-CM | POA: Diagnosis not present

## 2017-02-18 DIAGNOSIS — Z79899 Other long term (current) drug therapy: Secondary | ICD-10-CM | POA: Diagnosis not present

## 2017-02-23 DIAGNOSIS — G4733 Obstructive sleep apnea (adult) (pediatric): Secondary | ICD-10-CM | POA: Diagnosis not present

## 2017-02-23 DIAGNOSIS — E1129 Type 2 diabetes mellitus with other diabetic kidney complication: Secondary | ICD-10-CM | POA: Diagnosis not present

## 2017-02-23 DIAGNOSIS — D638 Anemia in other chronic diseases classified elsewhere: Secondary | ICD-10-CM | POA: Diagnosis not present

## 2017-02-23 DIAGNOSIS — I509 Heart failure, unspecified: Secondary | ICD-10-CM | POA: Diagnosis not present

## 2017-02-23 DIAGNOSIS — N183 Chronic kidney disease, stage 3 (moderate): Secondary | ICD-10-CM | POA: Diagnosis not present

## 2017-02-23 DIAGNOSIS — I10 Essential (primary) hypertension: Secondary | ICD-10-CM | POA: Diagnosis not present

## 2017-02-23 DIAGNOSIS — R801 Persistent proteinuria, unspecified: Secondary | ICD-10-CM | POA: Diagnosis not present

## 2017-03-04 DIAGNOSIS — E875 Hyperkalemia: Secondary | ICD-10-CM | POA: Diagnosis not present

## 2017-03-25 DIAGNOSIS — Z79899 Other long term (current) drug therapy: Secondary | ICD-10-CM | POA: Diagnosis not present

## 2017-03-25 DIAGNOSIS — E1165 Type 2 diabetes mellitus with hyperglycemia: Secondary | ICD-10-CM | POA: Diagnosis not present

## 2017-03-25 DIAGNOSIS — Z1331 Encounter for screening for depression: Secondary | ICD-10-CM | POA: Diagnosis not present

## 2017-03-25 DIAGNOSIS — Z1339 Encounter for screening examination for other mental health and behavioral disorders: Secondary | ICD-10-CM | POA: Diagnosis not present

## 2017-03-25 DIAGNOSIS — Z7189 Other specified counseling: Secondary | ICD-10-CM | POA: Diagnosis not present

## 2017-03-25 DIAGNOSIS — I10 Essential (primary) hypertension: Secondary | ICD-10-CM | POA: Diagnosis not present

## 2017-03-25 DIAGNOSIS — R5383 Other fatigue: Secondary | ICD-10-CM | POA: Diagnosis not present

## 2017-03-25 DIAGNOSIS — Z6841 Body Mass Index (BMI) 40.0 and over, adult: Secondary | ICD-10-CM | POA: Diagnosis not present

## 2017-03-25 DIAGNOSIS — E559 Vitamin D deficiency, unspecified: Secondary | ICD-10-CM | POA: Diagnosis not present

## 2017-03-25 DIAGNOSIS — Z0000 Encounter for general adult medical examination without abnormal findings: Secondary | ICD-10-CM | POA: Diagnosis not present

## 2017-03-25 DIAGNOSIS — Z299 Encounter for prophylactic measures, unspecified: Secondary | ICD-10-CM | POA: Diagnosis not present

## 2017-03-25 DIAGNOSIS — E1142 Type 2 diabetes mellitus with diabetic polyneuropathy: Secondary | ICD-10-CM | POA: Diagnosis not present

## 2017-03-25 DIAGNOSIS — Z1211 Encounter for screening for malignant neoplasm of colon: Secondary | ICD-10-CM | POA: Diagnosis not present

## 2017-03-30 DIAGNOSIS — L988 Other specified disorders of the skin and subcutaneous tissue: Secondary | ICD-10-CM | POA: Diagnosis not present

## 2017-03-30 DIAGNOSIS — Z6841 Body Mass Index (BMI) 40.0 and over, adult: Secondary | ICD-10-CM | POA: Diagnosis not present

## 2017-03-30 DIAGNOSIS — I10 Essential (primary) hypertension: Secondary | ICD-10-CM | POA: Diagnosis not present

## 2017-03-30 DIAGNOSIS — Z87891 Personal history of nicotine dependence: Secondary | ICD-10-CM | POA: Diagnosis not present

## 2017-03-30 DIAGNOSIS — Z299 Encounter for prophylactic measures, unspecified: Secondary | ICD-10-CM | POA: Diagnosis not present

## 2017-04-08 DIAGNOSIS — L110 Acquired keratosis follicularis: Secondary | ICD-10-CM | POA: Diagnosis not present

## 2017-04-08 DIAGNOSIS — L609 Nail disorder, unspecified: Secondary | ICD-10-CM | POA: Diagnosis not present

## 2017-04-08 DIAGNOSIS — E1140 Type 2 diabetes mellitus with diabetic neuropathy, unspecified: Secondary | ICD-10-CM | POA: Diagnosis not present

## 2017-04-12 DIAGNOSIS — L989 Disorder of the skin and subcutaneous tissue, unspecified: Secondary | ICD-10-CM | POA: Diagnosis not present

## 2017-04-12 DIAGNOSIS — L988 Other specified disorders of the skin and subcutaneous tissue: Secondary | ICD-10-CM | POA: Diagnosis not present

## 2017-04-12 DIAGNOSIS — Z299 Encounter for prophylactic measures, unspecified: Secondary | ICD-10-CM | POA: Diagnosis not present

## 2017-04-12 DIAGNOSIS — D225 Melanocytic nevi of trunk: Secondary | ICD-10-CM | POA: Diagnosis not present

## 2017-04-12 DIAGNOSIS — I10 Essential (primary) hypertension: Secondary | ICD-10-CM | POA: Diagnosis not present

## 2017-04-12 DIAGNOSIS — Z6841 Body Mass Index (BMI) 40.0 and over, adult: Secondary | ICD-10-CM | POA: Diagnosis not present

## 2017-04-19 DIAGNOSIS — E1165 Type 2 diabetes mellitus with hyperglycemia: Secondary | ICD-10-CM | POA: Diagnosis not present

## 2017-04-19 DIAGNOSIS — E1122 Type 2 diabetes mellitus with diabetic chronic kidney disease: Secondary | ICD-10-CM | POA: Diagnosis not present

## 2017-04-19 DIAGNOSIS — Z299 Encounter for prophylactic measures, unspecified: Secondary | ICD-10-CM | POA: Diagnosis not present

## 2017-04-19 DIAGNOSIS — E1142 Type 2 diabetes mellitus with diabetic polyneuropathy: Secondary | ICD-10-CM | POA: Diagnosis not present

## 2017-04-19 DIAGNOSIS — I509 Heart failure, unspecified: Secondary | ICD-10-CM | POA: Diagnosis not present

## 2017-04-19 DIAGNOSIS — I4891 Unspecified atrial fibrillation: Secondary | ICD-10-CM | POA: Diagnosis not present

## 2017-04-19 DIAGNOSIS — Z6841 Body Mass Index (BMI) 40.0 and over, adult: Secondary | ICD-10-CM | POA: Diagnosis not present

## 2017-04-19 DIAGNOSIS — N183 Chronic kidney disease, stage 3 (moderate): Secondary | ICD-10-CM | POA: Diagnosis not present

## 2017-04-29 DIAGNOSIS — E119 Type 2 diabetes mellitus without complications: Secondary | ICD-10-CM | POA: Diagnosis not present

## 2017-04-29 DIAGNOSIS — I10 Essential (primary) hypertension: Secondary | ICD-10-CM | POA: Diagnosis not present

## 2017-04-29 DIAGNOSIS — E7800 Pure hypercholesterolemia, unspecified: Secondary | ICD-10-CM | POA: Diagnosis not present

## 2017-04-29 DIAGNOSIS — I2510 Atherosclerotic heart disease of native coronary artery without angina pectoris: Secondary | ICD-10-CM | POA: Diagnosis not present

## 2017-05-03 ENCOUNTER — Inpatient Hospital Stay (HOSPITAL_COMMUNITY): Payer: Medicare Other | Attending: Hematology

## 2017-05-03 DIAGNOSIS — D472 Monoclonal gammopathy: Secondary | ICD-10-CM | POA: Insufficient documentation

## 2017-05-03 DIAGNOSIS — D649 Anemia, unspecified: Secondary | ICD-10-CM

## 2017-05-03 LAB — COMPREHENSIVE METABOLIC PANEL
ALBUMIN: 3.8 g/dL (ref 3.5–5.0)
ALK PHOS: 60 U/L (ref 38–126)
ALT: 16 U/L (ref 14–54)
AST: 19 U/L (ref 15–41)
Anion gap: 8 (ref 5–15)
BUN: 45 mg/dL — ABNORMAL HIGH (ref 6–20)
CALCIUM: 8.7 mg/dL — AB (ref 8.9–10.3)
CHLORIDE: 106 mmol/L (ref 101–111)
CO2: 25 mmol/L (ref 22–32)
CREATININE: 1.34 mg/dL — AB (ref 0.44–1.00)
GFR calc Af Amer: 46 mL/min — ABNORMAL LOW (ref >60)
GFR calc non Af Amer: 40 mL/min — ABNORMAL LOW (ref >60)
GLUCOSE: 176 mg/dL — AB (ref 65–99)
Potassium: 5.2 mmol/L — ABNORMAL HIGH (ref 3.5–5.1)
SODIUM: 139 mmol/L (ref 135–145)
Total Bilirubin: 0.5 mg/dL (ref 0.3–1.2)
Total Protein: 7.4 g/dL (ref 6.5–8.1)

## 2017-05-03 LAB — CBC WITH DIFFERENTIAL/PLATELET
BASOS ABS: 0 10*3/uL (ref 0.0–0.1)
BASOS PCT: 1 %
EOS ABS: 0.5 10*3/uL (ref 0.0–0.7)
EOS PCT: 7 %
HCT: 36.2 % (ref 36.0–46.0)
HEMOGLOBIN: 11.4 g/dL — AB (ref 12.0–15.0)
LYMPHS ABS: 1.8 10*3/uL (ref 0.7–4.0)
Lymphocytes Relative: 24 %
MCH: 31.1 pg (ref 26.0–34.0)
MCHC: 31.5 g/dL (ref 30.0–36.0)
MCV: 98.9 fL (ref 78.0–100.0)
Monocytes Absolute: 0.4 10*3/uL (ref 0.1–1.0)
Monocytes Relative: 5 %
NEUTROS PCT: 63 %
Neutro Abs: 4.6 10*3/uL (ref 1.7–7.7)
PLATELETS: 194 10*3/uL (ref 150–400)
RBC: 3.66 MIL/uL — AB (ref 3.87–5.11)
RDW: 12.4 % (ref 11.5–15.5)
WBC: 7.3 10*3/uL (ref 4.0–10.5)

## 2017-05-04 LAB — IGG, IGA, IGM
IGM (IMMUNOGLOBULIN M), SRM: 246 mg/dL — AB (ref 26–217)
IgA: 147 mg/dL (ref 87–352)
IgG (Immunoglobin G), Serum: 1314 mg/dL (ref 700–1600)

## 2017-05-04 LAB — IMMUNOFIXATION ELECTROPHORESIS
IGG (IMMUNOGLOBIN G), SERUM: 1253 mg/dL (ref 700–1600)
IGM (IMMUNOGLOBULIN M), SRM: 236 mg/dL — AB (ref 26–217)
IgA: 150 mg/dL (ref 87–352)
TOTAL PROTEIN ELP: 6.8 g/dL (ref 6.0–8.5)

## 2017-05-04 LAB — KAPPA/LAMBDA LIGHT CHAINS
KAPPA, LAMDA LIGHT CHAIN RATIO: 1.09 (ref 0.26–1.65)
Kappa free light chain: 47.7 mg/L — ABNORMAL HIGH (ref 3.3–19.4)
Lambda free light chains: 43.9 mg/L — ABNORMAL HIGH (ref 5.7–26.3)

## 2017-05-05 LAB — PROTEIN ELECTROPHORESIS, SERUM
A/G RATIO SPE: 1.1 (ref 0.7–1.7)
ALBUMIN ELP: 3.5 g/dL (ref 2.9–4.4)
Alpha-1-Globulin: 0.2 g/dL (ref 0.0–0.4)
Alpha-2-Globulin: 0.6 g/dL (ref 0.4–1.0)
BETA GLOBULIN: 1 g/dL (ref 0.7–1.3)
GLOBULIN, TOTAL: 3.1 g/dL (ref 2.2–3.9)
Gamma Globulin: 1.3 g/dL (ref 0.4–1.8)
M-Spike, %: 0.4 g/dL — ABNORMAL HIGH
Total Protein ELP: 6.6 g/dL (ref 6.0–8.5)

## 2017-05-07 ENCOUNTER — Other Ambulatory Visit: Payer: Self-pay | Admitting: Cardiology

## 2017-05-10 ENCOUNTER — Ambulatory Visit (HOSPITAL_COMMUNITY): Payer: Medicare Other | Admitting: Internal Medicine

## 2017-05-24 ENCOUNTER — Encounter (HOSPITAL_COMMUNITY): Payer: Self-pay | Admitting: Internal Medicine

## 2017-05-24 ENCOUNTER — Other Ambulatory Visit: Payer: Self-pay

## 2017-05-24 ENCOUNTER — Inpatient Hospital Stay (HOSPITAL_COMMUNITY): Payer: Medicare Other | Attending: Hematology | Admitting: Internal Medicine

## 2017-05-24 VITALS — HR 73 | Temp 98.7°F | Resp 16 | Wt 223.4 lb

## 2017-05-24 DIAGNOSIS — G4733 Obstructive sleep apnea (adult) (pediatric): Secondary | ICD-10-CM | POA: Diagnosis not present

## 2017-05-24 DIAGNOSIS — I2510 Atherosclerotic heart disease of native coronary artery without angina pectoris: Secondary | ICD-10-CM | POA: Diagnosis not present

## 2017-05-24 DIAGNOSIS — E1122 Type 2 diabetes mellitus with diabetic chronic kidney disease: Secondary | ICD-10-CM | POA: Diagnosis not present

## 2017-05-24 DIAGNOSIS — F319 Bipolar disorder, unspecified: Secondary | ICD-10-CM | POA: Diagnosis not present

## 2017-05-24 DIAGNOSIS — Z7982 Long term (current) use of aspirin: Secondary | ICD-10-CM | POA: Diagnosis not present

## 2017-05-24 DIAGNOSIS — Z79899 Other long term (current) drug therapy: Secondary | ICD-10-CM | POA: Insufficient documentation

## 2017-05-24 DIAGNOSIS — I471 Supraventricular tachycardia: Secondary | ICD-10-CM | POA: Insufficient documentation

## 2017-05-24 DIAGNOSIS — D472 Monoclonal gammopathy: Secondary | ICD-10-CM

## 2017-05-24 DIAGNOSIS — N184 Chronic kidney disease, stage 4 (severe): Secondary | ICD-10-CM

## 2017-05-24 DIAGNOSIS — E782 Mixed hyperlipidemia: Secondary | ICD-10-CM | POA: Diagnosis not present

## 2017-05-24 DIAGNOSIS — I130 Hypertensive heart and chronic kidney disease with heart failure and stage 1 through stage 4 chronic kidney disease, or unspecified chronic kidney disease: Secondary | ICD-10-CM | POA: Diagnosis not present

## 2017-05-24 DIAGNOSIS — Z87891 Personal history of nicotine dependence: Secondary | ICD-10-CM | POA: Diagnosis not present

## 2017-05-24 DIAGNOSIS — I5032 Chronic diastolic (congestive) heart failure: Secondary | ICD-10-CM

## 2017-05-24 DIAGNOSIS — E875 Hyperkalemia: Secondary | ICD-10-CM | POA: Diagnosis not present

## 2017-05-24 NOTE — Progress Notes (Signed)
Diagnosis MGUS (monoclonal gammopathy of unknown significance) - Plan: CBC with Differential/Platelet, Comprehensive metabolic panel, Lactate dehydrogenase, Protein electrophoresis, serum, Beta 2 microglobulin, serum, IgG, IgA, IgM, Kappa/Lambda Light Chains, Free, With Ratio, 24Hr. Urine, Ferritin  Staging Cancer Staging No matching staging information was found for the patient.  Assessment and Plan: 1.   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  Labs done 05/03/2017 showed WBC 7.3 HB 11.4 Plts 194,000, Cr 1.34, K+ 5.2, Ca 8.7.  Pt will remain on observation.  She will RTC in 6 months for repeat lab evaluation.    2.  RI.  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 Has not had significant proteinuria to be concerned about overt AL amyloidosis.  She will continue to follow-up with nephrology.    3.  Hyperkalemia.  She reports K+ level has been elevated at 6.  K+ level today is 5.2.  She should continue to follow-up with nephrology and PCP for ongoing monitoring.    4.  HTN.  BP is 109/87.  Follow-up with PCP.    5.  Health maintenance.  Continue GI follow-up and mammogram screenings as recommended.      Interval history:  67 yr old female with  hypertension, dyslipidemia, coronary artery disease status post PCI, obstructive sleep apnea on CPAP, diabetes type 2, CKD stage 4, RTA type IV and bipolar disorder who was noted to have an elevation in her creatinine and was seen by nephrology in November 2014 and had a SPEP done as a part of the workup for her kidney disease and was noted to have an M spike of 0.6 with normal Lambda Light Chain Ratio. Immunofixation showed an IgG kappa monoclonal protein. Patient had hepatitis C antibody which was negative, ANA negative, C ANCA negative, p-ANCA negative, anti-double-stranded DNA antibody negative and complements were within normal  limits. Her creatinine at that time was 1.32. Patient reports that increase potassium levels were concerned that initially triggered her nephrology evaluation. Patient had a hemoglobin of 11.8 at the time and no significant protein in the urine. Patient reports she got a bone scan which was noted to be negative. She was not recommended a bone marrow examination based on no other acute concerns regarding multiple myeloma.  Patient was being followed with Dr Tressie Stalker and was last seen in 01/2015 when her M-spike was 0.7 and did not show any significant increase. Labs in July 2017 showed a hemoglobin of 10.4 and a urine total protein creatinine ratio 0.2. Creatinine has been gradually increasing over the last couple of years and is now up to 2 with BUN of 60 suggesting some element of dehydration. 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 She has had history of some hyperkalemia thought to be related to type IV RTA from her diabetes and possibly from Ace inhibitors which were discontinued.   Current Status:  Pt is seen today for follow-up.  She is here to go over labs.  She reports she had several episodes with elevated potassium.  She is followed by nephrology.     Problem List Patient Active Problem List   Diagnosis Date Noted  . Essential hypertension [I10] 04/10/2015  . Abnormal myocardial perfusion study [R94.39]   . SVT (supraventricular tachycardia) ([place]) [I47.1] 01/06/2012  . Mixed hyperlipidemia [E78.2] 01/06/2012  . Atypical chest  pain [R07.89]   . CHRONIC DIASTOLIC HEART FAILURE [C14.48] 11/22/2009  . OBSTRUCTIVE SLEEP APNEA [G47.33] 12/14/2007  . CORONARY ATHEROSCLEROSIS NATIVE CORONARY ARTERY [I25.10] 12/14/2007    Past Medical History Past Medical History:  Diagnosis Date  . Bipolar disorder ([place])   . Coronary atherosclerosis of native coronary artery    BMS circ 2006, 70% PDA 2009  . Essential  hypertension, benign   . Mixed hyperlipidemia   . OSA on CPAP   . Renal tubular acidosis, type IV   . SVT (supraventricular tachycardia) ([place])   . Type 2 diabetes mellitus [facility])     Past Surgical History Past Surgical History:  Procedure Laterality Date  . Acromioclavicular arthritis    . CARDIAC CATHETERIZATION N/A 03/29/2015   Procedure: Left Heart Cath and Coronary Angiography;  Surgeon: Troy Sine, MD;  Location: [facility];  Service: Cardiovascular;  Laterality: N/A;  . RIght shoulder adhesive capsulitis    . Rotator cuff impingement syndrome    . Rotator cuff tear      Family History Family History  Problem Relation Age of Onset  . Bipolar disorder Brother   . Diabetes Father      Social History  reports that she quit smoking about 3 years ago. Her smoking use included cigarettes. She started smoking about 32 years ago. She has a 20.00 pack-year smoking history. She has never used smokeless tobacco. She reports that she does not drink alcohol or use drugs.  Medications  Current Outpatient Medications:  .  allopurinol (ZYLOPRIM) 300 MG tablet, Take 150 mg by mouth daily., Disp: , Rfl:  .  aspirin 81 MG EC tablet, Take 81 mg by mouth daily.  , Disp: , Rfl:  .  buPROPion (WELLBUTRIN XL) 300 MG 24 hr tablet, Take 300 mg by mouth daily.  , Disp: , Rfl:  .  calcium carbonate (OS-CAL) 600 MG TABS, Take 600 mg by mouth daily.  , Disp: , Rfl:  .  Cholecalciferol (VITAMIN D) 400 UNITS capsule, Take 400 Units by mouth daily.  , Disp: , Rfl:  .  divalproex (DEPAKOTE) 250 MG EC tablet, Take 250 mg by mouth daily.  , Disp: , Rfl:  .  isosorbide mononitrate (IMDUR) 60 MG 24 hr tablet, Take 1.5 tablets (90 mg total) by mouth daily., Disp: 45 tablet, Rfl: 6 .  Liraglutide (VICTOZA) 18 MG/3ML SOLN, Inject 1.2 mg into the skin daily. , Disp: , Rfl:  .  losartan (COZAAR) 25 MG tablet, Take 25 mg by mouth daily., Disp: , Rfl:  .  Multiple Vitamin (MULTIVITAMIN) tablet, Take 1  tablet by mouth daily.  , Disp: , Rfl:  .  nitroGLYCERIN (NITROLINGUAL) 0.4 MG/SPRAY spray, Place 1 spray under the tongue every 5 (five) minutes x 3 doses as needed., Disp: 12 g, Rfl: 2 .  pantoprazole (PROTONIX) 40 MG tablet, Take 40 mg by mouth every other day. , Disp: , Rfl:  .  POLYETHYLENE GLYCOL 3350 PO, Take by mouth daily., Disp: , Rfl:  .  rosuvastatin (CRESTOR) 10 MG tablet, Take 10 mg by mouth daily., Disp: , Rfl:  .  sertraline (ZOLOFT) 100 MG tablet, Take 100 mg by mouth daily.  , Disp: , Rfl:  .  torsemide (DEMADEX) 20 MG tablet, TAKE TWO TABLETS BY MOUTH EVERY MORNING, Disp: 60 tablet, Rfl: 0 .  traZODone (DESYREL) 100 MG tablet, TAKE ONE TAB BY MOUTH AT BEDTIME, Disp: , Rfl: 3 .  TRESIBA FLEXTOUCH 200 UNIT/ML SOPN, Inject  38 Units into the skin every morning., Disp: , Rfl: 2  Allergies Ace inhibitors; Lisinopril; Penicillins; Tetracycline; and Lamictal [lamotrigine]  Review of Systems Review of Systems - Oncology ROS as per HPI otherwise 12 point ROS is negative.   Physical Exam  Vitals  T 98.7 HR 73 RR 16 BP 109/87 Pulse ox 94% on room air Wt Readings from Last 3 Encounters:  05/24/17 223 lb 6.4 oz (101.3 kg)  11/06/16 218 lb 6.4 oz (99.1 kg)  06/08/16 212 lb (96.2 kg)   Temp Readings from Last 3 Encounters:  05/24/17 98.7 F (37.1 C) (Oral)  05/07/16 98.3 F (36.8 C) (Oral)  09/25/15 98.2 F (36.8 C) (Oral)   BP Readings from Last 3 Encounters:  11/06/16 (!) 145/52  06/08/16 129/69  05/07/16 (!) 137/48   Pulse Readings from Last 3 Encounters:  05/24/17 73  11/06/16 69  06/08/16 (!) 57    Constitutional: Well-developed, well-nourished, and in no distress.   HENT: Head: Normocephalic and atraumatic.  Mouth/Throat: No oropharyngeal exudate. Mucosa moist. Eyes: Pupils are equal, round, and reactive to light. Conjunctivae are normal. No scleral icterus.  Neck: Normal range of motion. Neck supple. No JVD present.  Cardiovascular: Normal rate, regular  rhythm and normal heart sounds.  Exam reveals no gallop and no friction rub.   No murmur heard. Pulmonary/Chest: Effort normal and breath sounds normal. No respiratory distress. No wheezes.No rales.  Abdominal: Soft. Bowel sounds are normal. No distension. There is no tenderness. There is no guarding.  Musculoskeletal: No edema or tenderness.  Lymphadenopathy: No cervical, axillary or supraclavicular adenopathy.  Neurological: Alert and oriented to person, place, and time. No cranial nerve deficit.  Skin: Skin is warm and dry. No rash noted. No erythema. No pallor.  Psychiatric: Affect and judgment normal.   Labs No visits with results within 3 Day(s) from this visit.  Latest known visit with results is:  Appointment on 05/03/2017  Component Date Value Ref Range Status  . WBC 05/03/2017 7.3  4.0 - 10.5 K/uL Final  . RBC 05/03/2017 3.66* 3.87 - 5.11 MIL/uL Final  . Hemoglobin 05/03/2017 11.4* 12.0 - 15.0 g/dL Final  . HCT 05/03/2017 36.2  36.0 - 46.0 % Final  . MCV 05/03/2017 98.9  78.0 - 100.0 fL Final  . MCH 05/03/2017 31.1  26.0 - 34.0 pg Final  . MCHC 05/03/2017 31.5  30.0 - 36.0 g/dL Final  . RDW 05/03/2017 12.4  11.5 - 15.5 % Final  . Platelets 05/03/2017 194  150 - 400 K/uL Final  . Neutrophils Relative % 05/03/2017 63  % Final  . Neutro Abs 05/03/2017 4.6  1.7 - 7.7 K/uL Final  . Lymphocytes Relative 05/03/2017 24  % Final  . Lymphs Abs 05/03/2017 1.8  0.7 - 4.0 K/uL Final  . Monocytes Relative 05/03/2017 5  % Final  . Monocytes Absolute 05/03/2017 0.4  0.1 - 1.0 K/uL Final  . Eosinophils Relative 05/03/2017 7  % Final  . Eosinophils Absolute 05/03/2017 0.5  0.0 - 0.7 K/uL Final  . Basophils Relative 05/03/2017 1  % Final  . Basophils Absolute 05/03/2017 0.0  0.0 - 0.1 K/uL Final   Performed at [facility], [address] [place], [place] [address]  . Sodium 05/03/2017 139  135 - 145 mmol/L Final  . Potassium 05/03/2017 5.2* 3.5 - 5.1 mmol/L Final  . Chloride 05/03/2017  106  101 - 111 mmol/L Final  . CO2 05/03/2017 25  22 - 32 mmol/L Final  .  Glucose, Bld 05/03/2017 176* 65 - 99 mg/dL Final  . BUN 05/03/2017 45* 6 - 20 mg/dL Final  . Creatinine, Ser 05/03/2017 1.34* 0.44 - 1.00 mg/dL Final  . Calcium 05/03/2017 8.7* 8.9 - 10.3 mg/dL Final  . Total Protein 05/03/2017 7.4  6.5 - 8.1 g/dL Final  . Albumin 05/03/2017 3.8  3.5 - 5.0 g/dL Final  . AST 05/03/2017 19  15 - 41 U/L Final  . ALT 05/03/2017 16  14 - 54 U/L Final  . Alkaline Phosphatase 05/03/2017 60  38 - 126 U/L Final  . Total Bilirubin 05/03/2017 0.5  0.3 - 1.2 mg/dL Final  . GFR calc non Af Amer 05/03/2017 40* >60 mL/min Final  . GFR calc Af Amer 05/03/2017 46* >60 mL/min Final   Comment: (NOTE) The eGFR has been calculated using the CKD EPI equation. This calculation has not been validated in all clinical situations. eGFR's persistently <60 mL/min signify possible Chronic Kidney Disease.   Georgiann Hahn gap 05/03/2017 8  5 - 15 Final   Performed at [facility], [address] [place], [place] [address]  . IgG (Immunoglobin G), Serum 05/03/2017 1,314  700 - 1,600 mg/dL Final  . IgA 05/03/2017 147  87 - 352 mg/dL Final  . IgM (Immunoglobulin M), Srm 05/03/2017 246* 26 - 217 mg/dL Final   Comment: (NOTE) Performed At: [facility] [place], [place] [id number] Rush Farmer MD [id number] Performed at [facility], [address], [place], [place] [address]   . Total Protein ELP 05/03/2017 6.8  6.0 - 8.5 g/dL Final  . IgG (Immunoglobin G), Serum 05/03/2017 1,253  700 - 1,600 mg/dL Final  . IgA 05/03/2017 150  87 - 352 mg/dL Final  . IgM (Immunoglobulin M), Srm 05/03/2017 236* 26 - 217 mg/dL Final   Comment: (NOTE) Performed At: [facility] [place], [place] [id number] Rush Farmer MD [id number]   . Immunofixation Result, Serum 05/03/2017 Comment   Corrected   Comment: (NOTE) An apparent polyclonal gammopathy: IgM. Kappa and lambda  typing appear increased. Performed at [facility], [address] [place], [place] [address]   . Kappa free light chain 05/03/2017 47.7* 3.3 - 19.4 mg/L Final  . Lamda free light chains 05/03/2017 43.9* 5.7 - 26.3 mg/L Final  . Kappa, lamda light chain ratio 05/03/2017 1.09  0.26 - 1.65 Final   Comment: (NOTE) Performed At: [facility] [place], [place] [id number] Rush Farmer MD [id number] Performed at [facility], [address], [place], [place] [address]   . Total Protein ELP 05/03/2017 6.6  6.0 - 8.5 g/dL Final  . Albumin ELP 05/03/2017 3.5  2.9 - 4.4 g/dL Final  . Alpha-1-Globulin 05/03/2017 0.2  0.0 - 0.4 g/dL Final  . Alpha-2-Globulin 05/03/2017 0.6  0.4 - 1.0 g/dL Final  . Beta Globulin 05/03/2017 1.0  0.7 - 1.3 g/dL Final  . Gamma Globulin 05/03/2017 1.3  0.4 - 1.8 g/dL Final  . M-Spike, % 05/03/2017 0.4* Not Observed g/dL Final  . SPE Interp. 05/03/2017 Comment   Final   Comment: (NOTE) The SPE pattern demonstrates a single peak (M-spike) in the gamma region which may represent monoclonal protein. This peak may also be caused by circulating immune complexes, cryoglobulins, C-reactive protein, fibrinogen or hemolysis.  If clinically indicated, the presence of a monoclonal gammopathy may be confirmed by immuno- fixation, as well as an evaluation of the urine for the presence of Bence-Jones protein. Performed At: [facility]  [place], [place] [id number] Rush Farmer MD PF:[id number]   . Comment 05/03/2017 Comment   Final   Comment: (NOTE) Protein electrophoresis scan will follow via computer, mail, or courier delivery.   Marland Kitchen GLOBULIN, TOTAL 05/03/2017 3.1  2.2 - 3.9 g/dL Corrected  . A/G Ratio 05/03/2017 1.1  0.7 - 1.7 Corrected   Performed at [facility], [address], [place], [place] [address]     Pathology Orders Placed This Encounter  Procedures  . CBC with Differential/Platelet    Standing Status:    Future    Standing Expiration Date:   05/25/2018  . Comprehensive metabolic panel    Standing Status:   Future    Standing Expiration Date:   05/25/2018  . Lactate dehydrogenase    Standing Status:   Future    Standing Expiration Date:   05/25/2018  . Protein electrophoresis, serum    Standing Status:   Future    Standing Expiration Date:   05/25/2018  . Beta 2 microglobulin, serum    Standing Status:   Future    Standing Expiration Date:   05/25/2018  . IgG, IgA, IgM    Standing Status:   Future    Standing Expiration Date:   05/25/2018  . Kappa/Lambda Light Chains, Free, With Ratio, 24Hr. Urine    Standing Status:   Future    Standing Expiration Date:   05/25/2018  . Ferritin    Standing Status:   Future    Standing Expiration Date:   05/25/2018       Zoila Shutter MD

## 2017-05-27 ENCOUNTER — Encounter (INDEPENDENT_AMBULATORY_CARE_PROVIDER_SITE_OTHER): Payer: Medicare Other | Admitting: Ophthalmology

## 2017-05-27 DIAGNOSIS — H35033 Hypertensive retinopathy, bilateral: Secondary | ICD-10-CM | POA: Diagnosis not present

## 2017-05-27 DIAGNOSIS — E113391 Type 2 diabetes mellitus with moderate nonproliferative diabetic retinopathy without macular edema, right eye: Secondary | ICD-10-CM

## 2017-05-27 DIAGNOSIS — E113312 Type 2 diabetes mellitus with moderate nonproliferative diabetic retinopathy with macular edema, left eye: Secondary | ICD-10-CM | POA: Diagnosis not present

## 2017-05-27 DIAGNOSIS — I10 Essential (primary) hypertension: Secondary | ICD-10-CM

## 2017-05-27 DIAGNOSIS — E11311 Type 2 diabetes mellitus with unspecified diabetic retinopathy with macular edema: Secondary | ICD-10-CM

## 2017-05-27 DIAGNOSIS — H43813 Vitreous degeneration, bilateral: Secondary | ICD-10-CM

## 2017-05-30 ENCOUNTER — Other Ambulatory Visit: Payer: Self-pay | Admitting: Cardiology

## 2017-06-11 DIAGNOSIS — E7800 Pure hypercholesterolemia, unspecified: Secondary | ICD-10-CM | POA: Diagnosis not present

## 2017-06-11 DIAGNOSIS — E119 Type 2 diabetes mellitus without complications: Secondary | ICD-10-CM | POA: Diagnosis not present

## 2017-06-11 DIAGNOSIS — I10 Essential (primary) hypertension: Secondary | ICD-10-CM | POA: Diagnosis not present

## 2017-06-11 DIAGNOSIS — I2510 Atherosclerotic heart disease of native coronary artery without angina pectoris: Secondary | ICD-10-CM | POA: Diagnosis not present

## 2017-06-23 DIAGNOSIS — I2510 Atherosclerotic heart disease of native coronary artery without angina pectoris: Secondary | ICD-10-CM | POA: Diagnosis not present

## 2017-06-23 DIAGNOSIS — E119 Type 2 diabetes mellitus without complications: Secondary | ICD-10-CM | POA: Diagnosis not present

## 2017-06-23 DIAGNOSIS — E7800 Pure hypercholesterolemia, unspecified: Secondary | ICD-10-CM | POA: Diagnosis not present

## 2017-06-23 DIAGNOSIS — I10 Essential (primary) hypertension: Secondary | ICD-10-CM | POA: Diagnosis not present

## 2017-06-24 DIAGNOSIS — E1140 Type 2 diabetes mellitus with diabetic neuropathy, unspecified: Secondary | ICD-10-CM | POA: Diagnosis not present

## 2017-06-24 DIAGNOSIS — L110 Acquired keratosis follicularis: Secondary | ICD-10-CM | POA: Diagnosis not present

## 2017-06-24 DIAGNOSIS — L609 Nail disorder, unspecified: Secondary | ICD-10-CM | POA: Diagnosis not present

## 2017-07-07 DIAGNOSIS — F3112 Bipolar disorder, current episode manic without psychotic features, moderate: Secondary | ICD-10-CM | POA: Diagnosis not present

## 2017-07-07 DIAGNOSIS — F411 Generalized anxiety disorder: Secondary | ICD-10-CM | POA: Diagnosis not present

## 2017-07-07 DIAGNOSIS — F4310 Post-traumatic stress disorder, unspecified: Secondary | ICD-10-CM | POA: Diagnosis not present

## 2017-07-15 DIAGNOSIS — N183 Chronic kidney disease, stage 3 (moderate): Secondary | ICD-10-CM | POA: Diagnosis not present

## 2017-07-15 DIAGNOSIS — E559 Vitamin D deficiency, unspecified: Secondary | ICD-10-CM | POA: Diagnosis not present

## 2017-07-15 DIAGNOSIS — Z79899 Other long term (current) drug therapy: Secondary | ICD-10-CM | POA: Diagnosis not present

## 2017-07-15 DIAGNOSIS — R809 Proteinuria, unspecified: Secondary | ICD-10-CM | POA: Diagnosis not present

## 2017-07-15 DIAGNOSIS — D509 Iron deficiency anemia, unspecified: Secondary | ICD-10-CM | POA: Diagnosis not present

## 2017-07-15 DIAGNOSIS — I129 Hypertensive chronic kidney disease with stage 1 through stage 4 chronic kidney disease, or unspecified chronic kidney disease: Secondary | ICD-10-CM | POA: Diagnosis not present

## 2017-07-19 DIAGNOSIS — E7800 Pure hypercholesterolemia, unspecified: Secondary | ICD-10-CM | POA: Diagnosis not present

## 2017-07-19 DIAGNOSIS — I2510 Atherosclerotic heart disease of native coronary artery without angina pectoris: Secondary | ICD-10-CM | POA: Diagnosis not present

## 2017-07-19 DIAGNOSIS — I10 Essential (primary) hypertension: Secondary | ICD-10-CM | POA: Diagnosis not present

## 2017-07-19 DIAGNOSIS — E119 Type 2 diabetes mellitus without complications: Secondary | ICD-10-CM | POA: Diagnosis not present

## 2017-07-20 ENCOUNTER — Telehealth: Payer: Self-pay

## 2017-07-20 DIAGNOSIS — I10 Essential (primary) hypertension: Secondary | ICD-10-CM | POA: Diagnosis not present

## 2017-07-20 DIAGNOSIS — N183 Chronic kidney disease, stage 3 (moderate): Secondary | ICD-10-CM | POA: Diagnosis not present

## 2017-07-20 DIAGNOSIS — E875 Hyperkalemia: Secondary | ICD-10-CM | POA: Diagnosis not present

## 2017-07-20 DIAGNOSIS — D649 Anemia, unspecified: Secondary | ICD-10-CM | POA: Diagnosis not present

## 2017-07-20 MED ORDER — AMLODIPINE BESYLATE 2.5 MG PO TABS: 2.5000 mg | ORAL_TABLET | Freq: Every day | ORAL | 0 refills | Status: DC

## 2017-07-20 NOTE — Telephone Encounter (Signed)
Patient notified. Rx sent. Patient requested 90 days

## 2017-07-20 NOTE — Telephone Encounter (Signed)
Let us start out on Norvasc 2.5 mg daily.  Continue to watch blood pressure.

## 2017-07-20 NOTE — Telephone Encounter (Signed)
Med list was confirmed during phone call. Only medication patient holding is losartan

## 2017-07-20 NOTE — Telephone Encounter (Signed)
Patient contacted office with BP readings.   6/11 BP 169/86 HR 67 6/12 BP 160/69 HR 64 6/14 BP 162/77 HR 65 6/15 BP 144/64 HR 65 6/17 BP 140/69 HR 66 6/20 BP 163/73 HR 65 6/23 BP 158/65 HR 61 6/29 BP 160/59 HR 62 7/2 BP 178/80 HR 68  Patient is wondering if she needs to restart taking medication. Patient stated when she seen her kidney dr today they told her to hold losartan due to potassium being elevated. Records requested.

## 2017-07-20 NOTE — Telephone Encounter (Signed)
Please verify her current medication list.  We might consider using something like Norvasc which should not affect either her heart rate or her potassium levels.

## 2017-07-26 DIAGNOSIS — Z299 Encounter for prophylactic measures, unspecified: Secondary | ICD-10-CM | POA: Diagnosis not present

## 2017-07-26 DIAGNOSIS — E1165 Type 2 diabetes mellitus with hyperglycemia: Secondary | ICD-10-CM | POA: Diagnosis not present

## 2017-07-26 DIAGNOSIS — G4733 Obstructive sleep apnea (adult) (pediatric): Secondary | ICD-10-CM | POA: Diagnosis not present

## 2017-07-26 DIAGNOSIS — Z6841 Body Mass Index (BMI) 40.0 and over, adult: Secondary | ICD-10-CM | POA: Diagnosis not present

## 2017-07-26 DIAGNOSIS — E1142 Type 2 diabetes mellitus with diabetic polyneuropathy: Secondary | ICD-10-CM | POA: Diagnosis not present

## 2017-07-26 DIAGNOSIS — E1122 Type 2 diabetes mellitus with diabetic chronic kidney disease: Secondary | ICD-10-CM | POA: Diagnosis not present

## 2017-07-26 DIAGNOSIS — N183 Chronic kidney disease, stage 3 (moderate): Secondary | ICD-10-CM | POA: Diagnosis not present

## 2017-07-26 DIAGNOSIS — I10 Essential (primary) hypertension: Secondary | ICD-10-CM | POA: Diagnosis not present

## 2017-07-30 DIAGNOSIS — Z79899 Other long term (current) drug therapy: Secondary | ICD-10-CM | POA: Diagnosis not present

## 2017-07-30 DIAGNOSIS — E875 Hyperkalemia: Secondary | ICD-10-CM | POA: Diagnosis not present

## 2017-07-30 DIAGNOSIS — I129 Hypertensive chronic kidney disease with stage 1 through stage 4 chronic kidney disease, or unspecified chronic kidney disease: Secondary | ICD-10-CM | POA: Diagnosis not present

## 2017-07-30 DIAGNOSIS — N183 Chronic kidney disease, stage 3 (moderate): Secondary | ICD-10-CM | POA: Diagnosis not present

## 2017-08-06 ENCOUNTER — Ambulatory Visit: Payer: Medicare Other | Admitting: Physician Assistant

## 2017-08-11 DIAGNOSIS — Z299 Encounter for prophylactic measures, unspecified: Secondary | ICD-10-CM | POA: Diagnosis not present

## 2017-08-11 DIAGNOSIS — I4891 Unspecified atrial fibrillation: Secondary | ICD-10-CM | POA: Diagnosis not present

## 2017-08-11 DIAGNOSIS — Z6841 Body Mass Index (BMI) 40.0 and over, adult: Secondary | ICD-10-CM | POA: Diagnosis not present

## 2017-08-11 DIAGNOSIS — I509 Heart failure, unspecified: Secondary | ICD-10-CM | POA: Diagnosis not present

## 2017-08-11 DIAGNOSIS — I10 Essential (primary) hypertension: Secondary | ICD-10-CM | POA: Diagnosis not present

## 2017-08-11 DIAGNOSIS — E1122 Type 2 diabetes mellitus with diabetic chronic kidney disease: Secondary | ICD-10-CM | POA: Diagnosis not present

## 2017-08-24 DIAGNOSIS — Z6841 Body Mass Index (BMI) 40.0 and over, adult: Secondary | ICD-10-CM | POA: Diagnosis not present

## 2017-08-24 DIAGNOSIS — Z299 Encounter for prophylactic measures, unspecified: Secondary | ICD-10-CM | POA: Diagnosis not present

## 2017-08-24 DIAGNOSIS — I10 Essential (primary) hypertension: Secondary | ICD-10-CM | POA: Diagnosis not present

## 2017-08-24 DIAGNOSIS — I509 Heart failure, unspecified: Secondary | ICD-10-CM | POA: Diagnosis not present

## 2017-09-01 DIAGNOSIS — L110 Acquired keratosis follicularis: Secondary | ICD-10-CM | POA: Diagnosis not present

## 2017-09-01 DIAGNOSIS — E1140 Type 2 diabetes mellitus with diabetic neuropathy, unspecified: Secondary | ICD-10-CM | POA: Diagnosis not present

## 2017-09-01 DIAGNOSIS — L609 Nail disorder, unspecified: Secondary | ICD-10-CM | POA: Diagnosis not present

## 2017-09-03 ENCOUNTER — Ambulatory Visit: Payer: Medicare Other | Admitting: Cardiology

## 2017-09-03 ENCOUNTER — Telehealth: Payer: Self-pay | Admitting: Cardiology

## 2017-09-03 NOTE — Telephone Encounter (Signed)
BP log on Dr. Chuck Hint desk to review

## 2017-09-03 NOTE — Telephone Encounter (Signed)
Patient walked into office today with BP recordings. States that her PCP Dr. Manuella Ghazi has changed her BP medications. She states that it continues to stay high.

## 2017-09-06 NOTE — Telephone Encounter (Signed)
I reviewed the blood pressure log.  Please schedule an office visit for her, last seen in March 2018.  It will probably be easier to go over the medications in person and see what she has been tolerating.

## 2017-09-06 NOTE — Telephone Encounter (Signed)
Patient notified and verbalized understanding. 

## 2017-09-07 ENCOUNTER — Encounter

## 2017-09-07 ENCOUNTER — Ambulatory Visit (INDEPENDENT_AMBULATORY_CARE_PROVIDER_SITE_OTHER): Payer: Medicare Other | Admitting: Cardiology

## 2017-09-07 ENCOUNTER — Encounter: Payer: Self-pay | Admitting: Cardiology

## 2017-09-07 VITALS — BP 129/68 | HR 65 | Ht 63.0 in | Wt 229.0 lb

## 2017-09-07 DIAGNOSIS — I2510 Atherosclerotic heart disease of native coronary artery without angina pectoris: Secondary | ICD-10-CM | POA: Diagnosis not present

## 2017-09-07 DIAGNOSIS — I5032 Chronic diastolic (congestive) heart failure: Secondary | ICD-10-CM

## 2017-09-07 DIAGNOSIS — I471 Supraventricular tachycardia: Secondary | ICD-10-CM | POA: Diagnosis not present

## 2017-09-07 DIAGNOSIS — I10 Essential (primary) hypertension: Secondary | ICD-10-CM | POA: Diagnosis not present

## 2017-09-07 MED ORDER — LABETALOL HCL 100 MG PO TABS: 100.0000 mg | ORAL_TABLET | Freq: Two times a day (BID) | ORAL | 0 refills | Status: DC

## 2017-09-07 NOTE — Progress Notes (Signed)
Cardiology Office Note  Date: 09/07/2017   ID: KELIAH [name], DOB Jul 05, 1950, MRN [id number]  PCP: Monico Blitz, MD  Primary Cardiologist: Rozann Lesches, MD   Chief Complaint  Patient presents with  . Cardiac follow-up    History of Present Illness: BEVERELY [name] is a 67 y.o. female last seen in May 2018.  She presents to the office for follow-up and review of blood pressure control.  Since I last saw her her blood pressure has trended upward, her medications have also been changed significantly.  She was taken off Cozaar by Dr. Lowanda Foster due to hyperkalemia.  In the past we had taken her off labetalol and Cardizem CD due to symptomatic bradycardia.  We went over her home blood pressure log and heart rates today.  Current medications include Norvasc, Imdur, Demadex, and Toprol-XL.  I personally reviewed her ECG today which shows sinus rhythm with poor R wave progression, old.  She has been working part-time recently, drives the Restaurant manager, fast food.  Past Medical History:  Diagnosis Date  . Bipolar disorder ([place])   . Coronary atherosclerosis of native coronary artery    BMS circ 2006, 70% PDA 2009  . Essential hypertension   . Mixed hyperlipidemia   . OSA on CPAP   . Renal tubular acidosis, type IV   . SVT (supraventricular tachycardia) ([place])   . Type 2 diabetes mellitus ([place])     Past Surgical History:  Procedure Laterality Date  . Acromioclavicular arthritis    . CARDIAC CATHETERIZATION N/A 03/29/2015   Procedure: Left Heart Cath and Coronary Angiography;  Surgeon: Troy Sine, MD;  Location: [facility];  Service: Cardiovascular;  Laterality: N/A;  . RIght shoulder adhesive capsulitis    . Rotator cuff impingement syndrome    . Rotator cuff tear      Current Outpatient Medications  Medication Sig Dispense Refill  . allopurinol (ZYLOPRIM) 300 MG tablet Take 150 mg by mouth daily.    Marland Kitchen amLODipine (NORVASC) 10 MG tablet Take 10 mg by mouth daily.    Marland Kitchen  aspirin 81 MG EC tablet Take 81 mg by mouth daily.      Marland Kitchen buPROPion (WELLBUTRIN XL) 300 MG 24 hr tablet Take 300 mg by mouth daily.      . calcium carbonate (OS-CAL) 600 MG TABS Take 600 mg by mouth daily.      . Cholecalciferol (VITAMIN D) 400 UNITS capsule Take 400 Units by mouth daily.      . divalproex (DEPAKOTE) 250 MG EC tablet Take 250 mg by mouth daily.      . isosorbide mononitrate (IMDUR) 60 MG 24 hr tablet Take 1.5 tablets (90 mg total) by mouth daily. 45 tablet 6  . Liraglutide (VICTOZA) 18 MG/3ML SOLN Inject 1.2 mg into the skin daily.     . Multiple Vitamin (MULTIVITAMIN) tablet Take 1 tablet by mouth daily.      . nitroGLYCERIN (NITROLINGUAL) 0.4 MG/SPRAY spray Place 1 spray under the tongue every 5 (five) minutes x 3 doses as needed. 12 g 2  . pantoprazole (PROTONIX) 40 MG tablet Take 40 mg by mouth every other day.     Marland Kitchen POLYETHYLENE GLYCOL 3350 PO Take by mouth as needed.     . rosuvastatin (CRESTOR) 10 MG tablet Take 10 mg by mouth daily.    . sertraline (ZOLOFT) 100 MG tablet Take 100 mg by mouth daily.      Marland Kitchen torsemide (DEMADEX) 20 MG tablet TAKE TWO  TABLETS BY MOUTH EVERY MORNING 60 tablet 6  . traZODone (DESYREL) 100 MG tablet TAKE ONE TAB BY MOUTH AT BEDTIME  3  . TRESIBA FLEXTOUCH 200 UNIT/ML SOPN Inject 38 Units into the skin every morning.  2  . labetalol (NORMODYNE) 100 MG tablet Take 1 tablet (100 mg total) by mouth 2 (two) times daily. 60 tablet 0   No current facility-administered medications for this visit.    Allergies:  Ace inhibitors; Lisinopril; Penicillins; Tetracycline; and Lamictal [lamotrigine]   Social History: The patient  reports that she quit smoking about 4 years ago. Her smoking use included cigarettes. She started smoking about 32 years ago. She has a 20.00 pack-year smoking history. She has never used smokeless tobacco. She reports that she does not drink alcohol or use drugs.   ROS:  Please see the history of present illness. Otherwise,  complete review of systems is positive for none.  All other systems are reviewed and negative.   Physical Exam: VS:  BP 129/68   Pulse 65   Ht 5\' 3"  (1.6 m)   Wt 229 lb (103.9 kg)   SpO2 94%   BMI 40.57 kg/m , BMI Body mass index is 40.57 kg/m.  Wt Readings from Last 3 Encounters:  09/07/17 229 lb (103.9 kg)  05/24/17 223 lb 6.4 oz (101.3 kg)  11/06/16 218 lb 6.4 oz (99.1 kg)    General: Patient appears comfortable at rest. HEENT: Conjunctiva and lids normal, oropharynx clear. Neck: Supple, no elevated JVP or carotid bruits, no thyromegaly. Lungs: Clear to auscultation, nonlabored breathing at rest. Cardiac: Regular rate and rhythm, no S3, 2/6 systolic murmur, no pericardial rub. Abdomen: Soft, nontender, bowel sounds present. Extremities: No pitting edema, distal pulses 2+. Skin: Warm and dry. Musculoskeletal: No kyphosis. Neuropsychiatric: Alert and oriented x3, affect grossly appropriate.  ECG: I personally reviewed the tracing from 06/08/2016 which showed sinus rhythm with IVCD and poor R wave progression anteriorly.  Recent Labwork: 05/03/2017: ALT 16; AST 19; BUN 45; Creatinine, Ser 1.34; Hemoglobin 11.4; Platelets 194; Potassium 5.2; Sodium 139   Other Studies Reviewed Today:  Cardiac catheterization 03/29/2015: No significant coronary obstructive disease with evidence for widely patent stent in the proximal left circumflex artery, a normal LAD and a normal very large dominant RCA.  Systemic hypertension with elevated left ventricular end-diastolic pressure at 37 mm.  Echocardiogram 03/13/2015: Study Conclusions  - Left ventricle: The cavity size was normal. Wall thickness was increased in a pattern of mild LVH. Systolic function was normal. The estimated ejection fraction was in the range of 60% to 65%. Wall motion was normal; there were no regional wall motion abnormalities. Doppler parameters are consistent with restrictive physiology, indicative of  decreased left ventricular diastolic compliance and/or increased left atrial pressure. - Aortic valve: There was mild regurgitation. Valve area (VTI): 2.84 cm^2. Valve area (Vmax): 2.77 cm^2. Valve area (Vmean): 2.57 cm^2. - Mitral valve: Mildly calcified annulus. Mildly thickened leaflets . There was mild regurgitation. - Left atrium: The atrium was severely dilated. - Right atrium: The atrium was mildly dilated. - Pulmonary arteries: Systolic pressure was mildly increased. PA peak pressure: 37 mm Hg (S). - Technically difficult study.  Assessment and Plan:  1.  Essential hypertension, blood pressure has been trending upward, not optimally controlled at this time.  We discussed her medications and will plan to stop Toprol-XL in favor of resuming labetalol which would perhaps provide better blood pressure control in general.  We will begin at 100 mg  twice daily and titrate from there.  Otherwise continue Norvasc.  Nurse visit in 2 weeks.  2.  CAD with history of BMS to the circumflex in 2006.  Cardiac catheterization in 2017 revealed widely patent stent site and no progressive CAD otherwise.  She reports no active angina symptoms and also continues on aspirin as well as Crestor.  3.  Chronic diastolic heart failure, continues on Demadex.  No progressive shortness of breath.  4.  History of SVT, quiescent at this time.  Current medicines were reviewed with the patient today.   Orders Placed This Encounter  Procedures  . EKG 12-Lead    Disposition: Nurse visit in 2 weeks.  Signed, Satira Sark, MD, [facility] 09/07/2017 4:15 PM    [facility] [place], [place],  [address] Phone: [phone]; Fax: [phone]

## 2017-09-07 NOTE — Patient Instructions (Signed)
Medication Instructions:  Your physician has recommended you make the following change in your medication:    STOP Metoprolol   START Labetalol 100 mg twice daily   Please continue all other medications as prescribed  Labwork: NONE  Testing/Procedures: NONE  Follow-Up: Your physician recommends that you schedule a follow-up appointment in: 2 WEEKS FOR NURSE VISIT VISIT WITH DR. MCDOWELL PENDING NURSE VISIT   Any Other Special Instructions Will Be Listed Below (If Applicable).  If you need a refill on your cardiac medications before your next appointment, please call your pharmacy.

## 2017-09-21 ENCOUNTER — Ambulatory Visit: Payer: Medicare Other

## 2017-09-23 ENCOUNTER — Ambulatory Visit (INDEPENDENT_AMBULATORY_CARE_PROVIDER_SITE_OTHER): Payer: Medicare Other | Admitting: *Deleted

## 2017-09-23 VITALS — BP 148/75 | HR 53

## 2017-09-23 DIAGNOSIS — I10 Essential (primary) hypertension: Secondary | ICD-10-CM | POA: Diagnosis not present

## 2017-09-23 NOTE — Progress Notes (Signed)
Patient in office for BP check.  Took morning meds around 8:30 am.    See BP log for list of her readings - placed in box on MD desk.

## 2017-09-24 ENCOUNTER — Telehealth: Payer: Self-pay | Admitting: Cardiology

## 2017-09-24 MED ORDER — LABETALOL HCL 200 MG PO TABS: 200.0000 mg | ORAL_TABLET | Freq: Two times a day (BID) | ORAL | 6 refills | Status: DC

## 2017-09-24 NOTE — Telephone Encounter (Signed)
Already addressed.  See nurse visit documentation.

## 2017-09-24 NOTE — Progress Notes (Signed)
Patient notified and verbalized understanding.  New prescription sent to [facility] at pt request.

## 2017-09-24 NOTE — Progress Notes (Signed)
I reviewed the home blood pressure log.  It looks like the overall trend has been somewhat better at least within the last 5 days.  I would suggest that we increase the labetalol to 200 mg twice daily from here.

## 2017-09-24 NOTE — Telephone Encounter (Signed)
Christy [name] returned call to [place].

## 2017-09-24 NOTE — Progress Notes (Signed)
Voice mail not set up on home number & no answer on mobile.

## 2017-10-01 ENCOUNTER — Telehealth: Payer: Self-pay | Admitting: *Deleted

## 2017-10-01 NOTE — Telephone Encounter (Signed)
Blood pressure numbers continue to be elevated even after increase on the Labetalol.  Increased to 200mg  twice a day on 09/24/17.  Does state that she has been under a lot of stress with her 68 year old mother going down Christy [name].  9/6 - 170/61  61 9/7 - 174/65  60 9/8 - 172/72  65 9/9 - 185/71  58  9/10 - 160/74  66  9/11 - 154/64  58  9/13 - 168/68  53   Scheduled OV for 11/10/2017.

## 2017-10-02 NOTE — Telephone Encounter (Signed)
There is still more room to increase labetalol, main limiting factor will be heart rate actually.  Increase labetalol to 300 mg twice daily.

## 2017-10-04 NOTE — Telephone Encounter (Signed)
Voice mail box not set up on home number.    Left message on mobile.

## 2017-10-05 MED ORDER — LABETALOL HCL 300 MG PO TABS: 300.0000 mg | ORAL_TABLET | Freq: Two times a day (BID) | ORAL | 1 refills | Status: DC

## 2017-10-05 NOTE — Telephone Encounter (Signed)
Patient informed and verbalized understanding of plan. 

## 2017-10-05 NOTE — Telephone Encounter (Signed)
Patient returned call to [facility].

## 2017-10-18 DIAGNOSIS — Z23 Encounter for immunization: Secondary | ICD-10-CM | POA: Diagnosis not present

## 2017-10-19 ENCOUNTER — Ambulatory Visit: Payer: Medicare Other | Admitting: Cardiology

## 2017-10-29 DIAGNOSIS — I2510 Atherosclerotic heart disease of native coronary artery without angina pectoris: Secondary | ICD-10-CM | POA: Diagnosis not present

## 2017-10-29 DIAGNOSIS — I10 Essential (primary) hypertension: Secondary | ICD-10-CM | POA: Diagnosis not present

## 2017-10-29 DIAGNOSIS — E7800 Pure hypercholesterolemia, unspecified: Secondary | ICD-10-CM | POA: Diagnosis not present

## 2017-10-29 DIAGNOSIS — E119 Type 2 diabetes mellitus without complications: Secondary | ICD-10-CM | POA: Diagnosis not present

## 2017-11-02 ENCOUNTER — Encounter (INDEPENDENT_AMBULATORY_CARE_PROVIDER_SITE_OTHER): Payer: Medicare Other | Admitting: Ophthalmology

## 2017-11-02 DIAGNOSIS — E113312 Type 2 diabetes mellitus with moderate nonproliferative diabetic retinopathy with macular edema, left eye: Secondary | ICD-10-CM | POA: Diagnosis not present

## 2017-11-02 DIAGNOSIS — H43813 Vitreous degeneration, bilateral: Secondary | ICD-10-CM | POA: Diagnosis not present

## 2017-11-02 DIAGNOSIS — I10 Essential (primary) hypertension: Secondary | ICD-10-CM | POA: Diagnosis not present

## 2017-11-02 DIAGNOSIS — H35033 Hypertensive retinopathy, bilateral: Secondary | ICD-10-CM | POA: Diagnosis not present

## 2017-11-02 DIAGNOSIS — E113391 Type 2 diabetes mellitus with moderate nonproliferative diabetic retinopathy without macular edema, right eye: Secondary | ICD-10-CM

## 2017-11-02 DIAGNOSIS — E11311 Type 2 diabetes mellitus with unspecified diabetic retinopathy with macular edema: Secondary | ICD-10-CM | POA: Diagnosis not present

## 2017-11-03 DIAGNOSIS — N183 Chronic kidney disease, stage 3 (moderate): Secondary | ICD-10-CM | POA: Diagnosis not present

## 2017-11-03 DIAGNOSIS — Z299 Encounter for prophylactic measures, unspecified: Secondary | ICD-10-CM | POA: Diagnosis not present

## 2017-11-03 DIAGNOSIS — I10 Essential (primary) hypertension: Secondary | ICD-10-CM | POA: Diagnosis not present

## 2017-11-03 DIAGNOSIS — E1165 Type 2 diabetes mellitus with hyperglycemia: Secondary | ICD-10-CM | POA: Diagnosis not present

## 2017-11-03 DIAGNOSIS — I509 Heart failure, unspecified: Secondary | ICD-10-CM | POA: Diagnosis not present

## 2017-11-03 DIAGNOSIS — E1122 Type 2 diabetes mellitus with diabetic chronic kidney disease: Secondary | ICD-10-CM | POA: Diagnosis not present

## 2017-11-03 DIAGNOSIS — Z6841 Body Mass Index (BMI) 40.0 and over, adult: Secondary | ICD-10-CM | POA: Diagnosis not present

## 2017-11-09 NOTE — Progress Notes (Signed)
Cardiology Office Note  Date: 11/10/2017   ID: Christy [name], DOB 07-Mar-1950, MRN [id number]  PCP: Monico Blitz, MD  Primary Cardiologist: Rozann Lesches, MD   Chief Complaint  Patient presents with  . Hypertension    History of Present Illness: Christy [name] is a 67 y.o. female last seen in August.  She is here for a follow-up visit.  She tells me that her mother passed away at age 31 back at the end of September.  She has been under stress dealing with the estate.  Still working with the city Agricultural consultant.  Her blood pressure remains elevated, I went over her home blood pressure checks.  She also feels more of a sense of shortness of breath and her weight has steadily climbed over the last several months, she is up 5 pounds from the last visit.  At the last visit she was taken off Toprol-XL with resumption of labetalol and otherwise continued on Norvasc and Demadex.  Labetalol has been increased to 300 mg twice daily in the interim.  Her heart rate is high 50s to low 60s.  Past Medical History:  Diagnosis Date  . Bipolar disorder ([place])   . Coronary atherosclerosis of native coronary artery    BMS circ 2006, 70% PDA 2009  . Essential hypertension   . Mixed hyperlipidemia   . OSA on CPAP   . Renal tubular acidosis, type IV   . SVT (supraventricular tachycardia) ([place])   . Type 2 diabetes mellitus ([place])     Past Surgical History:  Procedure Laterality Date  . Acromioclavicular arthritis    . CARDIAC CATHETERIZATION N/A 03/29/2015   Procedure: Left Heart Cath and Coronary Angiography;  Surgeon: Troy Sine, MD;  Location: [facility];  Service: Cardiovascular;  Laterality: N/A;  . RIght shoulder adhesive capsulitis    . Rotator cuff impingement syndrome    . Rotator cuff tear      Current Outpatient Medications  Medication Sig Dispense Refill  . allopurinol (ZYLOPRIM) 300 MG tablet Take 150 mg by mouth daily.    Marland Kitchen amLODipine (NORVASC) 10 MG tablet Take 10 mg by  mouth daily.    Marland Kitchen aspirin 81 MG EC tablet Take 81 mg by mouth daily.      Marland Kitchen buPROPion (WELLBUTRIN XL) 300 MG 24 hr tablet Take 300 mg by mouth daily.      . calcium carbonate (OS-CAL) 600 MG TABS Take 600 mg by mouth daily.      . Cholecalciferol (VITAMIN D) 400 UNITS capsule Take 400 Units by mouth daily.      . divalproex (DEPAKOTE) 250 MG EC tablet Take 250 mg by mouth daily.      . isosorbide mononitrate (IMDUR) 60 MG 24 hr tablet Take 1.5 tablets (90 mg total) by mouth daily. 45 tablet 6  . labetalol (NORMODYNE) 300 MG tablet Take 1 tablet (300 mg total) by mouth 2 (two) times daily. 180 tablet 1  . Liraglutide (VICTOZA) 18 MG/3ML SOLN Inject 1.2 mg into the skin daily.     . Multiple Vitamin (MULTIVITAMIN) tablet Take 1 tablet by mouth daily.      . nitroGLYCERIN (NITROLINGUAL) 0.4 MG/SPRAY spray Place 1 spray under the tongue every 5 (five) minutes x 3 doses as needed. 12 g 2  . POLYETHYLENE GLYCOL 3350 PO Take by mouth as needed.     . rosuvastatin (CRESTOR) 10 MG tablet Take 10 mg by mouth daily.    . sertraline (  ZOLOFT) 100 MG tablet Take 100 mg by mouth daily.      Marland Kitchen torsemide (DEMADEX) 20 MG tablet Take 3 tablets (60 mg total) by mouth every morning. 90 tablet 6  . traZODone (DESYREL) 100 MG tablet TAKE ONE TAB BY MOUTH AT BEDTIME  3  . TRESIBA FLEXTOUCH 200 UNIT/ML SOPN Inject 38 Units into the skin every morning.  2   No current facility-administered medications for this visit.    Allergies:  Ace inhibitors; Lisinopril; Penicillins; Tetracycline; and Lamictal [lamotrigine]   Social History: The patient  reports that she quit smoking about 4 years ago. Her smoking use included cigarettes. She started smoking about 33 years ago. She has a 20.00 pack-year smoking history. She has never used smokeless tobacco. She reports that she does not drink alcohol or use drugs.   ROS:  Please see the history of present illness. Otherwise, complete review of systems is positive for none.  All  other systems are reviewed and negative.   Physical Exam: VS:  BP (!) 160/60   Pulse (!) 58   Ht 5\' 3"  (1.6 m)   Wt 234 lb (106.1 kg)   SpO2 93%   BMI 41.45 kg/m , BMI Body mass index is 41.45 kg/m.  Wt Readings from Last 3 Encounters:  11/10/17 234 lb (106.1 kg)  09/07/17 229 lb (103.9 kg)  05/24/17 223 lb 6.4 oz (101.3 kg)    General: Patient appears comfortable at rest. HEENT: Conjunctiva and lids normal, oropharynx clear. Neck: Supple, no elevated JVP or carotid bruits, no thyromegaly. Lungs: Clear to auscultation, nonlabored breathing at rest. Cardiac: Regular rate and rhythm, no S3, 2/6 systolic murmur. Abdomen: Soft, nontender, bowel sounds present. Extremities: No pitting edema, distal pulses 2+. Skin: Warm and dry. Musculoskeletal: No kyphosis. Neuropsychiatric: Alert and oriented x3, affect grossly appropriate.  ECG: I personally reviewed the tracing from 09/07/2017 which showed sinus rhythm with poor R wave progression.  Recent Labwork: 05/03/2017: ALT 16; AST 19; BUN 45; Creatinine, Ser 1.34; Hemoglobin 11.4; Platelets 194; Potassium 5.2; Sodium 139   Other Studies Reviewed Today:  Cardiac catheterization 03/29/2015: No significant coronary obstructive disease with evidence for widely patent stent in the proximal left circumflex artery, a normal LAD and a normal very large dominant RCA.  Systemic hypertension with elevated left ventricular end-diastolic pressure at 37 mm.  Echocardiogram 03/13/2015: Study Conclusions  - Left ventricle: The cavity size was normal. Wall thickness was increased in a pattern of mild LVH. Systolic function was normal. The estimated ejection fraction was in the range of 60% to 65%. Wall motion was normal; there were no regional wall motion abnormalities. Doppler parameters are consistent with restrictive physiology, indicative of decreased left ventricular diastolic compliance and/or increased left atrial pressure. -  Aortic valve: There was mild regurgitation. Valve area (VTI): 2.84 cm^2. Valve area (Vmax): 2.77 cm^2. Valve area (Vmean): 2.57 cm^2. - Mitral valve: Mildly calcified annulus. Mildly thickened leaflets . There was mild regurgitation. - Left atrium: The atrium was severely dilated. - Right atrium: The atrium was mildly dilated. - Pulmonary arteries: Systolic pressure was mildly increased. PA peak pressure: 37 mm Hg (S). - Technically difficult study.  Assessment and Plan:  1.  Essential hypertension.  Plan is to continue with present regimen although increase Demadex to 60 mg in the morning with follow-up BME T in 2 weeks.  She has had a gradual increase in weight and with chronic diastolic heart failure this is likely contributing to her blood pressure  as well.  2.  Chronic diastolic heart failure, increasing Demadex as outlined above.  3.  CAD status post BMS to the circumflex in 2006.  She had widely patent stent site and no progressive disease by cardiac catheterization in 2016.  Continue aspirin and statin.  4.  History of SVT, no palpitations.  Current medicines were reviewed with the patient today.   Orders Placed This Encounter  Procedures  . Basic metabolic panel    Disposition: Follow-up in 6 weeks.   Signed, Satira Sark, MD, [facility] 11/10/2017 2:37 PM    [facility] [place], [place], [place] [address] Phone: [phone]; Fax: [phone]

## 2017-11-10 ENCOUNTER — Encounter: Payer: Self-pay | Admitting: Cardiology

## 2017-11-10 ENCOUNTER — Ambulatory Visit (INDEPENDENT_AMBULATORY_CARE_PROVIDER_SITE_OTHER): Payer: Medicare Other | Admitting: Cardiology

## 2017-11-10 VITALS — BP 160/60 | HR 58 | Ht 63.0 in | Wt 234.0 lb

## 2017-11-10 DIAGNOSIS — I5032 Chronic diastolic (congestive) heart failure: Secondary | ICD-10-CM

## 2017-11-10 DIAGNOSIS — I471 Supraventricular tachycardia: Secondary | ICD-10-CM | POA: Diagnosis not present

## 2017-11-10 DIAGNOSIS — I2510 Atherosclerotic heart disease of native coronary artery without angina pectoris: Secondary | ICD-10-CM

## 2017-11-10 DIAGNOSIS — I10 Essential (primary) hypertension: Secondary | ICD-10-CM | POA: Diagnosis not present

## 2017-11-10 MED ORDER — TORSEMIDE 20 MG PO TABS: 60.0000 mg | ORAL_TABLET | Freq: Every morning | ORAL | 6 refills | Status: DC

## 2017-11-10 NOTE — Patient Instructions (Addendum)
Medication Instructions:   Your physician has recommended you make the following change in your medication:   Increase torsemide to 60 mg in the morning (3 tablets).  Continue all other medications the same.  Labwork:  Your physician recommends that you return for lab work in: 2 weeks to check your BMET.  Testing/Procedures:  NONE  Follow-Up:  Your physician recommends that you schedule a follow-up appointment in: 6 weeks.  Any Other Special Instructions Will Be Listed Below (If Applicable).  If you need a refill on your cardiac medications before your next appointment, please call your pharmacy.

## 2017-11-16 DIAGNOSIS — I129 Hypertensive chronic kidney disease with stage 1 through stage 4 chronic kidney disease, or unspecified chronic kidney disease: Secondary | ICD-10-CM | POA: Diagnosis not present

## 2017-11-16 DIAGNOSIS — D509 Iron deficiency anemia, unspecified: Secondary | ICD-10-CM | POA: Diagnosis not present

## 2017-11-16 DIAGNOSIS — N183 Chronic kidney disease, stage 3 (moderate): Secondary | ICD-10-CM | POA: Diagnosis not present

## 2017-11-16 DIAGNOSIS — R809 Proteinuria, unspecified: Secondary | ICD-10-CM | POA: Diagnosis not present

## 2017-11-16 DIAGNOSIS — Z79899 Other long term (current) drug therapy: Secondary | ICD-10-CM | POA: Diagnosis not present

## 2017-11-16 DIAGNOSIS — E559 Vitamin D deficiency, unspecified: Secondary | ICD-10-CM | POA: Diagnosis not present

## 2017-11-17 DIAGNOSIS — F411 Generalized anxiety disorder: Secondary | ICD-10-CM | POA: Diagnosis not present

## 2017-11-17 DIAGNOSIS — F4310 Post-traumatic stress disorder, unspecified: Secondary | ICD-10-CM | POA: Diagnosis not present

## 2017-11-17 DIAGNOSIS — F3112 Bipolar disorder, current episode manic without psychotic features, moderate: Secondary | ICD-10-CM | POA: Diagnosis not present

## 2017-11-24 ENCOUNTER — Inpatient Hospital Stay (HOSPITAL_COMMUNITY): Payer: Medicare Other | Attending: Hematology

## 2017-11-24 DIAGNOSIS — Z79899 Other long term (current) drug therapy: Secondary | ICD-10-CM | POA: Diagnosis not present

## 2017-11-24 DIAGNOSIS — I130 Hypertensive heart and chronic kidney disease with heart failure and stage 1 through stage 4 chronic kidney disease, or unspecified chronic kidney disease: Secondary | ICD-10-CM | POA: Diagnosis not present

## 2017-11-24 DIAGNOSIS — N184 Chronic kidney disease, stage 4 (severe): Secondary | ICD-10-CM | POA: Insufficient documentation

## 2017-11-24 DIAGNOSIS — D472 Monoclonal gammopathy: Secondary | ICD-10-CM | POA: Insufficient documentation

## 2017-11-24 DIAGNOSIS — E1122 Type 2 diabetes mellitus with diabetic chronic kidney disease: Secondary | ICD-10-CM | POA: Insufficient documentation

## 2017-11-24 DIAGNOSIS — D649 Anemia, unspecified: Secondary | ICD-10-CM | POA: Insufficient documentation

## 2017-11-24 DIAGNOSIS — E875 Hyperkalemia: Secondary | ICD-10-CM | POA: Diagnosis not present

## 2017-11-24 LAB — COMPREHENSIVE METABOLIC PANEL
ALT: 18 U/L (ref 0–44)
AST: 19 U/L (ref 15–41)
Albumin: 4 g/dL (ref 3.5–5.0)
Alkaline Phosphatase: 49 U/L (ref 38–126)
Anion gap: 8 (ref 5–15)
BILIRUBIN TOTAL: 0.7 mg/dL (ref 0.3–1.2)
BUN: 58 mg/dL — ABNORMAL HIGH (ref 8–23)
CALCIUM: 8.9 mg/dL (ref 8.9–10.3)
CO2: 26 mmol/L (ref 22–32)
CREATININE: 1.76 mg/dL — AB (ref 0.44–1.00)
Chloride: 105 mmol/L (ref 98–111)
GFR calc non Af Amer: 29 mL/min — ABNORMAL LOW (ref >60)
GFR, EST AFRICAN AMERICAN: 33 mL/min — AB (ref >60)
Glucose, Bld: 136 mg/dL — ABNORMAL HIGH (ref 70–99)
Potassium: 5.1 mmol/L (ref 3.5–5.1)
Sodium: 139 mmol/L (ref 135–145)
TOTAL PROTEIN: 7.2 g/dL (ref 6.5–8.1)

## 2017-11-24 LAB — CBC WITH DIFFERENTIAL/PLATELET
ABS IMMATURE GRANULOCYTES: 0.03 10*3/uL (ref 0.00–0.07)
Basophils Absolute: 0.1 10*3/uL (ref 0.0–0.1)
Basophils Relative: 1 %
EOS ABS: 0.5 10*3/uL (ref 0.0–0.5)
Eosinophils Relative: 7 %
HEMATOCRIT: 33.4 % — AB (ref 36.0–46.0)
Hemoglobin: 10.1 g/dL — ABNORMAL LOW (ref 12.0–15.0)
IMMATURE GRANULOCYTES: 0 %
LYMPHS ABS: 2.1 10*3/uL (ref 0.7–4.0)
Lymphocytes Relative: 25 %
MCH: 29.7 pg (ref 26.0–34.0)
MCHC: 30.2 g/dL (ref 30.0–36.0)
MCV: 98.2 fL (ref 80.0–100.0)
MONO ABS: 0.5 10*3/uL (ref 0.1–1.0)
MONOS PCT: 6 %
NEUTROS ABS: 5.2 10*3/uL (ref 1.7–7.7)
Neutrophils Relative %: 61 %
Platelets: 140 10*3/uL — ABNORMAL LOW (ref 150–400)
RBC: 3.4 MIL/uL — ABNORMAL LOW (ref 3.87–5.11)
RDW: 13.6 % (ref 11.5–15.5)
WBC: 8.4 10*3/uL (ref 4.0–10.5)
nRBC: 0 % (ref 0.0–0.2)

## 2017-11-24 LAB — FERRITIN: FERRITIN: 42 ng/mL (ref 11–307)

## 2017-11-24 LAB — LACTATE DEHYDROGENASE: LDH: 216 U/L — AB (ref 98–192)

## 2017-11-25 DIAGNOSIS — L609 Nail disorder, unspecified: Secondary | ICD-10-CM | POA: Diagnosis not present

## 2017-11-25 DIAGNOSIS — L110 Acquired keratosis follicularis: Secondary | ICD-10-CM | POA: Diagnosis not present

## 2017-11-25 DIAGNOSIS — I10 Essential (primary) hypertension: Secondary | ICD-10-CM | POA: Diagnosis not present

## 2017-11-25 DIAGNOSIS — E1140 Type 2 diabetes mellitus with diabetic neuropathy, unspecified: Secondary | ICD-10-CM | POA: Diagnosis not present

## 2017-11-25 LAB — IGG, IGA, IGM
IGA: 137 mg/dL (ref 87–352)
IGG (IMMUNOGLOBIN G), SERUM: 1180 mg/dL (ref 700–1600)
IgM (Immunoglobulin M), Srm: 218 mg/dL — ABNORMAL HIGH (ref 26–217)

## 2017-11-25 LAB — BETA 2 MICROGLOBULIN, SERUM: Beta-2 Microglobulin: 4 mg/L — ABNORMAL HIGH (ref 0.6–2.4)

## 2017-11-26 ENCOUNTER — Telehealth: Payer: Self-pay | Admitting: *Deleted

## 2017-11-26 DIAGNOSIS — I5032 Chronic diastolic (congestive) heart failure: Secondary | ICD-10-CM

## 2017-11-26 DIAGNOSIS — I10 Essential (primary) hypertension: Secondary | ICD-10-CM

## 2017-11-26 LAB — PROTEIN ELECTROPHORESIS, SERUM
A/G RATIO SPE: 1.2 (ref 0.7–1.7)
ALPHA-2-GLOBULIN: 0.6 g/dL (ref 0.4–1.0)
Albumin ELP: 3.8 g/dL (ref 2.9–4.4)
Alpha-1-Globulin: 0.2 g/dL (ref 0.0–0.4)
Beta Globulin: 1 g/dL (ref 0.7–1.3)
Gamma Globulin: 1.3 g/dL (ref 0.4–1.8)
Globulin, Total: 3.1 g/dL (ref 2.2–3.9)
M-Spike, %: 0.5 g/dL — ABNORMAL HIGH
Total Protein ELP: 6.9 g/dL (ref 6.0–8.5)

## 2017-11-26 NOTE — Telephone Encounter (Signed)
Patient informed and verbalized understanding of plan. Lab order faxed to [facility]. Copy sent to PCP.

## 2017-11-26 NOTE — Telephone Encounter (Signed)
-----   Message from Satira Sark, MD sent at 11/25/2017  3:50 PM EST ----- Results reviewed.  Creatinine has increased from 1.3-1.5 although potassium remains normal.  Continue with current medications, would reassess BMET at follow-up as well. A copy of this test should be forwarded to Monico Blitz, MD.

## 2017-12-01 ENCOUNTER — Other Ambulatory Visit: Payer: Self-pay

## 2017-12-01 ENCOUNTER — Encounter (HOSPITAL_COMMUNITY): Payer: Self-pay | Admitting: Internal Medicine

## 2017-12-01 ENCOUNTER — Inpatient Hospital Stay (HOSPITAL_BASED_OUTPATIENT_CLINIC_OR_DEPARTMENT_OTHER): Payer: Medicare Other | Admitting: Internal Medicine

## 2017-12-01 VITALS — BP 137/46 | HR 54 | Temp 97.7°F | Resp 20 | Wt 232.0 lb

## 2017-12-01 DIAGNOSIS — E1122 Type 2 diabetes mellitus with diabetic chronic kidney disease: Secondary | ICD-10-CM

## 2017-12-01 DIAGNOSIS — D649 Anemia, unspecified: Secondary | ICD-10-CM

## 2017-12-01 DIAGNOSIS — Z79899 Other long term (current) drug therapy: Secondary | ICD-10-CM | POA: Diagnosis not present

## 2017-12-01 DIAGNOSIS — D472 Monoclonal gammopathy: Secondary | ICD-10-CM

## 2017-12-01 DIAGNOSIS — N184 Chronic kidney disease, stage 4 (severe): Secondary | ICD-10-CM | POA: Diagnosis not present

## 2017-12-01 DIAGNOSIS — I130 Hypertensive heart and chronic kidney disease with heart failure and stage 1 through stage 4 chronic kidney disease, or unspecified chronic kidney disease: Secondary | ICD-10-CM | POA: Diagnosis not present

## 2017-12-01 DIAGNOSIS — E875 Hyperkalemia: Secondary | ICD-10-CM

## 2017-12-01 NOTE — Patient Instructions (Signed)
[facility] at [facility] Discharge Instructions  Today you saw Dr. Higgs   Thank you for choosing [facility] at [facility] to provide your oncology and hematology care.  To afford each patient quality time with our provider, please arrive at least 15 minutes before your scheduled appointment time.   If you have a lab appointment with the Cancer Center please come in thru the  Main Entrance and check in at the main information desk  You need to re-schedule your appointment should you arrive 10 or more minutes late.  We strive to give you quality time with our providers, and arriving late affects you and other patients whose appointments are after yours.  Also, if you no show three or more times for appointments you may be dismissed from the clinic at the providers discretion.     Again, thank you for choosing [facility].  Our hope is that these requests will decrease the amount of time that you wait before being seen by our physicians.       _____________________________________________________________  Should you have questions after your visit to [facility], please contact our office at [phone] between the hours of 8:00 a.m. and 4:30 p.m.  Voicemails left after 4:00 p.m. will not be returned until the following business day.  For prescription refill requests, have your pharmacy contact our office and allow 72 hours.    Cancer Center Support Programs:   > Cancer Support Group  2nd Tuesday of the month 1pm-2pm, Journey Room   

## 2017-12-01 NOTE — Progress Notes (Signed)
Diagnosis MGUS (monoclonal gammopathy of unknown significance) - Plan: CBC with Differential/Platelet, Comprehensive metabolic panel, Lactate dehydrogenase, Protein electrophoresis, serum, Ferritin, IgG, IgA, IgM, Kappa/lambda light chains  Staging Cancer Staging No matching staging information was found for the patient.  Assessment and Plan:  1.   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  Labs done 11/26/2017 reviewed and showed WBC 8.4 HB 10.1 plts 140,000.  Chemistries shows K+ 5.1 Cr 1.76 and normal LFTs.  Calcium WNL at 8.9.  SPEP Stable with M spike of 0.5 g/dl  Quantitative IG WNL.  FLC ratio has been stable at 1.  Pt will RTC in 05/2018 for follow-up and labs.  Will obtain results of labs from nephrology appointment in December.  If any significant change in numbers, may recommend further workup with x-rays and possible bone marrow biopsy as she did not undergo BM biopsy in the past.   2.  Anemia.  I discussed with her HB decreased from labs done in 04/2017 and is 10.  Cr is also increased at 1.76.  She reports she is due for labs in December 2019.  Will obtain results for review.  Pt will RTC in 05/2018 for follow-up and repeat labs.    3.  RI.  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 I discussed with her Cr has increased a 1.76.  She reports she is scheduled to have labs in December 2019.  Will follow-up results.  If any significant change in numbers, may recommend further workup with x-rays and possible bone marrow biopsy as she did not undergo BM biopsy in the past.  Pt should follow-up with nephrology.    4.  Hyperkalemia.  K= is 5.1 on labs done 11/24/2017.  Pt should follow-up with nephrology for ongoing monitoring due to RI.    5    HTN.  BP is 137/46.  Follow-up with PCP.    6.  Health maintenance.  Pt has not had mammogram and is recommended for mammogram  evaluation.  She reports mammogram is done at [facility] so she will contact the hospital to schedule.  GI follow-up as recommended.    Interval history:  Historical data obtained from noted dated 05/24/2017.  67 yr old female with  hypertension, dyslipidemia, coronary artery disease status post PCI, obstructive sleep apnea on CPAP, diabetes type 2, CKD stage 4, RTA type IV and bipolar disorder who was noted to have an elevation in her creatinine and was seen by nephrology in November 2014 and had a SPEP done as a part of the workup for her kidney disease and was noted to have an M spike of 0.6 with normal Lambda Light Chain Ratio. Immunofixation showed an IgG kappa monoclonal protein. Patient had hepatitis C antibody which was negative, ANA negative, C ANCA negative, p-ANCA negative, anti-double-stranded DNA antibody negative and complements were within normal limits. Her creatinine at that time was 1.32. Patient reports that increase potassium levels were concerned that initially triggered her nephrology evaluation. Patient had a hemoglobin of 11.8 at the time and no significant protein in the urine. Patient reports she got a bone scan which was noted to be negative. She was not recommended a bone marrow examination based on no other acute concerns regarding multiple myeloma.  Patient was being followed with Dr Tressie Stalker and was last seen in 01/2015 when her M-spike was 0.7 and did not show any significant  increase. Labs in July 2017 showed a hemoglobin of 10.4 and a urine total protein creatinine ratio 0.2. Creatinine has been gradually increasing over the last couple of years and is now up to 2 with BUN of 60 suggesting some element of dehydration. 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 She has had history of some hyperkalemia thought to be related to type IV RTA from her diabetes and possibly from Ace inhibitors which were  discontinued.   Current Status:  Pt is seen today for follow-up.  She is here to go over labs.  She is followed by nephrology and reports she has an appointment in December with them.     Problem List Patient Active Problem List   Diagnosis Date Noted  . Essential hypertension [I10] 04/10/2015  . Abnormal myocardial perfusion study [R94.39]   . SVT (supraventricular tachycardia) ([place]) [I47.1] 01/06/2012  . Mixed hyperlipidemia [E78.2] 01/06/2012  . Atypical chest pain [R07.89]   . CHRONIC DIASTOLIC HEART FAILURE [E17.40] 11/22/2009  . OBSTRUCTIVE SLEEP APNEA [G47.33] 12/14/2007  . CORONARY ATHEROSCLEROSIS NATIVE CORONARY ARTERY [I25.10] 12/14/2007    Past Medical History Past Medical History:  Diagnosis Date  . Bipolar disorder ([place])   . Coronary atherosclerosis of native coronary artery    BMS circ 2006, 70% PDA 2009  . Essential hypertension   . Mixed hyperlipidemia   . OSA on CPAP   . Renal tubular acidosis, type IV   . SVT (supraventricular tachycardia) ([place])   . Type 2 diabetes mellitus [facility])     Past Surgical History Past Surgical History:  Procedure Laterality Date  . Acromioclavicular arthritis    . CARDIAC CATHETERIZATION N/A 03/29/2015   Procedure: Left Heart Cath and Coronary Angiography;  Surgeon: Troy Sine, MD;  Location: [place] [facility];  Service: Cardiovascular;  Laterality: N/A;  . RIght shoulder adhesive capsulitis    . Rotator cuff impingement syndrome    . Rotator cuff tear      Family History Family History  Problem Relation Age of Onset  . Bipolar disorder Brother   . Diabetes Father      Social History  reports that she quit smoking about 4 years ago. Her smoking use included cigarettes. She started smoking about 33 years ago. She has a 20.00 pack-year smoking history. She has never used smokeless tobacco. She reports that she does not drink alcohol or use drugs.  Medications  Current Outpatient Medications:  .  allopurinol  (ZYLOPRIM) 300 MG tablet, Take 150 mg by mouth daily., Disp: , Rfl:  .  amLODipine (NORVASC) 10 MG tablet, Take 10 mg by mouth daily., Disp: , Rfl:  .  aspirin 81 MG EC tablet, Take 81 mg by mouth daily.  , Disp: , Rfl:  .  buPROPion (WELLBUTRIN XL) 300 MG 24 hr tablet, Take 300 mg by mouth daily., Disp: , Rfl:  .  calcium carbonate (OS-CAL) 600 MG TABS, Take 600 mg by mouth daily.  , Disp: , Rfl:  .  Cholecalciferol (VITAMIN D) 400 UNITS capsule, Take 400 Units by mouth daily.  , Disp: , Rfl:  .  diltiazem (CARDIZEM) 120 MG tablet, Take by mouth., Disp: , Rfl:  .  divalproex (DEPAKOTE) 250 MG EC tablet, Take 250 mg by mouth daily.  , Disp: , Rfl:  .  insulin aspart (NOVOLOG) 100 UNIT/ML injection, Inject into the skin. 6 units before bedtime meal, Disp: ,  Rfl:  .  isosorbide mononitrate (IMDUR) 60 MG 24 hr tablet, Take 1.5 tablets (90 mg total) by mouth daily., Disp: 45 tablet, Rfl: 6 .  labetalol (NORMODYNE) 300 MG tablet, Take 1 tablet (300 mg total) by mouth 2 (two) times daily., Disp: 180 tablet, Rfl: 1 .  Liraglutide (VICTOZA) 18 MG/3ML SOLN, Inject 1.2 mg into the skin daily. , Disp: , Rfl:  .  Multiple Vitamin (MULTIVITAMIN) tablet, Take 1 tablet by mouth daily.  , Disp: , Rfl:  .  rosuvastatin (CRESTOR) 10 MG tablet, Take 10 mg by mouth daily., Disp: , Rfl:  .  sertraline (ZOLOFT) 100 MG tablet, Take 100 mg by mouth daily.  , Disp: , Rfl:  .  torsemide (DEMADEX) 20 MG tablet, Take 3 tablets (60 mg total) by mouth every morning., Disp: 90 tablet, Rfl: 6 .  traZODone (DESYREL) 100 MG tablet, TAKE ONE TAB BY MOUTH AT BEDTIME, Disp: , Rfl: 3 .  TRESIBA FLEXTOUCH 200 UNIT/ML SOPN, Inject 62 Units into the skin every morning. , Disp: , Rfl: 2 .  BESIVANCE 0.6 % SUSP, INSTILL ONE DROP IN THE LEFT EYE FOUR TIMES DAILY FOR 2 DAYS AFTER EACH MONTHLY EYE INJECTION, Disp: , Rfl: 12 .  nitroGLYCERIN (NITROLINGUAL) 0.4 MG/SPRAY spray, Place 1 spray under the tongue every 5 (five) minutes x 3 doses as  needed. (Patient not taking: Reported on 12/01/2017), Disp: 12 g, Rfl: 2  Allergies Ace inhibitors; Lisinopril; Penicillins; Tetracycline; and Lamictal [lamotrigine]  Review of Systems Review of Systems - Oncology ROS negative   Physical Exam  Vitals Wt Readings from Last 3 Encounters:  12/01/17 232 lb (105.2 kg)  11/10/17 234 lb (106.1 kg)  09/07/17 229 lb (103.9 kg)   Temp Readings from Last 3 Encounters:  12/01/17 97.7 F (36.5 C) (Oral)  05/24/17 98.7 F (37.1 C) (Oral)  05/07/16 98.3 F (36.8 C) (Oral)   BP Readings from Last 3 Encounters:  12/01/17 (!) 137/46  11/10/17 (!) 160/60  09/23/17 (!) 148/75   Pulse Readings from Last 3 Encounters:  12/01/17 (!) 54  11/10/17 (!) 58  09/23/17 (!) 53   Constitutional: Well-developed, well-nourished, and in no distress.   HENT: Head: Normocephalic and atraumatic.  Mouth/Throat: No oropharyngeal exudate. Mucosa moist. Eyes: Pupils are equal, round, and reactive to light. Conjunctivae are normal. No scleral icterus.  Neck: Normal range of motion. Neck supple. No JVD present.  Cardiovascular: Normal rate, regular rhythm and normal heart sounds.  Exam reveals no gallop and no friction rub.   No murmur heard. Pulmonary/Chest: Effort normal and breath sounds normal. No respiratory distress. No wheezes.No rales.  Abdominal: Soft. Bowel sounds are normal. No distension. There is no tenderness. There is no guarding.  Musculoskeletal: No edema or tenderness.  Lymphadenopathy: No cervical, axillary or supraclavicular adenopathy.  Neurological: Alert and oriented to person, place, and time. No cranial nerve deficit.  Skin: Skin is warm and dry. No rash noted. No erythema. No pallor.  Psychiatric: Affect and judgment normal.   Labs No visits with results within 3 Day(s) from this visit.  Latest known visit with results is:  Appointment on 11/24/2017  Component Date Value Ref Range Status  . WBC 11/24/2017 8.4  4.0 - 10.5 K/uL  Final  . RBC 11/24/2017 3.40* 3.87 - 5.11 MIL/uL Final  . Hemoglobin 11/24/2017 10.1* 12.0 - 15.0 g/dL Final  . HCT 11/24/2017 33.4* 36.0 - 46.0 % Final  . MCV 11/24/2017 98.2  80.0 - 100.0 fL Final  .  MCH 11/24/2017 29.7  26.0 - 34.0 pg Final  . MCHC 11/24/2017 30.2  30.0 - 36.0 g/dL Final  . RDW 11/24/2017 13.6  11.5 - 15.5 % Final  . Platelets 11/24/2017 140* 150 - 400 K/uL Final  . nRBC 11/24/2017 0.0  0.0 - 0.2 % Final  . Neutrophils Relative % 11/24/2017 61  % Final  . Neutro Abs 11/24/2017 5.2  1.7 - 7.7 K/uL Final  . Lymphocytes Relative 11/24/2017 25  % Final  . Lymphs Abs 11/24/2017 2.1  0.7 - 4.0 K/uL Final  . Monocytes Relative 11/24/2017 6  % Final  . Monocytes Absolute 11/24/2017 0.5  0.1 - 1.0 K/uL Final  . Eosinophils Relative 11/24/2017 7  % Final  . Eosinophils Absolute 11/24/2017 0.5  0.0 - 0.5 K/uL Final  . Basophils Relative 11/24/2017 1  % Final  . Basophils Absolute 11/24/2017 0.1  0.0 - 0.1 K/uL Final  . Immature Granulocytes 11/24/2017 0  % Final  . Abs Immature Granulocytes 11/24/2017 0.03  0.00 - 0.07 K/uL Final   Performed at [facility], [address] [place], [place] [address]  . Sodium 11/24/2017 139  135 - 145 mmol/L Final  . Potassium 11/24/2017 5.1  3.5 - 5.1 mmol/L Final  . Chloride 11/24/2017 105  98 - 111 mmol/L Final  . CO2 11/24/2017 26  22 - 32 mmol/L Final  . Glucose, Bld 11/24/2017 136* 70 - 99 mg/dL Final  . BUN 11/24/2017 58* 8 - 23 mg/dL Final  . Creatinine, Ser 11/24/2017 1.76* 0.44 - 1.00 mg/dL Final  . Calcium 11/24/2017 8.9  8.9 - 10.3 mg/dL Final  . Total Protein 11/24/2017 7.2  6.5 - 8.1 g/dL Final  . Albumin 11/24/2017 4.0  3.5 - 5.0 g/dL Final  . AST 11/24/2017 19  15 - 41 U/L Final  . ALT 11/24/2017 18  0 - 44 U/L Final  . Alkaline Phosphatase 11/24/2017 49  38 - 126 U/L Final  . Total Bilirubin 11/24/2017 0.7  0.3 - 1.2 mg/dL Final  . GFR calc non Af Amer 11/24/2017 29* >60 mL/min Final  . GFR calc Af Amer 11/24/2017 33*  >60 mL/min Final   Comment: (NOTE) The eGFR has been calculated using the CKD EPI equation. This calculation has not been validated in all clinical situations. eGFR's persistently <60 mL/min signify possible Chronic Kidney Disease.   Georgiann Hahn gap 11/24/2017 8  5 - 15 Final   Performed at [facility], [address] [place], [place] [address]  . LDH 11/24/2017 216* 98 - 192 U/L Final   Performed at [facility], [address] [place], [place] [address]  . Total Protein ELP 11/24/2017 6.9  6.0 - 8.5 g/dL Final  . Albumin ELP 11/24/2017 3.8  2.9 - 4.4 g/dL Final  . Alpha-1-Globulin 11/24/2017 0.2  0.0 - 0.4 g/dL Final  . Alpha-2-Globulin 11/24/2017 0.6  0.4 - 1.0 g/dL Final  . Beta Globulin 11/24/2017 1.0  0.7 - 1.3 g/dL Final  . Gamma Globulin 11/24/2017 1.3  0.4 - 1.8 g/dL Final  . M-Spike, % 11/24/2017 0.5* Not Observed g/dL Final  . SPE Interp. 11/24/2017 Comment   Final   Comment: (NOTE) The SPE pattern demonstrates a single peak (M-spike) in the gamma region which may represent monoclonal protein. This peak may also be caused by circulating immune complexes, cryoglobulins, C-reactive protein, fibrinogen or hemolysis.  If clinically indicated, the presence of a monoclonal gammopathy may be confirmed by immuno- fixation, as well as  an evaluation of the urine for the presence of Bence-Jones protein. Performed At: [facility] [place], [place] [id number] Rush Farmer MD [id number]   . Comment 11/24/2017 Comment   Final   Comment: (NOTE) Protein electrophoresis scan will follow via computer, mail, or courier delivery.   Marland Kitchen GLOBULIN, TOTAL 11/24/2017 3.1  2.2 - 3.9 g/dL Corrected  . A/G Ratio 11/24/2017 1.2  0.7 - 1.7 Corrected  . Beta-2 Microglobulin 11/24/2017 4.0* 0.6 - 2.4 mg/L Final   Comment: (NOTE) Siemens Immulite 2000 Immunochemiluminometric assay (ICMA) Values obtained with different assay methods or kits cannot be used interchangeably.  Results cannot be interpreted as absolute evidence of the presence or absence of malignant disease. Performed At: [facility] [place], [place] [id number] Rush Farmer MD [id number]   . IgG (Immunoglobin G), Serum 11/24/2017 1,180  700 - 1,600 mg/dL Final  . IgA 11/24/2017 137  87 - 352 mg/dL Final  . IgM (Immunoglobulin M), Srm 11/24/2017 218* 26 - 217 mg/dL Final   Comment: (NOTE) Performed At: [facility] [place], [place] [id number] Rush Farmer MD [id number]   . Ferritin 11/24/2017 42  11 - 307 ng/mL Final   Performed at [facility], [address] [place], [place] [address]     Pathology Orders Placed This Encounter  Procedures  . CBC with Differential/Platelet    Standing Status:   Future    Standing Expiration Date:   12/02/2019  . Comprehensive metabolic panel    Standing Status:   Future    Standing Expiration Date:   12/02/2019  . Lactate dehydrogenase    Standing Status:   Future    Standing Expiration Date:   12/02/2019  . Protein electrophoresis, serum    Standing Status:   Future    Standing Expiration Date:   12/02/2019  . Ferritin    Standing Status:   Future    Standing Expiration Date:   12/02/2019  . IgG, IgA, IgM    Standing Status:   Future    Standing Expiration Date:   12/02/2019  . Kappa/lambda light chains    Standing Status:   Future    Standing Expiration Date:   12/02/2019       Zoila Shutter MD

## 2017-12-02 DIAGNOSIS — L03031 Cellulitis of right toe: Secondary | ICD-10-CM | POA: Diagnosis not present

## 2017-12-02 DIAGNOSIS — L600 Ingrowing nail: Secondary | ICD-10-CM | POA: Diagnosis not present

## 2017-12-02 DIAGNOSIS — M79674 Pain in right toe(s): Secondary | ICD-10-CM | POA: Diagnosis not present

## 2017-12-02 DIAGNOSIS — M79671 Pain in right foot: Secondary | ICD-10-CM | POA: Diagnosis not present

## 2017-12-14 DIAGNOSIS — E1151 Type 2 diabetes mellitus with diabetic peripheral angiopathy without gangrene: Secondary | ICD-10-CM | POA: Diagnosis not present

## 2017-12-14 DIAGNOSIS — E1140 Type 2 diabetes mellitus with diabetic neuropathy, unspecified: Secondary | ICD-10-CM | POA: Diagnosis not present

## 2017-12-14 DIAGNOSIS — L600 Ingrowing nail: Secondary | ICD-10-CM | POA: Diagnosis not present

## 2017-12-14 DIAGNOSIS — L03031 Cellulitis of right toe: Secondary | ICD-10-CM | POA: Diagnosis not present

## 2017-12-21 DIAGNOSIS — I509 Heart failure, unspecified: Secondary | ICD-10-CM | POA: Diagnosis not present

## 2017-12-21 DIAGNOSIS — I10 Essential (primary) hypertension: Secondary | ICD-10-CM | POA: Diagnosis not present

## 2017-12-21 DIAGNOSIS — R809 Proteinuria, unspecified: Secondary | ICD-10-CM | POA: Diagnosis not present

## 2017-12-21 DIAGNOSIS — E1129 Type 2 diabetes mellitus with other diabetic kidney complication: Secondary | ICD-10-CM | POA: Diagnosis not present

## 2017-12-21 DIAGNOSIS — N183 Chronic kidney disease, stage 3 (moderate): Secondary | ICD-10-CM | POA: Diagnosis not present

## 2017-12-21 DIAGNOSIS — E875 Hyperkalemia: Secondary | ICD-10-CM | POA: Diagnosis not present

## 2017-12-28 DIAGNOSIS — I5032 Chronic diastolic (congestive) heart failure: Secondary | ICD-10-CM | POA: Diagnosis not present

## 2017-12-28 DIAGNOSIS — I110 Hypertensive heart disease with heart failure: Secondary | ICD-10-CM | POA: Diagnosis not present

## 2017-12-29 DIAGNOSIS — E7800 Pure hypercholesterolemia, unspecified: Secondary | ICD-10-CM | POA: Diagnosis not present

## 2017-12-29 DIAGNOSIS — L03031 Cellulitis of right toe: Secondary | ICD-10-CM | POA: Diagnosis not present

## 2017-12-29 DIAGNOSIS — M79671 Pain in right foot: Secondary | ICD-10-CM | POA: Diagnosis not present

## 2017-12-29 DIAGNOSIS — I10 Essential (primary) hypertension: Secondary | ICD-10-CM | POA: Diagnosis not present

## 2017-12-29 DIAGNOSIS — I2510 Atherosclerotic heart disease of native coronary artery without angina pectoris: Secondary | ICD-10-CM | POA: Diagnosis not present

## 2017-12-29 DIAGNOSIS — M79674 Pain in right toe(s): Secondary | ICD-10-CM | POA: Diagnosis not present

## 2017-12-29 DIAGNOSIS — L600 Ingrowing nail: Secondary | ICD-10-CM | POA: Diagnosis not present

## 2017-12-29 DIAGNOSIS — E119 Type 2 diabetes mellitus without complications: Secondary | ICD-10-CM | POA: Diagnosis not present

## 2017-12-30 NOTE — Progress Notes (Signed)
Cardiology Office Note  Date: 12/31/2017   ID: Christy [name], DOB May 31, 1950, MRN [id number]  PCP: Christy Blitz, MD  Primary Cardiologist: Christy Lesches, MD   Chief Complaint  Patient presents with  . Coronary Artery Disease    History of Present Illness: Christy [name] is a 67 y.o. female last seen in October.  She is here for a routine visit.  We went over her home blood pressure and heart rate checks, systolics are still ranging 140s to 170s, averaging in the 150s mostly.  Shortness of breath is better after increasing Demadex to 60 mg daily.  Her follow-up creatinine was down to 1.3.  Today we went over her medications and discussed stopping Cardizem CD with concurrent increase in labetalol and the addition of hydralazine.  Past Medical History:  Diagnosis Date  . Bipolar disorder (Christy [name])   . Coronary atherosclerosis of native coronary artery    BMS circ 2006, 70% PDA 2009  . Essential hypertension   . Mixed hyperlipidemia   . OSA on CPAP   . Renal tubular acidosis, type IV   . SVT (supraventricular tachycardia) (Christy [name])   . Type 2 diabetes mellitus (Christy [name])     Past Surgical History:  Procedure Laterality Date  . Acromioclavicular arthritis    . CARDIAC CATHETERIZATION N/A 03/29/2015   Procedure: Left Heart Cath and Coronary Angiography;  Surgeon: Christy Sine, MD;  Location: [facility];  Service: Cardiovascular;  Laterality: N/A;  . RIght shoulder adhesive capsulitis    . Rotator cuff impingement syndrome    . Rotator cuff tear      Current Outpatient Medications  Medication Sig Dispense Refill  . allopurinol (ZYLOPRIM) 300 MG tablet Take 150 mg by mouth daily.    Marland Kitchen amLODipine (NORVASC) 10 MG tablet Take 10 mg by mouth daily.    Marland Kitchen aspirin 81 MG EC tablet Take 81 mg by mouth daily.      Marland Kitchen BESIVANCE 0.6 % SUSP INSTILL ONE DROP IN THE LEFT EYE FOUR TIMES DAILY FOR 2 DAYS AFTER EACH MONTHLY EYE INJECTION  12  . buPROPion (WELLBUTRIN XL) 300 MG 24 hr tablet  Take 300 mg by mouth daily.    . calcium carbonate (OS-CAL) 600 MG TABS Take 600 mg by mouth daily.      . Cholecalciferol (VITAMIN D) 400 UNITS capsule Take 400 Units by mouth daily.      . divalproex (DEPAKOTE) 250 MG EC tablet Take 250 mg by mouth daily.      . insulin aspart (NOVOLOG) 100 UNIT/ML injection Inject into the skin. 6 units before bedtime meal    . isosorbide mononitrate (IMDUR) 60 MG 24 hr tablet Take 1.5 tablets (90 mg total) by mouth daily. 45 tablet 6  . Liraglutide (VICTOZA) 18 MG/3ML SOLN Inject 1.2 mg into the skin daily.     . Multiple Vitamin (MULTIVITAMIN) tablet Take 1 tablet by mouth daily.      . nitroGLYCERIN (NITROLINGUAL) 0.4 MG/SPRAY spray Place 1 spray under the tongue every 5 (five) minutes x 3 doses as needed. 12 g 2  . rosuvastatin (CRESTOR) 10 MG tablet Take 10 mg by mouth daily.    . sertraline (ZOLOFT) 100 MG tablet Take 100 mg by mouth daily.      Marland Kitchen torsemide (DEMADEX) 20 MG tablet Take 3 tablets (60 mg total) by mouth every morning. 90 tablet 6  . traZODone (DESYREL) 100 MG tablet TAKE ONE TAB BY MOUTH AT BEDTIME  3  . TRESIBA FLEXTOUCH 200 UNIT/ML SOPN Inject 62 Units into the skin every morning.   2  . labetalol (NORMODYNE) 300 MG tablet Take 1 tablet by mouth 2 (two) times daily.     No current facility-administered medications for this visit.    Allergies:  Ace inhibitors; Lisinopril; Penicillins; Tetracycline; and Lamictal [lamotrigine]   Social History: The patient  reports that she quit smoking about 4 years ago. Her smoking use included cigarettes. She started smoking about 33 years ago. She has a 20.00 pack-year smoking history. She has never used smokeless tobacco. She reports that she does not drink alcohol or use drugs.   ROS:  Please see the history of present illness. Otherwise, complete review of systems is positive for none.  All other systems are reviewed and negative.   Physical Exam: VS:  BP (!) 164/68   Pulse 60   Ht 5\' 3"  (1.6  m)   Wt 227 lb 9.6 oz (103.2 kg)   SpO2 94%   BMI 40.32 kg/m , BMI Body mass index is 40.32 kg/m.  Wt Readings from Last 3 Encounters:  12/31/17 227 lb 9.6 oz (103.2 kg)  12/01/17 232 lb (105.2 kg)  11/10/17 234 lb (106.1 kg)    General: Patient appears comfortable at rest. HEENT: Conjunctiva and lids normal, oropharynx clear. Neck: Supple, no elevated JVP or carotid bruits, no thyromegaly. Lungs: Clear to auscultation, nonlabored breathing at rest. Cardiac: Regular rate and rhythm, no S3, 2/6 systolic murmur. Abdomen: Soft, nontender, bowel sounds present. Extremities: No pitting edema, distal pulses 2+. Skin: Warm and dry. Musculoskeletal: No kyphosis. Neuropsychiatric: Alert and oriented x3, affect grossly appropriate.  ECG: I personally reviewed the tracing from 09/07/2017 which showed sinus rhythm with poor R wave progression.  Recent Labwork: 11/24/2017: ALT 18; AST 19; BUN 58; Creatinine, Ser 1.76; Hemoglobin 10.1; Platelets 140; Potassium 5.1; Sodium 139  December 2019: BUN 43, creatinine 1.3, potassium 4.9  Other Studies Reviewed Today:  Cardiac catheterization 03/29/2015: No significant coronary obstructive disease with evidence for widely patent stent in the proximal left circumflex artery, a normal LAD and a normal very large dominant RCA.  Systemic hypertension with elevated left ventricular end-diastolic pressure at 37 mm.  Echocardiogram 03/13/2015: Study Conclusions  - Left ventricle: The cavity size was normal. Wall thickness was increased in a pattern of mild LVH. Systolic function was normal. The estimated ejection fraction was in the range of 60% to 65%. Wall motion was normal; there were no regional wall motion abnormalities. Doppler parameters are consistent with restrictive physiology, indicative of decreased left ventricular diastolic compliance and/or increased left atrial pressure. - Aortic valve: There was mild regurgitation. Valve  area (VTI): 2.84 cm^2. Valve area (Vmax): 2.77 cm^2. Valve area (Vmean): 2.57 cm^2. - Mitral valve: Mildly calcified annulus. Mildly thickened leaflets . There was mild regurgitation. - Left atrium: The atrium was severely dilated. - Right atrium: The atrium was mildly dilated. - Pulmonary arteries: Systolic pressure was mildly increased. PA peak pressure: 37 mm Hg (S). - Technically difficult study.  Assessment and Plan:  1.  Chronic diastolic heart failure.  Feels less short of breath since increasing Demadex and her creatinine is relatively stable.  No changes were made today.  2.  Essential hypertension.  Plan is to stop Cardizem CD, increase labetalol to 400 mg twice daily, and start hydralazine at 25 mg twice daily.  Nurse blood pressure check in 2 weeks.  3.  History of SVT, no obvious recurrences.  4.  CAD status post BMS to the circumflex in 2006, widely patent at cardiac catheterization in 2016.  She continues on aspirin and statin.  Current medicines were reviewed with the patient today.  Disposition: Follow-up in 3 months.  Signed, Satira Sark, MD, [facility] 12/31/2017 4:37 PM    [facility] [place], [place], [place] [address] Phone: [phone]; Fax: [phone]

## 2017-12-31 ENCOUNTER — Encounter: Payer: Self-pay | Admitting: Cardiology

## 2017-12-31 ENCOUNTER — Ambulatory Visit (INDEPENDENT_AMBULATORY_CARE_PROVIDER_SITE_OTHER): Payer: Medicare Other | Admitting: Cardiology

## 2017-12-31 VITALS — BP 164/68 | HR 60 | Ht 63.0 in | Wt 227.6 lb

## 2017-12-31 DIAGNOSIS — I471 Supraventricular tachycardia: Secondary | ICD-10-CM

## 2017-12-31 DIAGNOSIS — I2510 Atherosclerotic heart disease of native coronary artery without angina pectoris: Secondary | ICD-10-CM | POA: Diagnosis not present

## 2017-12-31 DIAGNOSIS — I10 Essential (primary) hypertension: Secondary | ICD-10-CM

## 2017-12-31 DIAGNOSIS — I5032 Chronic diastolic (congestive) heart failure: Secondary | ICD-10-CM

## 2017-12-31 MED ORDER — HYDRALAZINE HCL 25 MG PO TABS: 25.0000 mg | ORAL_TABLET | Freq: Two times a day (BID) | ORAL | 3 refills | Status: DC

## 2017-12-31 MED ORDER — LABETALOL HCL 100 MG PO TABS: 100.0000 mg | ORAL_TABLET | Freq: Two times a day (BID) | ORAL | 3 refills | Status: DC

## 2017-12-31 NOTE — Patient Instructions (Addendum)
Medication Instructions:   Your physician has recommended you make the following change in your medication:   Stop cardizem (diltiazem)  Increase labetalol to 400 mg by mouth twice daily (take a 300 mg tablet with a 100 mg tablet twice daily to equal 400 mg twice daily)  Start hydralazine 25 mg by mouth twice daily  Continue all other medications the same  Labwork:  NONE  Testing/Procedures:  NONE  Follow-Up:  Your physician recommends that you schedule a follow-up appointment in: 3 months.  Your physician recommends that you schedule a follow-up appointment in: 2 weeks for a nurse blood pressure check  Any Other Special Instructions Will Be Listed Below (If Applicable).  If you need a refill on your cardiac medications before your next appointment, please call your pharmacy.

## 2018-01-14 ENCOUNTER — Ambulatory Visit (INDEPENDENT_AMBULATORY_CARE_PROVIDER_SITE_OTHER): Payer: Medicare Other | Admitting: *Deleted

## 2018-01-14 VITALS — BP 148/60 | HR 57

## 2018-01-14 DIAGNOSIS — I10 Essential (primary) hypertension: Secondary | ICD-10-CM

## 2018-01-14 NOTE — Progress Notes (Signed)
Noted. Take BP in morning after medications.

## 2018-01-14 NOTE — Progress Notes (Signed)
Patient in office for nurse BP check.  Stated that she has taken all her meds for the day.  Typically takes them around 9:30 am.  She is questioning what is best time of day to take her readings.  See list below.  Most of her readings are done when she gets up & prior to any medications.    12/15 - 8:54 am - 163/68  56 12/16 - 8:40 am - 149/67  57  12/17 - 8:23 am - 133/64  57 12/18 - 9:12 am - 133/44  59 12/19 - 8:43 am - 144/65  56 12/20 - 8:30 am - 143/67  59  12/21 - 11:15 am - 139/60  62 12/22 - 8:38 am - 140/54  52 12/23 - 9:24 am - 136/53  57  12/24 - 8:22 am - 161/60  58 12/25 - 9:48 am - 184/64  63  12/26 - 10:44 am - 156/73  68 12/27 - 9:13 am - 172/72  63

## 2018-01-17 NOTE — Progress Notes (Signed)
Patient notified and verbalized understanding. 

## 2018-01-27 DIAGNOSIS — E11319 Type 2 diabetes mellitus with unspecified diabetic retinopathy without macular edema: Secondary | ICD-10-CM | POA: Diagnosis not present

## 2018-02-09 DIAGNOSIS — Z299 Encounter for prophylactic measures, unspecified: Secondary | ICD-10-CM | POA: Diagnosis not present

## 2018-02-09 DIAGNOSIS — N183 Chronic kidney disease, stage 3 (moderate): Secondary | ICD-10-CM | POA: Diagnosis not present

## 2018-02-09 DIAGNOSIS — E1142 Type 2 diabetes mellitus with diabetic polyneuropathy: Secondary | ICD-10-CM | POA: Diagnosis not present

## 2018-02-09 DIAGNOSIS — I10 Essential (primary) hypertension: Secondary | ICD-10-CM | POA: Diagnosis not present

## 2018-02-09 DIAGNOSIS — Z87891 Personal history of nicotine dependence: Secondary | ICD-10-CM | POA: Diagnosis not present

## 2018-02-09 DIAGNOSIS — E1122 Type 2 diabetes mellitus with diabetic chronic kidney disease: Secondary | ICD-10-CM | POA: Diagnosis not present

## 2018-02-09 DIAGNOSIS — E1165 Type 2 diabetes mellitus with hyperglycemia: Secondary | ICD-10-CM | POA: Diagnosis not present

## 2018-02-09 DIAGNOSIS — Z6841 Body Mass Index (BMI) 40.0 and over, adult: Secondary | ICD-10-CM | POA: Diagnosis not present

## 2018-02-09 DIAGNOSIS — R5383 Other fatigue: Secondary | ICD-10-CM | POA: Diagnosis not present

## 2018-02-11 DIAGNOSIS — E119 Type 2 diabetes mellitus without complications: Secondary | ICD-10-CM | POA: Diagnosis not present

## 2018-02-11 DIAGNOSIS — I2510 Atherosclerotic heart disease of native coronary artery without angina pectoris: Secondary | ICD-10-CM | POA: Diagnosis not present

## 2018-02-11 DIAGNOSIS — I10 Essential (primary) hypertension: Secondary | ICD-10-CM | POA: Diagnosis not present

## 2018-02-11 DIAGNOSIS — E7800 Pure hypercholesterolemia, unspecified: Secondary | ICD-10-CM | POA: Diagnosis not present

## 2018-02-16 DIAGNOSIS — L110 Acquired keratosis follicularis: Secondary | ICD-10-CM | POA: Diagnosis not present

## 2018-02-16 DIAGNOSIS — L609 Nail disorder, unspecified: Secondary | ICD-10-CM | POA: Diagnosis not present

## 2018-02-16 DIAGNOSIS — E1140 Type 2 diabetes mellitus with diabetic neuropathy, unspecified: Secondary | ICD-10-CM | POA: Diagnosis not present

## 2018-02-23 DIAGNOSIS — N183 Chronic kidney disease, stage 3 (moderate): Secondary | ICD-10-CM | POA: Diagnosis not present

## 2018-02-23 DIAGNOSIS — Z299 Encounter for prophylactic measures, unspecified: Secondary | ICD-10-CM | POA: Diagnosis not present

## 2018-02-23 DIAGNOSIS — F319 Bipolar disorder, unspecified: Secondary | ICD-10-CM | POA: Diagnosis not present

## 2018-02-23 DIAGNOSIS — Z6841 Body Mass Index (BMI) 40.0 and over, adult: Secondary | ICD-10-CM | POA: Diagnosis not present

## 2018-02-23 DIAGNOSIS — I10 Essential (primary) hypertension: Secondary | ICD-10-CM | POA: Diagnosis not present

## 2018-02-28 DIAGNOSIS — N183 Chronic kidney disease, stage 3 (moderate): Secondary | ICD-10-CM | POA: Diagnosis not present

## 2018-03-11 DIAGNOSIS — E7800 Pure hypercholesterolemia, unspecified: Secondary | ICD-10-CM | POA: Diagnosis not present

## 2018-03-11 DIAGNOSIS — I2510 Atherosclerotic heart disease of native coronary artery without angina pectoris: Secondary | ICD-10-CM | POA: Diagnosis not present

## 2018-03-11 DIAGNOSIS — E119 Type 2 diabetes mellitus without complications: Secondary | ICD-10-CM | POA: Diagnosis not present

## 2018-03-11 DIAGNOSIS — I10 Essential (primary) hypertension: Secondary | ICD-10-CM | POA: Diagnosis not present

## 2018-03-16 DIAGNOSIS — R809 Proteinuria, unspecified: Secondary | ICD-10-CM | POA: Diagnosis not present

## 2018-03-16 DIAGNOSIS — Z79899 Other long term (current) drug therapy: Secondary | ICD-10-CM | POA: Diagnosis not present

## 2018-03-16 DIAGNOSIS — D509 Iron deficiency anemia, unspecified: Secondary | ICD-10-CM | POA: Diagnosis not present

## 2018-03-16 DIAGNOSIS — I510 Cardiac septal defect, acquired: Secondary | ICD-10-CM | POA: Diagnosis not present

## 2018-03-16 DIAGNOSIS — E559 Vitamin D deficiency, unspecified: Secondary | ICD-10-CM | POA: Diagnosis not present

## 2018-03-16 DIAGNOSIS — N183 Chronic kidney disease, stage 3 (moderate): Secondary | ICD-10-CM | POA: Diagnosis not present

## 2018-03-22 DIAGNOSIS — I10 Essential (primary) hypertension: Secondary | ICD-10-CM | POA: Diagnosis not present

## 2018-03-22 DIAGNOSIS — E1121 Type 2 diabetes mellitus with diabetic nephropathy: Secondary | ICD-10-CM | POA: Diagnosis not present

## 2018-03-22 DIAGNOSIS — D638 Anemia in other chronic diseases classified elsewhere: Secondary | ICD-10-CM | POA: Diagnosis not present

## 2018-03-22 DIAGNOSIS — R809 Proteinuria, unspecified: Secondary | ICD-10-CM | POA: Diagnosis not present

## 2018-03-22 DIAGNOSIS — N183 Chronic kidney disease, stage 3 (moderate): Secondary | ICD-10-CM | POA: Diagnosis not present

## 2018-03-22 DIAGNOSIS — E875 Hyperkalemia: Secondary | ICD-10-CM | POA: Diagnosis not present

## 2018-03-23 DIAGNOSIS — R928 Other abnormal and inconclusive findings on diagnostic imaging of breast: Secondary | ICD-10-CM | POA: Diagnosis not present

## 2018-03-28 ENCOUNTER — Encounter (INDEPENDENT_AMBULATORY_CARE_PROVIDER_SITE_OTHER): Payer: Medicare Other | Admitting: Ophthalmology

## 2018-03-28 DIAGNOSIS — E113391 Type 2 diabetes mellitus with moderate nonproliferative diabetic retinopathy without macular edema, right eye: Secondary | ICD-10-CM | POA: Diagnosis not present

## 2018-03-28 DIAGNOSIS — E11311 Type 2 diabetes mellitus with unspecified diabetic retinopathy with macular edema: Secondary | ICD-10-CM

## 2018-03-28 DIAGNOSIS — H35033 Hypertensive retinopathy, bilateral: Secondary | ICD-10-CM

## 2018-03-28 DIAGNOSIS — H43813 Vitreous degeneration, bilateral: Secondary | ICD-10-CM | POA: Diagnosis not present

## 2018-03-28 DIAGNOSIS — I10 Essential (primary) hypertension: Secondary | ICD-10-CM | POA: Diagnosis not present

## 2018-03-28 DIAGNOSIS — E113312 Type 2 diabetes mellitus with moderate nonproliferative diabetic retinopathy with macular edema, left eye: Secondary | ICD-10-CM | POA: Diagnosis not present

## 2018-03-31 DIAGNOSIS — Z0000 Encounter for general adult medical examination without abnormal findings: Secondary | ICD-10-CM | POA: Diagnosis not present

## 2018-03-31 DIAGNOSIS — M25561 Pain in right knee: Secondary | ICD-10-CM | POA: Diagnosis not present

## 2018-03-31 DIAGNOSIS — R2689 Other abnormalities of gait and mobility: Secondary | ICD-10-CM | POA: Diagnosis not present

## 2018-03-31 DIAGNOSIS — Z6841 Body Mass Index (BMI) 40.0 and over, adult: Secondary | ICD-10-CM | POA: Diagnosis not present

## 2018-03-31 DIAGNOSIS — E1142 Type 2 diabetes mellitus with diabetic polyneuropathy: Secondary | ICD-10-CM | POA: Diagnosis not present

## 2018-03-31 DIAGNOSIS — Z1331 Encounter for screening for depression: Secondary | ICD-10-CM | POA: Diagnosis not present

## 2018-03-31 DIAGNOSIS — Z1339 Encounter for screening examination for other mental health and behavioral disorders: Secondary | ICD-10-CM | POA: Diagnosis not present

## 2018-03-31 DIAGNOSIS — Z1211 Encounter for screening for malignant neoplasm of colon: Secondary | ICD-10-CM | POA: Diagnosis not present

## 2018-03-31 DIAGNOSIS — M1711 Unilateral primary osteoarthritis, right knee: Secondary | ICD-10-CM | POA: Diagnosis not present

## 2018-03-31 DIAGNOSIS — Z299 Encounter for prophylactic measures, unspecified: Secondary | ICD-10-CM | POA: Diagnosis not present

## 2018-03-31 DIAGNOSIS — E1165 Type 2 diabetes mellitus with hyperglycemia: Secondary | ICD-10-CM | POA: Diagnosis not present

## 2018-03-31 DIAGNOSIS — Z7189 Other specified counseling: Secondary | ICD-10-CM | POA: Diagnosis not present

## 2018-04-01 ENCOUNTER — Encounter: Payer: Self-pay | Admitting: Cardiology

## 2018-04-01 ENCOUNTER — Ambulatory Visit (INDEPENDENT_AMBULATORY_CARE_PROVIDER_SITE_OTHER): Payer: Medicare Other | Admitting: Cardiology

## 2018-04-01 ENCOUNTER — Other Ambulatory Visit: Payer: Self-pay

## 2018-04-01 VITALS — BP 128/72 | HR 58 | Ht 63.0 in | Wt 234.4 lb

## 2018-04-01 DIAGNOSIS — I2510 Atherosclerotic heart disease of native coronary artery without angina pectoris: Secondary | ICD-10-CM | POA: Diagnosis not present

## 2018-04-01 DIAGNOSIS — I5032 Chronic diastolic (congestive) heart failure: Secondary | ICD-10-CM | POA: Diagnosis not present

## 2018-04-01 DIAGNOSIS — I10 Essential (primary) hypertension: Secondary | ICD-10-CM | POA: Diagnosis not present

## 2018-04-01 NOTE — Patient Instructions (Addendum)

## 2018-04-01 NOTE — Progress Notes (Signed)
Cardiology Office Note  Date: 04/01/2018   ID: MAKYNLIE ROSSINI, DOB 10/16/1950, MRN [id number]  PCP: Monico Blitz, MD  Primary Cardiologist: Rozann Lesches, MD   Chief Complaint  Patient presents with  . Cardiac follow-up    History of Present Illness: Christy [name] is a 68 y.o. female last seen in December 2019.  She is here for a routine visit.  She does not report any progressive shortness of breath over baseline, no exertional chest pain.  At the last visit she was taken off Cardizem CD with concurrent increase in labetalol and initiation of hydralazine.  Her blood pressure is better today.  She has not been checking it consistently, but it sounds like the trend is also better at home.  She continues to follow with Dr. Manuella Ghazi.  Past Medical History:  Diagnosis Date  . Bipolar disorder ([place])   . Coronary atherosclerosis of native coronary artery    BMS circ 2006, 70% PDA 2009  . Essential hypertension   . Mixed hyperlipidemia   . OSA on CPAP   . Renal tubular acidosis, type IV   . SVT (supraventricular tachycardia) ([place])   . Type 2 diabetes mellitus ([place])     Past Surgical History:  Procedure Laterality Date  . Acromioclavicular arthritis    . CARDIAC CATHETERIZATION N/A 03/29/2015   Procedure: Left Heart Cath and Coronary Angiography;  Surgeon: Troy Sine, MD;  Location: [facility];  Service: Cardiovascular;  Laterality: N/A;  . RIght shoulder adhesive capsulitis    . Rotator cuff impingement syndrome    . Rotator cuff tear      Current Outpatient Medications  Medication Sig Dispense Refill  . allopurinol (ZYLOPRIM) 300 MG tablet Take 150 mg by mouth daily.    Marland Kitchen amLODipine (NORVASC) 10 MG tablet Take 10 mg by mouth daily.    Marland Kitchen aspirin 81 MG EC tablet Take 81 mg by mouth daily.      Marland Kitchen BESIVANCE 0.6 % SUSP INSTILL ONE DROP IN THE LEFT EYE FOUR TIMES DAILY FOR 2 DAYS AFTER EACH MONTHLY EYE INJECTION  12  . buPROPion (WELLBUTRIN XL) 300 MG 24 hr tablet  Take 300 mg by mouth daily.    . calcium carbonate (OS-CAL) 600 MG TABS Take 600 mg by mouth daily.      . Cholecalciferol (VITAMIN D3) 25 MCG (1000 UT) CAPS Take 1 capsule by mouth daily.    . divalproex (DEPAKOTE) 250 MG EC tablet Take 250 mg by mouth daily.      . hydrALAZINE (APRESOLINE) 25 MG tablet Take 1 tablet (25 mg total) by mouth 2 (two) times daily. 180 tablet 3  . insulin aspart (NOVOLOG) 100 UNIT/ML injection Inject into the skin. 10 units before bedtime meal    . isosorbide mononitrate (IMDUR) 60 MG 24 hr tablet Take 1.5 tablets (90 mg total) by mouth daily. 45 tablet 6  . labetalol (NORMODYNE) 100 MG tablet Take 1 tablet (100 mg total) by mouth 2 (two) times daily. 180 tablet 3  . labetalol (NORMODYNE) 300 MG tablet Take 1 tablet by mouth 2 (two) times daily.    . Liraglutide (VICTOZA) 18 MG/3ML SOLN Inject 1.2 mg into the skin daily.     . Multiple Vitamin (MULTIVITAMIN) tablet Take 1 tablet by mouth daily.      . nitroGLYCERIN (NITROLINGUAL) 0.4 MG/SPRAY spray Place 1 spray under the tongue every 5 (five) minutes x 3 doses as needed. 12 g 2  .  rosuvastatin (CRESTOR) 10 MG tablet Take 10 mg by mouth daily.    . sertraline (ZOLOFT) 100 MG tablet Take 100 mg by mouth daily.      Marland Kitchen torsemide (DEMADEX) 20 MG tablet Take 3 tablets (60 mg total) by mouth every morning. 90 tablet 6  . traZODone (DESYREL) 100 MG tablet TAKE ONE TAB BY MOUTH AT BEDTIME  3  . TRESIBA FLEXTOUCH 200 UNIT/ML SOPN Inject 62 Units into the skin every morning.   2   No current facility-administered medications for this visit.    Allergies:  Ace inhibitors; Lisinopril; Penicillins; Tetracycline; and Lamictal [lamotrigine]   Social History: The patient  reports that she quit smoking about 4 years ago. Her smoking use included cigarettes. She started smoking about 33 years ago. She has a 20.00 pack-year smoking history. She has never used smokeless tobacco. She reports that she does not drink alcohol or use  drugs.   ROS:  Please see the history of present illness. Otherwise, complete review of systems is positive for recent fall, lost her balance..  All other systems are reviewed and negative.   Physical Exam: VS:  BP 128/72   Pulse (!) 58   Ht 5\' 3"  (1.6 m)   Wt 234 lb 6.4 oz (106.3 kg)   SpO2 96%   BMI 41.52 kg/m , BMI Body mass index is 41.52 kg/m.  Wt Readings from Last 3 Encounters:  04/01/18 234 lb 6.4 oz (106.3 kg)  12/31/17 227 lb 9.6 oz (103.2 kg)  12/01/17 232 lb (105.2 kg)    General: Patient appears comfortable at rest. HEENT: Conjunctiva and lids normal, oropharynx clear. Neck: Supple, no elevated JVP or carotid bruits, no thyromegaly. Lungs: Clear to auscultation, nonlabored breathing at rest. Cardiac: Regular rate and rhythm, no S3, 2/6 systolic murmur. Abdomen: Soft, nontender, bowel sounds present. Extremities: No pitting edema, distal pulses 2+. Skin: Warm and dry.  Ecchymoses and excoriation right upper leg and knee, right olecranon area. Musculoskeletal: No kyphosis. Neuropsychiatric: Alert and oriented x3, affect grossly appropriate.  ECG: I personally reviewed the tracing from 09/07/2017 which showed sinus rhythm with poor R wave progression.  Recent Labwork: 11/24/2017: ALT 18; AST 19; BUN 58; Creatinine, Ser 1.76; Hemoglobin 10.1; Platelets 140; Potassium 5.1; Sodium 139  December 2019: BUN 43, creatinine 1.3, potassium 4.9  Other Studies Reviewed Today:  Cardiac catheterization 03/29/2015: No significant coronary obstructive disease with evidence for widely patent stent in the proximal left circumflex artery, a normal LAD and a normal very large dominant RCA.  Systemic hypertension with elevated left ventricular end-diastolic pressure at 37 mm.  Echocardiogram 03/13/2015: Study Conclusions  - Left ventricle: The cavity size was normal. Wall thickness was increased in a pattern of mild LVH. Systolic function was normal. The estimated ejection  fraction was in the range of 60% to 65%. Wall motion was normal; there were no regional wall motion abnormalities. Doppler parameters are consistent with restrictive physiology, indicative of decreased left ventricular diastolic compliance and/or increased left atrial pressure. - Aortic valve: There was mild regurgitation. Valve area (VTI): 2.84 cm^2. Valve area (Vmax): 2.77 cm^2. Valve area (Vmean): 2.57 cm^2. - Mitral valve: Mildly calcified annulus. Mildly thickened leaflets . There was mild regurgitation. - Left atrium: The atrium was severely dilated. - Right atrium: The atrium was mildly dilated. - Pulmonary arteries: Systolic pressure was mildly increased. PA peak pressure: 37 mm Hg (S). - Technically difficult study.  Assessment and Plan:  1.  Essential hypertension.  Plan is  to continue current regimen including Norvasc, hydralazine, Imdur, and labetalol.  2.  Chronic diastolic heart failure.  Continue Demadex.  Her weight has been up recently, we discussed salt and fluid restriction.  3.  CAD status post BMS to the circumflex in 2006.  Cardiac catheterization in 2016 revealed patent stent site.  She remains on aspirin and statin therapy.  Current medicines were reviewed with the patient today.  Disposition: Follow-up in 6 months.  Signed, Satira Sark, MD, [facility] 04/01/2018 3:43 PM    [facility] [place], [place], [place] [address] Phone: [phone]; Fax: [phone]

## 2018-04-04 DIAGNOSIS — E559 Vitamin D deficiency, unspecified: Secondary | ICD-10-CM | POA: Diagnosis not present

## 2018-04-04 DIAGNOSIS — E7800 Pure hypercholesterolemia, unspecified: Secondary | ICD-10-CM | POA: Diagnosis not present

## 2018-04-04 DIAGNOSIS — R5383 Other fatigue: Secondary | ICD-10-CM | POA: Diagnosis not present

## 2018-04-04 DIAGNOSIS — Z79899 Other long term (current) drug therapy: Secondary | ICD-10-CM | POA: Diagnosis not present

## 2018-04-06 DIAGNOSIS — F3112 Bipolar disorder, current episode manic without psychotic features, moderate: Secondary | ICD-10-CM | POA: Diagnosis not present

## 2018-04-06 DIAGNOSIS — F411 Generalized anxiety disorder: Secondary | ICD-10-CM | POA: Diagnosis not present

## 2018-04-06 DIAGNOSIS — F4310 Post-traumatic stress disorder, unspecified: Secondary | ICD-10-CM | POA: Diagnosis not present

## 2018-04-11 ENCOUNTER — Other Ambulatory Visit: Payer: Self-pay | Admitting: Cardiology

## 2018-04-20 DIAGNOSIS — F319 Bipolar disorder, unspecified: Secondary | ICD-10-CM | POA: Diagnosis not present

## 2018-04-20 DIAGNOSIS — E1122 Type 2 diabetes mellitus with diabetic chronic kidney disease: Secondary | ICD-10-CM | POA: Diagnosis not present

## 2018-04-20 DIAGNOSIS — I10 Essential (primary) hypertension: Secondary | ICD-10-CM | POA: Diagnosis not present

## 2018-04-20 DIAGNOSIS — N183 Chronic kidney disease, stage 3 (moderate): Secondary | ICD-10-CM | POA: Diagnosis not present

## 2018-04-20 DIAGNOSIS — E1165 Type 2 diabetes mellitus with hyperglycemia: Secondary | ICD-10-CM | POA: Diagnosis not present

## 2018-04-20 DIAGNOSIS — Z6841 Body Mass Index (BMI) 40.0 and over, adult: Secondary | ICD-10-CM | POA: Diagnosis not present

## 2018-04-20 DIAGNOSIS — Z299 Encounter for prophylactic measures, unspecified: Secondary | ICD-10-CM | POA: Diagnosis not present

## 2018-04-22 DIAGNOSIS — E7800 Pure hypercholesterolemia, unspecified: Secondary | ICD-10-CM | POA: Diagnosis not present

## 2018-04-22 DIAGNOSIS — I10 Essential (primary) hypertension: Secondary | ICD-10-CM | POA: Diagnosis not present

## 2018-04-22 DIAGNOSIS — E119 Type 2 diabetes mellitus without complications: Secondary | ICD-10-CM | POA: Diagnosis not present

## 2018-04-22 DIAGNOSIS — I2510 Atherosclerotic heart disease of native coronary artery without angina pectoris: Secondary | ICD-10-CM | POA: Diagnosis not present

## 2018-05-04 DIAGNOSIS — E1140 Type 2 diabetes mellitus with diabetic neuropathy, unspecified: Secondary | ICD-10-CM | POA: Diagnosis not present

## 2018-05-04 DIAGNOSIS — L609 Nail disorder, unspecified: Secondary | ICD-10-CM | POA: Diagnosis not present

## 2018-05-04 DIAGNOSIS — L110 Acquired keratosis follicularis: Secondary | ICD-10-CM | POA: Diagnosis not present

## 2018-06-01 ENCOUNTER — Inpatient Hospital Stay (HOSPITAL_COMMUNITY): Payer: Medicare Other | Attending: Hematology

## 2018-06-01 ENCOUNTER — Other Ambulatory Visit: Payer: Self-pay

## 2018-06-01 DIAGNOSIS — I2510 Atherosclerotic heart disease of native coronary artery without angina pectoris: Secondary | ICD-10-CM | POA: Diagnosis not present

## 2018-06-01 DIAGNOSIS — Z7982 Long term (current) use of aspirin: Secondary | ICD-10-CM | POA: Insufficient documentation

## 2018-06-01 DIAGNOSIS — D472 Monoclonal gammopathy: Secondary | ICD-10-CM | POA: Diagnosis not present

## 2018-06-01 DIAGNOSIS — D631 Anemia in chronic kidney disease: Secondary | ICD-10-CM | POA: Diagnosis not present

## 2018-06-01 DIAGNOSIS — I129 Hypertensive chronic kidney disease with stage 1 through stage 4 chronic kidney disease, or unspecified chronic kidney disease: Secondary | ICD-10-CM | POA: Insufficient documentation

## 2018-06-01 DIAGNOSIS — E1122 Type 2 diabetes mellitus with diabetic chronic kidney disease: Secondary | ICD-10-CM | POA: Insufficient documentation

## 2018-06-01 DIAGNOSIS — Z87891 Personal history of nicotine dependence: Secondary | ICD-10-CM | POA: Diagnosis not present

## 2018-06-01 DIAGNOSIS — N184 Chronic kidney disease, stage 4 (severe): Secondary | ICD-10-CM | POA: Diagnosis not present

## 2018-06-01 DIAGNOSIS — E782 Mixed hyperlipidemia: Secondary | ICD-10-CM | POA: Insufficient documentation

## 2018-06-01 DIAGNOSIS — D649 Anemia, unspecified: Secondary | ICD-10-CM

## 2018-06-01 DIAGNOSIS — Z79899 Other long term (current) drug therapy: Secondary | ICD-10-CM | POA: Diagnosis not present

## 2018-06-01 DIAGNOSIS — G4733 Obstructive sleep apnea (adult) (pediatric): Secondary | ICD-10-CM | POA: Insufficient documentation

## 2018-06-01 DIAGNOSIS — F319 Bipolar disorder, unspecified: Secondary | ICD-10-CM | POA: Diagnosis not present

## 2018-06-01 DIAGNOSIS — Z794 Long term (current) use of insulin: Secondary | ICD-10-CM | POA: Diagnosis not present

## 2018-06-01 LAB — CBC WITH DIFFERENTIAL/PLATELET
Abs Immature Granulocytes: 0.01 10*3/uL (ref 0.00–0.07)
Basophils Absolute: 0 10*3/uL (ref 0.0–0.1)
Basophils Relative: 1 %
Eosinophils Absolute: 0.6 10*3/uL — ABNORMAL HIGH (ref 0.0–0.5)
Eosinophils Relative: 7 %
HCT: 31.9 % — ABNORMAL LOW (ref 36.0–46.0)
Hemoglobin: 9.9 g/dL — ABNORMAL LOW (ref 12.0–15.0)
Immature Granulocytes: 0 %
Lymphocytes Relative: 23 %
Lymphs Abs: 1.8 10*3/uL (ref 0.7–4.0)
MCH: 30.8 pg (ref 26.0–34.0)
MCHC: 31 g/dL (ref 30.0–36.0)
MCV: 99.4 fL (ref 80.0–100.0)
Monocytes Absolute: 0.5 10*3/uL (ref 0.1–1.0)
Monocytes Relative: 6 %
Neutro Abs: 4.9 10*3/uL (ref 1.7–7.7)
Neutrophils Relative %: 63 %
Platelets: 156 10*3/uL (ref 150–400)
RBC: 3.21 MIL/uL — ABNORMAL LOW (ref 3.87–5.11)
RDW: 13.5 % (ref 11.5–15.5)
WBC: 7.8 10*3/uL (ref 4.0–10.5)
nRBC: 0 % (ref 0.0–0.2)

## 2018-06-01 LAB — COMPREHENSIVE METABOLIC PANEL
ALT: 20 U/L (ref 0–44)
AST: 20 U/L (ref 15–41)
Albumin: 3.6 g/dL (ref 3.5–5.0)
Alkaline Phosphatase: 60 U/L (ref 38–126)
Anion gap: 11 (ref 5–15)
BUN: 34 mg/dL — ABNORMAL HIGH (ref 8–23)
CO2: 25 mmol/L (ref 22–32)
Calcium: 8.6 mg/dL — ABNORMAL LOW (ref 8.9–10.3)
Chloride: 104 mmol/L (ref 98–111)
Creatinine, Ser: 1.67 mg/dL — ABNORMAL HIGH (ref 0.44–1.00)
GFR calc Af Amer: 36 mL/min — ABNORMAL LOW (ref >60)
GFR calc non Af Amer: 31 mL/min — ABNORMAL LOW (ref >60)
Glucose, Bld: 149 mg/dL — ABNORMAL HIGH (ref 70–99)
Potassium: 5.1 mmol/L (ref 3.5–5.1)
Sodium: 140 mmol/L (ref 135–145)
Total Bilirubin: 0.3 mg/dL (ref 0.3–1.2)
Total Protein: 6.6 g/dL (ref 6.5–8.1)

## 2018-06-01 LAB — FERRITIN
Ferritin: 28 ng/mL (ref 11–307)
Ferritin: 32 ng/mL (ref 11–307)

## 2018-06-01 LAB — LACTATE DEHYDROGENASE: LDH: 213 U/L — ABNORMAL HIGH (ref 98–192)

## 2018-06-02 LAB — PROTEIN ELECTROPHORESIS, SERUM
A/G Ratio: 1.2 (ref 0.7–1.7)
Albumin ELP: 3.4 g/dL (ref 2.9–4.4)
Alpha-1-Globulin: 0.2 g/dL (ref 0.0–0.4)
Alpha-2-Globulin: 0.5 g/dL (ref 0.4–1.0)
Beta Globulin: 0.9 g/dL (ref 0.7–1.3)
Gamma Globulin: 1.1 g/dL (ref 0.4–1.8)
Globulin, Total: 2.8 g/dL (ref 2.2–3.9)
M-Spike, %: 0.6 g/dL — ABNORMAL HIGH
Total Protein ELP: 6.2 g/dL (ref 6.0–8.5)

## 2018-06-02 LAB — KAPPA/LAMBDA LIGHT CHAINS
Kappa free light chain: 73.9 mg/L — ABNORMAL HIGH (ref 3.3–19.4)
Kappa, lambda light chain ratio: 1.82 — ABNORMAL HIGH (ref 0.26–1.65)
Lambda free light chains: 40.7 mg/L — ABNORMAL HIGH (ref 5.7–26.3)

## 2018-06-02 LAB — IGG, IGA, IGM
IgA: 127 mg/dL (ref 87–352)
IgG (Immunoglobin G), Serum: 1152 mg/dL (ref 586–1602)
IgM (Immunoglobulin M), Srm: 218 mg/dL — ABNORMAL HIGH (ref 26–217)

## 2018-06-03 DIAGNOSIS — I2510 Atherosclerotic heart disease of native coronary artery without angina pectoris: Secondary | ICD-10-CM | POA: Diagnosis not present

## 2018-06-03 DIAGNOSIS — E119 Type 2 diabetes mellitus without complications: Secondary | ICD-10-CM | POA: Diagnosis not present

## 2018-06-03 DIAGNOSIS — I10 Essential (primary) hypertension: Secondary | ICD-10-CM | POA: Diagnosis not present

## 2018-06-03 DIAGNOSIS — E7800 Pure hypercholesterolemia, unspecified: Secondary | ICD-10-CM | POA: Diagnosis not present

## 2018-06-08 ENCOUNTER — Other Ambulatory Visit: Payer: Self-pay

## 2018-06-08 ENCOUNTER — Inpatient Hospital Stay (HOSPITAL_BASED_OUTPATIENT_CLINIC_OR_DEPARTMENT_OTHER): Payer: Medicare Other | Admitting: Nurse Practitioner

## 2018-06-08 DIAGNOSIS — D472 Monoclonal gammopathy: Secondary | ICD-10-CM | POA: Diagnosis not present

## 2018-06-08 DIAGNOSIS — N184 Chronic kidney disease, stage 4 (severe): Secondary | ICD-10-CM | POA: Diagnosis not present

## 2018-06-08 DIAGNOSIS — E1122 Type 2 diabetes mellitus with diabetic chronic kidney disease: Secondary | ICD-10-CM | POA: Diagnosis not present

## 2018-06-08 DIAGNOSIS — I129 Hypertensive chronic kidney disease with stage 1 through stage 4 chronic kidney disease, or unspecified chronic kidney disease: Secondary | ICD-10-CM

## 2018-06-08 DIAGNOSIS — D631 Anemia in chronic kidney disease: Secondary | ICD-10-CM

## 2018-06-08 DIAGNOSIS — E782 Mixed hyperlipidemia: Secondary | ICD-10-CM | POA: Diagnosis not present

## 2018-06-08 DIAGNOSIS — Z79899 Other long term (current) drug therapy: Secondary | ICD-10-CM | POA: Diagnosis not present

## 2018-06-08 NOTE — Assessment & Plan Note (Addendum)
1.  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  She was noted to have an elevated creatinine and was seen by nephrologist in December 2014.  She had an SPEP done as part of her work-up for kidney disease and was noted to have a M spike of 0.6 with normal lambda light chain ratio.  Immunofixation showed IgG kappa monoclonal protein.  Patient had hep C antibody which was negative, ANA negative and C ANCA negative, P-ANCA negative, anti-double-stranded DNA antibody negative and complements were within normal limits.  Her creatinine at that time was 1.32 and hemoglobin was 11.8.  Also she had no significant protein in her urine. -She also had a bone scan which was noted to be negative.  She never had a bone marrow biopsy done at this time. - Patient was being followed by Dr. Tressie Stalker at our clinic since early 2017. - Labs on 06/01/2018 shows potassium 5.1, creatinine 1.67, calcium 8.6, LFTs were normal, LDH 213, SPEP stable with M spike 0.6 g/dL.  WBC 7.8, hemoglobin 9.9, platelets 156.  Light chain ratio 1.82. - She reports no B symptoms including fevers, chills, night sweats, or unexplained weight loss. - We will order a skeletal survey prior to her next visit. -She will follow-up in 3 to 4 weeks after her skeletal survey and labs.  2.  Anemia: - Likely due from CKD. -Labs on 06/01/2018 showed her hemoglobin at 9.9, ferritin 28, and creatinine 1.67. -I will order labs including iron panel, ferritin, and B12, and vit D.   3.  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 -Labs on 06/01/2018 showed her creatinine 1.67. - She follows up with her nephrologist regularly.

## 2018-06-08 NOTE — Patient Instructions (Signed)
[place] at [facility] Discharge Instructions  Follow up in a few weeks after your scan and labs    Thank you for choosing [place] at [facility] to provide your oncology and hematology care.  To afford each patient quality time with our provider, please arrive at least 15 minutes before your scheduled appointment time.   If you have a lab appointment with the [facility] please come in thru the  Main Entrance and check in at the main information desk  You need to re-schedule your appointment should you arrive 10 or more minutes late.  We strive to give you quality time with our providers, and arriving late affects you and other patients whose appointments are after yours.  Also, if you no show three or more times for appointments you may be dismissed from the clinic at the providers discretion.     Again, thank you for choosing [facility].  Our hope is that these requests will decrease the amount of time that you wait before being seen by our physicians.       _____________________________________________________________  Should you have questions after your visit to [facility], please contact our office at [phone] between the hours of 8:00 a.m. and 4:30 p.m.  Voicemails left after 4:00 p.m. will not be returned until the following business day.  For prescription refill requests, have your pharmacy contact our office and allow 72 hours.    Cancer Center Support Programs:   > Cancer Support Group  2nd Tuesday of the month 1pm-2pm, Journey Room

## 2018-06-08 NOTE — Progress Notes (Signed)
Christy [name], [place] [address]    CLINIC:  Medical Oncology/Hematology  PCP:  Monico Blitz, [place] [address] [phone]   REASON FOR VISIT: Follow-up for MGUS  CURRENT THERAPY: Observation   INTERVAL HISTORY:  Christy [name] 68 y.o. female returns for routine follow-up for MGUS.  She has a history of hypertension, dyslipidemia, coronary artery disease status post PCI, obstructive sleep apnea on CPAP, diabetes type 2, CKD stage IV, RTA type IV and bipolar disorder who was noted to have elevation in her creatinine and was seen by nephrologist in December 2014.  She had SPEP done as part of her work-up for her kidney disease and was noted to have M spike of 0.6 with normal lambda light chain ratio.  She was then referred to our clinic and was seen by Dr. Tressie Stalker in 2017.  She denies any B symptoms at this time including fevers, chills, night sweats, or unexplained weight loss. Denies any nausea, vomiting, or diarrhea. Denies any new pains. Had not noticed any recent bleeding such as epistaxis, hematuria or hematochezia. Denies recent chest pain on exertion, shortness of breath on minimal exertion, pre-syncopal episodes, or palpitations. Denies any numbness or tingling in hands or feet. Denies any recent fevers, infections, or recent hospitalizations. Patient reports appetite at 100% and energy level at 25%.  She is eating well and maintaining her weight at this time.    REVIEW OF SYSTEMS:  Review of Systems  Respiratory: Positive for shortness of breath.   All other systems reviewed and are negative.    PAST MEDICAL/SURGICAL HISTORY:  Past Medical History:  Diagnosis Date  . Bipolar disorder ([place])   . Coronary atherosclerosis of native coronary artery    BMS circ 2006, 70% PDA 2009  . Essential hypertension   . Mixed hyperlipidemia   . OSA on CPAP   . Renal tubular acidosis, type IV   . SVT (supraventricular tachycardia) ([place])    . Type 2 diabetes mellitus ([place])    Past Surgical History:  Procedure Laterality Date  . Acromioclavicular arthritis    . CARDIAC CATHETERIZATION N/A 03/29/2015   Procedure: Left Heart Cath and Coronary Angiography;  Surgeon: Troy Sine, MD;  Location: [facility];  Service: Cardiovascular;  Laterality: N/A;  . RIght shoulder adhesive capsulitis    . Rotator cuff impingement syndrome    . Rotator cuff tear       SOCIAL HISTORY:  Social History   Socioeconomic History  . Marital status: Divorced    Spouse name: Not on file  . Number of children: Not on file  . Years of education: Not on file  . Highest education level: Not on file  Occupational History  . Occupation: PLANNING AND INSPECTOR    Employer: [employer]  Social Needs  . Financial resource strain: Not on file  . Food insecurity:    Worry: Not on file    Inability: Not on file  . Transportation needs:    Medical: Not on file    Non-medical: Not on file  Tobacco Use  . Smoking status: Former Smoker    Packs/day: 1.00    Years: 20.00    Pack years: 20.00    Types: Cigarettes    Start date: 10/30/1984    Last attempt to quit: 07/08/2013    Years since quitting: 4.9  . Smokeless tobacco: Never Used  Substance and Sexual Activity  . Alcohol use:  No    Alcohol/week: 0.0 standard drinks  . Drug use: No  . Sexual activity: Not on file  Lifestyle  . Physical activity:    Days per week: Not on file    Minutes per session: Not on file  . Stress: Not on file  Relationships  . Social connections:    Talks on phone: Not on file    Gets together: Not on file    Attends religious service: Not on file    Active member of club or organization: Not on file    Attends meetings of clubs or organizations: Not on file    Relationship status: Not on file  . Intimate partner violence:    Fear of current or ex partner: Not on file    Emotionally abused: Not on file    Physically abused: Not on file    Forced  sexual activity: Not on file  Other Topics Concern  . Not on file  Social History Narrative   She has lived with some of her daughters. Works part-time for the city.     FAMILY HISTORY:  Family History  Problem Relation Age of Onset  . Bipolar disorder Brother   . Diabetes Father     CURRENT MEDICATIONS:  Outpatient Encounter Medications as of 06/08/2018  Medication Sig  . allopurinol (ZYLOPRIM) 300 MG tablet Take 150 mg by mouth daily.  Marland Kitchen amLODipine (NORVASC) 10 MG tablet Take 10 mg by mouth daily.  Marland Kitchen aspirin 81 MG EC tablet Take 81 mg by mouth daily.    Marland Kitchen BESIVANCE 0.6 % SUSP INSTILL ONE DROP IN THE LEFT EYE FOUR TIMES DAILY FOR 2 DAYS AFTER EACH MONTHLY EYE INJECTION  . buPROPion (WELLBUTRIN XL) 300 MG 24 hr tablet Take 300 mg by mouth daily.  . calcium carbonate (OS-CAL) 600 MG TABS Take 600 mg by mouth daily.    . Cholecalciferol (VITAMIN D3) 25 MCG (1000 UT) CAPS Take 1 capsule by mouth daily.  . divalproex (DEPAKOTE) 250 MG EC tablet Take 250 mg by mouth daily.    . hydrALAZINE (APRESOLINE) 25 MG tablet Take 1 tablet (25 mg total) by mouth 2 (two) times daily.  . insulin aspart (NOVOLOG) 100 UNIT/ML injection Inject into the skin. 10 units before bedtime meal  . insulin degludec (TRESIBA) 100 UNIT/ML SOPN FlexTouch Pen Inject into the skin.  Marland Kitchen isosorbide mononitrate (IMDUR) 60 MG 24 hr tablet Take 1.5 tablets (90 mg total) by mouth daily.  Marland Kitchen labetalol (NORMODYNE) 300 MG tablet TAKE 1 TABLET BY MOUTH TWICE DAILY  . Liraglutide (VICTOZA) 18 MG/3ML SOLN Inject 1.2 mg into the skin daily.   . Multiple Vitamin (MULTIVITAMIN) tablet Take 1 tablet by mouth daily.    . nitroGLYCERIN (NITROLINGUAL) 0.4 MG/SPRAY spray Place 1 spray under the tongue every 5 (five) minutes x 3 doses as needed.  . rosuvastatin (CRESTOR) 10 MG tablet Take 10 mg by mouth daily.  . sertraline (ZOLOFT) 100 MG tablet Take 100 mg by mouth daily.    Marland Kitchen torsemide (DEMADEX) 20 MG tablet Take 3 tablets (60 mg total)  by mouth every morning.  . traZODone (DESYREL) 100 MG tablet TAKE ONE TAB BY MOUTH AT BEDTIME  . TRESIBA FLEXTOUCH 200 UNIT/ML SOPN Inject 62 Units into the skin every morning.   . [DISCONTINUED] Calcium Carb-Cholecalciferol 600-800 MG-UNIT TABS Take by mouth.  . [DISCONTINUED] labetalol (NORMODYNE) 100 MG tablet Take 1 tablet (100 mg total) by mouth 2 (two) times daily.   No facility-administered  encounter medications on file as of 06/08/2018.     ALLERGIES:  Allergies  Allergen Reactions  . Ace Inhibitors   . Lisinopril     Severe cough  . Penicillins   . Tetracycline   . Lamictal [Lamotrigine] Rash    Rash start inside to outter body.     PHYSICAL EXAM:  ECOG Performance status: 1  Vitals:   06/08/18 1425  BP: (!) 159/93  Pulse: 63  Resp: 18  Temp: 98.3 F (36.8 C)  SpO2: 98%   Filed Weights   06/08/18 1425  Weight: 237 lb 8 oz (107.7 kg)    Physical Exam Constitutional:      Appearance: Normal appearance. She is normal weight.  Cardiovascular:     Rate and Rhythm: Normal rate and regular rhythm.     Heart sounds: Normal heart sounds.  Pulmonary:     Effort: Pulmonary effort is normal.     Breath sounds: Normal breath sounds.  Abdominal:     General: Bowel sounds are normal.     Palpations: Abdomen is soft.  Musculoskeletal: Normal range of motion.  Skin:    General: Skin is warm and dry.  Neurological:     Mental Status: She is alert and oriented to person, place, and time. Mental status is at baseline.  Psychiatric:        Mood and Affect: Mood normal.        Behavior: Behavior normal.        Thought Content: Thought content normal.        Judgment: Judgment normal.      LABORATORY DATA:  I have reviewed the labs as listed.  CBC    Component Value Date/Time   WBC 7.8 06/01/2018 1522   RBC 3.21 (L) 06/01/2018 1522   HGB 9.9 (L) 06/01/2018 1522   HCT 31.9 (L) 06/01/2018 1522   PLT 156 06/01/2018 1522   MCV 99.4 06/01/2018 1522   MCH 30.8  06/01/2018 1522   MCHC 31.0 06/01/2018 1522   RDW 13.5 06/01/2018 1522   LYMPHSABS 1.8 06/01/2018 1522   MONOABS 0.5 06/01/2018 1522   EOSABS 0.6 (H) 06/01/2018 1522   BASOSABS 0.0 06/01/2018 1522   CMP Latest Ref Rng & Units 06/01/2018 11/24/2017 05/03/2017  Glucose 70 - 99 mg/dL 149(H) 136(H) 176(H)  BUN 8 - 23 mg/dL 34(H) 58(H) 45(H)  Creatinine 0.44 - 1.00 mg/dL 1.67(H) 1.76(H) 1.34(H)  Sodium 135 - 145 mmol/L 140 139 139  Potassium 3.5 - 5.1 mmol/L 5.1 5.1 5.2(H)  Chloride 98 - 111 mmol/L 104 105 106  CO2 22 - 32 mmol/L '25 26 25  '$ Calcium 8.9 - 10.3 mg/dL 8.6(L) 8.9 8.7(L)  Total Protein 6.5 - 8.1 g/dL 6.6 7.2 7.4  Total Bilirubin 0.3 - 1.2 mg/dL 0.3 0.7 0.5  Alkaline Phos 38 - 126 U/L 60 49 60  AST 15 - 41 U/L '20 19 19  '$ ALT 0 - 44 U/L '20 18 16     '$ I personally performed a face-to-face visit.  All questions were answered to patient's stated satisfaction. Encouraged patient to call with any new concerns or questions before his next visit to the cancer center and we can certain see him sooner, if needed.     ASSESSMENT & PLAN:   MGUS (monoclonal gammopathy of unknown significance) 1.  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  She was noted to have an elevated creatinine and was seen  by nephrologist in December 2014.  She had an SPEP done as part of her work-up for kidney disease and was noted to have a M spike of 0.6 with normal lambda light chain ratio.  Immunofixation showed IgG kappa monoclonal protein.  Patient had hep C antibody which was negative, ANA negative and C ANCA negative, P-ANCA negative, anti-double-stranded DNA antibody negative and complements were within normal limits.  Her creatinine at that time was 1.32 and hemoglobin was 11.8.  Also she had no significant protein in her urine. -She also had a bone scan which was noted to be negative.  She never had a bone marrow biopsy done at  this time. - Patient was being followed by Dr. Tressie Stalker at our clinic since early 2017. - Labs on 06/01/2018 shows potassium 5.1, creatinine 1.67, calcium 8.6, LFTs were normal, LDH 213, SPEP stable with M spike 0.6 g/dL.  WBC 7.8, hemoglobin 9.9, platelets 156.  Light chain ratio 1.82. - She reports no B symptoms including fevers, chills, night sweats, or unexplained weight loss. - We will order a skeletal survey prior to her next visit. -She will follow-up in 3 to 4 weeks after her skeletal survey and labs.  2.  Anemia: - Likely due from CKD. -Labs on 06/01/2018 showed her hemoglobin at 9.9, ferritin 28, and creatinine 1.67. -I will order labs including iron panel, ferritin, and B12, and vit D.   3.  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 -Labs on 06/01/2018 showed her creatinine 1.67. - She follows up with her nephrologist regularly.      Orders placed this encounter:  Orders Placed This Encounter  Procedures  . DG Bone Survey Met  . CBC with Differential/Platelet  . Comprehensive metabolic panel  . Ferritin  . Iron and TIBC  . Vitamin B12  . VITAMIN D 25 Hydroxy (Vit-D Deficiency, Fractures)  . Folate  . CBC with Differential/Platelet  . Comprehensive metabolic panel      Francene Finders, FNP-C [place] [phone]

## 2018-06-14 DIAGNOSIS — H81393 Other peripheral vertigo, bilateral: Secondary | ICD-10-CM | POA: Diagnosis not present

## 2018-06-14 DIAGNOSIS — E1159 Type 2 diabetes mellitus with other circulatory complications: Secondary | ICD-10-CM | POA: Diagnosis not present

## 2018-06-14 DIAGNOSIS — E1140 Type 2 diabetes mellitus with diabetic neuropathy, unspecified: Secondary | ICD-10-CM | POA: Diagnosis not present

## 2018-06-24 DIAGNOSIS — E2839 Other primary ovarian failure: Secondary | ICD-10-CM | POA: Diagnosis not present

## 2018-06-29 DIAGNOSIS — Z6841 Body Mass Index (BMI) 40.0 and over, adult: Secondary | ICD-10-CM | POA: Diagnosis not present

## 2018-06-29 DIAGNOSIS — E7800 Pure hypercholesterolemia, unspecified: Secondary | ICD-10-CM | POA: Diagnosis not present

## 2018-06-29 DIAGNOSIS — I10 Essential (primary) hypertension: Secondary | ICD-10-CM | POA: Diagnosis not present

## 2018-06-29 DIAGNOSIS — R2689 Other abnormalities of gait and mobility: Secondary | ICD-10-CM | POA: Diagnosis not present

## 2018-06-29 DIAGNOSIS — E1122 Type 2 diabetes mellitus with diabetic chronic kidney disease: Secondary | ICD-10-CM | POA: Diagnosis not present

## 2018-06-29 DIAGNOSIS — Z299 Encounter for prophylactic measures, unspecified: Secondary | ICD-10-CM | POA: Diagnosis not present

## 2018-06-29 DIAGNOSIS — I509 Heart failure, unspecified: Secondary | ICD-10-CM | POA: Diagnosis not present

## 2018-07-01 DIAGNOSIS — N183 Chronic kidney disease, stage 3 (moderate): Secondary | ICD-10-CM | POA: Diagnosis not present

## 2018-07-05 ENCOUNTER — Inpatient Hospital Stay (HOSPITAL_COMMUNITY): Payer: Medicare Other | Attending: Hematology

## 2018-07-05 ENCOUNTER — Ambulatory Visit (HOSPITAL_COMMUNITY)
Admission: RE | Admit: 2018-07-05 | Discharge: 2018-07-05 | Disposition: A | Discharge status: Home / Self Care | Payer: Medicare Other | Source: Ambulatory Visit | Attending: Nurse Practitioner | Admitting: Nurse Practitioner

## 2018-07-05 ENCOUNTER — Other Ambulatory Visit: Payer: Self-pay

## 2018-07-05 DIAGNOSIS — D472 Monoclonal gammopathy: Secondary | ICD-10-CM | POA: Insufficient documentation

## 2018-07-05 DIAGNOSIS — I129 Hypertensive chronic kidney disease with stage 1 through stage 4 chronic kidney disease, or unspecified chronic kidney disease: Secondary | ICD-10-CM | POA: Insufficient documentation

## 2018-07-05 DIAGNOSIS — Z7982 Long term (current) use of aspirin: Secondary | ICD-10-CM | POA: Insufficient documentation

## 2018-07-05 DIAGNOSIS — E1122 Type 2 diabetes mellitus with diabetic chronic kidney disease: Secondary | ICD-10-CM | POA: Insufficient documentation

## 2018-07-05 DIAGNOSIS — Z794 Long term (current) use of insulin: Secondary | ICD-10-CM | POA: Insufficient documentation

## 2018-07-05 DIAGNOSIS — Z87891 Personal history of nicotine dependence: Secondary | ICD-10-CM | POA: Insufficient documentation

## 2018-07-05 DIAGNOSIS — I700 Atherosclerosis of aorta: Secondary | ICD-10-CM | POA: Insufficient documentation

## 2018-07-05 DIAGNOSIS — N184 Chronic kidney disease, stage 4 (severe): Secondary | ICD-10-CM | POA: Insufficient documentation

## 2018-07-05 DIAGNOSIS — F319 Bipolar disorder, unspecified: Secondary | ICD-10-CM | POA: Insufficient documentation

## 2018-07-05 DIAGNOSIS — E782 Mixed hyperlipidemia: Secondary | ICD-10-CM | POA: Insufficient documentation

## 2018-07-05 DIAGNOSIS — G4733 Obstructive sleep apnea (adult) (pediatric): Secondary | ICD-10-CM | POA: Insufficient documentation

## 2018-07-05 DIAGNOSIS — D631 Anemia in chronic kidney disease: Secondary | ICD-10-CM | POA: Insufficient documentation

## 2018-07-05 DIAGNOSIS — Z79899 Other long term (current) drug therapy: Secondary | ICD-10-CM | POA: Insufficient documentation

## 2018-07-05 DIAGNOSIS — I2510 Atherosclerotic heart disease of native coronary artery without angina pectoris: Secondary | ICD-10-CM | POA: Insufficient documentation

## 2018-07-05 LAB — VITAMIN B12: Vitamin B-12: 959 pg/mL — ABNORMAL HIGH (ref 180–914)

## 2018-07-05 LAB — COMPREHENSIVE METABOLIC PANEL
ALT: 25 U/L (ref 0–44)
AST: 16 U/L (ref 15–41)
Albumin: 3.7 g/dL (ref 3.5–5.0)
Alkaline Phosphatase: 62 U/L (ref 38–126)
Anion gap: 8 (ref 5–15)
BUN: 43 mg/dL — ABNORMAL HIGH (ref 8–23)
CO2: 26 mmol/L (ref 22–32)
Calcium: 8.7 mg/dL — ABNORMAL LOW (ref 8.9–10.3)
Chloride: 106 mmol/L (ref 98–111)
Creatinine, Ser: 1.72 mg/dL — ABNORMAL HIGH (ref 0.44–1.00)
GFR calc Af Amer: 35 mL/min — ABNORMAL LOW (ref >60)
GFR calc non Af Amer: 30 mL/min — ABNORMAL LOW (ref >60)
Glucose, Bld: 168 mg/dL — ABNORMAL HIGH (ref 70–99)
Potassium: 5.5 mmol/L — ABNORMAL HIGH (ref 3.5–5.1)
Sodium: 140 mmol/L (ref 135–145)
Total Bilirubin: 0.4 mg/dL (ref 0.3–1.2)
Total Protein: 6.8 g/dL (ref 6.5–8.1)

## 2018-07-05 LAB — CBC WITH DIFFERENTIAL/PLATELET
Abs Immature Granulocytes: 0.02 10*3/uL (ref 0.00–0.07)
Basophils Absolute: 0.1 10*3/uL (ref 0.0–0.1)
Basophils Relative: 1 %
Eosinophils Absolute: 0.6 10*3/uL — ABNORMAL HIGH (ref 0.0–0.5)
Eosinophils Relative: 8 %
HCT: 32.5 % — ABNORMAL LOW (ref 36.0–46.0)
Hemoglobin: 10.1 g/dL — ABNORMAL LOW (ref 12.0–15.0)
Immature Granulocytes: 0 %
Lymphocytes Relative: 21 %
Lymphs Abs: 1.6 10*3/uL (ref 0.7–4.0)
MCH: 30.3 pg (ref 26.0–34.0)
MCHC: 31.1 g/dL (ref 30.0–36.0)
MCV: 97.6 fL (ref 80.0–100.0)
Monocytes Absolute: 0.5 10*3/uL (ref 0.1–1.0)
Monocytes Relative: 6 %
Neutro Abs: 4.7 10*3/uL (ref 1.7–7.7)
Neutrophils Relative %: 64 %
Platelets: 185 10*3/uL (ref 150–400)
RBC: 3.33 MIL/uL — ABNORMAL LOW (ref 3.87–5.11)
RDW: 13.8 % (ref 11.5–15.5)
WBC: 7.3 10*3/uL (ref 4.0–10.5)
nRBC: 0 % (ref 0.0–0.2)

## 2018-07-05 LAB — FOLATE: Folate: 60.3 ng/mL (ref >5.9)

## 2018-07-05 LAB — FERRITIN: Ferritin: 26 ng/mL (ref 11–307)

## 2018-07-05 LAB — IRON AND TIBC
Iron: 60 ug/dL (ref 28–170)
Saturation Ratios: 21 % (ref 10.4–31.8)
TIBC: 287 ug/dL (ref 250–450)
UIBC: 227 ug/dL

## 2018-07-06 LAB — VITAMIN D 25 HYDROXY (VIT D DEFICIENCY, FRACTURES): Vit D, 25-Hydroxy: 53.8 ng/mL (ref 30.0–100.0)

## 2018-07-12 ENCOUNTER — Other Ambulatory Visit (HOSPITAL_COMMUNITY): Payer: Self-pay | Admitting: Emergency Medicine

## 2018-07-12 ENCOUNTER — Other Ambulatory Visit: Payer: Self-pay

## 2018-07-12 ENCOUNTER — Inpatient Hospital Stay (HOSPITAL_BASED_OUTPATIENT_CLINIC_OR_DEPARTMENT_OTHER): Payer: Medicare Other | Admitting: Nurse Practitioner

## 2018-07-12 VITALS — BP 106/56 | HR 57 | Temp 99.1°F | Resp 18 | Wt 225.0 lb

## 2018-07-12 DIAGNOSIS — Z87891 Personal history of nicotine dependence: Secondary | ICD-10-CM | POA: Diagnosis not present

## 2018-07-12 DIAGNOSIS — Z79899 Other long term (current) drug therapy: Secondary | ICD-10-CM

## 2018-07-12 DIAGNOSIS — D631 Anemia in chronic kidney disease: Secondary | ICD-10-CM | POA: Diagnosis not present

## 2018-07-12 DIAGNOSIS — D649 Anemia, unspecified: Secondary | ICD-10-CM

## 2018-07-12 DIAGNOSIS — I129 Hypertensive chronic kidney disease with stage 1 through stage 4 chronic kidney disease, or unspecified chronic kidney disease: Secondary | ICD-10-CM

## 2018-07-12 DIAGNOSIS — Z794 Long term (current) use of insulin: Secondary | ICD-10-CM | POA: Diagnosis not present

## 2018-07-12 DIAGNOSIS — E1122 Type 2 diabetes mellitus with diabetic chronic kidney disease: Secondary | ICD-10-CM | POA: Diagnosis not present

## 2018-07-12 DIAGNOSIS — I2510 Atherosclerotic heart disease of native coronary artery without angina pectoris: Secondary | ICD-10-CM | POA: Diagnosis not present

## 2018-07-12 DIAGNOSIS — D472 Monoclonal gammopathy: Secondary | ICD-10-CM | POA: Diagnosis not present

## 2018-07-12 DIAGNOSIS — G4733 Obstructive sleep apnea (adult) (pediatric): Secondary | ICD-10-CM | POA: Diagnosis not present

## 2018-07-12 DIAGNOSIS — N184 Chronic kidney disease, stage 4 (severe): Secondary | ICD-10-CM

## 2018-07-12 DIAGNOSIS — E782 Mixed hyperlipidemia: Secondary | ICD-10-CM | POA: Diagnosis not present

## 2018-07-12 DIAGNOSIS — F319 Bipolar disorder, unspecified: Secondary | ICD-10-CM | POA: Diagnosis not present

## 2018-07-12 DIAGNOSIS — Z7982 Long term (current) use of aspirin: Secondary | ICD-10-CM | POA: Diagnosis not present

## 2018-07-12 LAB — POTASSIUM: Potassium: 4.8 mmol/L (ref 3.5–5.1)

## 2018-07-12 NOTE — Patient Instructions (Signed)
[facility] at [facility] Discharge Instructions  Follow-up in 4 months with repeat labs.   Thank you for choosing [facility] at [facility] to provide your oncology and hematology care.  To afford each patient quality time with our provider, please arrive at least 15 minutes before your scheduled appointment time.   If you have a lab appointment with the Cancer Center please come in thru the  Main Entrance and check in at the main information desk  You need to re-schedule your appointment should you arrive 10 or more minutes late.  We strive to give you quality time with our providers, and arriving late affects you and other patients whose appointments are after yours.  Also, if you no show three or more times for appointments you may be dismissed from the clinic at the providers discretion.     Again, thank you for choosing [facility].  Our hope is that these requests will decrease the amount of time that you wait before being seen by our physicians.       _____________________________________________________________  Should you have questions after your visit to [facility], please contact our office at [phone] between the hours of 8:00 a.m. and 4:30 p.m.  Voicemails left after 4:00 p.m. will not be returned until the following business day.  For prescription refill requests, have your pharmacy contact our office and allow 72 hours.    Cancer Center Support Programs:   > Cancer Support Group  2nd Tuesday of the month 1pm-2pm, Journey Room    

## 2018-07-12 NOTE — Assessment & Plan Note (Addendum)
1.  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  She was noted to have an elevated creatinine and was seen by nephrologist in December 2014.  She had an SPEP done as part of her work-up for kidney disease and was noted to have a M spike of 0.6 with normal lambda light chain ratio.  Immunofixation showed IgG kappa monoclonal protein.  Patient had hep C antibody which was negative, ANA negative and C ANCA negative, P-ANCA negative, anti-double-stranded DNA antibody negative and complements were within normal limits.  Her creatinine at that time was 1.32 and hemoglobin was 11.8.  Also she had no significant protein in her urine. -She also had a bone scan which was noted to be negative.  She never had a bone marrow biopsy done at this time. - Patient was being followed by Dr. Tressie Stalker at our clinic since early 2017. - Labs on 06/01/2018 shows potassium 5.1, creatinine 1.67, calcium 8.6, LFTs were normal, LDH 213, SPEP stable with M spike 0.6 g/dL.  WBC 7.8, hemoglobin 9.9, platelets 156.  Light chain ratio 1.82. -Labs on 07/05/2018 showed her potassium 5.5, creatinine 1.72, calcium 8.7, hemoglobin 10.1. -We repeated her potassium at her visit on 07/12/2018 which showed her potassium 4.8. - She reports no B symptoms including fevers, chills, night sweats, or unexplained weight loss. -Skeletal survey done on 07/05/2018 showed no lytic lesions. -She will follow-up in 3 to 4 weeks after her skeletal survey and labs.  2.  Anemia: - Likely due from CKD. -Labs on 07/05/2018 showed her hemoglobin 10.1, ferritin 26, percent saturation 21, creatinine 1.72 -We have discussed possible adverse reactions to IV iron therapy. -I recommended 2 infusions of IV iron. -We will see her back in 4 months with repeat labs.  3.  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 -Labs on 07/05/2018 showed her creatinine 1.72 - She has a follow-up appointment with her nephrologist next week.

## 2018-07-12 NOTE — Progress Notes (Signed)
[place], [place] [address]   CLINIC:  Medical Oncology/Hematology  PCP:  Monico Blitz, [place] [address] [phone]   REASON FOR VISIT: Follow-up for MGUS and anemia.  CURRENT THERAPY: Observation and intermittent iron infusions   INTERVAL HISTORY:  Christy [name] 68 y.o. female returns for routine follow-up for MGUS and anemia.  She reports she has been more fatigued than normal.  She is also short of breath with exertion.  She denies any bright red bleeding per rectum or melena. Denies any nausea, vomiting, or diarrhea. Denies any new pains. Had not noticed any recent bleeding such as epistaxis, hematuria or hematochezia. Denies recent chest pain on exertion, shortness of breath on minimal exertion, pre-syncopal episodes, or palpitations. Denies any numbness or tingling in hands or feet. Denies any recent fevers, infections, or recent hospitalizations. Patient reports appetite at 75 % and energy level at 25 %.  She is eating well and maintaining her weight at this time.    REVIEW OF SYSTEMS:  Review of Systems  Constitutional: Positive for fatigue.  Respiratory: Positive for shortness of breath.   All other systems reviewed and are negative.    PAST MEDICAL/SURGICAL HISTORY:  Past Medical History:  Diagnosis Date  . Bipolar disorder ([place])   . Coronary atherosclerosis of native coronary artery    BMS circ 2006, 70% PDA 2009  . Essential hypertension   . Mixed hyperlipidemia   . OSA on CPAP   . Renal tubular acidosis, type IV   . SVT (supraventricular tachycardia) ([place])   . Type 2 diabetes mellitus ([place])    Past Surgical History:  Procedure Laterality Date  . Acromioclavicular arthritis    . CARDIAC CATHETERIZATION N/A 03/29/2015   Procedure: Left Heart Cath and Coronary Angiography;  Surgeon: Troy Sine, MD;  Location: [facility];  Service: Cardiovascular;  Laterality: N/A;  . RIght shoulder adhesive  capsulitis    . Rotator cuff impingement syndrome    . Rotator cuff tear       SOCIAL HISTORY:  Social History   Socioeconomic History  . Marital status: Divorced    Spouse name: Not on file  . Number of children: Not on file  . Years of education: Not on file  . Highest education level: Not on file  Occupational History  . Occupation: PLANNING AND INSPECTOR    Employer: [employer]  Social Needs  . Financial resource strain: Not on file  . Food insecurity    Worry: Not on file    Inability: Not on file  . Transportation needs    Medical: Not on file    Non-medical: Not on file  Tobacco Use  . Smoking status: Former Smoker    Packs/day: 1.00    Years: 20.00    Pack years: 20.00    Types: Cigarettes    Start date: 10/30/1984    Quit date: 07/08/2013    Years since quitting: 5.0  . Smokeless tobacco: Never Used  Substance and Sexual Activity  . Alcohol use: No    Alcohol/week: 0.0 standard drinks  . Drug use: No  . Sexual activity: Not on file  Lifestyle  . Physical activity    Days per week: Not on file    Minutes per session: Not on file  . Stress: Not on file  Relationships  . Social Herbalist on phone: Not on file    Gets  together: Not on file    Attends religious service: Not on file    Active member of club or organization: Not on file    Attends meetings of clubs or organizations: Not on file    Relationship status: Not on file  . Intimate partner violence    Fear of current or ex partner: Not on file    Emotionally abused: Not on file    Physically abused: Not on file    Forced sexual activity: Not on file  Other Topics Concern  . Not on file  Social History Narrative   She has lived with some of her daughters. Works part-time for the city.     FAMILY HISTORY:  Family History  Problem Relation Age of Onset  . Bipolar disorder Brother   . Diabetes Father     CURRENT MEDICATIONS:  Outpatient Encounter Medications as of 07/12/2018   Medication Sig  . allopurinol (ZYLOPRIM) 300 MG tablet Take 150 mg by mouth daily.  Marland Kitchen amLODipine (NORVASC) 10 MG tablet Take 10 mg by mouth daily.  Marland Kitchen aspirin 81 MG EC tablet Take 81 mg by mouth daily.    Marland Kitchen BESIVANCE 0.6 % SUSP INSTILL ONE DROP IN THE LEFT EYE FOUR TIMES DAILY FOR 2 DAYS AFTER EACH MONTHLY EYE INJECTION  . buPROPion (WELLBUTRIN XL) 300 MG 24 hr tablet Take 300 mg by mouth daily.  . calcium carbonate (OS-CAL) 600 MG TABS Take 600 mg by mouth daily.    . Cholecalciferol (VITAMIN D3) 25 MCG (1000 UT) CAPS Take 1 capsule by mouth daily.  . divalproex (DEPAKOTE) 250 MG EC tablet Take 250 mg by mouth daily.    . hydrALAZINE (APRESOLINE) 25 MG tablet Take 1 tablet (25 mg total) by mouth 2 (two) times daily.  . insulin aspart (NOVOLOG) 100 UNIT/ML injection Inject into the skin. 10 units before bedtime meal  . insulin degludec (TRESIBA) 100 UNIT/ML SOPN FlexTouch Pen Inject 52 Units into the skin daily.   . isosorbide mononitrate (IMDUR) 60 MG 24 hr tablet Take 1.5 tablets (90 mg total) by mouth daily.  Marland Kitchen labetalol (NORMODYNE) 100 MG tablet Take 100 mg by mouth 2 (two) times daily.  Marland Kitchen labetalol (NORMODYNE) 300 MG tablet TAKE 1 TABLET BY MOUTH TWICE DAILY  . Liraglutide (VICTOZA) 18 MG/3ML SOLN Inject 1.2 mg into the skin daily.   . meclizine (ANTIVERT) 25 MG tablet Take 25 mg by mouth as needed.   . Multiple Vitamin (MULTIVITAMIN) tablet Take 1 tablet by mouth daily.    . nitroGLYCERIN (NITROLINGUAL) 0.4 MG/SPRAY spray Place 1 spray under the tongue every 5 (five) minutes x 3 doses as needed.  . rosuvastatin (CRESTOR) 10 MG tablet Take 10 mg by mouth daily.  . sertraline (ZOLOFT) 100 MG tablet Take 100 mg by mouth daily.    Marland Kitchen torsemide (DEMADEX) 20 MG tablet Take 3 tablets (60 mg total) by mouth every morning.  . traZODone (DESYREL) 100 MG tablet TAKE ONE TAB BY MOUTH AT BEDTIME  . TRESIBA FLEXTOUCH 200 UNIT/ML SOPN Inject 62 Units into the skin every morning.    No  facility-administered encounter medications on file as of 07/12/2018.     ALLERGIES:  Allergies  Allergen Reactions  . Ace Inhibitors   . Lisinopril     Severe cough  . Penicillins   . Tetracycline   . Lamictal [Lamotrigine] Rash    Rash start inside to outter body.     PHYSICAL EXAM:  ECOG Performance status: 1  Vitals:   07/12/18 1339  BP: (!) 106/56  Pulse: (!) 57  Resp: 18  Temp: 99.1 F (37.3 C)  SpO2: 96%   Filed Weights   07/12/18 1339  Weight: 225 lb (102.1 kg)    Physical Exam Constitutional:      Appearance: Normal appearance. She is normal weight.  Cardiovascular:     Rate and Rhythm: Normal rate and regular rhythm.     Heart sounds: Normal heart sounds.  Pulmonary:     Effort: Pulmonary effort is normal.     Breath sounds: Normal breath sounds.  Abdominal:     General: Bowel sounds are normal.     Palpations: Abdomen is soft.  Musculoskeletal: Normal range of motion.  Skin:    General: Skin is warm and dry.  Neurological:     Mental Status: She is alert and oriented to person, place, and time. Mental status is at baseline.  Psychiatric:        Mood and Affect: Mood normal.        Behavior: Behavior normal.        Thought Content: Thought content normal.        Judgment: Judgment normal.      LABORATORY DATA:  I have reviewed the labs as listed.  CBC    Component Value Date/Time   WBC 7.3 07/05/2018 1330   RBC 3.33 (L) 07/05/2018 1330   HGB 10.1 (L) 07/05/2018 1330   HCT 32.5 (L) 07/05/2018 1330   PLT 185 07/05/2018 1330   MCV 97.6 07/05/2018 1330   MCH 30.3 07/05/2018 1330   MCHC 31.1 07/05/2018 1330   RDW 13.8 07/05/2018 1330   LYMPHSABS 1.6 07/05/2018 1330   MONOABS 0.5 07/05/2018 1330   EOSABS 0.6 (H) 07/05/2018 1330   BASOSABS 0.1 07/05/2018 1330   CMP Latest Ref Rng & Units 07/12/2018 07/05/2018 06/01/2018  Glucose 70 - 99 mg/dL - 168(H) 149(H)  BUN 8 - 23 mg/dL - 43(H) 34(H)  Creatinine 0.44 - 1.00 mg/dL - 1.72(H) 1.67(H)   Sodium 135 - 145 mmol/L - 140 140  Potassium 3.5 - 5.1 mmol/L 4.8 5.5(H) 5.1  Chloride 98 - 111 mmol/L - 106 104  CO2 22 - 32 mmol/L - 26 25  Calcium 8.9 - 10.3 mg/dL - 8.7(L) 8.6(L)  Total Protein 6.5 - 8.1 g/dL - 6.8 6.6  Total Bilirubin 0.3 - 1.2 mg/dL - 0.4 0.3  Alkaline Phos 38 - 126 U/L - 62 60  AST 15 - 41 U/L - 16 20  ALT 0 - 44 U/L - 25 20     DIAGNOSTIC IMAGING:  I have independently reviewed the bone scans and discussed with the patient.    I personally performed a face-to-face visit.  All questions were answered to patient's stated satisfaction. Encouraged patient to call with any new concerns or questions before his next visit to the cancer center and we can certain see him sooner, if needed.     ASSESSMENT & PLAN:   MGUS (monoclonal gammopathy of unknown significance) 1.  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  She was noted to have an elevated creatinine and was seen by nephrologist in December 2014.  She had an SPEP done as part of her work-up for kidney disease and was noted to have a M spike of 0.6 with normal lambda light chain ratio.  Immunofixation showed IgG kappa monoclonal protein.  Patient had hep C antibody  which was negative, ANA negative and C ANCA negative, P-ANCA negative, anti-double-stranded DNA antibody negative and complements were within normal limits.  Her creatinine at that time was 1.32 and hemoglobin was 11.8.  Also she had no significant protein in her urine. -She also had a bone scan which was noted to be negative.  She never had a bone marrow biopsy done at this time. - Patient was being followed by Dr. Tressie Stalker at our clinic since early 2017. - Labs on 06/01/2018 shows potassium 5.1, creatinine 1.67, calcium 8.6, LFTs were normal, LDH 213, SPEP stable with M spike 0.6 g/dL.  WBC 7.8, hemoglobin 9.9, platelets 156.  Light chain ratio 1.82. -Labs on 07/05/2018  showed her potassium 5.5, creatinine 1.72, calcium 8.7, hemoglobin 10.1. -We repeated her potassium at her visit on 07/12/2018 which showed her potassium 4.8. - She reports no B symptoms including fevers, chills, night sweats, or unexplained weight loss. -Skeletal survey done on 07/05/2018 showed no lytic lesions. -She will follow-up in 3 to 4 weeks after her skeletal survey and labs.  2.  Anemia: - Likely due from CKD. -Labs on 07/05/2018 showed her hemoglobin 10.1, ferritin 26, percent saturation 21, creatinine 1.72 -We have discussed possible adverse reactions to IV iron therapy. -I recommended 2 infusions of IV iron. -We will see her back in 4 months with repeat labs.  3.  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 -Labs on 07/05/2018 showed her creatinine 1.72 - She has a follow-up appointment with her nephrologist next week.      Orders placed this encounter:  Orders Placed This Encounter  Procedures  . CBC with Differential/Platelet  . Comprehensive metabolic panel  . Ferritin  . Iron and TIBC  . VITAMIN D 25 Hydroxy (Vit-D Deficiency, Fractures)  . Vitamin B12  . Folate  . Lactate dehydrogenase  . Protein electrophoresis, serum  . Kappa/lambda light chains  . IgG, IgA, IgM      Francene Finders, FNP-C [place] [phone]

## 2018-07-14 DIAGNOSIS — L609 Nail disorder, unspecified: Secondary | ICD-10-CM | POA: Diagnosis not present

## 2018-07-14 DIAGNOSIS — L110 Acquired keratosis follicularis: Secondary | ICD-10-CM | POA: Diagnosis not present

## 2018-07-14 DIAGNOSIS — E1140 Type 2 diabetes mellitus with diabetic neuropathy, unspecified: Secondary | ICD-10-CM | POA: Diagnosis not present

## 2018-07-18 ENCOUNTER — Inpatient Hospital Stay (HOSPITAL_COMMUNITY): Payer: Medicare Other

## 2018-07-18 ENCOUNTER — Other Ambulatory Visit: Payer: Self-pay

## 2018-07-18 ENCOUNTER — Encounter (HOSPITAL_COMMUNITY): Payer: Self-pay

## 2018-07-18 VITALS — BP 136/52 | HR 61 | Temp 97.8°F | Resp 16

## 2018-07-18 DIAGNOSIS — N184 Chronic kidney disease, stage 4 (severe): Secondary | ICD-10-CM | POA: Diagnosis not present

## 2018-07-18 DIAGNOSIS — D649 Anemia, unspecified: Secondary | ICD-10-CM

## 2018-07-18 DIAGNOSIS — D472 Monoclonal gammopathy: Secondary | ICD-10-CM | POA: Diagnosis not present

## 2018-07-18 DIAGNOSIS — D631 Anemia in chronic kidney disease: Secondary | ICD-10-CM | POA: Diagnosis not present

## 2018-07-18 DIAGNOSIS — E782 Mixed hyperlipidemia: Secondary | ICD-10-CM | POA: Diagnosis not present

## 2018-07-18 DIAGNOSIS — E1122 Type 2 diabetes mellitus with diabetic chronic kidney disease: Secondary | ICD-10-CM | POA: Diagnosis not present

## 2018-07-18 DIAGNOSIS — I129 Hypertensive chronic kidney disease with stage 1 through stage 4 chronic kidney disease, or unspecified chronic kidney disease: Secondary | ICD-10-CM | POA: Diagnosis not present

## 2018-07-18 MED ORDER — SODIUM CHLORIDE 0.9 % IV SOLN
510.0000 mg | Freq: Once | INTRAVENOUS | Status: AC
  Administered 2018-07-18: 15:00:00 510 mg via INTRAVENOUS
  Filled 2018-07-18: qty 510

## 2018-07-18 MED ORDER — SODIUM CHLORIDE 0.9 % IV SOLN
Freq: Once | INTRAVENOUS | Status: AC
  Administered 2018-07-18: 14:00:00 via INTRAVENOUS

## 2018-07-18 NOTE — Patient Instructions (Signed)
[facility] at [facility]  Discharge Instructions:   _______________________________________________________________  Thank you for choosing [facility] at [facility] to provide your oncology and hematology care.  To afford each patient quality time with our providers, please arrive at least 15 minutes before your scheduled appointment.  You need to re-schedule your appointment if you arrive 10 or more minutes late.  We strive to give you quality time with our providers, and arriving late affects you and other patients whose appointments are after yours.  Also, if you no show three or more times for appointments you may be dismissed from the clinic.  Again, thank you for choosing [facility] at [facility]. Our hope is that these requests will allow you access to exceptional care and in a timely manner. _______________________________________________________________  If you have questions after your visit, please contact our office at [phone] between the hours of 8:30 a.m. and 5:00 p.m. Voicemails left after 4:30 p.m. will not be returned until the following business day. _______________________________________________________________  For prescription refill requests, have your pharmacy contact our office. _______________________________________________________________  Recommendations made by the consultant and any test results will be sent to your referring physician. _______________________________________________________________ 

## 2018-07-18 NOTE — Progress Notes (Signed)
Feraheme given per orders. Patient tolerated it well without problems. Vitals stable and discharged home from clinic ambulatory. Follow up as scheduled.  

## 2018-07-19 DIAGNOSIS — D509 Iron deficiency anemia, unspecified: Secondary | ICD-10-CM | POA: Diagnosis not present

## 2018-07-19 DIAGNOSIS — N183 Chronic kidney disease, stage 3 (moderate): Secondary | ICD-10-CM | POA: Diagnosis not present

## 2018-07-19 DIAGNOSIS — E559 Vitamin D deficiency, unspecified: Secondary | ICD-10-CM | POA: Diagnosis not present

## 2018-07-19 DIAGNOSIS — R809 Proteinuria, unspecified: Secondary | ICD-10-CM | POA: Diagnosis not present

## 2018-07-19 DIAGNOSIS — Z79899 Other long term (current) drug therapy: Secondary | ICD-10-CM | POA: Diagnosis not present

## 2018-07-19 DIAGNOSIS — I129 Hypertensive chronic kidney disease with stage 1 through stage 4 chronic kidney disease, or unspecified chronic kidney disease: Secondary | ICD-10-CM | POA: Diagnosis not present

## 2018-07-20 DIAGNOSIS — I10 Essential (primary) hypertension: Secondary | ICD-10-CM | POA: Diagnosis not present

## 2018-07-20 DIAGNOSIS — Z6841 Body Mass Index (BMI) 40.0 and over, adult: Secondary | ICD-10-CM | POA: Diagnosis not present

## 2018-07-20 DIAGNOSIS — N183 Chronic kidney disease, stage 3 (moderate): Secondary | ICD-10-CM | POA: Diagnosis not present

## 2018-07-20 DIAGNOSIS — E1122 Type 2 diabetes mellitus with diabetic chronic kidney disease: Secondary | ICD-10-CM | POA: Diagnosis not present

## 2018-07-20 DIAGNOSIS — Z299 Encounter for prophylactic measures, unspecified: Secondary | ICD-10-CM | POA: Diagnosis not present

## 2018-07-20 DIAGNOSIS — E1165 Type 2 diabetes mellitus with hyperglycemia: Secondary | ICD-10-CM | POA: Diagnosis not present

## 2018-07-21 DIAGNOSIS — E7800 Pure hypercholesterolemia, unspecified: Secondary | ICD-10-CM | POA: Diagnosis not present

## 2018-07-21 DIAGNOSIS — I2510 Atherosclerotic heart disease of native coronary artery without angina pectoris: Secondary | ICD-10-CM | POA: Diagnosis not present

## 2018-07-21 DIAGNOSIS — E119 Type 2 diabetes mellitus without complications: Secondary | ICD-10-CM | POA: Diagnosis not present

## 2018-07-21 DIAGNOSIS — I10 Essential (primary) hypertension: Secondary | ICD-10-CM | POA: Diagnosis not present

## 2018-07-25 ENCOUNTER — Other Ambulatory Visit: Payer: Self-pay

## 2018-07-25 ENCOUNTER — Inpatient Hospital Stay (HOSPITAL_COMMUNITY): Payer: Medicare Other | Attending: Hematology

## 2018-07-25 VITALS — BP 144/42 | HR 58 | Temp 97.3°F | Resp 16

## 2018-07-25 DIAGNOSIS — I2510 Atherosclerotic heart disease of native coronary artery without angina pectoris: Secondary | ICD-10-CM | POA: Insufficient documentation

## 2018-07-25 DIAGNOSIS — Z7982 Long term (current) use of aspirin: Secondary | ICD-10-CM | POA: Insufficient documentation

## 2018-07-25 DIAGNOSIS — D649 Anemia, unspecified: Secondary | ICD-10-CM

## 2018-07-25 DIAGNOSIS — Z79899 Other long term (current) drug therapy: Secondary | ICD-10-CM | POA: Diagnosis not present

## 2018-07-25 DIAGNOSIS — I129 Hypertensive chronic kidney disease with stage 1 through stage 4 chronic kidney disease, or unspecified chronic kidney disease: Secondary | ICD-10-CM | POA: Diagnosis not present

## 2018-07-25 DIAGNOSIS — D631 Anemia in chronic kidney disease: Secondary | ICD-10-CM | POA: Insufficient documentation

## 2018-07-25 DIAGNOSIS — N184 Chronic kidney disease, stage 4 (severe): Secondary | ICD-10-CM | POA: Insufficient documentation

## 2018-07-25 DIAGNOSIS — E782 Mixed hyperlipidemia: Secondary | ICD-10-CM | POA: Diagnosis not present

## 2018-07-25 DIAGNOSIS — E1122 Type 2 diabetes mellitus with diabetic chronic kidney disease: Secondary | ICD-10-CM | POA: Diagnosis not present

## 2018-07-25 DIAGNOSIS — F319 Bipolar disorder, unspecified: Secondary | ICD-10-CM | POA: Insufficient documentation

## 2018-07-25 DIAGNOSIS — Z87891 Personal history of nicotine dependence: Secondary | ICD-10-CM | POA: Insufficient documentation

## 2018-07-25 DIAGNOSIS — D472 Monoclonal gammopathy: Secondary | ICD-10-CM | POA: Insufficient documentation

## 2018-07-25 DIAGNOSIS — G4733 Obstructive sleep apnea (adult) (pediatric): Secondary | ICD-10-CM | POA: Insufficient documentation

## 2018-07-25 DIAGNOSIS — Z794 Long term (current) use of insulin: Secondary | ICD-10-CM | POA: Diagnosis not present

## 2018-07-25 MED ORDER — SODIUM CHLORIDE 0.9 % IV SOLN
510.0000 mg | Freq: Once | INTRAVENOUS | Status: AC
  Administered 2018-07-25: 510 mg via INTRAVENOUS
  Filled 2018-07-25: qty 510

## 2018-07-25 MED ORDER — SODIUM CHLORIDE 0.9 % IV SOLN
Freq: Once | INTRAVENOUS | Status: AC
  Administered 2018-07-25: 15:00:00 via INTRAVENOUS

## 2018-07-25 NOTE — Progress Notes (Signed)
Feraheme given per orders. Patient tolerated it well without problems. Vitals stable and discharged home from clinic ambulatory. Follow up as scheduled.  

## 2018-07-26 DIAGNOSIS — I10 Essential (primary) hypertension: Secondary | ICD-10-CM | POA: Diagnosis not present

## 2018-07-26 DIAGNOSIS — E875 Hyperkalemia: Secondary | ICD-10-CM | POA: Diagnosis not present

## 2018-07-26 DIAGNOSIS — N183 Chronic kidney disease, stage 3 (moderate): Secondary | ICD-10-CM | POA: Diagnosis not present

## 2018-07-26 DIAGNOSIS — E1121 Type 2 diabetes mellitus with diabetic nephropathy: Secondary | ICD-10-CM | POA: Diagnosis not present

## 2018-07-26 DIAGNOSIS — R809 Proteinuria, unspecified: Secondary | ICD-10-CM | POA: Diagnosis not present

## 2018-07-26 DIAGNOSIS — D638 Anemia in other chronic diseases classified elsewhere: Secondary | ICD-10-CM | POA: Diagnosis not present

## 2018-07-27 DIAGNOSIS — E1165 Type 2 diabetes mellitus with hyperglycemia: Secondary | ICD-10-CM | POA: Diagnosis not present

## 2018-07-27 DIAGNOSIS — E1142 Type 2 diabetes mellitus with diabetic polyneuropathy: Secondary | ICD-10-CM | POA: Diagnosis not present

## 2018-07-27 DIAGNOSIS — I10 Essential (primary) hypertension: Secondary | ICD-10-CM | POA: Diagnosis not present

## 2018-07-27 DIAGNOSIS — Z299 Encounter for prophylactic measures, unspecified: Secondary | ICD-10-CM | POA: Diagnosis not present

## 2018-07-27 DIAGNOSIS — R21 Rash and other nonspecific skin eruption: Secondary | ICD-10-CM | POA: Diagnosis not present

## 2018-07-27 DIAGNOSIS — Z6841 Body Mass Index (BMI) 40.0 and over, adult: Secondary | ICD-10-CM | POA: Diagnosis not present

## 2018-08-03 DIAGNOSIS — F4310 Post-traumatic stress disorder, unspecified: Secondary | ICD-10-CM | POA: Diagnosis not present

## 2018-08-03 DIAGNOSIS — F3112 Bipolar disorder, current episode manic without psychotic features, moderate: Secondary | ICD-10-CM | POA: Diagnosis not present

## 2018-08-03 DIAGNOSIS — F411 Generalized anxiety disorder: Secondary | ICD-10-CM | POA: Diagnosis not present

## 2018-08-26 DIAGNOSIS — E119 Type 2 diabetes mellitus without complications: Secondary | ICD-10-CM | POA: Diagnosis not present

## 2018-08-26 DIAGNOSIS — E7800 Pure hypercholesterolemia, unspecified: Secondary | ICD-10-CM | POA: Diagnosis not present

## 2018-08-26 DIAGNOSIS — I2510 Atherosclerotic heart disease of native coronary artery without angina pectoris: Secondary | ICD-10-CM | POA: Diagnosis not present

## 2018-08-26 DIAGNOSIS — I10 Essential (primary) hypertension: Secondary | ICD-10-CM | POA: Diagnosis not present

## 2018-09-07 ENCOUNTER — Encounter (INDEPENDENT_AMBULATORY_CARE_PROVIDER_SITE_OTHER): Payer: Medicare Other | Admitting: Ophthalmology

## 2018-09-07 ENCOUNTER — Other Ambulatory Visit: Payer: Self-pay

## 2018-09-07 DIAGNOSIS — E11311 Type 2 diabetes mellitus with unspecified diabetic retinopathy with macular edema: Secondary | ICD-10-CM

## 2018-09-07 DIAGNOSIS — H35033 Hypertensive retinopathy, bilateral: Secondary | ICD-10-CM

## 2018-09-07 DIAGNOSIS — E113391 Type 2 diabetes mellitus with moderate nonproliferative diabetic retinopathy without macular edema, right eye: Secondary | ICD-10-CM

## 2018-09-07 DIAGNOSIS — I10 Essential (primary) hypertension: Secondary | ICD-10-CM | POA: Diagnosis not present

## 2018-09-07 DIAGNOSIS — H43813 Vitreous degeneration, bilateral: Secondary | ICD-10-CM | POA: Diagnosis not present

## 2018-09-07 DIAGNOSIS — E113312 Type 2 diabetes mellitus with moderate nonproliferative diabetic retinopathy with macular edema, left eye: Secondary | ICD-10-CM | POA: Diagnosis not present

## 2018-09-19 ENCOUNTER — Other Ambulatory Visit: Payer: Self-pay | Admitting: Cardiology

## 2018-09-23 DIAGNOSIS — E119 Type 2 diabetes mellitus without complications: Secondary | ICD-10-CM | POA: Diagnosis not present

## 2018-09-23 DIAGNOSIS — I10 Essential (primary) hypertension: Secondary | ICD-10-CM | POA: Diagnosis not present

## 2018-09-23 DIAGNOSIS — E7800 Pure hypercholesterolemia, unspecified: Secondary | ICD-10-CM | POA: Diagnosis not present

## 2018-09-23 DIAGNOSIS — I2510 Atherosclerotic heart disease of native coronary artery without angina pectoris: Secondary | ICD-10-CM | POA: Diagnosis not present

## 2018-09-28 DIAGNOSIS — Z23 Encounter for immunization: Secondary | ICD-10-CM | POA: Diagnosis not present

## 2018-10-04 DIAGNOSIS — L110 Acquired keratosis follicularis: Secondary | ICD-10-CM | POA: Diagnosis not present

## 2018-10-04 DIAGNOSIS — L609 Nail disorder, unspecified: Secondary | ICD-10-CM | POA: Diagnosis not present

## 2018-10-04 DIAGNOSIS — E1140 Type 2 diabetes mellitus with diabetic neuropathy, unspecified: Secondary | ICD-10-CM | POA: Diagnosis not present

## 2018-10-25 DIAGNOSIS — Z79899 Other long term (current) drug therapy: Secondary | ICD-10-CM | POA: Diagnosis not present

## 2018-10-25 DIAGNOSIS — E1121 Type 2 diabetes mellitus with diabetic nephropathy: Secondary | ICD-10-CM | POA: Diagnosis not present

## 2018-10-25 DIAGNOSIS — E875 Hyperkalemia: Secondary | ICD-10-CM | POA: Diagnosis not present

## 2018-10-25 DIAGNOSIS — I129 Hypertensive chronic kidney disease with stage 1 through stage 4 chronic kidney disease, or unspecified chronic kidney disease: Secondary | ICD-10-CM | POA: Diagnosis not present

## 2018-10-25 DIAGNOSIS — N183 Chronic kidney disease, stage 3 unspecified: Secondary | ICD-10-CM | POA: Diagnosis not present

## 2018-10-28 DIAGNOSIS — E875 Hyperkalemia: Secondary | ICD-10-CM | POA: Diagnosis not present

## 2018-10-28 DIAGNOSIS — D631 Anemia in chronic kidney disease: Secondary | ICD-10-CM | POA: Diagnosis not present

## 2018-10-28 DIAGNOSIS — E1122 Type 2 diabetes mellitus with diabetic chronic kidney disease: Secondary | ICD-10-CM | POA: Diagnosis not present

## 2018-10-28 DIAGNOSIS — R809 Proteinuria, unspecified: Secondary | ICD-10-CM | POA: Diagnosis not present

## 2018-10-28 DIAGNOSIS — E211 Secondary hyperparathyroidism, not elsewhere classified: Secondary | ICD-10-CM | POA: Diagnosis not present

## 2018-10-28 DIAGNOSIS — N189 Chronic kidney disease, unspecified: Secondary | ICD-10-CM | POA: Diagnosis not present

## 2018-10-28 DIAGNOSIS — D472 Monoclonal gammopathy: Secondary | ICD-10-CM | POA: Diagnosis not present

## 2018-10-28 DIAGNOSIS — E1129 Type 2 diabetes mellitus with other diabetic kidney complication: Secondary | ICD-10-CM | POA: Diagnosis not present

## 2018-10-28 DIAGNOSIS — E559 Vitamin D deficiency, unspecified: Secondary | ICD-10-CM | POA: Diagnosis not present

## 2018-11-01 ENCOUNTER — Other Ambulatory Visit: Payer: Self-pay | Admitting: Cardiology

## 2018-11-01 ENCOUNTER — Other Ambulatory Visit (HOSPITAL_COMMUNITY): Payer: Medicare Other

## 2018-11-02 DIAGNOSIS — I10 Essential (primary) hypertension: Secondary | ICD-10-CM | POA: Diagnosis not present

## 2018-11-02 DIAGNOSIS — E1165 Type 2 diabetes mellitus with hyperglycemia: Secondary | ICD-10-CM | POA: Diagnosis not present

## 2018-11-02 DIAGNOSIS — Z299 Encounter for prophylactic measures, unspecified: Secondary | ICD-10-CM | POA: Diagnosis not present

## 2018-11-02 DIAGNOSIS — Z6841 Body Mass Index (BMI) 40.0 and over, adult: Secondary | ICD-10-CM | POA: Diagnosis not present

## 2018-11-02 DIAGNOSIS — E1122 Type 2 diabetes mellitus with diabetic chronic kidney disease: Secondary | ICD-10-CM | POA: Diagnosis not present

## 2018-11-02 DIAGNOSIS — I509 Heart failure, unspecified: Secondary | ICD-10-CM | POA: Diagnosis not present

## 2018-11-08 ENCOUNTER — Other Ambulatory Visit: Payer: Self-pay

## 2018-11-08 ENCOUNTER — Inpatient Hospital Stay (HOSPITAL_COMMUNITY): Payer: Medicare Other | Attending: Hematology

## 2018-11-08 DIAGNOSIS — N184 Chronic kidney disease, stage 4 (severe): Secondary | ICD-10-CM | POA: Insufficient documentation

## 2018-11-08 DIAGNOSIS — E1122 Type 2 diabetes mellitus with diabetic chronic kidney disease: Secondary | ICD-10-CM | POA: Diagnosis not present

## 2018-11-08 DIAGNOSIS — I129 Hypertensive chronic kidney disease with stage 1 through stage 4 chronic kidney disease, or unspecified chronic kidney disease: Secondary | ICD-10-CM | POA: Diagnosis not present

## 2018-11-08 DIAGNOSIS — Z794 Long term (current) use of insulin: Secondary | ICD-10-CM | POA: Diagnosis not present

## 2018-11-08 DIAGNOSIS — D631 Anemia in chronic kidney disease: Secondary | ICD-10-CM | POA: Insufficient documentation

## 2018-11-08 DIAGNOSIS — Z87891 Personal history of nicotine dependence: Secondary | ICD-10-CM | POA: Insufficient documentation

## 2018-11-08 DIAGNOSIS — E782 Mixed hyperlipidemia: Secondary | ICD-10-CM | POA: Insufficient documentation

## 2018-11-08 DIAGNOSIS — Z7982 Long term (current) use of aspirin: Secondary | ICD-10-CM | POA: Diagnosis not present

## 2018-11-08 DIAGNOSIS — D649 Anemia, unspecified: Secondary | ICD-10-CM

## 2018-11-08 DIAGNOSIS — D472 Monoclonal gammopathy: Secondary | ICD-10-CM | POA: Insufficient documentation

## 2018-11-08 LAB — COMPREHENSIVE METABOLIC PANEL
ALT: 19 U/L (ref 0–44)
AST: 19 U/L (ref 15–41)
Albumin: 3.6 g/dL (ref 3.5–5.0)
Alkaline Phosphatase: 50 U/L (ref 38–126)
Anion gap: 8 (ref 5–15)
BUN: 45 mg/dL — ABNORMAL HIGH (ref 8–23)
CO2: 24 mmol/L (ref 22–32)
Calcium: 8.3 mg/dL — ABNORMAL LOW (ref 8.9–10.3)
Chloride: 104 mmol/L (ref 98–111)
Creatinine, Ser: 1.9 mg/dL — ABNORMAL HIGH (ref 0.44–1.00)
GFR calc Af Amer: 31 mL/min — ABNORMAL LOW (ref >60)
GFR calc non Af Amer: 26 mL/min — ABNORMAL LOW (ref >60)
Glucose, Bld: 164 mg/dL — ABNORMAL HIGH (ref 70–99)
Potassium: 5 mmol/L (ref 3.5–5.1)
Sodium: 136 mmol/L (ref 135–145)
Total Bilirubin: 0.3 mg/dL (ref 0.3–1.2)
Total Protein: 6.5 g/dL (ref 6.5–8.1)

## 2018-11-08 LAB — CBC WITH DIFFERENTIAL/PLATELET
Abs Immature Granulocytes: 0.01 10*3/uL (ref 0.00–0.07)
Basophils Absolute: 0.1 10*3/uL (ref 0.0–0.1)
Basophils Relative: 1 %
Eosinophils Absolute: 0.5 10*3/uL (ref 0.0–0.5)
Eosinophils Relative: 8 %
HCT: 31.3 % — ABNORMAL LOW (ref 36.0–46.0)
Hemoglobin: 9.8 g/dL — ABNORMAL LOW (ref 12.0–15.0)
Immature Granulocytes: 0 %
Lymphocytes Relative: 23 %
Lymphs Abs: 1.4 10*3/uL (ref 0.7–4.0)
MCH: 31.5 pg (ref 26.0–34.0)
MCHC: 31.3 g/dL (ref 30.0–36.0)
MCV: 100.6 fL — ABNORMAL HIGH (ref 80.0–100.0)
Monocytes Absolute: 0.5 10*3/uL (ref 0.1–1.0)
Monocytes Relative: 7 %
Neutro Abs: 3.8 10*3/uL (ref 1.7–7.7)
Neutrophils Relative %: 61 %
Platelets: 152 10*3/uL (ref 150–400)
RBC: 3.11 MIL/uL — ABNORMAL LOW (ref 3.87–5.11)
RDW: 13.4 % (ref 11.5–15.5)
WBC: 6.2 10*3/uL (ref 4.0–10.5)
nRBC: 0 % (ref 0.0–0.2)

## 2018-11-08 LAB — VITAMIN B12: Vitamin B-12: 820 pg/mL (ref 180–914)

## 2018-11-08 LAB — IRON AND TIBC
Iron: 76 ug/dL (ref 28–170)
Saturation Ratios: 30 % (ref 10.4–31.8)
TIBC: 250 ug/dL (ref 250–450)
UIBC: 174 ug/dL

## 2018-11-08 LAB — FERRITIN: Ferritin: 123 ng/mL (ref 11–307)

## 2018-11-08 LAB — LACTATE DEHYDROGENASE: LDH: 209 U/L — ABNORMAL HIGH (ref 98–192)

## 2018-11-08 LAB — VITAMIN D 25 HYDROXY (VIT D DEFICIENCY, FRACTURES): Vit D, 25-Hydroxy: 31.74 ng/mL (ref 30–100)

## 2018-11-08 LAB — FOLATE: Folate: 58 ng/mL (ref >5.9)

## 2018-11-09 LAB — KAPPA/LAMBDA LIGHT CHAINS
Kappa free light chain: 84.1 mg/L — ABNORMAL HIGH (ref 3.3–19.4)
Kappa, lambda light chain ratio: 1.62 (ref 0.26–1.65)
Lambda free light chains: 51.9 mg/L — ABNORMAL HIGH (ref 5.7–26.3)

## 2018-11-10 LAB — PROTEIN ELECTROPHORESIS, SERUM
A/G Ratio: 1.3 (ref 0.7–1.7)
Albumin ELP: 3.5 g/dL (ref 2.9–4.4)
Alpha-1-Globulin: 0.3 g/dL (ref 0.0–0.4)
Alpha-2-Globulin: 0.6 g/dL (ref 0.4–1.0)
Beta Globulin: 0.8 g/dL (ref 0.7–1.3)
Gamma Globulin: 1.1 g/dL (ref 0.4–1.8)
Globulin, Total: 2.7 g/dL (ref 2.2–3.9)
M-Spike, %: 0.5 g/dL — ABNORMAL HIGH
Total Protein ELP: 6.2 g/dL (ref 6.0–8.5)

## 2018-11-10 LAB — IGG, IGA, IGM
IgA: 145 mg/dL (ref 87–352)
IgG (Immunoglobin G), Serum: 1168 mg/dL (ref 586–1602)
IgM (Immunoglobulin M), Srm: 213 mg/dL (ref 26–217)

## 2018-11-15 ENCOUNTER — Ambulatory Visit (HOSPITAL_COMMUNITY): Payer: Medicare Other | Admitting: Nurse Practitioner

## 2018-11-16 ENCOUNTER — Other Ambulatory Visit: Payer: Self-pay

## 2018-11-17 ENCOUNTER — Encounter (HOSPITAL_COMMUNITY): Payer: Self-pay | Admitting: Hematology

## 2018-11-17 ENCOUNTER — Inpatient Hospital Stay (HOSPITAL_BASED_OUTPATIENT_CLINIC_OR_DEPARTMENT_OTHER): Payer: Medicare Other | Admitting: Hematology

## 2018-11-17 VITALS — BP 125/40 | HR 56 | Temp 97.8°F | Resp 20 | Wt 235.9 lb

## 2018-11-17 DIAGNOSIS — D472 Monoclonal gammopathy: Secondary | ICD-10-CM

## 2018-11-17 DIAGNOSIS — I10 Essential (primary) hypertension: Secondary | ICD-10-CM | POA: Diagnosis not present

## 2018-11-17 DIAGNOSIS — E782 Mixed hyperlipidemia: Secondary | ICD-10-CM | POA: Diagnosis not present

## 2018-11-17 DIAGNOSIS — D631 Anemia in chronic kidney disease: Secondary | ICD-10-CM | POA: Diagnosis not present

## 2018-11-17 DIAGNOSIS — E1122 Type 2 diabetes mellitus with diabetic chronic kidney disease: Secondary | ICD-10-CM | POA: Diagnosis not present

## 2018-11-17 DIAGNOSIS — I129 Hypertensive chronic kidney disease with stage 1 through stage 4 chronic kidney disease, or unspecified chronic kidney disease: Secondary | ICD-10-CM | POA: Diagnosis not present

## 2018-11-17 DIAGNOSIS — N184 Chronic kidney disease, stage 4 (severe): Secondary | ICD-10-CM | POA: Diagnosis not present

## 2018-11-17 NOTE — Progress Notes (Signed)
[address] [place], [place] [address]   CLINIC:  Medical Oncology/Hematology  PCP:  Monico Blitz, [place] [address] [phone]   REASON FOR VISIT:  Follow-up for MGUS   CURRENT THERAPY: Clinical surveillance    INTERVAL HISTORY:  Ms. Redcay 68 y.o. female presents today for follow-up.  She reports overall doing well.  She denies any significant fatigue.  She denies any changes in bowel habits.  No change in appetite.  Denies any fevers, chills, night sweats, no weight loss.  She is here for repeat labs and office visit.   REVIEW OF SYSTEMS:  Review of Systems  Constitutional: Positive for fatigue.  HENT:  Negative.   Eyes: Negative.   Respiratory: Negative.   Cardiovascular: Negative.   Gastrointestinal: Negative.   Endocrine: Negative.   Genitourinary: Negative.    Musculoskeletal: Positive for arthralgias and myalgias.  Skin: Negative.   Neurological: Negative.   Hematological: Negative.   Psychiatric/Behavioral: Negative.      PAST MEDICAL/SURGICAL HISTORY:  Past Medical History:  Diagnosis Date  . Bipolar disorder ([place])   . Coronary atherosclerosis of native coronary artery    BMS circ 2006, 70% PDA 2009  . Essential hypertension   . Mixed hyperlipidemia   . OSA on CPAP   . Renal tubular acidosis, type IV   . SVT (supraventricular tachycardia) ([place])   . Type 2 diabetes mellitus ([place])    Past Surgical History:  Procedure Laterality Date  . Acromioclavicular arthritis    . CARDIAC CATHETERIZATION N/A 03/29/2015   Procedure: Left Heart Cath and Coronary Angiography;  Surgeon: Troy Sine, MD;  Location: [facility];  Service: Cardiovascular;  Laterality: N/A;  . RIght shoulder adhesive capsulitis    . Rotator cuff impingement syndrome    . Rotator cuff tear       SOCIAL HISTORY:  Social History   Socioeconomic History  . Marital status: Divorced    Spouse name: Not on file  . Number of  children: Not on file  . Years of education: Not on file  . Highest education level: Not on file  Occupational History  . Occupation: PLANNING AND INSPECTOR    Employer: [employer]  Social Needs  . Financial resource strain: Not on file  . Food insecurity    Worry: Not on file    Inability: Not on file  . Transportation needs    Medical: Not on file    Non-medical: Not on file  Tobacco Use  . Smoking status: Former Smoker    Packs/day: 1.00    Years: 20.00    Pack years: 20.00    Types: Cigarettes    Start date: 10/30/1984    Quit date: 07/08/2013    Years since quitting: 5.3  . Smokeless tobacco: Never Used  Substance and Sexual Activity  . Alcohol use: No    Alcohol/week: 0.0 standard drinks  . Drug use: No  . Sexual activity: Not on file  Lifestyle  . Physical activity    Days per week: Not on file    Minutes per session: Not on file  . Stress: Not on file  Relationships  . Social Herbalist on phone: Not on file    Gets together: Not on file    Attends religious service: Not on file    Active member of club or organization: Not on file    Attends meetings of clubs or  organizations: Not on file    Relationship status: Not on file  . Intimate partner violence    Fear of current or ex partner: Not on file    Emotionally abused: Not on file    Physically abused: Not on file    Forced sexual activity: Not on file  Other Topics Concern  . Not on file  Social History Narrative   She has lived with some of her daughters. Works part-time for the city.     FAMILY HISTORY:  Family History  Problem Relation Age of Onset  . Bipolar disorder Brother   . Diabetes Father     CURRENT MEDICATIONS:  Outpatient Encounter Medications as of 11/17/2018  Medication Sig  . allopurinol (ZYLOPRIM) 300 MG tablet Take 150 mg by mouth daily.  Marland Kitchen amLODipine (NORVASC) 10 MG tablet Take 10 mg by mouth daily.  Marland Kitchen aspirin 81 MG EC tablet Take 81 mg by mouth daily.    Marland Kitchen  buPROPion (WELLBUTRIN XL) 300 MG 24 hr tablet Take 300 mg by mouth daily.  . calcium carbonate (OS-CAL) 600 MG TABS Take 600 mg by mouth daily.    . Cholecalciferol (VITAMIN D3) 25 MCG (1000 UT) CAPS Take 1 capsule by mouth daily.  . divalproex (DEPAKOTE) 250 MG EC tablet Take 250 mg by mouth daily.    . hydrALAZINE (APRESOLINE) 25 MG tablet Take 1 tablet (25 mg total) by mouth 2 (two) times daily.  . insulin aspart (NOVOLOG) 100 UNIT/ML injection Inject into the skin. 10 units before bedtime meal  . isosorbide mononitrate (IMDUR) 60 MG 24 hr tablet Take 1.5 tablets (90 mg total) by mouth daily.  Marland Kitchen labetalol (NORMODYNE) 100 MG tablet Take 100 mg by mouth 2 (two) times daily.  Marland Kitchen labetalol (NORMODYNE) 300 MG tablet TAKE 1 TABLET BY MOUTH TWICE DAILY  . Liraglutide (VICTOZA) 18 MG/3ML SOLN Inject 1.2 mg into the skin daily.   . Multiple Vitamin (MULTIVITAMIN) tablet Take 1 tablet by mouth daily.    . rosuvastatin (CRESTOR) 10 MG tablet Take 10 mg by mouth daily.  . sertraline (ZOLOFT) 100 MG tablet Take 100 mg by mouth daily.    Marland Kitchen torsemide (DEMADEX) 20 MG tablet TAKE THREE TABLETS BY MOUTH EVERY MORNING  . traZODone (DESYREL) 100 MG tablet TAKE ONE TAB BY MOUTH AT BEDTIME  . TRESIBA FLEXTOUCH 200 UNIT/ML SOPN Inject 62 Units into the skin every morning.   Marland Kitchen BESIVANCE 0.6 % SUSP INSTILL ONE DROP IN THE LEFT EYE FOUR TIMES DAILY FOR 2 DAYS AFTER EACH MONTHLY EYE INJECTION  . meclizine (ANTIVERT) 25 MG tablet Take 25 mg by mouth as needed.   . nitroGLYCERIN (NITROLINGUAL) 0.4 MG/SPRAY spray Place 1 spray under the tongue every 5 (five) minutes x 3 doses as needed. (Patient not taking: Reported on 11/17/2018)  . [DISCONTINUED] insulin degludec (TRESIBA) 100 UNIT/ML SOPN FlexTouch Pen Inject 52 Units into the skin daily.    No facility-administered encounter medications on file as of 11/17/2018.     ALLERGIES:  Allergies  Allergen Reactions  . Ace Inhibitors   . Lisinopril     Severe cough  .  Penicillins   . Tetracycline   . Lamictal [Lamotrigine] Rash    Rash start inside to outter body.     PHYSICAL EXAM:  ECOG Performance status: 1  Vitals:   11/17/18 1337  BP: (!) 125/40  Pulse: (!) 56  Resp: 20  Temp: 97.8 F (36.6 C)  SpO2: 100%   Filed Weights  11/17/18 1337  Weight: 235 lb 14.4 oz (107 kg)    Physical Exam Constitutional:      Appearance: Normal appearance. She is obese.  HENT:     Head: Normocephalic.     Right Ear: External ear normal.     Left Ear: External ear normal.     Nose: Nose normal.     Mouth/Throat:     Pharynx: Oropharynx is clear.  Eyes:     Conjunctiva/sclera: Conjunctivae normal.  Neck:     Musculoskeletal: Normal range of motion.  Cardiovascular:     Rate and Rhythm: Normal rate and regular rhythm.     Pulses: Normal pulses.     Heart sounds: Normal heart sounds.  Pulmonary:     Effort: Pulmonary effort is normal.     Breath sounds: Normal breath sounds.  Abdominal:     General: Bowel sounds are normal.  Musculoskeletal:     Comments: Decreased ROM   Skin:    General: Skin is warm.  Neurological:     General: No focal deficit present.     Mental Status: She is alert. Mental status is at baseline.     Cranial Nerves: No cranial nerve deficit.  Psychiatric:        Mood and Affect: Mood normal.        Behavior: Behavior normal.        Thought Content: Thought content normal.        Judgment: Judgment normal.      LABORATORY DATA:  I have reviewed the labs as listed.  CBC    Component Value Date/Time   WBC 6.2 11/08/2018 1405   RBC 3.11 (L) 11/08/2018 1405   HGB 9.8 (L) 11/08/2018 1405   HCT 31.3 (L) 11/08/2018 1405   PLT 152 11/08/2018 1405   MCV 100.6 (H) 11/08/2018 1405   MCH 31.5 11/08/2018 1405   MCHC 31.3 11/08/2018 1405   RDW 13.4 11/08/2018 1405   LYMPHSABS 1.4 11/08/2018 1405   MONOABS 0.5 11/08/2018 1405   EOSABS 0.5 11/08/2018 1405   BASOSABS 0.1 11/08/2018 1405   CMP Latest Ref Rng &  Units 11/08/2018 07/12/2018 07/05/2018  Glucose 70 - 99 mg/dL 164(H) - 168(H)  BUN 8 - 23 mg/dL 45(H) - 43(H)  Creatinine 0.44 - 1.00 mg/dL 1.90(H) - 1.72(H)  Sodium 135 - 145 mmol/L 136 - 140  Potassium 3.5 - 5.1 mmol/L 5.0 4.8 5.5(H)  Chloride 98 - 111 mmol/L 104 - 106  CO2 22 - 32 mmol/L 24 - 26  Calcium 8.9 - 10.3 mg/dL 8.3(L) - 8.7(L)  Total Protein 6.5 - 8.1 g/dL 6.5 - 6.8  Total Bilirubin 0.3 - 1.2 mg/dL 0.3 - 0.4  Alkaline Phos 38 - 126 U/L 50 - 62  AST 15 - 41 U/L 19 - 16  ALT 0 - 44 U/L 19 - 25        ASSESSMENT & PLAN:   MGUS (monoclonal gammopathy of unknown significance) 1.  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  She was noted to have an elevated creatinine and was seen by nephrologist in December 2014.  She had an SPEP done as part of her work-up for kidney disease and was noted to have a M spike of 0.6 with normal lambda light chain ratio.  Immunofixation showed IgG kappa monoclonal protein.  Patient had hep C antibody which was negative, ANA negative and C ANCA negative, P-ANCA negative, anti-double-stranded  DNA antibody negative and complements were within normal limits.  Her creatinine at that time was 1.32 and hemoglobin was 11.8.  Also she had no significant protein in her urine. -She also had a bone scan which was noted to be negative.  She never had a bone marrow biopsy done at this time. - Patient was being followed by Dr. Tressie Stalker at our clinic since early 2017. - Labs on 06/01/2018 shows potassium 5.1, creatinine 1.67, calcium 8.6, LFTs were normal, LDH 213, SPEP stable with M spike 0.6 g/dL.  WBC 7.8, hemoglobin 9.9, platelets 156.  Light chain ratio 1.82. --Skeletal survey done on 07/05/2018 showed no lytic lesions. -Multiple myeloma labs completed on November 08, 2018 reveal a M spike of 0.5 with a spike in the gamma region concerning for monoclonal protein.  Kappa light  chains elevated at 84.1, lambda free light chains elevated at 51.9 ratio 1.62. -I trended the patient's labs out since 2017, patient's anemia and renal function has progressively gotten worse.  This is concerning for disease progression to active multiple myeloma.  Bone marrow biopsy was never done.  Per NCCN guidelines, I have recommended patient proceed with a bone marrow biopsy and aspiration to determine amount of plasma cells in the bone marrow.  I also discussed possible treatment of myeloma with patient due to the patient's renal insufficiency she would be a candidate for CyBorD or RVD with Revlimid dose reduced. -Patient will be scheduled for bone marrow biopsy and return to clinic in 4 weeks.         Orders placed this encounter:  Orders Placed This Encounter  Procedures  . CT Biopsy  . CT BONE MARROW BIOPSY & [place] [phone]

## 2018-11-17 NOTE — Assessment & Plan Note (Addendum)
1.  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  She was noted to have an elevated creatinine and was seen by nephrologist in December 2014.  She had an SPEP done as part of her work-up for kidney disease and was noted to have a M spike of 0.6 with normal lambda light chain ratio.  Immunofixation showed IgG kappa monoclonal protein.  Patient had hep C antibody which was negative, ANA negative and C ANCA negative, P-ANCA negative, anti-double-stranded DNA antibody negative and complements were within normal limits.  Her creatinine at that time was 1.32 and hemoglobin was 11.8.  Also she had no significant protein in her urine. -She also had a bone scan which was noted to be negative.  She never had a bone marrow biopsy done at this time. - Patient was being followed by Dr. Tressie Stalker at our clinic since early 2017. - Labs on 06/01/2018 shows potassium 5.1, creatinine 1.67, calcium 8.6, LFTs were normal, LDH 213, SPEP stable with M spike 0.6 g/dL.  WBC 7.8, hemoglobin 9.9, platelets 156.  Light chain ratio 1.82. --Skeletal survey done on 07/05/2018 showed no lytic lesions. -Multiple myeloma labs completed on November 08, 2018 reveal a M spike of 0.5 with a spike in the gamma region concerning for monoclonal protein.  Kappa light chains elevated at 84.1, lambda free light chains elevated at 51.9 ratio 1.62. -I trended the patient's labs out since 2017, patient's anemia and renal function has progressively gotten worse.  This is concerning for disease progression to active multiple myeloma.  Bone marrow biopsy was never done.  Per NCCN guidelines, I have recommended patient proceed with a bone marrow biopsy and aspiration to determine amount of plasma cells in the bone marrow.  I also discussed possible treatment of myeloma with patient due to the patient's renal insufficiency she would be a candidate for CyBorD or RVD with Revlimid  dose reduced. -Patient will be scheduled for bone marrow biopsy and return to clinic in 4 weeks.

## 2018-11-21 ENCOUNTER — Other Ambulatory Visit: Payer: Self-pay | Admitting: Cardiology

## 2018-11-24 ENCOUNTER — Other Ambulatory Visit: Payer: Self-pay | Admitting: Student

## 2018-11-25 ENCOUNTER — Ambulatory Visit (HOSPITAL_COMMUNITY)
Admission: RE | Admit: 2018-11-25 | Discharge: 2018-11-25 | Disposition: A | Discharge status: Home / Self Care | Payer: Medicare Other | Source: Ambulatory Visit | Attending: Hematology | Admitting: Hematology

## 2018-11-25 ENCOUNTER — Ambulatory Visit (HOSPITAL_COMMUNITY)
Admission: RE | Admit: 2018-11-25 | Discharge: 2018-11-25 | Disposition: A | Discharge status: Home / Self Care | Payer: Medicare Other | Source: Ambulatory Visit | Attending: Internal Medicine | Admitting: Internal Medicine

## 2018-11-25 ENCOUNTER — Encounter (HOSPITAL_COMMUNITY): Payer: Self-pay

## 2018-11-25 ENCOUNTER — Other Ambulatory Visit: Payer: Self-pay

## 2018-11-25 DIAGNOSIS — E119 Type 2 diabetes mellitus without complications: Secondary | ICD-10-CM | POA: Diagnosis not present

## 2018-11-25 DIAGNOSIS — E782 Mixed hyperlipidemia: Secondary | ICD-10-CM | POA: Insufficient documentation

## 2018-11-25 DIAGNOSIS — Z7982 Long term (current) use of aspirin: Secondary | ICD-10-CM | POA: Insufficient documentation

## 2018-11-25 DIAGNOSIS — F319 Bipolar disorder, unspecified: Secondary | ICD-10-CM | POA: Diagnosis not present

## 2018-11-25 DIAGNOSIS — Z833 Family history of diabetes mellitus: Secondary | ICD-10-CM | POA: Diagnosis not present

## 2018-11-25 DIAGNOSIS — Z818 Family history of other mental and behavioral disorders: Secondary | ICD-10-CM | POA: Diagnosis not present

## 2018-11-25 DIAGNOSIS — I471 Supraventricular tachycardia: Secondary | ICD-10-CM | POA: Diagnosis not present

## 2018-11-25 DIAGNOSIS — I2510 Atherosclerotic heart disease of native coronary artery without angina pectoris: Secondary | ICD-10-CM | POA: Insufficient documentation

## 2018-11-25 DIAGNOSIS — Z880 Allergy status to penicillin: Secondary | ICD-10-CM | POA: Diagnosis not present

## 2018-11-25 DIAGNOSIS — Z79899 Other long term (current) drug therapy: Secondary | ICD-10-CM | POA: Diagnosis not present

## 2018-11-25 DIAGNOSIS — D472 Monoclonal gammopathy: Secondary | ICD-10-CM | POA: Diagnosis not present

## 2018-11-25 DIAGNOSIS — Z881 Allergy status to other antibiotic agents status: Secondary | ICD-10-CM | POA: Insufficient documentation

## 2018-11-25 DIAGNOSIS — Z87891 Personal history of nicotine dependence: Secondary | ICD-10-CM | POA: Insufficient documentation

## 2018-11-25 DIAGNOSIS — G4733 Obstructive sleep apnea (adult) (pediatric): Secondary | ICD-10-CM | POA: Insufficient documentation

## 2018-11-25 DIAGNOSIS — I10 Essential (primary) hypertension: Secondary | ICD-10-CM | POA: Diagnosis not present

## 2018-11-25 DIAGNOSIS — D539 Nutritional anemia, unspecified: Secondary | ICD-10-CM | POA: Diagnosis not present

## 2018-11-25 DIAGNOSIS — Z794 Long term (current) use of insulin: Secondary | ICD-10-CM | POA: Insufficient documentation

## 2018-11-25 DIAGNOSIS — Z888 Allergy status to other drugs, medicaments and biological substances status: Secondary | ICD-10-CM | POA: Insufficient documentation

## 2018-11-25 LAB — PROTIME-INR
INR: 1.1 (ref 0.8–1.2)
Prothrombin Time: 13.8 seconds (ref 11.4–15.2)

## 2018-11-25 LAB — CBC WITH DIFFERENTIAL/PLATELET
Abs Immature Granulocytes: 0.01 10*3/uL (ref 0.00–0.07)
Basophils Absolute: 0.1 10*3/uL (ref 0.0–0.1)
Basophils Relative: 1 %
Eosinophils Absolute: 0.7 10*3/uL — ABNORMAL HIGH (ref 0.0–0.5)
Eosinophils Relative: 9 %
HCT: 32.9 % — ABNORMAL LOW (ref 36.0–46.0)
Hemoglobin: 10.1 g/dL — ABNORMAL LOW (ref 12.0–15.0)
Immature Granulocytes: 0 %
Lymphocytes Relative: 21 %
Lymphs Abs: 1.6 10*3/uL (ref 0.7–4.0)
MCH: 31 pg (ref 26.0–34.0)
MCHC: 30.7 g/dL (ref 30.0–36.0)
MCV: 100.9 fL — ABNORMAL HIGH (ref 80.0–100.0)
Monocytes Absolute: 0.6 10*3/uL (ref 0.1–1.0)
Monocytes Relative: 8 %
Neutro Abs: 4.6 10*3/uL (ref 1.7–7.7)
Neutrophils Relative %: 61 %
Platelets: 152 10*3/uL (ref 150–400)
RBC: 3.26 MIL/uL — ABNORMAL LOW (ref 3.87–5.11)
RDW: 13.2 % (ref 11.5–15.5)
WBC: 7.5 10*3/uL (ref 4.0–10.5)
nRBC: 0 % (ref 0.0–0.2)

## 2018-11-25 LAB — GLUCOSE, CAPILLARY: Glucose-Capillary: 98 mg/dL (ref 70–99)

## 2018-11-25 MED ORDER — SODIUM CHLORIDE 0.9 % IV SOLN
INTRAVENOUS | Status: DC
  Administered 2018-11-25: 08:00:00 via INTRAVENOUS

## 2018-11-25 MED ORDER — LIDOCAINE HCL (PF) 1 % IJ SOLN
INTRAMUSCULAR | Status: AC | PRN
  Administered 2018-11-25: 10 mL

## 2018-11-25 MED ORDER — FENTANYL CITRATE (PF) 100 MCG/2ML IJ SOLN
INTRAMUSCULAR | Status: AC | PRN
  Administered 2018-11-25 (×2): 50 ug via INTRAVENOUS

## 2018-11-25 MED ORDER — FENTANYL CITRATE (PF) 100 MCG/2ML IJ SOLN
INTRAMUSCULAR | Status: AC
  Filled 2018-11-25: qty 2

## 2018-11-25 MED ORDER — MIDAZOLAM HCL 2 MG/2ML IJ SOLN
INTRAMUSCULAR | Status: AC | PRN
  Administered 2018-11-25 (×2): 0.5 mg via INTRAVENOUS
  Administered 2018-11-25: 1 mg via INTRAVENOUS

## 2018-11-25 MED ORDER — MIDAZOLAM HCL 2 MG/2ML IJ SOLN
INTRAMUSCULAR | Status: AC
  Filled 2018-11-25: qty 4

## 2018-11-25 NOTE — Consult Note (Signed)
Chief Complaint: Patient was seen in consultation today for CT guided  bone marrow biopsy   Referring Physician(s): Roger Shelter  Supervising Physician: Markus Daft  Patient Status: [facility] - Out-pt  History of Present Illness: Christy [name] is a 68 y.o. female with history of  MGUS who presents to [facility]  for CT guided bone marrow biopsy for further evaluation of possible MM.   Past Medical History:  Diagnosis Date  . Bipolar disorder ([place])   . Coronary atherosclerosis of native coronary artery    BMS circ 2006, 70% PDA 2009  . Essential hypertension   . Mixed hyperlipidemia   . OSA on CPAP   . Renal tubular acidosis, type IV   . SVT (supraventricular tachycardia) ([place])   . Type 2 diabetes mellitus ([place])     Past Surgical History:  Procedure Laterality Date  . Acromioclavicular arthritis    . CARDIAC CATHETERIZATION N/A 03/29/2015   Procedure: Left Heart Cath and Coronary Angiography;  Surgeon: Troy Sine, MD;  Location: [facility];  Service: Cardiovascular;  Laterality: N/A;  . RIght shoulder adhesive capsulitis    . Rotator cuff impingement syndrome    . Rotator cuff tear      Allergies: Ace inhibitors, Lisinopril, Penicillins, Tetracycline, and Lamictal [lamotrigine]  Medications: Prior to Admission medications   Medication Sig Start Date End Date Taking? Authorizing Provider  allopurinol (ZYLOPRIM) 300 MG tablet Take 150 mg by mouth daily.   Yes [provider]  amLODipine (NORVASC) 10 MG tablet Take 10 mg by mouth daily.   Yes [provider]  aspirin 81 MG EC tablet Take 81 mg by mouth daily.     Yes [provider]  buPROPion (WELLBUTRIN XL) 300 MG 24 hr tablet Take 300 mg by mouth daily. 11/18/09  Yes [provider]  calcium carbonate (OS-CAL) 600 MG TABS Take 600 mg by mouth daily.     Yes [provider]  Cholecalciferol (VITAMIN D3) 25 MCG (1000 UT) CAPS Take 1 capsule by mouth daily.   Yes [provider]  divalproex (DEPAKOTE) 250 MG EC tablet Take 250 mg by mouth daily.     Yes [provider]  hydrALAZINE (APRESOLINE) 25 MG tablet Take 1 tablet (25 mg total) by mouth 2 (two) times daily. 12/31/17 04/01/19 Yes Satira Sark, MD  insulin aspart (NOVOLOG) 100 UNIT/ML injection Inject into the skin. 10 units before bedtime meal 11/18/09  Yes [provider]  isosorbide mononitrate (IMDUR) 60 MG 24 hr tablet Take 1.5 tablets (90 mg total) by mouth daily. 03/07/15  Yes Satira Sark, MD  labetalol (NORMODYNE) 100 MG tablet Take 100 mg by mouth 2 (two) times daily. 06/30/18  Yes [provider]  labetalol (NORMODYNE) 300 MG tablet TAKE 1 TABLET BY MOUTH TWICE DAILY 09/19/18  Yes Satira Sark, MD  Liraglutide (VICTOZA) 18 MG/3ML SOLN Inject 1.2 mg into the skin daily.    Yes [provider]  meclizine (ANTIVERT) 25 MG tablet Take 25 mg by mouth as needed.  06/30/18  Yes [provider]  Multiple Vitamin (MULTIVITAMIN) tablet Take 1 tablet by mouth daily.     Yes [provider]  rosuvastatin (CRESTOR) 10 MG tablet Take 10 mg by mouth daily.   Yes [provider]  sertraline (ZOLOFT) 100 MG tablet Take 100 mg by mouth daily.     Yes [provider]  torsemide (DEMADEX) 20 MG tablet TAKE THREE TABLETS BY  MOUTH EVERY MORNING 11/21/18  Yes Satira Sark, MD  traZODone (DESYREL) 100 MG tablet TAKE ONE TAB BY MOUTH AT BEDTIME 03/10/17  Yes [provider]  TRESIBA FLEXTOUCH 200 UNIT/ML SOPN Inject 62 Units into the skin every morning.  04/21/16  Yes [provider]  BESIVANCE 0.6 % SUSP INSTILL ONE DROP IN THE LEFT EYE FOUR TIMES DAILY FOR 2 DAYS AFTER EACH MONTHLY EYE INJECTION 11/03/17   [provider]  nitroGLYCERIN (NITROLINGUAL) 0.4 MG/SPRAY spray Place 1 spray under the tongue every 5 (five) minutes x 3 doses as needed. Patient not taking: Reported on 11/17/2018 01/09/14    Satira Sark, MD     Family History  Problem Relation Age of Onset  . Bipolar disorder Brother   . Diabetes Father     Social History   Socioeconomic History  . Marital status: Divorced    Spouse name: Not on file  . Number of children: Not on file  . Years of education: Not on file  . Highest education level: Not on file  Occupational History  . Occupation: PLANNING AND INSPECTOR    Employer: [employer]  Social Needs  . Financial resource strain: Not on file  . Food insecurity    Worry: Not on file    Inability: Not on file  . Transportation needs    Medical: Not on file    Non-medical: Not on file  Tobacco Use  . Smoking status: Former Smoker    Packs/day: 1.00    Years: 20.00    Pack years: 20.00    Types: Cigarettes    Start date: 10/30/1984    Quit date: 07/08/2013    Years since quitting: 5.3  . Smokeless tobacco: Never Used  Substance and Sexual Activity  . Alcohol use: No    Alcohol/week: 0.0 standard drinks  . Drug use: No  . Sexual activity: Not on file  Lifestyle  . Physical activity    Days per week: Not on file    Minutes per session: Not on file  . Stress: Not on file  Relationships  . Social Herbalist on phone: Not on file    Gets together: Not on file    Attends religious service: Not on file    Active member of club or organization: Not on file    Attends meetings of clubs or organizations: Not on file    Relationship status: Not on file  Other Topics Concern  . Not on file  Social History Narrative   She has lived with some of her daughters. Works part-time for the city.      Review of systems: Patient denies fever, h/e chest pain, no cough belly pain no n/v or bleeding. Patient endorses SHOB, back pain and bilateral lower extremity edema   Vital Signs: BP (!) 140/59 (BP Location: Left Arm)   Pulse 61   Temp 97.8 F (36.6 C) (Oral)   Resp 18   SpO2 94%   Physical Exam   Diminished breath sounds  bilateral., bilateral lower extremity lower edema +1, heart sounds regular  rate and rhythm. Normal bowel sounds, belly soft non tender  Imaging: No results found.  Labs:  CBC: Recent Labs    06/01/18 1522 07/05/18 1330 11/08/18 1405 11/25/18 0719  WBC 7.8 7.3 6.2 7.5  HGB 9.9* 10.1* 9.8* 10.1*  HCT 31.9* 32.5* 31.3* 32.9*  PLT 156 185 152 152    COAGS: No results for input(s):  INR, APTT in the last 8760 hours.  BMP: Recent Labs    06/01/18 1522 07/05/18 1330 07/12/18 1357 11/08/18 1405  NA 140 140  --  136  K 5.1 5.5* 4.8 5.0  CL 104 106  --  104  CO2 25 26  --  24  GLUCOSE 149* 168*  --  164*  BUN 34* 43*  --  45*  CALCIUM 8.6* 8.7*  --  8.3*  CREATININE 1.67* 1.72*  --  1.90*  GFRNONAA 31* 30*  --  26*  GFRAA 36* 35*  --  31*    LIVER FUNCTION TESTS: Recent Labs    06/01/18 1522 07/05/18 1330 11/08/18 1405  BILITOT 0.3 0.4 0.3  AST _0 ALT _1 ALKPHOS 60 62 50  PROT 6.6 6.8 6.5  ALBUMIN 3.6 3.7 3.6    TUMOR MARKERS: No results for input(s): AFPTM, CEA, CA199, CHROMGRNA in the last 8760 hours.  Assessment and Plan:  69 y.o, female History of  MGUS presented to this facility for CT guided bone marrow biopsy for further evaluation of possible MM.Risks and benefits of procedure was discussed with the patient including, but not limited to bleeding, infection, damage to adjacent structures or low yield requiring additional tests.  All of the questions were answered and there is agreement to proceed.  Consent signed and in chart.    Thank you for this interesting consult.  I greatly enjoyed meeting Christy [name] and look forward to participating in their care.  A copy of this report was sent to the requesting provider on this date.  Electronically Signed: D. Rowe Robert, PA-C - Rushie Nyhan NP 11/25/2018, 8:39 AM   I spent a total of 20 Minutes in face to face in clinical consultation, greater than 50% of which was  counseling/coordinating care for CT guided bone marrow biopsy

## 2018-11-25 NOTE — Procedures (Signed)
Interventional Radiology Procedure:   Indications: MGUS  Procedure: CT guided bone marrow biopsy  Findings: 2 aspirates and 1 core from right ilium  Complications: None     EBL: Minimal, less than 10 ml  Plan: Discharge to home in one hour.   Kanya Potteiger R. Anselm Pancoast, MD  Pager: [phone]

## 2018-11-25 NOTE — Discharge Instructions (Signed)
Please contact IR clinic [phone] or after hours and ask for IR radiologist on call [phone] with any questions or concerns about your biopsy site.  You may remove your dressing and shower tomorrow.  Moderate Conscious Sedation, Adult, Care After These instructions provide you with information about caring for yourself after your procedure. Your health care provider may also give you more specific instructions. Your treatment has been planned according to current medical practices, but problems sometimes occur. Call your health care provider if you have any problems or questions after your procedure. What can I expect after the procedure? After your procedure, it is common:  To feel sleepy for several hours.  To feel clumsy and have poor balance for several hours.  To have poor judgment for several hours.  To vomit if you eat too soon. Follow these instructions at home: For at least 24 hours after the procedure:   Do not: ? Participate in activities where you could fall or become injured. ? Drive. ? Use heavy machinery. ? Drink alcohol. ? Take sleeping pills or medicines that cause drowsiness. ? Make important decisions or sign legal documents. ? Take care of children on your own.  Rest. Eating and drinking  Follow the diet recommended by your health care provider.  If you vomit: ? Drink water, juice, or soup when you can drink without vomiting. ? Make sure you have little or no nausea before eating solid foods. General instructions  Have a responsible adult stay with you until you are awake and alert.  Take over-the-counter and prescription medicines only as told by your health care provider.  If you smoke, do not smoke without supervision.  Keep all follow-up visits as told by your health care provider. This is important. Contact a health care provider if:  You keep feeling nauseous or you keep vomiting.  You feel light-headed.  You develop a rash.  You  have a fever. Get help right away if:  You have trouble breathing. This information is not intended to replace advice given to you by your health care provider. Make sure you discuss any questions you have with your health care provider. Document Released: 10/26/2012 Document Revised: 12/18/2016 Document Reviewed: 04/27/2015 Elsevier Patient Education  2020 [employer].    Bone Marrow Aspiration and Bone Marrow Biopsy, Adult, Care After This sheet gives you information about how to care for yourself after your procedure. Your health care provider may also give you more specific instructions. If you have problems or questions, contact your health care provider. What can I expect after the procedure? After the procedure, it is common to have:  Mild pain and tenderness.  Swelling.  Bruising. Follow these instructions at home: Puncture site care      Follow instructions from your health care provider about how to take care of the puncture site. Make sure you: ? Wash your hands with soap and water before you change your bandage (dressing). If soap and water are not available, use hand sanitizer. ? Change your dressing as told by your health care provider.  You may remove your dressing tomorrow.  Check your puncture siteevery day for signs of infection. Check for: ? More redness, swelling, or pain. ? More fluid or blood. ? Warmth. ? Pus or a bad smell. General instructions  Take over-the-counter and prescription medicines only as told by your health care provider.  Do not take baths, swim, or use a hot tub until your health care provider approves. Ask if you  can take a shower or have a sponge bath.  You may shower tomorrow.  Return to your normal activities as told by your health care provider. Ask your health care provider what activities are safe for you.  Do not drive for 24 hours if you were given a medicine to help you relax (sedative) during your procedure.  Keep all  follow-up visits as told by your health care provider. This is important. Contact a health care provider if:  Your pain is not controlled with medicine. Get help right away if:  You have a fever.  You have more redness, swelling, or pain around the puncture site.  You have more fluid or blood coming from the puncture site.  Your puncture site feels warm to the touch.  You have pus or a bad smell coming from the puncture site. These symptoms may represent a serious problem that is an emergency. Do not wait to see if the symptoms will go away. Get medical help right away. Call your local emergency services (911 in the [place] Do not drive yourself to the hospital. Summary  After the procedure, it is common to have mild pain, tenderness, swelling, and bruising.  Follow instructions from your health care provider about how to take care of the puncture site.  Get help right away if you have any symptoms of infection or if you have more blood or fluid coming from the puncture site. This information is not intended to replace advice given to you by your health care provider. Make sure you discuss any questions you have with your health care provider. Document Released: 07/25/2004 Document Revised: 04/20/2017 Document Reviewed: 06/19/2015 Elsevier Patient Education  2020 [employer].

## 2018-11-28 LAB — SURGICAL PATHOLOGY

## 2018-11-30 DIAGNOSIS — F411 Generalized anxiety disorder: Secondary | ICD-10-CM | POA: Diagnosis not present

## 2018-11-30 DIAGNOSIS — F3112 Bipolar disorder, current episode manic without psychotic features, moderate: Secondary | ICD-10-CM | POA: Diagnosis not present

## 2018-11-30 DIAGNOSIS — F4310 Post-traumatic stress disorder, unspecified: Secondary | ICD-10-CM | POA: Diagnosis not present

## 2018-12-01 LAB — SURGICAL PATHOLOGY

## 2018-12-05 ENCOUNTER — Encounter (HOSPITAL_COMMUNITY): Payer: Self-pay | Admitting: Hematology

## 2018-12-12 ENCOUNTER — Inpatient Hospital Stay (HOSPITAL_COMMUNITY): Payer: Medicare Other | Attending: Hematology | Admitting: Hematology

## 2018-12-12 ENCOUNTER — Encounter (HOSPITAL_COMMUNITY): Payer: Self-pay | Admitting: Hematology

## 2018-12-12 ENCOUNTER — Other Ambulatory Visit: Payer: Self-pay

## 2018-12-12 DIAGNOSIS — N184 Chronic kidney disease, stage 4 (severe): Secondary | ICD-10-CM | POA: Insufficient documentation

## 2018-12-12 DIAGNOSIS — D649 Anemia, unspecified: Secondary | ICD-10-CM | POA: Diagnosis not present

## 2018-12-12 DIAGNOSIS — Z794 Long term (current) use of insulin: Secondary | ICD-10-CM | POA: Diagnosis not present

## 2018-12-12 DIAGNOSIS — I129 Hypertensive chronic kidney disease with stage 1 through stage 4 chronic kidney disease, or unspecified chronic kidney disease: Secondary | ICD-10-CM | POA: Insufficient documentation

## 2018-12-12 DIAGNOSIS — D472 Monoclonal gammopathy: Secondary | ICD-10-CM | POA: Insufficient documentation

## 2018-12-12 DIAGNOSIS — E1122 Type 2 diabetes mellitus with diabetic chronic kidney disease: Secondary | ICD-10-CM | POA: Diagnosis not present

## 2018-12-12 DIAGNOSIS — Z79899 Other long term (current) drug therapy: Secondary | ICD-10-CM | POA: Diagnosis not present

## 2018-12-12 NOTE — Progress Notes (Signed)
[place], [name] [address]   CLINIC:  Medical Oncology/Hematology  PCP:  Monico Blitz, [place] [address] [phone]   REASON FOR VISIT:  Follow-up for MGUS   CURRENT THERAPY: Clinical surveillance   INTERVAL HISTORY:  Ms. Lamba 68 y.o. female presents today for follow-up.  She reports overall doing well.  Her daughter accompanies her.  She denies any changes in her health history since her last visit.  Denies any obvious signs of bleeding.  No change in bowel habits.  Appetite is stable.  No weight loss.  No new bony pains.  Denies neuropathy.  No edema.  She is here to discuss most recent report of bone marrow biopsy.  REVIEW OF SYSTEMS:  Review of Systems  Musculoskeletal: Positive for arthralgias and myalgias.  All other systems reviewed and are negative.    PAST MEDICAL/SURGICAL HISTORY:  Past Medical History:  Diagnosis Date  . Bipolar disorder ([place])   . Coronary atherosclerosis of native coronary artery    BMS circ 2006, 70% PDA 2009  . Essential hypertension   . Mixed hyperlipidemia   . OSA on CPAP   . Renal tubular acidosis, type IV   . SVT (supraventricular tachycardia) ([place])   . Type 2 diabetes mellitus ([place])    Past Surgical History:  Procedure Laterality Date  . Acromioclavicular arthritis    . CARDIAC CATHETERIZATION N/A 03/29/2015   Procedure: Left Heart Cath and Coronary Angiography;  Surgeon: Troy Sine, MD;  Location: [facility];  Service: Cardiovascular;  Laterality: N/A;  . RIght shoulder adhesive capsulitis    . Rotator cuff impingement syndrome    . Rotator cuff tear       SOCIAL HISTORY:  Social History   Socioeconomic History  . Marital status: Divorced    Spouse name: Not on file  . Number of children: Not on file  . Years of education: Not on file  . Highest education level: Not on file  Occupational History  . Occupation: PLANNING AND INSPECTOR    Employer:  [employer]  Social Needs  . Financial resource strain: Not on file  . Food insecurity    Worry: Not on file    Inability: Not on file  . Transportation needs    Medical: Not on file    Non-medical: Not on file  Tobacco Use  . Smoking status: Former Smoker    Packs/day: 1.00    Years: 20.00    Pack years: 20.00    Types: Cigarettes    Start date: 10/30/1984    Quit date: 07/08/2013    Years since quitting: 5.4  . Smokeless tobacco: Never Used  Substance and Sexual Activity  . Alcohol use: No    Alcohol/week: 0.0 standard drinks  . Drug use: No  . Sexual activity: Not on file  Lifestyle  . Physical activity    Days per week: Not on file    Minutes per session: Not on file  . Stress: Not on file  Relationships  . Social Herbalist on phone: Not on file    Gets together: Not on file    Attends religious service: Not on file    Active member of club or organization: Not on file    Attends meetings of clubs or organizations: Not on file    Relationship status: Not on file  . Intimate partner violence    Fear of  current or ex partner: Not on file    Emotionally abused: Not on file    Physically abused: Not on file    Forced sexual activity: Not on file  Other Topics Concern  . Not on file  Social History Narrative   She has lived with some of her daughters. Works part-time for the city.     FAMILY HISTORY:  Family History  Problem Relation Age of Onset  . Bipolar disorder Brother   . Diabetes Father     CURRENT MEDICATIONS:  Outpatient Encounter Medications as of 12/12/2018  Medication Sig  . allopurinol (ZYLOPRIM) 300 MG tablet Take 150 mg by mouth daily.  Marland Kitchen amLODipine (NORVASC) 10 MG tablet Take 10 mg by mouth daily.  Marland Kitchen aspirin 81 MG EC tablet Take 81 mg by mouth daily.    Marland Kitchen BESIVANCE 0.6 % SUSP INSTILL ONE DROP IN THE LEFT EYE FOUR TIMES DAILY FOR 2 DAYS AFTER EACH MONTHLY EYE INJECTION  . buPROPion (WELLBUTRIN XL) 300 MG 24 hr tablet Take 300 mg  by mouth daily.  . calcium carbonate (OS-CAL) 600 MG TABS Take 600 mg by mouth daily.    . Cholecalciferol (VITAMIN D3) 25 MCG (1000 UT) CAPS Take 1 capsule by mouth daily.  . divalproex (DEPAKOTE) 250 MG EC tablet Take 250 mg by mouth daily.    . hydrALAZINE (APRESOLINE) 25 MG tablet Take 1 tablet (25 mg total) by mouth 2 (two) times daily.  . insulin aspart (NOVOLOG) 100 UNIT/ML injection Inject into the skin. 10 units before bedtime meal  . isosorbide mononitrate (IMDUR) 60 MG 24 hr tablet Take 1.5 tablets (90 mg total) by mouth daily.  Marland Kitchen labetalol (NORMODYNE) 100 MG tablet Take 100 mg by mouth 2 (two) times daily.  Marland Kitchen labetalol (NORMODYNE) 300 MG tablet TAKE 1 TABLET BY MOUTH TWICE DAILY  . Liraglutide (VICTOZA) 18 MG/3ML SOLN Inject 1.2 mg into the skin daily.   . Multiple Vitamin (MULTIVITAMIN) tablet Take 1 tablet by mouth daily.    . rosuvastatin (CRESTOR) 10 MG tablet Take 10 mg by mouth daily.  . sertraline (ZOLOFT) 100 MG tablet Take 100 mg by mouth daily.    Marland Kitchen torsemide (DEMADEX) 20 MG tablet TAKE THREE TABLETS BY MOUTH EVERY MORNING  . traZODone (DESYREL) 100 MG tablet TAKE ONE TAB BY MOUTH AT BEDTIME  . TRESIBA FLEXTOUCH 200 UNIT/ML SOPN Inject 62 Units into the skin every morning.   . meclizine (ANTIVERT) 25 MG tablet Take 25 mg by mouth as needed.   . nitroGLYCERIN (NITROLINGUAL) 0.4 MG/SPRAY spray Place 1 spray under the tongue every 5 (five) minutes x 3 doses as needed. (Patient not taking: Reported on 11/17/2018)   No facility-administered encounter medications on file as of 12/12/2018.     ALLERGIES:  Allergies  Allergen Reactions  . Ace Inhibitors   . Lisinopril     Severe cough  . Penicillins   . Tetracycline   . Lamictal [Lamotrigine] Rash    Rash start inside to outter body.     PHYSICAL EXAM:  ECOG Performance status: 1  Vitals:   12/12/18 1349  BP: (!) 128/50  Pulse: 62  Resp: (!) 22  Temp: (!) 97.3 F (36.3 C)  SpO2: 100%   Filed Weights    12/12/18 1349  Weight: 233 lb (105.7 kg)    Physical Exam Constitutional:      Appearance: Normal appearance.  HENT:     Head: Normocephalic.     Right Ear: External ear  normal.     Left Ear: External ear normal.     Nose: Nose normal.     Mouth/Throat:     Pharynx: Oropharynx is clear.  Neck:     Musculoskeletal: Normal range of motion.  Cardiovascular:     Rate and Rhythm: Normal rate and regular rhythm.     Pulses: Normal pulses.     Heart sounds: Normal heart sounds.  Pulmonary:     Effort: Pulmonary effort is normal.     Breath sounds: Normal breath sounds.  Abdominal:     General: Bowel sounds are normal.  Musculoskeletal: Normal range of motion.     Comments: Decreased range of motion  Skin:    General: Skin is warm and dry.  Neurological:     General: No focal deficit present.     Mental Status: She is alert and oriented to person, place, and time.  Psychiatric:        Mood and Affect: Mood normal.        Behavior: Behavior normal.        Thought Content: Thought content normal.        Judgment: Judgment normal.      LABORATORY DATA:  I have reviewed the labs as listed.  CBC    Component Value Date/Time   WBC 7.5 11/25/2018 0719   RBC 3.26 (L) 11/25/2018 0719   HGB 10.1 (L) 11/25/2018 0719   HCT 32.9 (L) 11/25/2018 0719   PLT 152 11/25/2018 0719   MCV 100.9 (H) 11/25/2018 0719   MCH 31.0 11/25/2018 0719   MCHC 30.7 11/25/2018 0719   RDW 13.2 11/25/2018 0719   LYMPHSABS 1.6 11/25/2018 0719   MONOABS 0.6 11/25/2018 0719   EOSABS 0.7 (H) 11/25/2018 0719   BASOSABS 0.1 11/25/2018 0719   CMP Latest Ref Rng & Units 11/08/2018 07/12/2018 07/05/2018  Glucose 70 - 99 mg/dL 164(H) - 168(H)  BUN 8 - 23 mg/dL 45(H) - 43(H)  Creatinine 0.44 - 1.00 mg/dL 1.90(H) - 1.72(H)  Sodium 135 - 145 mmol/L 136 - 140  Potassium 3.5 - 5.1 mmol/L 5.0 4.8 5.5(H)  Chloride 98 - 111 mmol/L 104 - 106  CO2 22 - 32 mmol/L 24 - 26  Calcium 8.9 - 10.3 mg/dL 8.3(L) - 8.7(L)   Total Protein 6.5 - 8.1 g/dL 6.5 - 6.8  Total Bilirubin 0.3 - 1.2 mg/dL 0.3 - 0.4  Alkaline Phos 38 - 126 U/L 50 - 62  AST 15 - 41 U/L 19 - 16  ALT 0 - 44 U/L 19 - 25         ASSESSMENT & PLAN:   MGUS (monoclonal gammopathy of unknown significance) 1.  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  She was noted to have an elevated creatinine and was seen by nephrologist in December 2014.  She had an SPEP done as part of her work-up for kidney disease and was noted to have a M spike of 0.6 with normal lambda light chain ratio.  Immunofixation showed IgG kappa monoclonal protein.  Patient had hep C antibody which was negative, ANA negative and C ANCA negative, P-ANCA negative, anti-double-stranded DNA antibody negative and complements were within normal limits.  Her creatinine at that time was 1.32 and hemoglobin was 11.8.  Also she had no significant protein in her urine. -She also had a bone scan which was noted to be negative.  She never had a bone marrow biopsy  done at this time. - Patient was being followed by Dr. Tressie Stalker at our clinic since early 2017. - Labs on 06/01/2018 shows potassium 5.1, creatinine 1.67, calcium 8.6, LFTs were normal, LDH 213, SPEP stable with M spike 0.6 g/dL.  WBC 7.8, hemoglobin 9.9, platelets 156.  Light chain ratio 1.82. --Skeletal survey done on 07/05/2018 showed no lytic lesions. -Multiple myeloma labs completed on November 08, 2018 reveal a M spike of 0.5 with a spike in the gamma region concerning for monoclonal protein.  Kappa light chains elevated at 84.1, lambda free light chains elevated at 51.9 ratio 1.62. -I trended the patient's labs out since 2017, patient's anemia and renal function has progressively gotten worse.  This is concerning for disease progression to active multiple myeloma.  Bone marrow biopsy was never done.  Per NCCN guidelines, I have recommended patient  proceed with a bone marrow biopsy and aspiration to determine amount of plasma cells in the bone marrow.  Patient also need to repeat skeletal survey and a 24-hour urine protein electrophoresis.  -I also discussed possible treatment of myeloma with patient due to the patient's renal insufficiency she would be a candidate for CyBorD or RVD with Revlimid dose reduced. -Marrow biopsy was completed on 11/25/2018.  Pathology consistent with hypercellular marrow with trilineage hematopoiesis and 2% plasma cells.  In review of biopsy report, the specimen was inadequate.  FISH was negative.  Cytogenetics were normal. -I have asked patient to seek out a second opinion regarding her case.  Also repeating her bone marrow biopsy is an option.  I do not feel that the amount of plasma cells is consistent with the amount of end organ disease. -Patient agrees to seek a second opinion at [facility] bone marrow transplant center with Dr. Cameron Sprang. -I will see the patient back in 3 to 4 weeks.             [place] [phone]

## 2018-12-12 NOTE — Assessment & Plan Note (Signed)
1.  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  She was noted to have an elevated creatinine and was seen by nephrologist in December 2014.  She had an SPEP done as part of her work-up for kidney disease and was noted to have a M spike of 0.6 with normal lambda light chain ratio.  Immunofixation showed IgG kappa monoclonal protein.  Patient had hep C antibody which was negative, ANA negative and C ANCA negative, P-ANCA negative, anti-double-stranded DNA antibody negative and complements were within normal limits.  Her creatinine at that time was 1.32 and hemoglobin was 11.8.  Also she had no significant protein in her urine. -She also had a bone scan which was noted to be negative.  She never had a bone marrow biopsy done at this time. - Patient was being followed by Dr. Tressie Stalker at our clinic since early 2017. - Labs on 06/01/2018 shows potassium 5.1, creatinine 1.67, calcium 8.6, LFTs were normal, LDH 213, SPEP stable with M spike 0.6 g/dL.  WBC 7.8, hemoglobin 9.9, platelets 156.  Light chain ratio 1.82. --Skeletal survey done on 07/05/2018 showed no lytic lesions. -Multiple myeloma labs completed on November 08, 2018 reveal a M spike of 0.5 with a spike in the gamma region concerning for monoclonal protein.  Kappa light chains elevated at 84.1, lambda free light chains elevated at 51.9 ratio 1.62. -I trended the patient's labs out since 2017, patient's anemia and renal function has progressively gotten worse.  This is concerning for disease progression to active multiple myeloma.  Bone marrow biopsy was never done.  Per NCCN guidelines, I have recommended patient proceed with a bone marrow biopsy and aspiration to determine amount of plasma cells in the bone marrow.  Patient also need to repeat skeletal survey and a 24-hour urine protein electrophoresis.  -I also discussed possible treatment of myeloma with patient due to  the patient's renal insufficiency she would be a candidate for CyBorD or RVD with Revlimid dose reduced. -Marrow biopsy was completed on 11/25/2018.  Pathology consistent with hypercellular marrow with trilineage hematopoiesis and 2% plasma cells.  In review of biopsy report, the specimen was inadequate.  FISH was negative.  Cytogenetics were normal. -I have asked patient to seek out a second opinion regarding her case.  Also repeating her bone marrow biopsy is an option.  I do not feel that the amount of plasma cells is consistent with the amount of end organ disease. -Patient agrees to seek a second opinion at [facility] bone marrow transplant center with Dr. Cameron Sprang. -I will see the patient back in 3 to 4 weeks.

## 2018-12-13 ENCOUNTER — Encounter (HOSPITAL_COMMUNITY): Payer: Self-pay | Admitting: Lab

## 2018-12-13 NOTE — Progress Notes (Unsigned)
Referral sent to [facility] Dr Samule Ohm.  Records faxed on 11/24

## 2018-12-19 ENCOUNTER — Other Ambulatory Visit: Payer: Self-pay | Admitting: Cardiology

## 2018-12-20 ENCOUNTER — Ambulatory Visit (HOSPITAL_COMMUNITY): Payer: Medicare Other | Admitting: Hematology

## 2018-12-20 DIAGNOSIS — L110 Acquired keratosis follicularis: Secondary | ICD-10-CM | POA: Diagnosis not present

## 2018-12-20 DIAGNOSIS — L609 Nail disorder, unspecified: Secondary | ICD-10-CM | POA: Diagnosis not present

## 2018-12-20 DIAGNOSIS — E1140 Type 2 diabetes mellitus with diabetic neuropathy, unspecified: Secondary | ICD-10-CM | POA: Diagnosis not present

## 2018-12-22 DIAGNOSIS — E119 Type 2 diabetes mellitus without complications: Secondary | ICD-10-CM | POA: Diagnosis not present

## 2018-12-22 DIAGNOSIS — I10 Essential (primary) hypertension: Secondary | ICD-10-CM | POA: Diagnosis not present

## 2018-12-22 DIAGNOSIS — I2510 Atherosclerotic heart disease of native coronary artery without angina pectoris: Secondary | ICD-10-CM | POA: Diagnosis not present

## 2018-12-22 DIAGNOSIS — E7800 Pure hypercholesterolemia, unspecified: Secondary | ICD-10-CM | POA: Diagnosis not present

## 2018-12-27 DIAGNOSIS — M531 Cervicobrachial syndrome: Secondary | ICD-10-CM | POA: Diagnosis not present

## 2018-12-27 DIAGNOSIS — E875 Hyperkalemia: Secondary | ICD-10-CM | POA: Diagnosis not present

## 2018-12-27 DIAGNOSIS — E559 Vitamin D deficiency, unspecified: Secondary | ICD-10-CM | POA: Diagnosis not present

## 2018-12-27 DIAGNOSIS — E1122 Type 2 diabetes mellitus with diabetic chronic kidney disease: Secondary | ICD-10-CM | POA: Diagnosis not present

## 2018-12-27 DIAGNOSIS — D631 Anemia in chronic kidney disease: Secondary | ICD-10-CM | POA: Diagnosis not present

## 2018-12-27 DIAGNOSIS — E211 Secondary hyperparathyroidism, not elsewhere classified: Secondary | ICD-10-CM | POA: Diagnosis not present

## 2018-12-27 DIAGNOSIS — D472 Monoclonal gammopathy: Secondary | ICD-10-CM | POA: Diagnosis not present

## 2018-12-27 DIAGNOSIS — N189 Chronic kidney disease, unspecified: Secondary | ICD-10-CM | POA: Diagnosis not present

## 2018-12-27 DIAGNOSIS — R809 Proteinuria, unspecified: Secondary | ICD-10-CM | POA: Diagnosis not present

## 2018-12-27 DIAGNOSIS — M9901 Segmental and somatic dysfunction of cervical region: Secondary | ICD-10-CM | POA: Diagnosis not present

## 2018-12-27 DIAGNOSIS — M9902 Segmental and somatic dysfunction of thoracic region: Secondary | ICD-10-CM | POA: Diagnosis not present

## 2018-12-27 DIAGNOSIS — E1129 Type 2 diabetes mellitus with other diabetic kidney complication: Secondary | ICD-10-CM | POA: Diagnosis not present

## 2018-12-27 DIAGNOSIS — S233XXA Sprain of ligaments of thoracic spine, initial encounter: Secondary | ICD-10-CM | POA: Diagnosis not present

## 2018-12-27 DIAGNOSIS — M9903 Segmental and somatic dysfunction of lumbar region: Secondary | ICD-10-CM | POA: Diagnosis not present

## 2018-12-27 DIAGNOSIS — S338XXA Sprain of other parts of lumbar spine and pelvis, initial encounter: Secondary | ICD-10-CM | POA: Diagnosis not present

## 2018-12-28 DIAGNOSIS — M9902 Segmental and somatic dysfunction of thoracic region: Secondary | ICD-10-CM | POA: Diagnosis not present

## 2018-12-28 DIAGNOSIS — S233XXA Sprain of ligaments of thoracic spine, initial encounter: Secondary | ICD-10-CM | POA: Diagnosis not present

## 2018-12-28 DIAGNOSIS — M9901 Segmental and somatic dysfunction of cervical region: Secondary | ICD-10-CM | POA: Diagnosis not present

## 2018-12-28 DIAGNOSIS — M531 Cervicobrachial syndrome: Secondary | ICD-10-CM | POA: Diagnosis not present

## 2018-12-28 DIAGNOSIS — S338XXA Sprain of other parts of lumbar spine and pelvis, initial encounter: Secondary | ICD-10-CM | POA: Diagnosis not present

## 2018-12-28 DIAGNOSIS — M9903 Segmental and somatic dysfunction of lumbar region: Secondary | ICD-10-CM | POA: Diagnosis not present

## 2018-12-30 ENCOUNTER — Ambulatory Visit (INDEPENDENT_AMBULATORY_CARE_PROVIDER_SITE_OTHER): Payer: Medicare Other | Admitting: Cardiology

## 2018-12-30 ENCOUNTER — Other Ambulatory Visit: Payer: Self-pay

## 2018-12-30 ENCOUNTER — Encounter: Payer: Self-pay | Admitting: Cardiology

## 2018-12-30 VITALS — BP 164/74 | HR 60 | Ht 63.0 in | Wt 233.2 lb

## 2018-12-30 DIAGNOSIS — R0602 Shortness of breath: Secondary | ICD-10-CM

## 2018-12-30 DIAGNOSIS — D631 Anemia in chronic kidney disease: Secondary | ICD-10-CM | POA: Diagnosis not present

## 2018-12-30 DIAGNOSIS — N189 Chronic kidney disease, unspecified: Secondary | ICD-10-CM | POA: Diagnosis not present

## 2018-12-30 DIAGNOSIS — E1129 Type 2 diabetes mellitus with other diabetic kidney complication: Secondary | ICD-10-CM | POA: Diagnosis not present

## 2018-12-30 DIAGNOSIS — I10 Essential (primary) hypertension: Secondary | ICD-10-CM

## 2018-12-30 DIAGNOSIS — I471 Supraventricular tachycardia, unspecified: Secondary | ICD-10-CM

## 2018-12-30 DIAGNOSIS — E559 Vitamin D deficiency, unspecified: Secondary | ICD-10-CM | POA: Diagnosis not present

## 2018-12-30 DIAGNOSIS — I5032 Chronic diastolic (congestive) heart failure: Secondary | ICD-10-CM

## 2018-12-30 DIAGNOSIS — E211 Secondary hyperparathyroidism, not elsewhere classified: Secondary | ICD-10-CM | POA: Diagnosis not present

## 2018-12-30 DIAGNOSIS — I2510 Atherosclerotic heart disease of native coronary artery without angina pectoris: Secondary | ICD-10-CM

## 2018-12-30 DIAGNOSIS — E1122 Type 2 diabetes mellitus with diabetic chronic kidney disease: Secondary | ICD-10-CM | POA: Diagnosis not present

## 2018-12-30 DIAGNOSIS — R809 Proteinuria, unspecified: Secondary | ICD-10-CM | POA: Diagnosis not present

## 2018-12-30 DIAGNOSIS — D472 Monoclonal gammopathy: Secondary | ICD-10-CM | POA: Diagnosis not present

## 2018-12-30 MED ORDER — LABETALOL HCL 100 MG PO TABS: 100.0000 mg | ORAL_TABLET | Freq: Two times a day (BID) | ORAL | 1 refills | Status: DC

## 2018-12-30 MED ORDER — HYDRALAZINE HCL 25 MG PO TABS: 25.0000 mg | ORAL_TABLET | Freq: Three times a day (TID) | ORAL | 1 refills | Status: DC

## 2018-12-30 MED ORDER — LABETALOL HCL 300 MG PO TABS: 300.0000 mg | ORAL_TABLET | Freq: Two times a day (BID) | ORAL | 1 refills | Status: DC

## 2018-12-30 NOTE — Progress Notes (Signed)
Cardiology Office Note  Date: 12/30/2018   ID: Christy [name], DOB [birth year], MRN [id number]  PCP:  Monico Blitz, MD  Cardiologist:  Rozann Lesches, MD Electrophysiologist:  None   Chief Complaint  Patient presents with  . Cardiac follow-up    History of Present Illness: Christy [name] is a 67 y.o. female last seen in March.  She presents for a routine visit.  She does not report any angina but has stable dyspnea on exertion.  She has also had some leg swelling and her Demadex dose has been increased per nephrologist.  Her weight today is stable.  I reviewed her recent lab work obtained by nephrologist, outlined below.  CKD stage IIIb is stable.  I personally reviewed her ECG today which shows sinus bradycardia with left bundle branch block.  This is new in comparison to the previous tracing which showed an IVCD that was incomplete.   Last echocardiogram was in 2017 at which point LVEF was normal at 60 to 65% with mild LVH and diastolic dysfunction.  We discussed obtaining a follow-up echocardiogram to ensure no change in LVEF.  I reviewed her medications which are outlined below.  We will plan to increase hydralazine 25 mg 3 times daily and continue present doses of labetalol, Demadex, Crestor, aspirin, Norvasc and Imdur.  Past Medical History:  Diagnosis Date  . Bipolar disorder ([place])   . Coronary atherosclerosis of native coronary artery    BMS circ 2006, 70% PDA 2009  . Essential hypertension   . Mixed hyperlipidemia   . OSA on CPAP   . Renal tubular acidosis, type IV   . SVT (supraventricular tachycardia) ([place])   . Type 2 diabetes mellitus ([place])     Past Surgical History:  Procedure Laterality Date  . Acromioclavicular arthritis    . CARDIAC CATHETERIZATION N/A 03/29/2015   Procedure: Left Heart Cath and Coronary Angiography;  Surgeon: Troy Sine, MD;  Location: [facility];  Service: Cardiovascular;  Laterality: N/A;  . RIght shoulder adhesive  capsulitis    . Rotator cuff impingement syndrome    . Rotator cuff tear      Current Outpatient Medications  Medication Sig Dispense Refill  . allopurinol (ZYLOPRIM) 300 MG tablet Take 150 mg by mouth daily.    Marland Kitchen amLODipine (NORVASC) 10 MG tablet Take 10 mg by mouth daily.    Marland Kitchen aspirin 81 MG EC tablet Take 81 mg by mouth daily.      Marland Kitchen BESIVANCE 0.6 % SUSP INSTILL ONE DROP IN THE LEFT EYE FOUR TIMES DAILY FOR 2 DAYS AFTER EACH MONTHLY EYE INJECTION  12  . buPROPion (WELLBUTRIN XL) 300 MG 24 hr tablet Take 300 mg by mouth daily.    . calcium carbonate (OS-CAL) 600 MG TABS Take 600 mg by mouth daily.      . Cholecalciferol (VITAMIN D3) 25 MCG (1000 UT) CAPS Take 1 capsule by mouth daily.    . divalproex (DEPAKOTE) 250 MG EC tablet Take 250 mg by mouth daily.      . hydrALAZINE (APRESOLINE) 25 MG tablet Take 1 tablet (25 mg total) by mouth 3 (three) times daily. 270 tablet 1  . insulin aspart (NOVOLOG) 100 UNIT/ML injection Inject into the skin. 10 units before bedtime meal    . isosorbide mononitrate (IMDUR) 60 MG 24 hr tablet Take 1.5 tablets (90 mg total) by mouth daily. 45 tablet 6  . labetalol (NORMODYNE) 100 MG tablet Take 1 tablet (100 mg  total) by mouth 2 (two) times daily. 180 tablet 1  . labetalol (NORMODYNE) 300 MG tablet Take 1 tablet (300 mg total) by mouth 2 (two) times daily. 180 tablet 1  . Liraglutide (VICTOZA) 18 MG/3ML SOLN Inject 1.2 mg into the skin daily.     . meclizine (ANTIVERT) 25 MG tablet Take 25 mg by mouth as needed.     . Multiple Vitamin (MULTIVITAMIN) tablet Take 1 tablet by mouth daily.      . nitroGLYCERIN (NITROLINGUAL) 0.4 MG/SPRAY spray Place 1 spray under the tongue every 5 (five) minutes x 3 doses as needed. 12 g 2  . rosuvastatin (CRESTOR) 10 MG tablet Take 10 mg by mouth daily.    . sertraline (ZOLOFT) 100 MG tablet Take 100 mg by mouth daily.      Marland Kitchen torsemide (DEMADEX) 20 MG tablet Take 80 mg by mouth every morning.    . traZODone (DESYREL) 100 MG  tablet TAKE ONE TAB BY MOUTH AT BEDTIME  3  . TRESIBA FLEXTOUCH 200 UNIT/ML SOPN Inject 62 Units into the skin every morning.   2   No current facility-administered medications for this visit.   Allergies:  Ace inhibitors, Lisinopril, Penicillins, Tetracycline, and Lamictal [lamotrigine]   Social History: The patient  reports that she quit smoking about 5 years ago. Her smoking use included cigarettes. She started smoking about 34 years ago. She has a 20.00 pack-year smoking history. She has never used smokeless tobacco. She reports that she does not drink alcohol or use drugs.   ROS:  Please see the history of present illness. Otherwise, complete review of systems is positive for none.  All other systems are reviewed and negative.   Physical Exam: VS:  BP (!) 164/74   Pulse 60   Ht 5\' 3"  (1.6 m)   Wt 233 lb 3.2 oz (105.8 kg)   SpO2 99%   BMI 41.31 kg/m , BMI Body mass index is 41.31 kg/m.  Wt Readings from Last 3 Encounters:  12/30/18 233 lb 3.2 oz (105.8 kg)  12/12/18 233 lb (105.7 kg)  11/17/18 235 lb 14.4 oz (107 kg)    General: Patient appears comfortable at rest. HEENT: Conjunctiva and lids normal, wearing a mask. Neck: Supple, no elevated JVP or carotid bruits. Lungs: Clear to auscultation, nonlabored breathing at rest. Cardiac: Regular rate and rhythm, no S3, 2/6 systolic murmur. Abdomen: Soft, nontender, bowel sounds present. Extremities: No pitting edema, distal pulses 2+. Skin: Warm and dry. Musculoskeletal: No kyphosis. Neuropsychiatric: Alert and oriented x3, affect grossly appropriate.  ECG:  An ECG dated 09/07/2017 was personally reviewed today and demonstrated:  Sinus rhythm with poor R wave progression.  Recent Labwork: 11/08/2018: ALT 19; AST 19; BUN 45; Creatinine, Ser 1.90; Potassium 5.0; Sodium 136 11/25/2018: Hemoglobin 10.1; Platelets 152  December 2020: BUN 42, creatinine 1.57, GFR 33, potassium 5.0, hemoglobin 11.0, platelets 135,  Other Studies  Reviewed Today:  Cardiac catheterization 03/29/2015: No significant coronary obstructive disease with evidence for widely patent stent in the proximal left circumflex artery, a normal LAD and a normal very large dominant RCA.  Systemic hypertension with elevated left ventricular end-diastolic pressure at 37 mm.  Echocardiogram 03/13/2015: Study Conclusions  - Left ventricle: The cavity size was normal. Wall thickness was increased in a pattern of mild LVH. Systolic function was normal. The estimated ejection fraction was in the range of 60% to 65%. Wall motion was normal; there were no regional wall motion abnormalities. Doppler parameters are consistent with  restrictive physiology, indicative of decreased left ventricular diastolic compliance and/or increased left atrial pressure. - Aortic valve: There was mild regurgitation. Valve area (VTI): 2.84 cm^2. Valve area (Vmax): 2.77 cm^2. Valve area (Vmean): 2.57 cm^2. - Mitral valve: Mildly calcified annulus. Mildly thickened leaflets . There was mild regurgitation. - Left atrium: The atrium was severely dilated. - Right atrium: The atrium was mildly dilated. - Pulmonary arteries: Systolic pressure was mildly increased. PA peak pressure: 37 mm Hg (S). - Technically difficult study.  Assessment and Plan:  1.  Chronic diastolic heart failure.  Demadex dose has been advanced since last assessment per nephrologist, her weight is stable but she has had some intermittent leg swelling.  ECG shows left bundle branch block pattern rather than incomplete IVCD from last year.  She does not report any angina symptoms.  We will obtain a follow-up echocardiogram to ensure stability in LVEF.  2.  Essential hypertension, continue present medications but increase hydralazine to 25 mg 3 times daily.  3.  CAD status post BMS to the circumflex in 2006.  Stent site was patent as of 2016.  She does not report any active angina and  remains on aspirin and statin therapy.  4.  CKD stage IIIb.  Keep follow-up with nephrology.  Medication Adjustments/Labs and Tests Ordered: Current medicines are reviewed at length with the patient today.  Concerns regarding medicines are outlined above.   Tests Ordered: Orders Placed This Encounter  Procedures  . EKG 12-Lead  . ECHOCARDIOGRAM COMPLETE    Medication Changes: Meds ordered this encounter  Medications  . hydrALAZINE (APRESOLINE) 25 MG tablet    Sig: Take 1 tablet (25 mg total) by mouth 3 (three) times daily.    Dispense:  270 tablet    Refill:  1    DOSE INCREASE 12/30/2018  . labetalol (NORMODYNE) 300 MG tablet    Sig: Take 1 tablet (300 mg total) by mouth 2 (two) times daily.    Dispense:  180 tablet    Refill:  1    This prescription was filled on 08/30/2018. Any refills authorized will be placed on file.  . labetalol (NORMODYNE) 100 MG tablet    Sig: Take 1 tablet (100 mg total) by mouth 2 (two) times daily.    Dispense:  180 tablet    Refill:  1    Disposition:  Follow up 6 months in the office.  Signed, Satira Sark, MD, [facility] 12/30/2018 4:46 PM    [facility] [place], [place], [place] [address] Phone: [phone]; Fax: [phone]

## 2018-12-30 NOTE — Patient Instructions (Signed)
Your physician wants you to follow-up in: [place] will receive a reminder letter in the mail two months in advance. If you don't receive a letter, please call our office to schedule the follow-up appointment.  Your physician has recommended you make the following change in your medication:   INCREASE HYDRALAZINE 25 MG 3 TIMES DAILY  Thank you for choosing [facility]!!

## 2019-01-02 DIAGNOSIS — S338XXA Sprain of other parts of lumbar spine and pelvis, initial encounter: Secondary | ICD-10-CM | POA: Diagnosis not present

## 2019-01-02 DIAGNOSIS — M531 Cervicobrachial syndrome: Secondary | ICD-10-CM | POA: Diagnosis not present

## 2019-01-02 DIAGNOSIS — M9903 Segmental and somatic dysfunction of lumbar region: Secondary | ICD-10-CM | POA: Diagnosis not present

## 2019-01-02 DIAGNOSIS — M9901 Segmental and somatic dysfunction of cervical region: Secondary | ICD-10-CM | POA: Diagnosis not present

## 2019-01-02 DIAGNOSIS — S233XXA Sprain of ligaments of thoracic spine, initial encounter: Secondary | ICD-10-CM | POA: Diagnosis not present

## 2019-01-02 DIAGNOSIS — M9902 Segmental and somatic dysfunction of thoracic region: Secondary | ICD-10-CM | POA: Diagnosis not present

## 2019-01-10 DIAGNOSIS — M531 Cervicobrachial syndrome: Secondary | ICD-10-CM | POA: Diagnosis not present

## 2019-01-10 DIAGNOSIS — S233XXA Sprain of ligaments of thoracic spine, initial encounter: Secondary | ICD-10-CM | POA: Diagnosis not present

## 2019-01-10 DIAGNOSIS — M9901 Segmental and somatic dysfunction of cervical region: Secondary | ICD-10-CM | POA: Diagnosis not present

## 2019-01-10 DIAGNOSIS — M9902 Segmental and somatic dysfunction of thoracic region: Secondary | ICD-10-CM | POA: Diagnosis not present

## 2019-01-10 DIAGNOSIS — M9903 Segmental and somatic dysfunction of lumbar region: Secondary | ICD-10-CM | POA: Diagnosis not present

## 2019-01-10 DIAGNOSIS — S338XXA Sprain of other parts of lumbar spine and pelvis, initial encounter: Secondary | ICD-10-CM | POA: Diagnosis not present

## 2019-01-11 DIAGNOSIS — I10 Essential (primary) hypertension: Secondary | ICD-10-CM | POA: Diagnosis not present

## 2019-01-16 DIAGNOSIS — S338XXA Sprain of other parts of lumbar spine and pelvis, initial encounter: Secondary | ICD-10-CM | POA: Diagnosis not present

## 2019-01-16 DIAGNOSIS — M9902 Segmental and somatic dysfunction of thoracic region: Secondary | ICD-10-CM | POA: Diagnosis not present

## 2019-01-16 DIAGNOSIS — M531 Cervicobrachial syndrome: Secondary | ICD-10-CM | POA: Diagnosis not present

## 2019-01-16 DIAGNOSIS — M9903 Segmental and somatic dysfunction of lumbar region: Secondary | ICD-10-CM | POA: Diagnosis not present

## 2019-01-16 DIAGNOSIS — S233XXA Sprain of ligaments of thoracic spine, initial encounter: Secondary | ICD-10-CM | POA: Diagnosis not present

## 2019-01-16 DIAGNOSIS — M9901 Segmental and somatic dysfunction of cervical region: Secondary | ICD-10-CM | POA: Diagnosis not present

## 2019-01-17 ENCOUNTER — Other Ambulatory Visit: Payer: Self-pay

## 2019-01-17 ENCOUNTER — Ambulatory Visit (INDEPENDENT_AMBULATORY_CARE_PROVIDER_SITE_OTHER): Payer: Medicare Other

## 2019-01-17 DIAGNOSIS — R0602 Shortness of breath: Secondary | ICD-10-CM | POA: Diagnosis not present

## 2019-01-18 ENCOUNTER — Telehealth: Payer: Self-pay | Admitting: *Deleted

## 2019-01-18 NOTE — Telephone Encounter (Signed)
Pt voiced understanding - routed to pcp  

## 2019-01-18 NOTE — Telephone Encounter (Signed)
-----   Message from Satira Sark, MD sent at 01/17/2019  5:01 PM EST ----- Results reviewed.  Please let her know that the echocardiogram shows stable LVEF at 55 to 60%.  We are still principally managing diastolic dysfunction, would continue diuretics and keep current follow-up plan.

## 2019-01-23 DIAGNOSIS — M9901 Segmental and somatic dysfunction of cervical region: Secondary | ICD-10-CM | POA: Diagnosis not present

## 2019-01-23 DIAGNOSIS — M9903 Segmental and somatic dysfunction of lumbar region: Secondary | ICD-10-CM | POA: Diagnosis not present

## 2019-01-23 DIAGNOSIS — M9902 Segmental and somatic dysfunction of thoracic region: Secondary | ICD-10-CM | POA: Diagnosis not present

## 2019-01-23 DIAGNOSIS — S233XXA Sprain of ligaments of thoracic spine, initial encounter: Secondary | ICD-10-CM | POA: Diagnosis not present

## 2019-01-23 DIAGNOSIS — M531 Cervicobrachial syndrome: Secondary | ICD-10-CM | POA: Diagnosis not present

## 2019-01-23 DIAGNOSIS — S338XXA Sprain of other parts of lumbar spine and pelvis, initial encounter: Secondary | ICD-10-CM | POA: Diagnosis not present

## 2019-01-24 DIAGNOSIS — I10 Essential (primary) hypertension: Secondary | ICD-10-CM | POA: Diagnosis not present

## 2019-01-24 DIAGNOSIS — I509 Heart failure, unspecified: Secondary | ICD-10-CM | POA: Diagnosis not present

## 2019-01-24 DIAGNOSIS — R0600 Dyspnea, unspecified: Secondary | ICD-10-CM | POA: Diagnosis not present

## 2019-01-24 DIAGNOSIS — Z299 Encounter for prophylactic measures, unspecified: Secondary | ICD-10-CM | POA: Diagnosis not present

## 2019-01-24 DIAGNOSIS — Z6841 Body Mass Index (BMI) 40.0 and over, adult: Secondary | ICD-10-CM | POA: Diagnosis not present

## 2019-01-24 DIAGNOSIS — E7800 Pure hypercholesterolemia, unspecified: Secondary | ICD-10-CM | POA: Diagnosis not present

## 2019-01-24 DIAGNOSIS — Z87891 Personal history of nicotine dependence: Secondary | ICD-10-CM | POA: Diagnosis not present

## 2019-01-27 DIAGNOSIS — I509 Heart failure, unspecified: Secondary | ICD-10-CM | POA: Diagnosis not present

## 2019-01-30 DIAGNOSIS — D649 Anemia, unspecified: Secondary | ICD-10-CM | POA: Diagnosis not present

## 2019-01-30 DIAGNOSIS — I110 Hypertensive heart disease with heart failure: Secondary | ICD-10-CM | POA: Diagnosis not present

## 2019-01-30 DIAGNOSIS — D472 Monoclonal gammopathy: Secondary | ICD-10-CM | POA: Diagnosis not present

## 2019-01-30 DIAGNOSIS — E8809 Other disorders of plasma-protein metabolism, not elsewhere classified: Secondary | ICD-10-CM | POA: Diagnosis not present

## 2019-02-02 ENCOUNTER — Encounter (HOSPITAL_COMMUNITY): Payer: Self-pay | Admitting: Hematology

## 2019-02-02 ENCOUNTER — Other Ambulatory Visit: Payer: Self-pay

## 2019-02-02 ENCOUNTER — Inpatient Hospital Stay (HOSPITAL_COMMUNITY): Payer: Medicare Other

## 2019-02-02 ENCOUNTER — Inpatient Hospital Stay (HOSPITAL_COMMUNITY): Payer: Medicare Other | Attending: Hematology | Admitting: Hematology

## 2019-02-02 DIAGNOSIS — I129 Hypertensive chronic kidney disease with stage 1 through stage 4 chronic kidney disease, or unspecified chronic kidney disease: Secondary | ICD-10-CM | POA: Diagnosis not present

## 2019-02-02 DIAGNOSIS — Z79899 Other long term (current) drug therapy: Secondary | ICD-10-CM | POA: Diagnosis not present

## 2019-02-02 DIAGNOSIS — I10 Essential (primary) hypertension: Secondary | ICD-10-CM | POA: Diagnosis not present

## 2019-02-02 DIAGNOSIS — N184 Chronic kidney disease, stage 4 (severe): Secondary | ICD-10-CM | POA: Diagnosis not present

## 2019-02-02 DIAGNOSIS — D472 Monoclonal gammopathy: Secondary | ICD-10-CM

## 2019-02-02 DIAGNOSIS — Z794 Long term (current) use of insulin: Secondary | ICD-10-CM | POA: Diagnosis not present

## 2019-02-02 DIAGNOSIS — I2510 Atherosclerotic heart disease of native coronary artery without angina pectoris: Secondary | ICD-10-CM | POA: Diagnosis not present

## 2019-02-02 DIAGNOSIS — E1122 Type 2 diabetes mellitus with diabetic chronic kidney disease: Secondary | ICD-10-CM | POA: Insufficient documentation

## 2019-02-02 DIAGNOSIS — E119 Type 2 diabetes mellitus without complications: Secondary | ICD-10-CM | POA: Diagnosis not present

## 2019-02-02 DIAGNOSIS — I509 Heart failure, unspecified: Secondary | ICD-10-CM | POA: Diagnosis not present

## 2019-02-02 DIAGNOSIS — Z87891 Personal history of nicotine dependence: Secondary | ICD-10-CM | POA: Insufficient documentation

## 2019-02-02 DIAGNOSIS — C9000 Multiple myeloma not having achieved remission: Secondary | ICD-10-CM | POA: Insufficient documentation

## 2019-02-02 DIAGNOSIS — E7800 Pure hypercholesterolemia, unspecified: Secondary | ICD-10-CM | POA: Diagnosis not present

## 2019-02-02 LAB — CBC WITH DIFFERENTIAL/PLATELET
Abs Immature Granulocytes: 0.01 10*3/uL (ref 0.00–0.07)
Basophils Absolute: 0.1 10*3/uL (ref 0.0–0.1)
Basophils Relative: 1 %
Eosinophils Absolute: 0.6 10*3/uL — ABNORMAL HIGH (ref 0.0–0.5)
Eosinophils Relative: 9 %
HCT: 36.8 % (ref 36.0–46.0)
Hemoglobin: 11.2 g/dL — ABNORMAL LOW (ref 12.0–15.0)
Immature Granulocytes: 0 %
Lymphocytes Relative: 17 %
Lymphs Abs: 1.1 10*3/uL (ref 0.7–4.0)
MCH: 30.4 pg (ref 26.0–34.0)
MCHC: 30.4 g/dL (ref 30.0–36.0)
MCV: 99.7 fL (ref 80.0–100.0)
Monocytes Absolute: 0.6 10*3/uL (ref 0.1–1.0)
Monocytes Relative: 9 %
Neutro Abs: 4.2 10*3/uL (ref 1.7–7.7)
Neutrophils Relative %: 64 %
Platelets: 163 10*3/uL (ref 150–400)
RBC: 3.69 MIL/uL — ABNORMAL LOW (ref 3.87–5.11)
RDW: 13.6 % (ref 11.5–15.5)
WBC: 6.6 10*3/uL (ref 4.0–10.5)
nRBC: 0 % (ref 0.0–0.2)

## 2019-02-02 LAB — COMPREHENSIVE METABOLIC PANEL
ALT: 16 U/L (ref 0–44)
AST: 16 U/L (ref 15–41)
Albumin: 3.7 g/dL (ref 3.5–5.0)
Alkaline Phosphatase: 54 U/L (ref 38–126)
Anion gap: 7 (ref 5–15)
BUN: 42 mg/dL — ABNORMAL HIGH (ref 8–23)
CO2: 27 mmol/L (ref 22–32)
Calcium: 8.7 mg/dL — ABNORMAL LOW (ref 8.9–10.3)
Chloride: 107 mmol/L (ref 98–111)
Creatinine, Ser: 1.75 mg/dL — ABNORMAL HIGH (ref 0.44–1.00)
GFR calc Af Amer: 34 mL/min — ABNORMAL LOW (ref >60)
GFR calc non Af Amer: 29 mL/min — ABNORMAL LOW (ref >60)
Glucose, Bld: 135 mg/dL — ABNORMAL HIGH (ref 70–99)
Potassium: 5.5 mmol/L — ABNORMAL HIGH (ref 3.5–5.1)
Sodium: 141 mmol/L (ref 135–145)
Total Bilirubin: 0.6 mg/dL (ref 0.3–1.2)
Total Protein: 7 g/dL (ref 6.5–8.1)

## 2019-02-02 NOTE — Progress Notes (Signed)
[address] [place], [place] [id number]   CLINIC:  Medical Oncology/Hematology  PCP:  Monico Blitz, [place] [address] [phone]   REASON FOR VISIT:  Follow-up for MGUS  CURRENT THERAPY: Clinical surveillance    INTERVAL HISTORY:  Ms. Erker 69 y.o. female presents today for follow-up.  She reports overall doing well.  She had a consult with myeloma specialist at [facility] several weeks ago.  Provider at [facility] is concerned patient may have amyloidosis and has recommended further work-up with cardiac MRI, she also states she had an episode of congestive heart failure her BNP at that time was above 8000.  She has been seen by her cardiologist and is currently on Lasix.  She believes her last BMP was down to 4000.  She states her breathing has improved.  She still gets short of breath with exertion.  This does resolve with rest.  She is here for repeat labs and office visit.    REVIEW OF SYSTEMS:  Review of Systems  Constitutional: Positive for fatigue.  HENT:  Negative.   Eyes: Negative.   Respiratory: Positive for shortness of breath.   Cardiovascular: Positive for leg swelling.  Gastrointestinal: Negative.   Endocrine: Negative.   Genitourinary: Negative.    Musculoskeletal: Positive for arthralgias and back pain.  Skin: Negative.   Neurological: Positive for extremity weakness.  Hematological: Negative.   Psychiatric/Behavioral: Negative.      PAST MEDICAL/SURGICAL HISTORY:  Past Medical History:  Diagnosis Date  . Bipolar disorder ([place])   . Coronary atherosclerosis of native coronary artery    BMS circ 2006, 70% PDA 2009  . Essential hypertension   . Mixed hyperlipidemia   . OSA on CPAP   . Renal tubular acidosis, type IV   . SVT (supraventricular tachycardia) ([place])   . Type 2 diabetes mellitus ([place])    Past Surgical History:  Procedure Laterality Date  . Acromioclavicular arthritis    . CARDIAC CATHETERIZATION N/A  03/29/2015   Procedure: Left Heart Cath and Coronary Angiography;  Surgeon: Troy Sine, MD;  Location: [facility];  Service: Cardiovascular;  Laterality: N/A;  . RIght shoulder adhesive capsulitis    . Rotator cuff impingement syndrome    . Rotator cuff tear       SOCIAL HISTORY:  Social History   Socioeconomic History  . Marital status: Divorced    Spouse name: Not on file  . Number of children: Not on file  . Years of education: Not on file  . Highest education level: Not on file  Occupational History  . Occupation: PLANNING AND INSPECTOR    Employer: [employer]  Tobacco Use  . Smoking status: Former Smoker    Packs/day: 1.00    Years: 20.00    Pack years: 20.00    Types: Cigarettes    Start date: 10/30/1984    Quit date: 07/08/2013    Years since quitting: 5.5  . Smokeless tobacco: Never Used  Substance and Sexual Activity  . Alcohol use: No    Alcohol/week: 0.0 standard drinks  . Drug use: No  . Sexual activity: Not on file  Other Topics Concern  . Not on file  Social History Narrative   She has lived with some of her daughters. Works part-time for the city.    Social Determinants of Health   Financial Resource Strain:   . Difficulty of Paying Living Expenses: Not on file  Food Insecurity:   .  Worried About Charity fundraiser in the Last Year: Not on file  . Ran Out of Food in the Last Year: Not on file  Transportation Needs:   . Lack of Transportation (Medical): Not on file  . Lack of Transportation (Non-Medical): Not on file  Physical Activity:   . Days of Exercise per Week: Not on file  . Minutes of Exercise per Session: Not on file  Stress:   . Feeling of Stress : Not on file  Social Connections:   . Frequency of Communication with Friends and Family: Not on file  . Frequency of Social Gatherings with Friends and Family: Not on file  . Attends Religious Services: Not on file  . Active Member of Clubs or Organizations: Not on file  .  Attends Archivist Meetings: Not on file  . Marital Status: Not on file  Intimate Partner Violence:   . Fear of Current or Ex-Partner: Not on file  . Emotionally Abused: Not on file  . Physically Abused: Not on file  . Sexually Abused: Not on file    FAMILY HISTORY:  Family History  Problem Relation Age of Onset  . Bipolar disorder Brother   . Diabetes Father     CURRENT MEDICATIONS:  Outpatient Encounter Medications as of 02/02/2019  Medication Sig  . allopurinol (ZYLOPRIM) 300 MG tablet Take 150 mg by mouth daily.  Marland Kitchen amLODipine (NORVASC) 10 MG tablet Take 10 mg by mouth daily.  Marland Kitchen aspirin 81 MG EC tablet Take 81 mg by mouth daily.    Marland Kitchen buPROPion (WELLBUTRIN XL) 300 MG 24 hr tablet Take 300 mg by mouth daily.  . calcium carbonate (OS-CAL) 600 MG TABS Take 600 mg by mouth daily.    . Cholecalciferol (VITAMIN D3) 25 MCG (1000 UT) CAPS Take 1 capsule by mouth daily.  . divalproex (DEPAKOTE) 250 MG EC tablet Take 250 mg by mouth daily.    . furosemide (LASIX) 80 MG tablet Take 80 mg by mouth daily.  . hydrALAZINE (APRESOLINE) 25 MG tablet Take 1 tablet (25 mg total) by mouth 3 (three) times daily.  . insulin aspart (NOVOLOG) 100 UNIT/ML injection Inject into the skin. 10 units before bedtime meal  . isosorbide mononitrate (IMDUR) 60 MG 24 hr tablet Take 1.5 tablets (90 mg total) by mouth daily.  Marland Kitchen labetalol (NORMODYNE) 100 MG tablet Take 1 tablet (100 mg total) by mouth 2 (two) times daily.  Marland Kitchen labetalol (NORMODYNE) 300 MG tablet Take 1 tablet (300 mg total) by mouth 2 (two) times daily.  . Liraglutide (VICTOZA) 18 MG/3ML SOLN Inject 1.2 mg into the skin daily.   . Multiple Vitamin (MULTIVITAMIN) tablet Take 1 tablet by mouth daily.    . potassium chloride SA (KLOR-CON) 20 MEQ tablet Take 20 mEq by mouth daily.  . rosuvastatin (CRESTOR) 10 MG tablet Take 10 mg by mouth daily.  . sertraline (ZOLOFT) 100 MG tablet Take 100 mg by mouth daily.    Marland Kitchen torsemide (DEMADEX) 20 MG  tablet Take 80 mg by mouth every morning.  . traZODone (DESYREL) 100 MG tablet TAKE ONE TAB BY MOUTH AT BEDTIME  . TRESIBA FLEXTOUCH 200 UNIT/ML SOPN Inject 62 Units into the skin every morning.   Marland Kitchen BESIVANCE 0.6 % SUSP INSTILL ONE DROP IN THE LEFT EYE FOUR TIMES DAILY FOR 2 DAYS AFTER EACH MONTHLY EYE INJECTION  . meclizine (ANTIVERT) 25 MG tablet Take 25 mg by mouth as needed.   . nitroGLYCERIN (NITROLINGUAL) 0.4 MG/SPRAY spray  Place 1 spray under the tongue every 5 (five) minutes x 3 doses as needed. (Patient not taking: Reported on 02/02/2019)   No facility-administered encounter medications on file as of 02/02/2019.    ALLERGIES:  Allergies  Allergen Reactions  . Ace Inhibitors   . Lisinopril     Severe cough  . Penicillins   . Tetracycline   . Lamictal [Lamotrigine] Rash    Rash start inside to outter body.     PHYSICAL EXAM:  ECOG Performance status: 1  Vitals:   02/02/19 1306  BP: (!) 122/92  Pulse: 72  Resp: 20  Temp: 97.9 F (36.6 C)  SpO2: 92%   Filed Weights   02/02/19 1306  Weight: 228 lb 1.6 oz (103.5 kg)    Physical Exam Constitutional:      Appearance: Normal appearance.  HENT:     Head: Normocephalic.     Right Ear: External ear normal.     Left Ear: External ear normal.     Nose: Nose normal.     Mouth/Throat:     Pharynx: Oropharynx is clear.  Cardiovascular:     Rate and Rhythm: Normal rate and regular rhythm.     Pulses: Normal pulses.     Heart sounds: Normal heart sounds.  Pulmonary:     Effort: Pulmonary effort is normal.  Abdominal:     General: Bowel sounds are normal.  Musculoskeletal:     Cervical back: Normal range of motion.     Comments: Decreased ROM   Skin:    General: Skin is warm.  Neurological:     General: No focal deficit present.     Mental Status: She is alert and oriented to person, place, and time.  Psychiatric:        Mood and Affect: Mood normal.      LABORATORY DATA:  I have reviewed the labs as  listed.  CBC    Component Value Date/Time   WBC 6.6 02/02/2019 1222   RBC 3.69 (L) 02/02/2019 1222   HGB 11.2 (L) 02/02/2019 1222   HCT 36.8 02/02/2019 1222   PLT 163 02/02/2019 1222   MCV 99.7 02/02/2019 1222   MCH 30.4 02/02/2019 1222   MCHC 30.4 02/02/2019 1222   RDW 13.6 02/02/2019 1222   LYMPHSABS 1.1 02/02/2019 1222   MONOABS 0.6 02/02/2019 1222   EOSABS 0.6 (H) 02/02/2019 1222   BASOSABS 0.1 02/02/2019 1222   CMP Latest Ref Rng & Units 02/02/2019 11/08/2018 07/12/2018  Glucose 70 - 99 mg/dL 135(H) 164(H) -  BUN 8 - 23 mg/dL 42(H) 45(H) -  Creatinine 0.44 - 1.00 mg/dL 1.75(H) 1.90(H) -  Sodium 135 - 145 mmol/L 141 136 -  Potassium 3.5 - 5.1 mmol/L 5.5(H) 5.0 4.8  Chloride 98 - 111 mmol/L 107 104 -  CO2 22 - 32 mmol/L 27 24 -  Calcium 8.9 - 10.3 mg/dL 8.7(L) 8.3(L) -  Total Protein 6.5 - 8.1 g/dL 7.0 6.5 -  Total Bilirubin 0.3 - 1.2 mg/dL 0.6 0.3 -  Alkaline Phos 38 - 126 U/L 54 50 -  AST 15 - 41 U/L 16 19 -  ALT 0 - 44 U/L 16 19 -          ASSESSMENT & PLAN:   MGUS (monoclonal gammopathy of unknown significance) 1.  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  She was noted to have an elevated creatinine and was seen by nephrologist  in December 2014.  She had an SPEP done as part of her work-up for kidney disease and was noted to have a M spike of 0.6 with normal lambda light chain ratio.  Immunofixation showed IgG kappa monoclonal protein.  Patient had hep C antibody which was negative, ANA negative and C ANCA negative, P-ANCA negative, anti-double-stranded DNA antibody negative and complements were within normal limits.  Her creatinine at that time was 1.32 and hemoglobin was 11.8.  Also she had no significant protein in her urine. -She also had a bone scan which was noted to be negative.  She never had a bone marrow biopsy done at this time. - Patient was being followed by Dr.  Tressie Stalker at our clinic since early 2017. - Labs on 06/01/2018 shows potassium 5.1, creatinine 1.67, calcium 8.6, LFTs were normal, LDH 213, SPEP stable with M spike 0.6 g/dL.  WBC 7.8, hemoglobin 9.9, platelets 156.  Light chain ratio 1.82. --Skeletal survey done on 07/05/2018 showed no lytic lesions. -Multiple myeloma labs completed on November 08, 2018 reveal a M spike of 0.5 with a spike in the gamma region concerning for monoclonal protein.  Kappa light chains elevated at 84.1, lambda free light chains elevated at 51.9 ratio 1.62. -I trended the patient's labs out since 2017, patient's anemia and renal function has progressively gotten worse.  This is concerning for disease progression to active multiple myeloma.  Bone marrow biopsy was never done.  Per NCCN guidelines, I have recommended patient proceed with a bone marrow biopsy and aspiration to determine amount of plasma cells in the bone marrow.  Patient also need to repeat skeletal survey and a 24-hour urine protein electrophoresis.  -I also discussed possible treatment of myeloma with patient due to the patient's renal insufficiency she would be a candidate for CyBorD or RVD with Revlimid dose reduced. -Marrow biopsy was completed on 11/25/2018.  Pathology consistent with hypercellular marrow with trilineage hematopoiesis and 2% plasma cells.  In review of biopsy report, the specimen was inadequate.  FISH was negative.  Cytogenetics were normal. -I have asked patient to seek out a second opinion regarding her case.  Also repeating her bone marrow biopsy is an option.  I do not feel that the amount of plasma cells is consistent with the amount of end organ disease. -Patient was able to meet with a myeloma specialist at [facility].  There is some concern patient may have underlying amyloid.  She is scheduled for a cardiac MRI and repeat bone marrow biopsy on February 4 at [facility]. -I will follow up with her in 4 weeks to review scan and biopsy.         [place] [phone]

## 2019-02-02 NOTE — Assessment & Plan Note (Signed)
1.  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  She was noted to have an elevated creatinine and was seen by nephrologist in December 2014.  She had an SPEP done as part of her work-up for kidney disease and was noted to have a M spike of 0.6 with normal lambda light chain ratio.  Immunofixation showed IgG kappa monoclonal protein.  Patient had hep C antibody which was negative, ANA negative and C ANCA negative, P-ANCA negative, anti-double-stranded DNA antibody negative and complements were within normal limits.  Her creatinine at that time was 1.32 and hemoglobin was 11.8.  Also she had no significant protein in her urine. -She also had a bone scan which was noted to be negative.  She never had a bone marrow biopsy done at this time. - Patient was being followed by Dr. Tressie Stalker at our clinic since early 2017. - Labs on 06/01/2018 shows potassium 5.1, creatinine 1.67, calcium 8.6, LFTs were normal, LDH 213, SPEP stable with M spike 0.6 g/dL.  WBC 7.8, hemoglobin 9.9, platelets 156.  Light chain ratio 1.82. --Skeletal survey done on 07/05/2018 showed no lytic lesions. -Multiple myeloma labs completed on November 08, 2018 reveal a M spike of 0.5 with a spike in the gamma region concerning for monoclonal protein.  Kappa light chains elevated at 84.1, lambda free light chains elevated at 51.9 ratio 1.62. -I trended the patient's labs out since 2017, patient's anemia and renal function has progressively gotten worse.  This is concerning for disease progression to active multiple myeloma.  Bone marrow biopsy was never done.  Per NCCN guidelines, I have recommended patient proceed with a bone marrow biopsy and aspiration to determine amount of plasma cells in the bone marrow.  Patient also need to repeat skeletal survey and a 24-hour urine protein electrophoresis.  -I also discussed possible treatment of myeloma with patient due to  the patient's renal insufficiency she would be a candidate for CyBorD or RVD with Revlimid dose reduced. -Marrow biopsy was completed on 11/25/2018.  Pathology consistent with hypercellular marrow with trilineage hematopoiesis and 2% plasma cells.  In review of biopsy report, the specimen was inadequate.  FISH was negative.  Cytogenetics were normal. -I have asked patient to seek out a second opinion regarding her case.  Also repeating her bone marrow biopsy is an option.  I do not feel that the amount of plasma cells is consistent with the amount of end organ disease. -Patient was able to meet with a myeloma specialist at [facility].  There is some concern patient may have underlying amyloid.  She is scheduled for a cardiac MRI and repeat bone marrow biopsy on February 4 at [facility]. -I will follow up with her in 4 weeks to review scan and biopsy.

## 2019-02-08 DIAGNOSIS — D472 Monoclonal gammopathy: Secondary | ICD-10-CM | POA: Diagnosis not present

## 2019-02-08 DIAGNOSIS — I110 Hypertensive heart disease with heart failure: Secondary | ICD-10-CM | POA: Diagnosis not present

## 2019-02-08 DIAGNOSIS — E8809 Other disorders of plasma-protein metabolism, not elsewhere classified: Secondary | ICD-10-CM | POA: Diagnosis not present

## 2019-02-08 DIAGNOSIS — I509 Heart failure, unspecified: Secondary | ICD-10-CM | POA: Diagnosis not present

## 2019-02-08 DIAGNOSIS — D72822 Plasmacytosis: Secondary | ICD-10-CM | POA: Diagnosis not present

## 2019-02-14 DIAGNOSIS — S338XXA Sprain of other parts of lumbar spine and pelvis, initial encounter: Secondary | ICD-10-CM | POA: Diagnosis not present

## 2019-02-14 DIAGNOSIS — S233XXA Sprain of ligaments of thoracic spine, initial encounter: Secondary | ICD-10-CM | POA: Diagnosis not present

## 2019-02-14 DIAGNOSIS — M9903 Segmental and somatic dysfunction of lumbar region: Secondary | ICD-10-CM | POA: Diagnosis not present

## 2019-02-14 DIAGNOSIS — M531 Cervicobrachial syndrome: Secondary | ICD-10-CM | POA: Diagnosis not present

## 2019-02-14 DIAGNOSIS — M9901 Segmental and somatic dysfunction of cervical region: Secondary | ICD-10-CM | POA: Diagnosis not present

## 2019-02-14 DIAGNOSIS — M9902 Segmental and somatic dysfunction of thoracic region: Secondary | ICD-10-CM | POA: Diagnosis not present

## 2019-02-15 DIAGNOSIS — Z87891 Personal history of nicotine dependence: Secondary | ICD-10-CM | POA: Diagnosis not present

## 2019-02-15 DIAGNOSIS — D472 Monoclonal gammopathy: Secondary | ICD-10-CM | POA: Diagnosis not present

## 2019-02-15 DIAGNOSIS — E1122 Type 2 diabetes mellitus with diabetic chronic kidney disease: Secondary | ICD-10-CM | POA: Diagnosis not present

## 2019-02-15 DIAGNOSIS — I10 Essential (primary) hypertension: Secondary | ICD-10-CM | POA: Diagnosis not present

## 2019-02-15 DIAGNOSIS — I5032 Chronic diastolic (congestive) heart failure: Secondary | ICD-10-CM | POA: Diagnosis not present

## 2019-02-15 DIAGNOSIS — Z299 Encounter for prophylactic measures, unspecified: Secondary | ICD-10-CM | POA: Diagnosis not present

## 2019-02-15 DIAGNOSIS — Z6841 Body Mass Index (BMI) 40.0 and over, adult: Secondary | ICD-10-CM | POA: Diagnosis not present

## 2019-02-15 DIAGNOSIS — E1165 Type 2 diabetes mellitus with hyperglycemia: Secondary | ICD-10-CM | POA: Diagnosis not present

## 2019-02-23 DIAGNOSIS — I420 Dilated cardiomyopathy: Secondary | ICD-10-CM | POA: Diagnosis not present

## 2019-02-23 DIAGNOSIS — D472 Monoclonal gammopathy: Secondary | ICD-10-CM | POA: Diagnosis not present

## 2019-02-23 DIAGNOSIS — N1832 Chronic kidney disease, stage 3b: Secondary | ICD-10-CM | POA: Diagnosis not present

## 2019-02-23 DIAGNOSIS — D649 Anemia, unspecified: Secondary | ICD-10-CM | POA: Diagnosis not present

## 2019-02-28 DIAGNOSIS — L110 Acquired keratosis follicularis: Secondary | ICD-10-CM | POA: Diagnosis not present

## 2019-02-28 DIAGNOSIS — E1140 Type 2 diabetes mellitus with diabetic neuropathy, unspecified: Secondary | ICD-10-CM | POA: Diagnosis not present

## 2019-02-28 DIAGNOSIS — L609 Nail disorder, unspecified: Secondary | ICD-10-CM | POA: Diagnosis not present

## 2019-03-03 ENCOUNTER — Other Ambulatory Visit: Payer: Self-pay

## 2019-03-03 ENCOUNTER — Inpatient Hospital Stay (HOSPITAL_COMMUNITY): Payer: Medicare Other | Attending: Hematology | Admitting: Nurse Practitioner

## 2019-03-03 DIAGNOSIS — Z79899 Other long term (current) drug therapy: Secondary | ICD-10-CM | POA: Insufficient documentation

## 2019-03-03 DIAGNOSIS — I2510 Atherosclerotic heart disease of native coronary artery without angina pectoris: Secondary | ICD-10-CM | POA: Insufficient documentation

## 2019-03-03 DIAGNOSIS — Z7982 Long term (current) use of aspirin: Secondary | ICD-10-CM | POA: Insufficient documentation

## 2019-03-03 DIAGNOSIS — I129 Hypertensive chronic kidney disease with stage 1 through stage 4 chronic kidney disease, or unspecified chronic kidney disease: Secondary | ICD-10-CM | POA: Insufficient documentation

## 2019-03-03 DIAGNOSIS — E1122 Type 2 diabetes mellitus with diabetic chronic kidney disease: Secondary | ICD-10-CM | POA: Insufficient documentation

## 2019-03-03 DIAGNOSIS — D472 Monoclonal gammopathy: Secondary | ICD-10-CM | POA: Diagnosis not present

## 2019-03-03 DIAGNOSIS — Z87891 Personal history of nicotine dependence: Secondary | ICD-10-CM | POA: Diagnosis not present

## 2019-03-03 DIAGNOSIS — D649 Anemia, unspecified: Secondary | ICD-10-CM | POA: Diagnosis not present

## 2019-03-03 DIAGNOSIS — Z794 Long term (current) use of insulin: Secondary | ICD-10-CM | POA: Insufficient documentation

## 2019-03-03 DIAGNOSIS — F319 Bipolar disorder, unspecified: Secondary | ICD-10-CM | POA: Insufficient documentation

## 2019-03-03 DIAGNOSIS — N184 Chronic kidney disease, stage 4 (severe): Secondary | ICD-10-CM | POA: Insufficient documentation

## 2019-03-03 NOTE — Progress Notes (Signed)
Christy [name],  [id number]   CLINIC:  Medical Oncology/Hematology  PCP:  Monico Blitz, [place] [address] [phone]   REASON FOR VISIT: Follow-up for MGUS  CURRENT THERAPY: Observation    INTERVAL HISTORY:  Christy [name] 69 y.o. female returns for routine follow-up for MGUS.  Patient reports she is doing well since her last visit.  She has no complaints at this time. Denies any nausea, vomiting, or diarrhea. Denies any new pains. Had not noticed any recent bleeding such as epistaxis, hematuria or hematochezia. Denies recent chest pain on exertion, shortness of breath on minimal exertion, pre-syncopal episodes, or palpitations. Denies any numbness or tingling in hands or feet. Denies any recent fevers, infections, or recent hospitalizations. Patient reports appetite at 100% and energy level at 50%.  She is eating well maintain her weight at this time.    REVIEW OF SYSTEMS:  Review of Systems  Constitutional: Positive for fatigue.  Respiratory: Positive for shortness of breath.   Cardiovascular: Positive for leg swelling.  All other systems reviewed and are negative.    PAST MEDICAL/SURGICAL HISTORY:  Past Medical History:  Diagnosis Date  . Bipolar disorder ([place])   . Coronary atherosclerosis of native coronary artery    BMS circ 2006, 70% PDA 2009  . Essential hypertension   . Mixed hyperlipidemia   . OSA on CPAP   . Renal tubular acidosis, type IV   . SVT (supraventricular tachycardia) ([place])   . Type 2 diabetes mellitus ([place])    Past Surgical History:  Procedure Laterality Date  . Acromioclavicular arthritis    . CARDIAC CATHETERIZATION N/A 03/29/2015   Procedure: Left Heart Cath and Coronary Angiography;  Surgeon: Troy Sine, MD;  Location: [facility];  Service: Cardiovascular;  Laterality: N/A;  . RIght shoulder adhesive capsulitis    . Rotator cuff impingement syndrome    . Rotator cuff tear        SOCIAL HISTORY:  Social History   Socioeconomic History  . Marital status: Divorced    Spouse name: Not on file  . Number of children: Not on file  . Years of education: Not on file  . Highest education level: Not on file  Occupational History  . Occupation: PLANNING AND INSPECTOR    Employer: [employer]  Tobacco Use  . Smoking status: Former Smoker    Packs/day: 1.00    Years: 20.00    Pack years: 20.00    Types: Cigarettes    Start date: 10/30/1984    Quit date: 07/08/2013    Years since quitting: 5.6  . Smokeless tobacco: Never Used  Substance and Sexual Activity  . Alcohol use: No    Alcohol/week: 0.0 standard drinks  . Drug use: No  . Sexual activity: Not on file  Other Topics Concern  . Not on file  Social History Narrative   She has lived with some of her daughters. Works part-time for the city.    Social Determinants of Health   Financial Resource Strain:   . Difficulty of Paying Living Expenses: Not on file  Food Insecurity:   . Worried About Charity fundraiser in the Last Year: Not on file  . Ran Out of Food in the Last Year: Not on file  Transportation Needs:   . Lack of Transportation (Medical): Not on file  . Lack of Transportation (Non-Medical): Not on file  Physical Activity:   .  Days of Exercise per Week: Not on file  . Minutes of Exercise per Session: Not on file  Stress:   . Feeling of Stress : Not on file  Social Connections:   . Frequency of Communication with Friends and Family: Not on file  . Frequency of Social Gatherings with Friends and Family: Not on file  . Attends Religious Services: Not on file  . Active Member of Clubs or Organizations: Not on file  . Attends Archivist Meetings: Not on file  . Marital Status: Not on file  Intimate Partner Violence:   . Fear of Current or Ex-Partner: Not on file  . Emotionally Abused: Not on file  . Physically Abused: Not on file  . Sexually Abused: Not on file    FAMILY  HISTORY:  Family History  Problem Relation Age of Onset  . Bipolar disorder Brother   . Diabetes Father     CURRENT MEDICATIONS:  Outpatient Encounter Medications as of 03/03/2019  Medication Sig  . allopurinol (ZYLOPRIM) 300 MG tablet Take 150 mg by mouth daily.  Marland Kitchen amLODipine (NORVASC) 10 MG tablet Take 10 mg by mouth daily.  Marland Kitchen aspirin 81 MG EC tablet Take 81 mg by mouth daily.    Marland Kitchen BESIVANCE 0.6 % SUSP INSTILL ONE DROP IN THE LEFT EYE FOUR TIMES DAILY FOR 2 DAYS AFTER EACH MONTHLY EYE INJECTION  . buPROPion (WELLBUTRIN XL) 300 MG 24 hr tablet Take 300 mg by mouth daily.  . calcium carbonate (OS-CAL) 600 MG TABS Take 600 mg by mouth daily.    . Cholecalciferol (VITAMIN D3) 25 MCG (1000 UT) CAPS Take 1 capsule by mouth daily.  . divalproex (DEPAKOTE) 250 MG EC tablet Take 250 mg by mouth daily.    . hydrALAZINE (APRESOLINE) 25 MG tablet Take 1 tablet (25 mg total) by mouth 3 (three) times daily.  . insulin aspart (NOVOLOG) 100 UNIT/ML injection Inject 10 Units into the skin daily. 10 units before bedtime meal  . isosorbide mononitrate (IMDUR) 60 MG 24 hr tablet Take 1.5 tablets (90 mg total) by mouth daily.  Marland Kitchen labetalol (NORMODYNE) 100 MG tablet Take 1 tablet (100 mg total) by mouth 2 (two) times daily.  Marland Kitchen labetalol (NORMODYNE) 300 MG tablet Take 1 tablet (300 mg total) by mouth 2 (two) times daily.  . Liraglutide (VICTOZA) 18 MG/3ML SOLN Inject 1.2 mg into the skin daily.   . Multiple Vitamin (MULTIVITAMIN) tablet Take 1 tablet by mouth daily.    . rosuvastatin (CRESTOR) 10 MG tablet Take 10 mg by mouth daily.  . sertraline (ZOLOFT) 100 MG tablet Take 100 mg by mouth daily.    Marland Kitchen torsemide (DEMADEX) 20 MG tablet Take 80 mg by mouth every morning.  . traZODone (DESYREL) 100 MG tablet TAKE ONE TAB BY MOUTH AT BEDTIME  . TRESIBA FLEXTOUCH 200 UNIT/ML SOPN Inject 50 Units into the skin every morning.   . meclizine (ANTIVERT) 25 MG tablet Take 25 mg by mouth as needed.   . nitroGLYCERIN  (NITROLINGUAL) 0.4 MG/SPRAY spray Place 1 spray under the tongue every 5 (five) minutes x 3 doses as needed. (Patient not taking: Reported on 02/02/2019)  . [DISCONTINUED] furosemide (LASIX) 80 MG tablet Take 80 mg by mouth daily.  . [DISCONTINUED] potassium chloride SA (KLOR-CON) 20 MEQ tablet Take 20 mEq by mouth daily.   No facility-administered encounter medications on file as of 03/03/2019.    ALLERGIES:  Allergies  Allergen Reactions  . Ace Inhibitors   . Lisinopril  Severe cough  . Penicillins   . Tetracycline   . Lamictal [Lamotrigine] Rash    Rash start inside to outter body.     PHYSICAL EXAM:  ECOG Performance status: 1  Vitals:   03/03/19 1105  BP: (!) 134/45  Pulse: (!) 51  Resp: 18  Temp: (!) 97.1 F (36.2 C)  SpO2: 95%   Filed Weights   03/03/19 1105  Weight: 237 lb 12.8 oz (107.9 kg)    Physical Exam Constitutional:      Appearance: Normal appearance. She is normal weight.  Cardiovascular:     Rate and Rhythm: Normal rate and regular rhythm.     Heart sounds: Normal heart sounds.  Pulmonary:     Effort: Pulmonary effort is normal.     Breath sounds: Normal breath sounds.  Abdominal:     General: Bowel sounds are normal.     Palpations: Abdomen is soft.  Musculoskeletal:        General: Normal range of motion.  Skin:    General: Skin is warm.  Neurological:     Mental Status: She is alert and oriented to person, place, and time. Mental status is at baseline.  Psychiatric:        Mood and Affect: Mood normal.        Behavior: Behavior normal.        Thought Content: Thought content normal.        Judgment: Judgment normal.      LABORATORY DATA:  I have reviewed the labs as listed.  CBC    Component Value Date/Time   WBC 6.6 02/02/2019 1222   RBC 3.69 (L) 02/02/2019 1222   HGB 11.2 (L) 02/02/2019 1222   HCT 36.8 02/02/2019 1222   PLT 163 02/02/2019 1222   MCV 99.7 02/02/2019 1222   MCH 30.4 02/02/2019 1222   MCHC 30.4  02/02/2019 1222   RDW 13.6 02/02/2019 1222   LYMPHSABS 1.1 02/02/2019 1222   MONOABS 0.6 02/02/2019 1222   EOSABS 0.6 (H) 02/02/2019 1222   BASOSABS 0.1 02/02/2019 1222   CMP Latest Ref Rng & Units 02/02/2019 11/08/2018 07/12/2018  Glucose 70 - 99 mg/dL 135(H) 164(H) -  BUN 8 - 23 mg/dL 42(H) 45(H) -  Creatinine 0.44 - 1.00 mg/dL 1.75(H) 1.90(H) -  Sodium 135 - 145 mmol/L 141 136 -  Potassium 3.5 - 5.1 mmol/L 5.5(H) 5.0 4.8  Chloride 98 - 111 mmol/L 107 104 -  CO2 22 - 32 mmol/L 27 24 -  Calcium 8.9 - 10.3 mg/dL 8.7(L) 8.3(L) -  Total Protein 6.5 - 8.1 g/dL 7.0 6.5 -  Total Bilirubin 0.3 - 1.2 mg/dL 0.6 0.3 -  Alkaline Phos 38 - 126 U/L 54 50 -  AST 15 - 41 U/L 16 19 -  ALT 0 - 44 U/L 16 19 -    I personally performed a face-to-face visit.  All questions were answered to patient's stated satisfaction. Encouraged patient to call with any new concerns or questions before his next visit to the cancer center and we can certain see him sooner, if needed.     ASSESSMENT & PLAN:   MGUS (monoclonal gammopathy of unknown significance) 1.  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  She was noted to have an elevated creatinine and was seen by nephrologist in December 2014.  She had an SPEP done as part of her work-up for kidney disease and was noted to  have a M spike of 0.6 with normal lambda light chain ratio.  Immunofixation showed IgG kappa monoclonal protein.  Patient had hep C antibody which was negative, ANA negative and C ANCA negative, P-ANCA negative, anti-double-stranded DNA antibody negative and complements were within normal limits.  Her creatinine at that time was 1.32 and hemoglobin was 11.8.  Also she had no significant protein in her urine. -She also had a bone scan which was noted to be negative.  She never had a bone marrow biopsy done at this time. - Patient was being followed by Dr. Tressie Stalker  at our clinic since early 2017. - Labs on 06/01/2018 shows potassium 5.1, creatinine 1.67, calcium 8.6, LFTs were normal, LDH 213, SPEP stable with M spike 0.6 g/dL.  WBC 7.8, hemoglobin 9.9, platelets 156.  Light chain ratio 1.82. -Labs on 07/05/2018 showed her potassium 5.5, creatinine 1.72, calcium 8.7, hemoglobin 10.1. -We repeated her potassium at her visit on 07/12/2018 which showed her potassium 4.8. - She reports no B symptoms including fevers, chills, night sweats, or unexplained weight loss. -Skeletal survey done on 07/05/2018 showed no lytic lesions. -Bone marrow biopsy done on 11/25/2018.  Pathology consistent with hypercellular marrow with trilineage hematopoiesis and 2% plasma cells.  FISH was negative.  Cytogenetics was normal. -When trending her anemia and renal function since 2017 it has progressively gotten worse.  Which is concerning for multiple myeloma.  Patient was sent to [facility] for second opinion. -Bone marrow biopsy done by [facility] on 02/08/2019.  Pathology was consistent with normocellular bone marrow with trilineage hematopoiesis.  And 3% plasma cells.  FISH was negative.  Negative for amyloid deposits position by stain. -Cardiac MRI done at [facility] on 02/08/2019 showed the left ventricle moderately dilated in size with normal wall thickness.  Global systolic function is moderately reduced with a LVEF of 44%. -Her MGUS is stable at this time.  She needs referrals to cardiologist and nephrologist for work-up. [facility] referred her to a cardiologist in their system for a second opinion she sees this doctor on 03/06/2019. -She will follow-up in 4 months with repeat labs  2.  Anemia: - Likely due from CKD. -Labs on 02/02/2019 showed her hemoglobin 11.2, creatinine 1.75 -We will recheck iron studies on her next visit.  3.  Renal insufficiency: - She has chronic kidney disease stage IV thought to be related to her  diabetes, hypertension and possibly FSGS related to obesity. -Labs on 02/02/2019 showed her creatinine 1.75 -She has a follow-up appointment with her nephrologist on 03/17/2019.      Orders placed this encounter:  Orders Placed This Encounter  Procedures  . CBC with Differential/Platelet  . Comprehensive metabolic panel  . Ferritin  . Iron and TIBC  . TSH  . Vitamin B12  . VITAMIN D 25 Hydroxy (Vit-D Deficiency, Fractures)  . Folate  . Lactate dehydrogenase  . Protein electrophoresis, serum  . Kappa/lambda light chains      Francene Finders, FNP-C [place] [phone]

## 2019-03-03 NOTE — Assessment & Plan Note (Signed)
1.  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  She was noted to have an elevated creatinine and was seen by nephrologist in December 2014.  She had an SPEP done as part of her work-up for kidney disease and was noted to have a M spike of 0.6 with normal lambda light chain ratio.  Immunofixation showed IgG kappa monoclonal protein.  Patient had hep C antibody which was negative, ANA negative and C ANCA negative, P-ANCA negative, anti-double-stranded DNA antibody negative and complements were within normal limits.  Her creatinine at that time was 1.32 and hemoglobin was 11.8.  Also she had no significant protein in her urine. -She also had a bone scan which was noted to be negative.  She never had a bone marrow biopsy done at this time. - Patient was being followed by Dr. Neijstrom at our clinic since early 2017. - Labs on 06/01/2018 shows potassium 5.1, creatinine 1.67, calcium 8.6, LFTs were normal, LDH 213, SPEP stable with M spike 0.6 g/dL.  WBC 7.8, hemoglobin 9.9, platelets 156.  Light chain ratio 1.82. -Labs on 07/05/2018 showed her potassium 5.5, creatinine 1.72, calcium 8.7, hemoglobin 10.1. -We repeated her potassium at her visit on 07/12/2018 which showed her potassium 4.8. - She reports no B symptoms including fevers, chills, night sweats, or unexplained weight loss. -Skeletal survey done on 07/05/2018 showed no lytic lesions. -Bone marrow biopsy done on 11/25/2018.  Pathology consistent with hypercellular marrow with trilineage hematopoiesis and 2% plasma cells.  FISH was negative.  Cytogenetics was normal. -When trending her anemia and renal function since 2017 it has progressively gotten worse.  Which is concerning for multiple myeloma.  Patient was sent to [facility] for second opinion. -Bone marrow biopsy done by [facility] on 02/08/2019.  Pathology was consistent with normocellular  bone marrow with trilineage hematopoiesis.  And 3% plasma cells.  FISH was negative.  Negative for amyloid deposits position by stain. -Cardiac MRI done at [facility] on 02/08/2019 showed the left ventricle moderately dilated in size with normal wall thickness.  Global systolic function is moderately reduced with a LVEF of 44%. -Her MGUS is stable at this time.  She needs referrals to cardiologist and nephrologist for work-up. -[facility] referred her to a cardiologist in their system for a second opinion she sees this doctor on 03/06/2019. -She will follow-up in 4 months with repeat labs  2.  Anemia: - Likely due from CKD. -Labs on 02/02/2019 showed her hemoglobin 11.2, creatinine 1.75 -We will recheck iron studies on her next visit.  3.  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 -Labs on 02/02/2019 showed her creatinine 1.75 -She has a follow-up appointment with her nephrologist on 03/17/2019. 

## 2019-03-03 NOTE — Patient Instructions (Signed)
[facility] at [facility] Discharge Instructions  Follow up in 4 months with labs    Thank you for choosing [facility] at [facility] to provide your oncology and hematology care.  To afford each patient quality time with our provider, please arrive at least 15 minutes before your scheduled appointment time.   If you have a lab appointment with the Cancer Center please come in thru the Main Entrance and check in at the main information desk.  You need to re-schedule your appointment should you arrive 10 or more minutes late.  We strive to give you quality time with our providers, and arriving late affects you and other patients whose appointments are after yours.  Also, if you no show three or more times for appointments you may be dismissed from the clinic at the providers discretion.     Again, thank you for choosing [facility].  Our hope is that these requests will decrease the amount of time that you wait before being seen by our physicians.       _____________________________________________________________  Should you have questions after your visit to [facility], please contact our office at [phone] between the hours of 8:00 a.m. and 4:30 p.m.  Voicemails left after 4:00 p.m. will not be returned until the following business day.  For prescription refill requests, have your pharmacy contact our office and allow 72 hours.    Due to Covid, you will need to wear a mask upon entering the hospital. If you do not have a mask, a mask will be given to you at the Main Entrance upon arrival. For doctor visits, patients may have 1 support person with them. For treatment visits, patients can not have anyone with them due to social distancing guidelines and our immunocompromised population.      

## 2019-03-06 DIAGNOSIS — G4733 Obstructive sleep apnea (adult) (pediatric): Secondary | ICD-10-CM | POA: Diagnosis not present

## 2019-03-06 DIAGNOSIS — I428 Other cardiomyopathies: Secondary | ICD-10-CM | POA: Diagnosis not present

## 2019-03-06 DIAGNOSIS — R0600 Dyspnea, unspecified: Secondary | ICD-10-CM | POA: Diagnosis not present

## 2019-03-06 DIAGNOSIS — I25118 Atherosclerotic heart disease of native coronary artery with other forms of angina pectoris: Secondary | ICD-10-CM | POA: Diagnosis not present

## 2019-03-06 DIAGNOSIS — I5020 Unspecified systolic (congestive) heart failure: Secondary | ICD-10-CM | POA: Diagnosis not present

## 2019-03-07 DIAGNOSIS — M9903 Segmental and somatic dysfunction of lumbar region: Secondary | ICD-10-CM | POA: Diagnosis not present

## 2019-03-07 DIAGNOSIS — M531 Cervicobrachial syndrome: Secondary | ICD-10-CM | POA: Diagnosis not present

## 2019-03-07 DIAGNOSIS — M9902 Segmental and somatic dysfunction of thoracic region: Secondary | ICD-10-CM | POA: Diagnosis not present

## 2019-03-07 DIAGNOSIS — M9901 Segmental and somatic dysfunction of cervical region: Secondary | ICD-10-CM | POA: Diagnosis not present

## 2019-03-07 DIAGNOSIS — S338XXA Sprain of other parts of lumbar spine and pelvis, initial encounter: Secondary | ICD-10-CM | POA: Diagnosis not present

## 2019-03-07 DIAGNOSIS — S233XXA Sprain of ligaments of thoracic spine, initial encounter: Secondary | ICD-10-CM | POA: Diagnosis not present

## 2019-03-08 ENCOUNTER — Encounter (INDEPENDENT_AMBULATORY_CARE_PROVIDER_SITE_OTHER): Payer: Medicare Other | Admitting: Ophthalmology

## 2019-03-08 DIAGNOSIS — H43813 Vitreous degeneration, bilateral: Secondary | ICD-10-CM | POA: Diagnosis not present

## 2019-03-08 DIAGNOSIS — H35033 Hypertensive retinopathy, bilateral: Secondary | ICD-10-CM

## 2019-03-08 DIAGNOSIS — E113313 Type 2 diabetes mellitus with moderate nonproliferative diabetic retinopathy with macular edema, bilateral: Secondary | ICD-10-CM | POA: Diagnosis not present

## 2019-03-08 DIAGNOSIS — I10 Essential (primary) hypertension: Secondary | ICD-10-CM | POA: Diagnosis not present

## 2019-03-08 DIAGNOSIS — E11311 Type 2 diabetes mellitus with unspecified diabetic retinopathy with macular edema: Secondary | ICD-10-CM | POA: Diagnosis not present

## 2019-03-15 DIAGNOSIS — I5020 Unspecified systolic (congestive) heart failure: Secondary | ICD-10-CM | POA: Diagnosis not present

## 2019-03-15 DIAGNOSIS — I25118 Atherosclerotic heart disease of native coronary artery with other forms of angina pectoris: Secondary | ICD-10-CM | POA: Diagnosis not present

## 2019-03-15 DIAGNOSIS — R0600 Dyspnea, unspecified: Secondary | ICD-10-CM | POA: Diagnosis not present

## 2019-03-15 DIAGNOSIS — I428 Other cardiomyopathies: Secondary | ICD-10-CM | POA: Diagnosis not present

## 2019-03-16 DIAGNOSIS — E559 Vitamin D deficiency, unspecified: Secondary | ICD-10-CM | POA: Diagnosis not present

## 2019-03-16 DIAGNOSIS — E1129 Type 2 diabetes mellitus with other diabetic kidney complication: Secondary | ICD-10-CM | POA: Diagnosis not present

## 2019-03-16 DIAGNOSIS — E211 Secondary hyperparathyroidism, not elsewhere classified: Secondary | ICD-10-CM | POA: Diagnosis not present

## 2019-03-16 DIAGNOSIS — D631 Anemia in chronic kidney disease: Secondary | ICD-10-CM | POA: Diagnosis not present

## 2019-03-16 DIAGNOSIS — N189 Chronic kidney disease, unspecified: Secondary | ICD-10-CM | POA: Diagnosis not present

## 2019-03-16 DIAGNOSIS — E1122 Type 2 diabetes mellitus with diabetic chronic kidney disease: Secondary | ICD-10-CM | POA: Diagnosis not present

## 2019-03-16 DIAGNOSIS — E875 Hyperkalemia: Secondary | ICD-10-CM | POA: Diagnosis not present

## 2019-03-16 DIAGNOSIS — D472 Monoclonal gammopathy: Secondary | ICD-10-CM | POA: Diagnosis not present

## 2019-03-16 DIAGNOSIS — I5020 Unspecified systolic (congestive) heart failure: Secondary | ICD-10-CM | POA: Diagnosis not present

## 2019-03-16 DIAGNOSIS — R809 Proteinuria, unspecified: Secondary | ICD-10-CM | POA: Diagnosis not present

## 2019-03-16 DIAGNOSIS — I428 Other cardiomyopathies: Secondary | ICD-10-CM | POA: Diagnosis not present

## 2019-03-17 DIAGNOSIS — E1129 Type 2 diabetes mellitus with other diabetic kidney complication: Secondary | ICD-10-CM | POA: Diagnosis not present

## 2019-03-17 DIAGNOSIS — E119 Type 2 diabetes mellitus without complications: Secondary | ICD-10-CM | POA: Diagnosis not present

## 2019-03-17 DIAGNOSIS — E211 Secondary hyperparathyroidism, not elsewhere classified: Secondary | ICD-10-CM | POA: Diagnosis not present

## 2019-03-17 DIAGNOSIS — N189 Chronic kidney disease, unspecified: Secondary | ICD-10-CM | POA: Diagnosis not present

## 2019-03-17 DIAGNOSIS — I5032 Chronic diastolic (congestive) heart failure: Secondary | ICD-10-CM | POA: Diagnosis not present

## 2019-03-17 DIAGNOSIS — E875 Hyperkalemia: Secondary | ICD-10-CM | POA: Diagnosis not present

## 2019-03-17 DIAGNOSIS — Z79899 Other long term (current) drug therapy: Secondary | ICD-10-CM | POA: Diagnosis not present

## 2019-03-17 DIAGNOSIS — E1122 Type 2 diabetes mellitus with diabetic chronic kidney disease: Secondary | ICD-10-CM | POA: Diagnosis not present

## 2019-03-17 DIAGNOSIS — R809 Proteinuria, unspecified: Secondary | ICD-10-CM | POA: Diagnosis not present

## 2019-03-17 DIAGNOSIS — E7800 Pure hypercholesterolemia, unspecified: Secondary | ICD-10-CM | POA: Diagnosis not present

## 2019-03-17 DIAGNOSIS — D472 Monoclonal gammopathy: Secondary | ICD-10-CM | POA: Diagnosis not present

## 2019-03-17 DIAGNOSIS — D631 Anemia in chronic kidney disease: Secondary | ICD-10-CM | POA: Diagnosis not present

## 2019-03-17 DIAGNOSIS — I2510 Atherosclerotic heart disease of native coronary artery without angina pectoris: Secondary | ICD-10-CM | POA: Diagnosis not present

## 2019-03-17 DIAGNOSIS — I10 Essential (primary) hypertension: Secondary | ICD-10-CM | POA: Diagnosis not present

## 2019-03-19 DIAGNOSIS — I10 Essential (primary) hypertension: Secondary | ICD-10-CM | POA: Diagnosis not present

## 2019-03-20 DIAGNOSIS — R809 Proteinuria, unspecified: Secondary | ICD-10-CM | POA: Diagnosis not present

## 2019-03-20 DIAGNOSIS — Z6841 Body Mass Index (BMI) 40.0 and over, adult: Secondary | ICD-10-CM | POA: Diagnosis not present

## 2019-03-20 DIAGNOSIS — E1165 Type 2 diabetes mellitus with hyperglycemia: Secondary | ICD-10-CM | POA: Diagnosis not present

## 2019-03-20 DIAGNOSIS — Z299 Encounter for prophylactic measures, unspecified: Secondary | ICD-10-CM | POA: Diagnosis not present

## 2019-03-20 DIAGNOSIS — I10 Essential (primary) hypertension: Secondary | ICD-10-CM | POA: Diagnosis not present

## 2019-03-20 DIAGNOSIS — I5032 Chronic diastolic (congestive) heart failure: Secondary | ICD-10-CM | POA: Diagnosis not present

## 2019-03-20 DIAGNOSIS — Z87891 Personal history of nicotine dependence: Secondary | ICD-10-CM | POA: Diagnosis not present

## 2019-03-20 DIAGNOSIS — E1129 Type 2 diabetes mellitus with other diabetic kidney complication: Secondary | ICD-10-CM | POA: Diagnosis not present

## 2019-03-24 DIAGNOSIS — I10 Essential (primary) hypertension: Secondary | ICD-10-CM | POA: Diagnosis not present

## 2019-03-24 DIAGNOSIS — E7800 Pure hypercholesterolemia, unspecified: Secondary | ICD-10-CM | POA: Diagnosis not present

## 2019-03-24 DIAGNOSIS — I2510 Atherosclerotic heart disease of native coronary artery without angina pectoris: Secondary | ICD-10-CM | POA: Diagnosis not present

## 2019-03-24 DIAGNOSIS — E119 Type 2 diabetes mellitus without complications: Secondary | ICD-10-CM | POA: Diagnosis not present

## 2019-03-28 DIAGNOSIS — M531 Cervicobrachial syndrome: Secondary | ICD-10-CM | POA: Diagnosis not present

## 2019-03-28 DIAGNOSIS — Z79899 Other long term (current) drug therapy: Secondary | ICD-10-CM | POA: Diagnosis not present

## 2019-03-28 DIAGNOSIS — S233XXA Sprain of ligaments of thoracic spine, initial encounter: Secondary | ICD-10-CM | POA: Diagnosis not present

## 2019-03-28 DIAGNOSIS — M9902 Segmental and somatic dysfunction of thoracic region: Secondary | ICD-10-CM | POA: Diagnosis not present

## 2019-03-28 DIAGNOSIS — M9901 Segmental and somatic dysfunction of cervical region: Secondary | ICD-10-CM | POA: Diagnosis not present

## 2019-03-28 DIAGNOSIS — R809 Proteinuria, unspecified: Secondary | ICD-10-CM | POA: Diagnosis not present

## 2019-03-28 DIAGNOSIS — E211 Secondary hyperparathyroidism, not elsewhere classified: Secondary | ICD-10-CM | POA: Diagnosis not present

## 2019-03-28 DIAGNOSIS — S338XXA Sprain of other parts of lumbar spine and pelvis, initial encounter: Secondary | ICD-10-CM | POA: Diagnosis not present

## 2019-03-28 DIAGNOSIS — E875 Hyperkalemia: Secondary | ICD-10-CM | POA: Diagnosis not present

## 2019-03-28 DIAGNOSIS — N189 Chronic kidney disease, unspecified: Secondary | ICD-10-CM | POA: Diagnosis not present

## 2019-03-28 DIAGNOSIS — E1122 Type 2 diabetes mellitus with diabetic chronic kidney disease: Secondary | ICD-10-CM | POA: Diagnosis not present

## 2019-03-28 DIAGNOSIS — I5032 Chronic diastolic (congestive) heart failure: Secondary | ICD-10-CM | POA: Diagnosis not present

## 2019-03-28 DIAGNOSIS — E1129 Type 2 diabetes mellitus with other diabetic kidney complication: Secondary | ICD-10-CM | POA: Diagnosis not present

## 2019-03-28 DIAGNOSIS — D631 Anemia in chronic kidney disease: Secondary | ICD-10-CM | POA: Diagnosis not present

## 2019-03-28 DIAGNOSIS — D472 Monoclonal gammopathy: Secondary | ICD-10-CM | POA: Diagnosis not present

## 2019-03-28 DIAGNOSIS — M9903 Segmental and somatic dysfunction of lumbar region: Secondary | ICD-10-CM | POA: Diagnosis not present

## 2019-03-29 DIAGNOSIS — Z23 Encounter for immunization: Secondary | ICD-10-CM | POA: Diagnosis not present

## 2019-03-30 DIAGNOSIS — I110 Hypertensive heart disease with heart failure: Secondary | ICD-10-CM | POA: Diagnosis not present

## 2019-03-30 DIAGNOSIS — R0600 Dyspnea, unspecified: Secondary | ICD-10-CM | POA: Diagnosis not present

## 2019-03-30 DIAGNOSIS — G4733 Obstructive sleep apnea (adult) (pediatric): Secondary | ICD-10-CM | POA: Diagnosis not present

## 2019-03-30 DIAGNOSIS — I428 Other cardiomyopathies: Secondary | ICD-10-CM | POA: Diagnosis not present

## 2019-04-04 DIAGNOSIS — Z299 Encounter for prophylactic measures, unspecified: Secondary | ICD-10-CM | POA: Diagnosis not present

## 2019-04-04 DIAGNOSIS — Z0000 Encounter for general adult medical examination without abnormal findings: Secondary | ICD-10-CM | POA: Diagnosis not present

## 2019-04-04 DIAGNOSIS — Z6841 Body Mass Index (BMI) 40.0 and over, adult: Secondary | ICD-10-CM | POA: Diagnosis not present

## 2019-04-04 DIAGNOSIS — Z1331 Encounter for screening for depression: Secondary | ICD-10-CM | POA: Diagnosis not present

## 2019-04-04 DIAGNOSIS — E7800 Pure hypercholesterolemia, unspecified: Secondary | ICD-10-CM | POA: Diagnosis not present

## 2019-04-04 DIAGNOSIS — Z1211 Encounter for screening for malignant neoplasm of colon: Secondary | ICD-10-CM | POA: Diagnosis not present

## 2019-04-04 DIAGNOSIS — Z7189 Other specified counseling: Secondary | ICD-10-CM | POA: Diagnosis not present

## 2019-04-04 DIAGNOSIS — Z1339 Encounter for screening examination for other mental health and behavioral disorders: Secondary | ICD-10-CM | POA: Diagnosis not present

## 2019-04-04 DIAGNOSIS — I5032 Chronic diastolic (congestive) heart failure: Secondary | ICD-10-CM | POA: Diagnosis not present

## 2019-04-11 DIAGNOSIS — M9902 Segmental and somatic dysfunction of thoracic region: Secondary | ICD-10-CM | POA: Diagnosis not present

## 2019-04-11 DIAGNOSIS — M9903 Segmental and somatic dysfunction of lumbar region: Secondary | ICD-10-CM | POA: Diagnosis not present

## 2019-04-11 DIAGNOSIS — M531 Cervicobrachial syndrome: Secondary | ICD-10-CM | POA: Diagnosis not present

## 2019-04-11 DIAGNOSIS — S338XXA Sprain of other parts of lumbar spine and pelvis, initial encounter: Secondary | ICD-10-CM | POA: Diagnosis not present

## 2019-04-11 DIAGNOSIS — M9901 Segmental and somatic dysfunction of cervical region: Secondary | ICD-10-CM | POA: Diagnosis not present

## 2019-04-11 DIAGNOSIS — S233XXA Sprain of ligaments of thoracic spine, initial encounter: Secondary | ICD-10-CM | POA: Diagnosis not present

## 2019-04-18 DIAGNOSIS — F411 Generalized anxiety disorder: Secondary | ICD-10-CM | POA: Diagnosis not present

## 2019-04-18 DIAGNOSIS — F4310 Post-traumatic stress disorder, unspecified: Secondary | ICD-10-CM | POA: Diagnosis not present

## 2019-04-18 DIAGNOSIS — F3112 Bipolar disorder, current episode manic without psychotic features, moderate: Secondary | ICD-10-CM | POA: Diagnosis not present

## 2019-04-18 DIAGNOSIS — I10 Essential (primary) hypertension: Secondary | ICD-10-CM | POA: Diagnosis not present

## 2019-04-19 ENCOUNTER — Other Ambulatory Visit: Payer: Self-pay | Admitting: Cardiology

## 2019-04-19 DIAGNOSIS — Z23 Encounter for immunization: Secondary | ICD-10-CM | POA: Diagnosis not present

## 2019-04-20 DIAGNOSIS — I10 Essential (primary) hypertension: Secondary | ICD-10-CM | POA: Diagnosis not present

## 2019-04-20 DIAGNOSIS — E7800 Pure hypercholesterolemia, unspecified: Secondary | ICD-10-CM | POA: Diagnosis not present

## 2019-04-20 DIAGNOSIS — I2510 Atherosclerotic heart disease of native coronary artery without angina pectoris: Secondary | ICD-10-CM | POA: Diagnosis not present

## 2019-04-20 DIAGNOSIS — E119 Type 2 diabetes mellitus without complications: Secondary | ICD-10-CM | POA: Diagnosis not present

## 2019-04-21 DIAGNOSIS — E1129 Type 2 diabetes mellitus with other diabetic kidney complication: Secondary | ICD-10-CM | POA: Diagnosis not present

## 2019-04-21 DIAGNOSIS — D472 Monoclonal gammopathy: Secondary | ICD-10-CM | POA: Diagnosis not present

## 2019-04-21 DIAGNOSIS — E875 Hyperkalemia: Secondary | ICD-10-CM | POA: Diagnosis not present

## 2019-04-21 DIAGNOSIS — E1122 Type 2 diabetes mellitus with diabetic chronic kidney disease: Secondary | ICD-10-CM | POA: Diagnosis not present

## 2019-04-21 DIAGNOSIS — R809 Proteinuria, unspecified: Secondary | ICD-10-CM | POA: Diagnosis not present

## 2019-04-21 DIAGNOSIS — E211 Secondary hyperparathyroidism, not elsewhere classified: Secondary | ICD-10-CM | POA: Diagnosis not present

## 2019-04-21 DIAGNOSIS — D631 Anemia in chronic kidney disease: Secondary | ICD-10-CM | POA: Diagnosis not present

## 2019-04-21 DIAGNOSIS — N189 Chronic kidney disease, unspecified: Secondary | ICD-10-CM | POA: Diagnosis not present

## 2019-04-21 DIAGNOSIS — I5032 Chronic diastolic (congestive) heart failure: Secondary | ICD-10-CM | POA: Diagnosis not present

## 2019-04-21 DIAGNOSIS — E873 Alkalosis: Secondary | ICD-10-CM | POA: Diagnosis not present

## 2019-04-25 DIAGNOSIS — S338XXA Sprain of other parts of lumbar spine and pelvis, initial encounter: Secondary | ICD-10-CM | POA: Diagnosis not present

## 2019-04-25 DIAGNOSIS — M9902 Segmental and somatic dysfunction of thoracic region: Secondary | ICD-10-CM | POA: Diagnosis not present

## 2019-04-25 DIAGNOSIS — M531 Cervicobrachial syndrome: Secondary | ICD-10-CM | POA: Diagnosis not present

## 2019-04-25 DIAGNOSIS — S233XXA Sprain of ligaments of thoracic spine, initial encounter: Secondary | ICD-10-CM | POA: Diagnosis not present

## 2019-04-25 DIAGNOSIS — M9901 Segmental and somatic dysfunction of cervical region: Secondary | ICD-10-CM | POA: Diagnosis not present

## 2019-04-25 DIAGNOSIS — M9903 Segmental and somatic dysfunction of lumbar region: Secondary | ICD-10-CM | POA: Diagnosis not present

## 2019-04-26 DIAGNOSIS — I428 Other cardiomyopathies: Secondary | ICD-10-CM | POA: Diagnosis not present

## 2019-05-08 DIAGNOSIS — M9903 Segmental and somatic dysfunction of lumbar region: Secondary | ICD-10-CM | POA: Diagnosis not present

## 2019-05-08 DIAGNOSIS — M9901 Segmental and somatic dysfunction of cervical region: Secondary | ICD-10-CM | POA: Diagnosis not present

## 2019-05-08 DIAGNOSIS — M531 Cervicobrachial syndrome: Secondary | ICD-10-CM | POA: Diagnosis not present

## 2019-05-08 DIAGNOSIS — S233XXA Sprain of ligaments of thoracic spine, initial encounter: Secondary | ICD-10-CM | POA: Diagnosis not present

## 2019-05-08 DIAGNOSIS — S338XXA Sprain of other parts of lumbar spine and pelvis, initial encounter: Secondary | ICD-10-CM | POA: Diagnosis not present

## 2019-05-08 DIAGNOSIS — M9902 Segmental and somatic dysfunction of thoracic region: Secondary | ICD-10-CM | POA: Diagnosis not present

## 2019-05-19 DIAGNOSIS — I10 Essential (primary) hypertension: Secondary | ICD-10-CM | POA: Diagnosis not present

## 2019-05-22 DIAGNOSIS — I10 Essential (primary) hypertension: Secondary | ICD-10-CM | POA: Diagnosis not present

## 2019-05-22 DIAGNOSIS — Z299 Encounter for prophylactic measures, unspecified: Secondary | ICD-10-CM | POA: Diagnosis not present

## 2019-05-22 DIAGNOSIS — E1165 Type 2 diabetes mellitus with hyperglycemia: Secondary | ICD-10-CM | POA: Diagnosis not present

## 2019-05-22 DIAGNOSIS — I509 Heart failure, unspecified: Secondary | ICD-10-CM | POA: Diagnosis not present

## 2019-05-22 DIAGNOSIS — E1122 Type 2 diabetes mellitus with diabetic chronic kidney disease: Secondary | ICD-10-CM | POA: Diagnosis not present

## 2019-05-23 DIAGNOSIS — L110 Acquired keratosis follicularis: Secondary | ICD-10-CM | POA: Diagnosis not present

## 2019-05-23 DIAGNOSIS — E1140 Type 2 diabetes mellitus with diabetic neuropathy, unspecified: Secondary | ICD-10-CM | POA: Diagnosis not present

## 2019-05-23 DIAGNOSIS — L609 Nail disorder, unspecified: Secondary | ICD-10-CM | POA: Diagnosis not present

## 2019-06-05 DIAGNOSIS — S338XXA Sprain of other parts of lumbar spine and pelvis, initial encounter: Secondary | ICD-10-CM | POA: Diagnosis not present

## 2019-06-05 DIAGNOSIS — M9902 Segmental and somatic dysfunction of thoracic region: Secondary | ICD-10-CM | POA: Diagnosis not present

## 2019-06-05 DIAGNOSIS — M531 Cervicobrachial syndrome: Secondary | ICD-10-CM | POA: Diagnosis not present

## 2019-06-05 DIAGNOSIS — S233XXA Sprain of ligaments of thoracic spine, initial encounter: Secondary | ICD-10-CM | POA: Diagnosis not present

## 2019-06-05 DIAGNOSIS — M9903 Segmental and somatic dysfunction of lumbar region: Secondary | ICD-10-CM | POA: Diagnosis not present

## 2019-06-05 DIAGNOSIS — M9901 Segmental and somatic dysfunction of cervical region: Secondary | ICD-10-CM | POA: Diagnosis not present

## 2019-06-18 DIAGNOSIS — E119 Type 2 diabetes mellitus without complications: Secondary | ICD-10-CM | POA: Diagnosis not present

## 2019-06-18 DIAGNOSIS — E7800 Pure hypercholesterolemia, unspecified: Secondary | ICD-10-CM | POA: Diagnosis not present

## 2019-06-18 DIAGNOSIS — I10 Essential (primary) hypertension: Secondary | ICD-10-CM | POA: Diagnosis not present

## 2019-06-18 DIAGNOSIS — I2510 Atherosclerotic heart disease of native coronary artery without angina pectoris: Secondary | ICD-10-CM | POA: Diagnosis not present

## 2019-06-20 DIAGNOSIS — M9902 Segmental and somatic dysfunction of thoracic region: Secondary | ICD-10-CM | POA: Diagnosis not present

## 2019-06-20 DIAGNOSIS — M9903 Segmental and somatic dysfunction of lumbar region: Secondary | ICD-10-CM | POA: Diagnosis not present

## 2019-06-20 DIAGNOSIS — M531 Cervicobrachial syndrome: Secondary | ICD-10-CM | POA: Diagnosis not present

## 2019-06-20 DIAGNOSIS — S233XXA Sprain of ligaments of thoracic spine, initial encounter: Secondary | ICD-10-CM | POA: Diagnosis not present

## 2019-06-20 DIAGNOSIS — M9901 Segmental and somatic dysfunction of cervical region: Secondary | ICD-10-CM | POA: Diagnosis not present

## 2019-06-20 DIAGNOSIS — S338XXA Sprain of other parts of lumbar spine and pelvis, initial encounter: Secondary | ICD-10-CM | POA: Diagnosis not present

## 2019-06-21 DIAGNOSIS — Z0000 Encounter for general adult medical examination without abnormal findings: Secondary | ICD-10-CM | POA: Diagnosis not present

## 2019-06-23 ENCOUNTER — Inpatient Hospital Stay (HOSPITAL_COMMUNITY): Payer: Medicare Other | Attending: Hematology

## 2019-06-23 ENCOUNTER — Other Ambulatory Visit: Payer: Self-pay

## 2019-06-23 DIAGNOSIS — Z87891 Personal history of nicotine dependence: Secondary | ICD-10-CM | POA: Insufficient documentation

## 2019-06-23 DIAGNOSIS — Z794 Long term (current) use of insulin: Secondary | ICD-10-CM | POA: Diagnosis not present

## 2019-06-23 DIAGNOSIS — Z79899 Other long term (current) drug therapy: Secondary | ICD-10-CM | POA: Insufficient documentation

## 2019-06-23 DIAGNOSIS — D472 Monoclonal gammopathy: Secondary | ICD-10-CM | POA: Diagnosis present

## 2019-06-23 DIAGNOSIS — E1122 Type 2 diabetes mellitus with diabetic chronic kidney disease: Secondary | ICD-10-CM | POA: Insufficient documentation

## 2019-06-23 DIAGNOSIS — E782 Mixed hyperlipidemia: Secondary | ICD-10-CM | POA: Diagnosis not present

## 2019-06-23 DIAGNOSIS — I129 Hypertensive chronic kidney disease with stage 1 through stage 4 chronic kidney disease, or unspecified chronic kidney disease: Secondary | ICD-10-CM | POA: Diagnosis not present

## 2019-06-23 DIAGNOSIS — D649 Anemia, unspecified: Secondary | ICD-10-CM | POA: Diagnosis not present

## 2019-06-23 DIAGNOSIS — F319 Bipolar disorder, unspecified: Secondary | ICD-10-CM | POA: Insufficient documentation

## 2019-06-23 DIAGNOSIS — N184 Chronic kidney disease, stage 4 (severe): Secondary | ICD-10-CM | POA: Diagnosis present

## 2019-06-23 LAB — COMPREHENSIVE METABOLIC PANEL
ALT: 14 U/L (ref 0–44)
AST: 18 U/L (ref 15–41)
Albumin: 3.7 g/dL (ref 3.5–5.0)
Alkaline Phosphatase: 47 U/L (ref 38–126)
Anion gap: 13 (ref 5–15)
BUN: 74 mg/dL — ABNORMAL HIGH (ref 8–23)
CO2: 27 mmol/L (ref 22–32)
Calcium: 8.6 mg/dL — ABNORMAL LOW (ref 8.9–10.3)
Chloride: 97 mmol/L — ABNORMAL LOW (ref 98–111)
Creatinine, Ser: 2.07 mg/dL — ABNORMAL HIGH (ref 0.44–1.00)
GFR calc Af Amer: 28 mL/min — ABNORMAL LOW (ref >60)
GFR calc non Af Amer: 24 mL/min — ABNORMAL LOW (ref >60)
Glucose, Bld: 256 mg/dL — ABNORMAL HIGH (ref 70–99)
Potassium: 4.5 mmol/L (ref 3.5–5.1)
Sodium: 137 mmol/L (ref 135–145)
Total Bilirubin: 0.5 mg/dL (ref 0.3–1.2)
Total Protein: 6.8 g/dL (ref 6.5–8.1)

## 2019-06-23 LAB — LACTATE DEHYDROGENASE: LDH: 178 U/L (ref 98–192)

## 2019-06-23 LAB — CBC WITH DIFFERENTIAL/PLATELET
Abs Immature Granulocytes: 0.02 10*3/uL (ref 0.00–0.07)
Basophils Absolute: 0 10*3/uL (ref 0.0–0.1)
Basophils Relative: 1 %
Eosinophils Absolute: 0.7 10*3/uL — ABNORMAL HIGH (ref 0.0–0.5)
Eosinophils Relative: 10 %
HCT: 34.6 % — ABNORMAL LOW (ref 36.0–46.0)
Hemoglobin: 10.8 g/dL — ABNORMAL LOW (ref 12.0–15.0)
Immature Granulocytes: 0 %
Lymphocytes Relative: 21 %
Lymphs Abs: 1.3 10*3/uL (ref 0.7–4.0)
MCH: 30.9 pg (ref 26.0–34.0)
MCHC: 31.2 g/dL (ref 30.0–36.0)
MCV: 99.1 fL (ref 80.0–100.0)
Monocytes Absolute: 0.4 10*3/uL (ref 0.1–1.0)
Monocytes Relative: 7 %
Neutro Abs: 3.8 10*3/uL (ref 1.7–7.7)
Neutrophils Relative %: 61 %
Platelets: 168 10*3/uL (ref 150–400)
RBC: 3.49 MIL/uL — ABNORMAL LOW (ref 3.87–5.11)
RDW: 14.1 % (ref 11.5–15.5)
WBC: 6.2 10*3/uL (ref 4.0–10.5)
nRBC: 0 % (ref 0.0–0.2)

## 2019-06-23 LAB — IRON AND TIBC
Iron: 65 ug/dL (ref 28–170)
Saturation Ratios: 25 % (ref 10.4–31.8)
TIBC: 263 ug/dL (ref 250–450)
UIBC: 198 ug/dL

## 2019-06-23 LAB — FERRITIN: Ferritin: 79 ng/mL (ref 11–307)

## 2019-06-23 LAB — VITAMIN D 25 HYDROXY (VIT D DEFICIENCY, FRACTURES): Vit D, 25-Hydroxy: 59.65 ng/mL (ref 30–100)

## 2019-06-23 LAB — FOLATE: Folate: 43.2 ng/mL (ref >5.9)

## 2019-06-23 LAB — TSH: TSH: 1.974 u[IU]/mL (ref 0.350–4.500)

## 2019-06-23 LAB — VITAMIN B12: Vitamin B-12: 1161 pg/mL — ABNORMAL HIGH (ref 180–914)

## 2019-06-26 LAB — PROTEIN ELECTROPHORESIS, SERUM
A/G Ratio: 1.1 (ref 0.7–1.7)
Albumin ELP: 3.5 g/dL (ref 2.9–4.4)
Alpha-1-Globulin: 0.2 g/dL (ref 0.0–0.4)
Alpha-2-Globulin: 0.6 g/dL (ref 0.4–1.0)
Beta Globulin: 0.9 g/dL (ref 0.7–1.3)
Gamma Globulin: 1.4 g/dL (ref 0.4–1.8)
Globulin, Total: 3.1 g/dL (ref 2.2–3.9)
M-Spike, %: 0.8 g/dL — ABNORMAL HIGH
Total Protein ELP: 6.6 g/dL (ref 6.0–8.5)

## 2019-06-26 LAB — KAPPA/LAMBDA LIGHT CHAINS
Kappa free light chain: 85.9 mg/L — ABNORMAL HIGH (ref 3.3–19.4)
Kappa, lambda light chain ratio: 1.5 (ref 0.26–1.65)
Lambda free light chains: 57.4 mg/L — ABNORMAL HIGH (ref 5.7–26.3)

## 2019-06-28 DIAGNOSIS — E1122 Type 2 diabetes mellitus with diabetic chronic kidney disease: Secondary | ICD-10-CM | POA: Diagnosis not present

## 2019-06-28 DIAGNOSIS — I5032 Chronic diastolic (congestive) heart failure: Secondary | ICD-10-CM | POA: Diagnosis not present

## 2019-06-28 DIAGNOSIS — R809 Proteinuria, unspecified: Secondary | ICD-10-CM | POA: Diagnosis not present

## 2019-06-28 DIAGNOSIS — N189 Chronic kidney disease, unspecified: Secondary | ICD-10-CM | POA: Diagnosis not present

## 2019-06-28 DIAGNOSIS — N170 Acute kidney failure with tubular necrosis: Secondary | ICD-10-CM | POA: Diagnosis not present

## 2019-06-28 DIAGNOSIS — E211 Secondary hyperparathyroidism, not elsewhere classified: Secondary | ICD-10-CM | POA: Diagnosis not present

## 2019-06-28 DIAGNOSIS — E1129 Type 2 diabetes mellitus with other diabetic kidney complication: Secondary | ICD-10-CM | POA: Diagnosis not present

## 2019-06-28 DIAGNOSIS — D631 Anemia in chronic kidney disease: Secondary | ICD-10-CM | POA: Diagnosis not present

## 2019-06-29 DIAGNOSIS — G4733 Obstructive sleep apnea (adult) (pediatric): Secondary | ICD-10-CM | POA: Diagnosis not present

## 2019-06-29 DIAGNOSIS — R0600 Dyspnea, unspecified: Secondary | ICD-10-CM | POA: Diagnosis not present

## 2019-06-29 DIAGNOSIS — I25118 Atherosclerotic heart disease of native coronary artery with other forms of angina pectoris: Secondary | ICD-10-CM | POA: Diagnosis not present

## 2019-06-29 DIAGNOSIS — I110 Hypertensive heart disease with heart failure: Secondary | ICD-10-CM | POA: Diagnosis not present

## 2019-06-29 DIAGNOSIS — I428 Other cardiomyopathies: Secondary | ICD-10-CM | POA: Diagnosis not present

## 2019-06-30 ENCOUNTER — Other Ambulatory Visit: Payer: Self-pay

## 2019-06-30 ENCOUNTER — Inpatient Hospital Stay (HOSPITAL_BASED_OUTPATIENT_CLINIC_OR_DEPARTMENT_OTHER): Payer: Medicare Other | Admitting: Nurse Practitioner

## 2019-06-30 DIAGNOSIS — D472 Monoclonal gammopathy: Secondary | ICD-10-CM

## 2019-06-30 DIAGNOSIS — N184 Chronic kidney disease, stage 4 (severe): Secondary | ICD-10-CM | POA: Diagnosis not present

## 2019-06-30 NOTE — Progress Notes (Signed)
Christy [name], [place] [address]   CLINIC:  Medical Oncology/Hematology  PCP:  Monico Blitz, [place] [address] [phone]   REASON FOR VISIT: Follow-up for MGUS   CURRENT THERAPY: Observation   INTERVAL HISTORY:  Christy [name] 69 y.o. female returns for routine follow-up for MGUS.  Patient reports she is doing well since her last visit.  She denies any bright red bleeding per rectum or melena.  She denies any easy bruising or bleeding.  She denies any new bone pain. Denies any nausea, vomiting, or diarrhea. Denies any new pains. Had not noticed any recent bleeding such as epistaxis, hematuria or hematochezia. Denies recent chest pain on exertion, shortness of breath on minimal exertion, pre-syncopal episodes, or palpitations. Denies any numbness or tingling in hands or feet. Denies any recent fevers, infections, or recent hospitalizations. Patient reports appetite at 50% and energy level at 50%.  She is eating well maintain her weight this time.     REVIEW OF SYSTEMS:  Review of Systems  Respiratory: Positive for cough and shortness of breath.   Gastrointestinal: Positive for constipation, diarrhea and nausea.  All other systems reviewed and are negative.    PAST MEDICAL/SURGICAL HISTORY:  Past Medical History:  Diagnosis Date  . Bipolar disorder ([place])   . Coronary atherosclerosis of native coronary artery    BMS circ 2006, 70% PDA 2009  . Essential hypertension   . Mixed hyperlipidemia   . OSA on CPAP   . Renal tubular acidosis, type IV   . SVT (supraventricular tachycardia) ([place])   . Type 2 diabetes mellitus ([place])    Past Surgical History:  Procedure Laterality Date  . Acromioclavicular arthritis    . CARDIAC CATHETERIZATION N/A 03/29/2015   Procedure: Left Heart Cath and Coronary Angiography;  Surgeon: Troy Sine, MD;  Location: [facility];  Service: Cardiovascular;  Laterality: N/A;  . RIght shoulder  adhesive capsulitis    . Rotator cuff impingement syndrome    . Rotator cuff tear       SOCIAL HISTORY:  Social History   Socioeconomic History  . Marital status: Divorced    Spouse name: Not on file  . Number of children: Not on file  . Years of education: Not on file  . Highest education level: Not on file  Occupational History  . Occupation: PLANNING AND INSPECTOR    Employer: [employer]  Tobacco Use  . Smoking status: Former Smoker    Packs/day: 1.00    Years: 20.00    Pack years: 20.00    Types: Cigarettes    Start date: 10/30/1984    Quit date: 07/08/2013    Years since quitting: 5.9  . Smokeless tobacco: Never Used  Substance and Sexual Activity  . Alcohol use: No    Alcohol/week: 0.0 standard drinks  . Drug use: No  . Sexual activity: Not on file  Other Topics Concern  . Not on file  Social History Narrative   She has lived with some of her daughters. Works part-time for the city.    Social Determinants of Health   Financial Resource Strain:   . Difficulty of Paying Living Expenses:   Food Insecurity:   . Worried About Charity fundraiser in the Last Year:   . Arboriculturist in the Last Year:   Transportation Needs:   . Film/video editor (Medical):   Marland Kitchen Lack of Transportation (Non-Medical):  Physical Activity:   . Days of Exercise per Week:   . Minutes of Exercise per Session:   Stress:   . Feeling of Stress :   Social Connections:   . Frequency of Communication with Friends and Family:   . Frequency of Social Gatherings with Friends and Family:   . Attends Religious Services:   . Active Member of Clubs or Organizations:   . Attends Archivist Meetings:   Marland Kitchen Marital Status:   Intimate Partner Violence:   . Fear of Current or Ex-Partner:   . Emotionally Abused:   Marland Kitchen Physically Abused:   . Sexually Abused:     FAMILY HISTORY:  Family History  Problem Relation Age of Onset  . Bipolar disorder Brother   . Diabetes Father      CURRENT MEDICATIONS:  Outpatient Encounter Medications as of 06/30/2019  Medication Sig  . allopurinol (ZYLOPRIM) 300 MG tablet Take 150 mg by mouth daily.  Marland Kitchen amLODipine (NORVASC) 5 MG tablet Take 5 mg by mouth daily.  Marland Kitchen aspirin 81 MG EC tablet Take 81 mg by mouth daily.    Marland Kitchen BESIVANCE 0.6 % SUSP INSTILL ONE DROP IN THE LEFT EYE FOUR TIMES DAILY FOR 2 DAYS AFTER EACH MONTHLY EYE INJECTION  . buPROPion (WELLBUTRIN XL) 300 MG 24 hr tablet Take 300 mg by mouth daily.  . calcium carbonate (OS-CAL) 600 MG TABS Take 600 mg by mouth daily.    . carvedilol (COREG) 12.5 MG tablet Take 12.5 mg by mouth 2 (two) times daily with a meal.  . Cholecalciferol (VITAMIN D3) 25 MCG (1000 UT) CAPS Take 1 capsule by mouth daily.  . divalproex (DEPAKOTE) 250 MG EC tablet Take 250 mg by mouth daily.    . hydrALAZINE (APRESOLINE) 25 MG tablet Take 1 tablet (25 mg total) by mouth 3 (three) times daily.  . insulin aspart (NOVOLOG) 100 UNIT/ML injection Inject 10 Units into the skin daily. 10 units before bedtime meal  . isosorbide mononitrate (IMDUR) 60 MG 24 hr tablet Take 1.5 tablets (90 mg total) by mouth daily.  . Liraglutide (VICTOZA) 18 MG/3ML SOLN Inject 1.2 mg into the skin daily.   . metolazone (ZAROXOLYN) 5 MG tablet Take 5 mg by mouth as needed.  . Multiple Vitamin (MULTIVITAMIN) tablet Take 1 tablet by mouth daily.    . rosuvastatin (CRESTOR) 10 MG tablet Take 10 mg by mouth daily.  . sertraline (ZOLOFT) 100 MG tablet Take 100 mg by mouth daily.    Marland Kitchen torsemide (DEMADEX) 20 MG tablet Take 80 mg by mouth every morning.  . traZODone (DESYREL) 100 MG tablet TAKE ONE TAB BY MOUTH AT BEDTIME  . TRESIBA FLEXTOUCH 200 UNIT/ML SOPN Inject 50 Units into the skin every morning.   . meclizine (ANTIVERT) 25 MG tablet Take 25 mg by mouth as needed.  (Patient not taking: Reported on 06/30/2019)  . nitroGLYCERIN (NITROLINGUAL) 0.4 MG/SPRAY spray Place 1 spray under the tongue every 5 (five) minutes x 3 doses as  needed. (Patient not taking: Reported on 02/02/2019)  . [DISCONTINUED] amLODipine (NORVASC) 10 MG tablet Take 10 mg by mouth daily.  . [DISCONTINUED] labetalol (NORMODYNE) 100 MG tablet Take 1 tablet (100 mg total) by mouth 2 (two) times daily.  . [DISCONTINUED] labetalol (NORMODYNE) 300 MG tablet TAKE 1 TABLET BY MOUTH TWICE DAILY   No facility-administered encounter medications on file as of 06/30/2019.    ALLERGIES:  Allergies  Allergen Reactions  . Ace Inhibitors   . Lisinopril  Severe cough  . Penicillins   . Tetracycline   . Lamictal [Lamotrigine] Rash    Rash start inside to outter body.     PHYSICAL EXAM:  ECOG Performance status: 1  Vitals:   06/30/19 1131  BP: (!) 143/44  Pulse: (!) 59  Resp: 18  Temp: 97.9 F (36.6 C)  SpO2: 96%   Filed Weights   06/30/19 1131  Weight: 223 lb (101.2 kg)   Physical Exam Constitutional:      Appearance: Normal appearance. She is normal weight.  Cardiovascular:     Rate and Rhythm: Normal rate and regular rhythm.     Heart sounds: Normal heart sounds.  Pulmonary:     Effort: Pulmonary effort is normal.     Breath sounds: Normal breath sounds.  Abdominal:     General: Bowel sounds are normal.     Palpations: Abdomen is soft.  Musculoskeletal:        General: Normal range of motion.  Skin:    General: Skin is warm.  Neurological:     Mental Status: She is alert and oriented to person, place, and time. Mental status is at baseline.  Psychiatric:        Mood and Affect: Mood normal.        Behavior: Behavior normal.        Thought Content: Thought content normal.        Judgment: Judgment normal.      LABORATORY DATA:  I have reviewed the labs as listed.  CBC    Component Value Date/Time   WBC 6.2 06/23/2019 1130   RBC 3.49 (L) 06/23/2019 1130   HGB 10.8 (L) 06/23/2019 1130   HCT 34.6 (L) 06/23/2019 1130   PLT 168 06/23/2019 1130   MCV 99.1 06/23/2019 1130   MCH 30.9 06/23/2019 1130   MCHC 31.2  06/23/2019 1130   RDW 14.1 06/23/2019 1130   LYMPHSABS 1.3 06/23/2019 1130   MONOABS 0.4 06/23/2019 1130   EOSABS 0.7 (H) 06/23/2019 1130   BASOSABS 0.0 06/23/2019 1130   CMP Latest Ref Rng & Units 06/23/2019 02/02/2019 11/08/2018  Glucose 70 - 99 mg/dL 256(H) 135(H) 164(H)  BUN 8 - 23 mg/dL 74(H) 42(H) 45(H)  Creatinine 0.44 - 1.00 mg/dL 2.07(H) 1.75(H) 1.90(H)  Sodium 135 - 145 mmol/L 137 141 136  Potassium 3.5 - 5.1 mmol/L 4.5 5.5(H) 5.0  Chloride 98 - 111 mmol/L 97(L) 107 104  CO2 22 - 32 mmol/L _0 Calcium 8.9 - 10.3 mg/dL 8.6(L) 8.7(L) 8.3(L)  Total Protein 6.5 - 8.1 g/dL 6.8 7.0 6.5  Total Bilirubin 0.3 - 1.2 mg/dL 0.5 0.6 0.3  Alkaline Phos 38 - 126 U/L 47 54 50  AST 15 - 41 U/L _1 ALT 0 - 44 U/L _2 All questions were answered to patient's stated satisfaction. Encouraged patient to call with any new concerns or questions before his next visit to the cancer center and we can certain see him sooner, if needed.     ASSESSMENT & PLAN:  MGUS (monoclonal gammopathy of unknown significance) 1.  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  She was noted to have an elevated creatinine and was seen by nephrologist in December 2014.  She had an SPEP done as part of her work-up for kidney disease and was noted to have a M spike of 0.6 with normal lambda light chain ratio.  Immunofixation showed IgG kappa monoclonal protein.  Patient had hep C antibody which was negative, ANA negative and C ANCA negative, P-ANCA negative, anti-double-stranded DNA antibody negative and complements were within normal limits.  Her creatinine at that time was 1.32 and hemoglobin was 11.8.  Also she had no significant protein in her urine. -She also had a bone scan which was noted to be negative.  She never had a bone marrow biopsy done at this time. - Patient was being followed by Dr. Tressie Stalker at our clinic  since early 2017. - Labs on 06/01/2018 shows potassium 5.1, creatinine 1.67, calcium 8.6, LFTs were normal, LDH 213, SPEP stable with M spike 0.6 g/dL.  WBC 7.8, hemoglobin 9.9, platelets 156.  Light chain ratio 1.82. -Labs on 07/05/2018 showed her potassium 5.5, creatinine 1.72, calcium 8.7, hemoglobin 10.1. -We repeated her potassium at her visit on 07/12/2018 which showed her potassium 4.8. - She reports no B symptoms including fevers, chills, night sweats, or unexplained weight loss. -Skeletal survey done on 07/05/2018 showed no lytic lesions. -Bone marrow biopsy done on 11/25/2018.  Pathology consistent with hypercellular marrow with trilineage hematopoiesis and 2% plasma cells.  FISH was negative.  Cytogenetics was normal. -When trending her anemia and renal function since 2017 it has progressively gotten worse.  Which is concerning for multiple myeloma.  Patient was sent to [facility] for second opinion. -Bone marrow biopsy done by [facility] on 02/08/2019.  Pathology was consistent with normocellular bone marrow with trilineage hematopoiesis.  And 3% plasma cells.  FISH was negative.  Negative for amyloid deposits position by stain. -Cardiac MRI done at [facility] on 02/08/2019 showed the left ventricle moderately dilated in size with normal wall thickness.  Global systolic function is moderately reduced with a LVEF of 44%. [facility] referred her to a cardiologist in their system for a second opinion she sees this doctor on 03/06/2019.  She is following up with them closely. -Repeat labs done on 06/23/2019 showed M spike of 0.8 g/dL, creatinine 2.07, hemoglobin 10.8, ferritin 79, percent saturation 25 -We will give her 2 infusions of IV iron. -She will follow-up in 2 months with repeat labs  2.  Anemia: - Likely due from CKD. -Labs on 02/02/2019 showed her hemoglobin 11.2, creatinine 1.75 -Labs done on 06/23/2019 showed hemoglobin 10.8, ferritin  79, percent saturation 25 -We will give her 2 infusions of IV iron -We will recheck iron studies on her next visit.  3.  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 -Labs on 02/02/2019 showed her creatinine 1.75 -Labs done on 06/23/2019 showed her creatinine is gone up to 2.07 -She has a follow-up appointment with her nephrologist      Orders placed this encounter:  Orders Placed This Encounter  Procedures  . Lactate dehydrogenase  . Protein electrophoresis, serum  . Immunofixation electrophoresis  . Kappa/lambda light chains  . CBC with Differential/Platelet  . Comprehensive metabolic panel  . Ferritin  . Iron and TIBC  . Vitamin B12  . VITAMIN D 25 Hydroxy (Vit-D Deficiency, Fractures)  . Folate  . IgG, IgA, IgM      Francene Finders, FNP-C [place] [phone]

## 2019-06-30 NOTE — Patient Instructions (Signed)
[facility] Discharge Instructions  Follow up in 2 months with labs    Thank you for choosing [facility] at [facility] to provide your oncology and hematology care.  To afford each patient quality time with our provider, please arrive at least 15 minutes before your scheduled appointment time.   If you have a lab appointment with the Cancer Center please come in thru the Main Entrance and check in at the main information desk.  You need to re-schedule your appointment should you arrive 10 or more minutes late.  We strive to give you quality time with our providers, and arriving late affects you and other patients whose appointments are after yours.  Also, if you no show three or more times for appointments you may be dismissed from the clinic at the providers discretion.     Again, thank you for choosing [facility].  Our hope is that these requests will decrease the amount of time that you wait before being seen by our physicians.       _____________________________________________________________  Should you have questions after your visit to [facility], please contact our office at [phone] between the hours of 8:00 a.m. and 4:30 p.m.  Voicemails left after 4:00 p.m. will not be returned until the following business day.  For prescription refill requests, have your pharmacy contact our office and allow 72 hours.    Due to Covid, you will need to wear a mask upon entering the hospital. If you do not have a mask, a mask will be given to you at the Main Entrance upon arrival. For doctor visits, patients may have 1 support person with them. For treatment visits, patients can not have anyone with them due to social distancing guidelines and our immunocompromised population.      

## 2019-06-30 NOTE — Assessment & Plan Note (Signed)
1.  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  She was noted to have an elevated creatinine and was seen by nephrologist in December 2014.  She had an SPEP done as part of her work-up for kidney disease and was noted to have a M spike of 0.6 with normal lambda light chain ratio.  Immunofixation showed IgG kappa monoclonal protein.  Patient had hep C antibody which was negative, ANA negative and C ANCA negative, P-ANCA negative, anti-double-stranded DNA antibody negative and complements were within normal limits.  Her creatinine at that time was 1.32 and hemoglobin was 11.8.  Also she had no significant protein in her urine. -She also had a bone scan which was noted to be negative.  She never had a bone marrow biopsy done at this time. - Patient was being followed by Dr. Tressie Stalker at our clinic since early 2017. - Labs on 06/01/2018 shows potassium 5.1, creatinine 1.67, calcium 8.6, LFTs were normal, LDH 213, SPEP stable with M spike 0.6 g/dL.  WBC 7.8, hemoglobin 9.9, platelets 156.  Light chain ratio 1.82. -Labs on 07/05/2018 showed her potassium 5.5, creatinine 1.72, calcium 8.7, hemoglobin 10.1. -We repeated her potassium at her visit on 07/12/2018 which showed her potassium 4.8. - She reports no B symptoms including fevers, chills, night sweats, or unexplained weight loss. -Skeletal survey done on 07/05/2018 showed no lytic lesions. -Bone marrow biopsy done on 11/25/2018.  Pathology consistent with hypercellular marrow with trilineage hematopoiesis and 2% plasma cells.  FISH was negative.  Cytogenetics was normal. -When trending her anemia and renal function since 2017 it has progressively gotten worse.  Which is concerning for multiple myeloma.  Patient was sent to [facility] for second opinion. -Bone marrow biopsy done by [facility] on 02/08/2019.  Pathology was consistent with normocellular  bone marrow with trilineage hematopoiesis.  And 3% plasma cells.  FISH was negative.  Negative for amyloid deposits position by stain. -Cardiac MRI done at [facility] on 02/08/2019 showed the left ventricle moderately dilated in size with normal wall thickness.  Global systolic function is moderately reduced with a LVEF of 44%. [facility] referred her to a cardiologist in their system for a second opinion she sees this doctor on 03/06/2019.  She is following up with them closely. -Repeat labs done on 06/23/2019 showed M spike of 0.8 g/dL, creatinine 2.07, hemoglobin 10.8, ferritin 79, percent saturation 25 -We will give her 2 infusions of IV iron. -She will follow-up in 2 months with repeat labs  2.  Anemia: - Likely due from CKD. -Labs on 02/02/2019 showed her hemoglobin 11.2, creatinine 1.75 -Labs done on 06/23/2019 showed hemoglobin 10.8, ferritin 79, percent saturation 25 -We will give her 2 infusions of IV iron -We will recheck iron studies on her next visit.  3.  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 -Labs on 02/02/2019 showed her creatinine 1.75 -Labs done on 06/23/2019 showed her creatinine is gone up to 2.07 -She has a follow-up appointment with her nephrologist

## 2019-07-03 DIAGNOSIS — I10 Essential (primary) hypertension: Secondary | ICD-10-CM | POA: Diagnosis not present

## 2019-07-03 DIAGNOSIS — Z299 Encounter for prophylactic measures, unspecified: Secondary | ICD-10-CM | POA: Diagnosis not present

## 2019-07-03 DIAGNOSIS — Z87891 Personal history of nicotine dependence: Secondary | ICD-10-CM | POA: Diagnosis not present

## 2019-07-03 DIAGNOSIS — G4733 Obstructive sleep apnea (adult) (pediatric): Secondary | ICD-10-CM | POA: Diagnosis not present

## 2019-07-04 DIAGNOSIS — M9901 Segmental and somatic dysfunction of cervical region: Secondary | ICD-10-CM | POA: Diagnosis not present

## 2019-07-04 DIAGNOSIS — M9902 Segmental and somatic dysfunction of thoracic region: Secondary | ICD-10-CM | POA: Diagnosis not present

## 2019-07-04 DIAGNOSIS — M9903 Segmental and somatic dysfunction of lumbar region: Secondary | ICD-10-CM | POA: Diagnosis not present

## 2019-07-04 DIAGNOSIS — S233XXA Sprain of ligaments of thoracic spine, initial encounter: Secondary | ICD-10-CM | POA: Diagnosis not present

## 2019-07-04 DIAGNOSIS — M531 Cervicobrachial syndrome: Secondary | ICD-10-CM | POA: Diagnosis not present

## 2019-07-04 DIAGNOSIS — S338XXA Sprain of other parts of lumbar spine and pelvis, initial encounter: Secondary | ICD-10-CM | POA: Diagnosis not present

## 2019-07-07 ENCOUNTER — Encounter (HOSPITAL_COMMUNITY): Payer: Self-pay

## 2019-07-07 ENCOUNTER — Inpatient Hospital Stay (HOSPITAL_COMMUNITY): Payer: Medicare Other

## 2019-07-07 VITALS — BP 138/79 | HR 60 | Temp 97.7°F | Resp 18

## 2019-07-07 DIAGNOSIS — N184 Chronic kidney disease, stage 4 (severe): Secondary | ICD-10-CM | POA: Diagnosis not present

## 2019-07-07 DIAGNOSIS — D649 Anemia, unspecified: Secondary | ICD-10-CM

## 2019-07-07 MED ORDER — SODIUM CHLORIDE 0.9 % IV SOLN
510.0000 mg | Freq: Once | INTRAVENOUS | Status: AC
  Administered 2019-07-07: 510 mg via INTRAVENOUS
  Filled 2019-07-07: qty 510

## 2019-07-07 MED ORDER — SODIUM CHLORIDE 0.9 % IV SOLN: Freq: Once | INTRAVENOUS | Status: AC

## 2019-07-07 NOTE — Progress Notes (Signed)
Patient tolerated iron infusion with no complaints voiced.  Peripheral IV site clean and dry with good blood return noted before and after infusion.  Band aid applied.  VSS with discharge and left ambulatory with no s/s of distress noted.  

## 2019-07-14 ENCOUNTER — Ambulatory Visit: Payer: Medicare Other | Admitting: Cardiology

## 2019-07-14 ENCOUNTER — Inpatient Hospital Stay (HOSPITAL_COMMUNITY): Payer: Medicare Other

## 2019-07-14 ENCOUNTER — Other Ambulatory Visit: Payer: Self-pay

## 2019-07-14 VITALS — BP 158/49 | HR 63 | Temp 97.7°F | Resp 18

## 2019-07-14 DIAGNOSIS — N184 Chronic kidney disease, stage 4 (severe): Secondary | ICD-10-CM | POA: Diagnosis not present

## 2019-07-14 DIAGNOSIS — D649 Anemia, unspecified: Secondary | ICD-10-CM

## 2019-07-14 MED ORDER — SODIUM CHLORIDE 0.9 % IV SOLN
510.0000 mg | Freq: Once | INTRAVENOUS | Status: AC
  Administered 2019-07-14: 510 mg via INTRAVENOUS
  Filled 2019-07-14: qty 510

## 2019-07-14 MED ORDER — SODIUM CHLORIDE 0.9 % IV SOLN: INTRAVENOUS | Status: DC

## 2019-07-14 NOTE — Progress Notes (Signed)
Iron infusion given per orders. Patient tolerated it well without problems. Vitals stable and discharged home from clinic ambulatory. Follow up as scheduled.  

## 2019-07-14 NOTE — Patient Instructions (Signed)
[facility] at [facility]  Discharge Instructions:   _______________________________________________________________  Thank you for choosing [facility] at [facility] to provide your oncology and hematology care.  To afford each patient quality time with our providers, please arrive at least 15 minutes before your scheduled appointment.  You need to re-schedule your appointment if you arrive 10 or more minutes late.  We strive to give you quality time with our providers, and arriving late affects you and other patients whose appointments are after yours.  Also, if you no show three or more times for appointments you may be dismissed from the clinic.  Again, thank you for choosing [facility] at [facility]. Our hope is that these requests will allow you access to exceptional care and in a timely manner. _______________________________________________________________  If you have questions after your visit, please contact our office at [phone] between the hours of 8:30 a.m. and 5:00 p.m. Voicemails left after 4:30 p.m. will not be returned until the following business day. _______________________________________________________________  For prescription refill requests, have your pharmacy contact our office. _______________________________________________________________  Recommendations made by the consultant and any test results will be sent to your referring physician. _______________________________________________________________ 

## 2019-07-19 DIAGNOSIS — I2510 Atherosclerotic heart disease of native coronary artery without angina pectoris: Secondary | ICD-10-CM | POA: Diagnosis not present

## 2019-07-19 DIAGNOSIS — F3112 Bipolar disorder, current episode manic without psychotic features, moderate: Secondary | ICD-10-CM | POA: Diagnosis not present

## 2019-07-19 DIAGNOSIS — E7800 Pure hypercholesterolemia, unspecified: Secondary | ICD-10-CM | POA: Diagnosis not present

## 2019-07-19 DIAGNOSIS — F411 Generalized anxiety disorder: Secondary | ICD-10-CM | POA: Diagnosis not present

## 2019-07-19 DIAGNOSIS — I10 Essential (primary) hypertension: Secondary | ICD-10-CM | POA: Diagnosis not present

## 2019-07-19 DIAGNOSIS — F4310 Post-traumatic stress disorder, unspecified: Secondary | ICD-10-CM | POA: Diagnosis not present

## 2019-07-19 DIAGNOSIS — E119 Type 2 diabetes mellitus without complications: Secondary | ICD-10-CM | POA: Diagnosis not present

## 2019-08-01 DIAGNOSIS — L609 Nail disorder, unspecified: Secondary | ICD-10-CM | POA: Diagnosis not present

## 2019-08-01 DIAGNOSIS — E1140 Type 2 diabetes mellitus with diabetic neuropathy, unspecified: Secondary | ICD-10-CM | POA: Diagnosis not present

## 2019-08-01 DIAGNOSIS — L110 Acquired keratosis follicularis: Secondary | ICD-10-CM | POA: Diagnosis not present

## 2019-08-16 DIAGNOSIS — S233XXA Sprain of ligaments of thoracic spine, initial encounter: Secondary | ICD-10-CM | POA: Diagnosis not present

## 2019-08-16 DIAGNOSIS — M9902 Segmental and somatic dysfunction of thoracic region: Secondary | ICD-10-CM | POA: Diagnosis not present

## 2019-08-16 DIAGNOSIS — M9901 Segmental and somatic dysfunction of cervical region: Secondary | ICD-10-CM | POA: Diagnosis not present

## 2019-08-16 DIAGNOSIS — S338XXA Sprain of other parts of lumbar spine and pelvis, initial encounter: Secondary | ICD-10-CM | POA: Diagnosis not present

## 2019-08-16 DIAGNOSIS — M531 Cervicobrachial syndrome: Secondary | ICD-10-CM | POA: Diagnosis not present

## 2019-08-16 DIAGNOSIS — M9903 Segmental and somatic dysfunction of lumbar region: Secondary | ICD-10-CM | POA: Diagnosis not present

## 2019-08-18 DIAGNOSIS — E7800 Pure hypercholesterolemia, unspecified: Secondary | ICD-10-CM | POA: Diagnosis not present

## 2019-08-18 DIAGNOSIS — E119 Type 2 diabetes mellitus without complications: Secondary | ICD-10-CM | POA: Diagnosis not present

## 2019-08-18 DIAGNOSIS — I10 Essential (primary) hypertension: Secondary | ICD-10-CM | POA: Diagnosis not present

## 2019-08-18 DIAGNOSIS — I2510 Atherosclerotic heart disease of native coronary artery without angina pectoris: Secondary | ICD-10-CM | POA: Diagnosis not present

## 2019-08-23 DIAGNOSIS — N1832 Chronic kidney disease, stage 3b: Secondary | ICD-10-CM | POA: Diagnosis not present

## 2019-08-29 DIAGNOSIS — Z6841 Body Mass Index (BMI) 40.0 and over, adult: Secondary | ICD-10-CM | POA: Diagnosis not present

## 2019-08-29 DIAGNOSIS — Z299 Encounter for prophylactic measures, unspecified: Secondary | ICD-10-CM | POA: Diagnosis not present

## 2019-08-29 DIAGNOSIS — I10 Essential (primary) hypertension: Secondary | ICD-10-CM | POA: Diagnosis not present

## 2019-08-29 DIAGNOSIS — M9902 Segmental and somatic dysfunction of thoracic region: Secondary | ICD-10-CM | POA: Diagnosis not present

## 2019-08-29 DIAGNOSIS — E1165 Type 2 diabetes mellitus with hyperglycemia: Secondary | ICD-10-CM | POA: Diagnosis not present

## 2019-08-29 DIAGNOSIS — M549 Dorsalgia, unspecified: Secondary | ICD-10-CM | POA: Diagnosis not present

## 2019-08-29 DIAGNOSIS — M9901 Segmental and somatic dysfunction of cervical region: Secondary | ICD-10-CM | POA: Diagnosis not present

## 2019-08-29 DIAGNOSIS — M9903 Segmental and somatic dysfunction of lumbar region: Secondary | ICD-10-CM | POA: Diagnosis not present

## 2019-08-29 DIAGNOSIS — Z515 Encounter for palliative care: Secondary | ICD-10-CM | POA: Diagnosis not present

## 2019-08-29 DIAGNOSIS — I4891 Unspecified atrial fibrillation: Secondary | ICD-10-CM | POA: Diagnosis not present

## 2019-08-29 DIAGNOSIS — S233XXA Sprain of ligaments of thoracic spine, initial encounter: Secondary | ICD-10-CM | POA: Diagnosis not present

## 2019-08-29 DIAGNOSIS — S335XXA Sprain of ligaments of lumbar spine, initial encounter: Secondary | ICD-10-CM | POA: Diagnosis not present

## 2019-08-29 DIAGNOSIS — M531 Cervicobrachial syndrome: Secondary | ICD-10-CM | POA: Diagnosis not present

## 2019-08-30 ENCOUNTER — Encounter (INDEPENDENT_AMBULATORY_CARE_PROVIDER_SITE_OTHER): Payer: Medicare Other | Admitting: Ophthalmology

## 2019-08-30 DIAGNOSIS — N189 Chronic kidney disease, unspecified: Secondary | ICD-10-CM | POA: Diagnosis not present

## 2019-08-30 DIAGNOSIS — E875 Hyperkalemia: Secondary | ICD-10-CM | POA: Diagnosis not present

## 2019-08-30 DIAGNOSIS — E1122 Type 2 diabetes mellitus with diabetic chronic kidney disease: Secondary | ICD-10-CM | POA: Diagnosis not present

## 2019-08-30 DIAGNOSIS — R809 Proteinuria, unspecified: Secondary | ICD-10-CM | POA: Diagnosis not present

## 2019-08-30 DIAGNOSIS — E1129 Type 2 diabetes mellitus with other diabetic kidney complication: Secondary | ICD-10-CM | POA: Diagnosis not present

## 2019-08-30 DIAGNOSIS — N170 Acute kidney failure with tubular necrosis: Secondary | ICD-10-CM | POA: Diagnosis not present

## 2019-08-30 DIAGNOSIS — I5032 Chronic diastolic (congestive) heart failure: Secondary | ICD-10-CM | POA: Diagnosis not present

## 2019-08-31 DIAGNOSIS — M9902 Segmental and somatic dysfunction of thoracic region: Secondary | ICD-10-CM | POA: Diagnosis not present

## 2019-08-31 DIAGNOSIS — M531 Cervicobrachial syndrome: Secondary | ICD-10-CM | POA: Diagnosis not present

## 2019-08-31 DIAGNOSIS — S233XXA Sprain of ligaments of thoracic spine, initial encounter: Secondary | ICD-10-CM | POA: Diagnosis not present

## 2019-08-31 DIAGNOSIS — S335XXA Sprain of ligaments of lumbar spine, initial encounter: Secondary | ICD-10-CM | POA: Diagnosis not present

## 2019-08-31 DIAGNOSIS — M9901 Segmental and somatic dysfunction of cervical region: Secondary | ICD-10-CM | POA: Diagnosis not present

## 2019-08-31 DIAGNOSIS — M9903 Segmental and somatic dysfunction of lumbar region: Secondary | ICD-10-CM | POA: Diagnosis not present

## 2019-09-01 DIAGNOSIS — M9903 Segmental and somatic dysfunction of lumbar region: Secondary | ICD-10-CM | POA: Diagnosis not present

## 2019-09-01 DIAGNOSIS — S233XXA Sprain of ligaments of thoracic spine, initial encounter: Secondary | ICD-10-CM | POA: Diagnosis not present

## 2019-09-01 DIAGNOSIS — S335XXA Sprain of ligaments of lumbar spine, initial encounter: Secondary | ICD-10-CM | POA: Diagnosis not present

## 2019-09-01 DIAGNOSIS — M9901 Segmental and somatic dysfunction of cervical region: Secondary | ICD-10-CM | POA: Diagnosis not present

## 2019-09-01 DIAGNOSIS — M9902 Segmental and somatic dysfunction of thoracic region: Secondary | ICD-10-CM | POA: Diagnosis not present

## 2019-09-01 DIAGNOSIS — M531 Cervicobrachial syndrome: Secondary | ICD-10-CM | POA: Diagnosis not present

## 2019-09-04 DIAGNOSIS — M9901 Segmental and somatic dysfunction of cervical region: Secondary | ICD-10-CM | POA: Diagnosis not present

## 2019-09-04 DIAGNOSIS — M9903 Segmental and somatic dysfunction of lumbar region: Secondary | ICD-10-CM | POA: Diagnosis not present

## 2019-09-04 DIAGNOSIS — S335XXA Sprain of ligaments of lumbar spine, initial encounter: Secondary | ICD-10-CM | POA: Diagnosis not present

## 2019-09-04 DIAGNOSIS — M531 Cervicobrachial syndrome: Secondary | ICD-10-CM | POA: Diagnosis not present

## 2019-09-04 DIAGNOSIS — M9902 Segmental and somatic dysfunction of thoracic region: Secondary | ICD-10-CM | POA: Diagnosis not present

## 2019-09-04 DIAGNOSIS — S233XXA Sprain of ligaments of thoracic spine, initial encounter: Secondary | ICD-10-CM | POA: Diagnosis not present

## 2019-09-06 ENCOUNTER — Encounter (INDEPENDENT_AMBULATORY_CARE_PROVIDER_SITE_OTHER): Payer: Medicare Other | Admitting: Ophthalmology

## 2019-09-06 ENCOUNTER — Other Ambulatory Visit: Payer: Self-pay

## 2019-09-06 DIAGNOSIS — E113312 Type 2 diabetes mellitus with moderate nonproliferative diabetic retinopathy with macular edema, left eye: Secondary | ICD-10-CM

## 2019-09-06 DIAGNOSIS — I10 Essential (primary) hypertension: Secondary | ICD-10-CM | POA: Diagnosis not present

## 2019-09-06 DIAGNOSIS — E11311 Type 2 diabetes mellitus with unspecified diabetic retinopathy with macular edema: Secondary | ICD-10-CM

## 2019-09-06 DIAGNOSIS — E113391 Type 2 diabetes mellitus with moderate nonproliferative diabetic retinopathy without macular edema, right eye: Secondary | ICD-10-CM

## 2019-09-06 DIAGNOSIS — H35033 Hypertensive retinopathy, bilateral: Secondary | ICD-10-CM | POA: Diagnosis not present

## 2019-09-06 DIAGNOSIS — H43813 Vitreous degeneration, bilateral: Secondary | ICD-10-CM

## 2019-09-07 ENCOUNTER — Inpatient Hospital Stay (HOSPITAL_COMMUNITY): Payer: Medicare Other | Attending: Hematology

## 2019-09-07 DIAGNOSIS — D649 Anemia, unspecified: Secondary | ICD-10-CM | POA: Diagnosis not present

## 2019-09-07 DIAGNOSIS — S233XXA Sprain of ligaments of thoracic spine, initial encounter: Secondary | ICD-10-CM | POA: Diagnosis not present

## 2019-09-07 DIAGNOSIS — M9903 Segmental and somatic dysfunction of lumbar region: Secondary | ICD-10-CM | POA: Diagnosis not present

## 2019-09-07 DIAGNOSIS — M9902 Segmental and somatic dysfunction of thoracic region: Secondary | ICD-10-CM | POA: Diagnosis not present

## 2019-09-07 DIAGNOSIS — D472 Monoclonal gammopathy: Secondary | ICD-10-CM | POA: Insufficient documentation

## 2019-09-07 DIAGNOSIS — S335XXA Sprain of ligaments of lumbar spine, initial encounter: Secondary | ICD-10-CM | POA: Diagnosis not present

## 2019-09-07 DIAGNOSIS — M531 Cervicobrachial syndrome: Secondary | ICD-10-CM | POA: Diagnosis not present

## 2019-09-07 DIAGNOSIS — N189 Chronic kidney disease, unspecified: Secondary | ICD-10-CM | POA: Insufficient documentation

## 2019-09-07 DIAGNOSIS — M9901 Segmental and somatic dysfunction of cervical region: Secondary | ICD-10-CM | POA: Diagnosis not present

## 2019-09-07 LAB — COMPREHENSIVE METABOLIC PANEL
ALT: 26 U/L (ref 0–44)
AST: 26 U/L (ref 15–41)
Albumin: 4 g/dL (ref 3.5–5.0)
Alkaline Phosphatase: 45 U/L (ref 38–126)
Anion gap: 10 (ref 5–15)
BUN: 62 mg/dL — ABNORMAL HIGH (ref 8–23)
CO2: 28 mmol/L (ref 22–32)
Calcium: 9.5 mg/dL (ref 8.9–10.3)
Chloride: 98 mmol/L (ref 98–111)
Creatinine, Ser: 1.77 mg/dL — ABNORMAL HIGH (ref 0.44–1.00)
GFR calc Af Amer: 33 mL/min — ABNORMAL LOW (ref >60)
GFR calc non Af Amer: 29 mL/min — ABNORMAL LOW (ref >60)
Glucose, Bld: 122 mg/dL — ABNORMAL HIGH (ref 70–99)
Potassium: 4.6 mmol/L (ref 3.5–5.1)
Sodium: 136 mmol/L (ref 135–145)
Total Bilirubin: 0.3 mg/dL (ref 0.3–1.2)
Total Protein: 7.4 g/dL (ref 6.5–8.1)

## 2019-09-07 LAB — CBC WITH DIFFERENTIAL/PLATELET
Abs Immature Granulocytes: 0.02 10*3/uL (ref 0.00–0.07)
Basophils Absolute: 0.1 10*3/uL (ref 0.0–0.1)
Basophils Relative: 1 %
Eosinophils Absolute: 0.6 10*3/uL — ABNORMAL HIGH (ref 0.0–0.5)
Eosinophils Relative: 7 %
HCT: 37.7 % (ref 36.0–46.0)
Hemoglobin: 11.9 g/dL — ABNORMAL LOW (ref 12.0–15.0)
Immature Granulocytes: 0 %
Lymphocytes Relative: 24 %
Lymphs Abs: 1.9 10*3/uL (ref 0.7–4.0)
MCH: 32.3 pg (ref 26.0–34.0)
MCHC: 31.6 g/dL (ref 30.0–36.0)
MCV: 102.4 fL — ABNORMAL HIGH (ref 80.0–100.0)
Monocytes Absolute: 0.6 10*3/uL (ref 0.1–1.0)
Monocytes Relative: 8 %
Neutro Abs: 4.7 10*3/uL (ref 1.7–7.7)
Neutrophils Relative %: 60 %
Platelets: 199 10*3/uL (ref 150–400)
RBC: 3.68 MIL/uL — ABNORMAL LOW (ref 3.87–5.11)
RDW: 13 % (ref 11.5–15.5)
WBC: 7.8 10*3/uL (ref 4.0–10.5)
nRBC: 0 % (ref 0.0–0.2)

## 2019-09-07 LAB — FOLATE: Folate: 50.8 ng/mL (ref >5.9)

## 2019-09-07 LAB — FERRITIN: Ferritin: 330 ng/mL — ABNORMAL HIGH (ref 11–307)

## 2019-09-07 LAB — VITAMIN D 25 HYDROXY (VIT D DEFICIENCY, FRACTURES): Vit D, 25-Hydroxy: 45.49 ng/mL (ref 30–100)

## 2019-09-07 LAB — IRON AND TIBC
Iron: 87 ug/dL (ref 28–170)
Saturation Ratios: 33 % — ABNORMAL HIGH (ref 10.4–31.8)
TIBC: 263 ug/dL (ref 250–450)
UIBC: 176 ug/dL

## 2019-09-07 LAB — VITAMIN B12: Vitamin B-12: 1029 pg/mL — ABNORMAL HIGH (ref 180–914)

## 2019-09-07 LAB — LACTATE DEHYDROGENASE: LDH: 189 U/L (ref 98–192)

## 2019-09-08 LAB — KAPPA/LAMBDA LIGHT CHAINS
Kappa free light chain: 87.7 mg/L — ABNORMAL HIGH (ref 3.3–19.4)
Kappa, lambda light chain ratio: 1.89 — ABNORMAL HIGH (ref 0.26–1.65)
Lambda free light chains: 46.4 mg/L — ABNORMAL HIGH (ref 5.7–26.3)

## 2019-09-08 LAB — IGG, IGA, IGM
IgA: 153 mg/dL (ref 87–352)
IgG (Immunoglobin G), Serum: 1294 mg/dL (ref 586–1602)
IgM (Immunoglobulin M), Srm: 241 mg/dL — ABNORMAL HIGH (ref 26–217)

## 2019-09-11 DIAGNOSIS — M9901 Segmental and somatic dysfunction of cervical region: Secondary | ICD-10-CM | POA: Diagnosis not present

## 2019-09-11 DIAGNOSIS — M531 Cervicobrachial syndrome: Secondary | ICD-10-CM | POA: Diagnosis not present

## 2019-09-11 DIAGNOSIS — S335XXA Sprain of ligaments of lumbar spine, initial encounter: Secondary | ICD-10-CM | POA: Diagnosis not present

## 2019-09-11 DIAGNOSIS — S233XXA Sprain of ligaments of thoracic spine, initial encounter: Secondary | ICD-10-CM | POA: Diagnosis not present

## 2019-09-11 DIAGNOSIS — M9902 Segmental and somatic dysfunction of thoracic region: Secondary | ICD-10-CM | POA: Diagnosis not present

## 2019-09-11 DIAGNOSIS — M9903 Segmental and somatic dysfunction of lumbar region: Secondary | ICD-10-CM | POA: Diagnosis not present

## 2019-09-11 LAB — PROTEIN ELECTROPHORESIS, SERUM
A/G Ratio: 1.1 (ref 0.7–1.7)
Albumin ELP: 3.3 g/dL (ref 2.9–4.4)
Alpha-1-Globulin: 0.2 g/dL (ref 0.0–0.4)
Alpha-2-Globulin: 0.6 g/dL (ref 0.4–1.0)
Beta Globulin: 0.9 g/dL (ref 0.7–1.3)
Gamma Globulin: 1.4 g/dL (ref 0.4–1.8)
Globulin, Total: 3.1 g/dL (ref 2.2–3.9)
M-Spike, %: 0.7 g/dL — ABNORMAL HIGH
Total Protein ELP: 6.4 g/dL (ref 6.0–8.5)

## 2019-09-11 LAB — IMMUNOFIXATION ELECTROPHORESIS
IgA: 154 mg/dL (ref 87–352)
IgG (Immunoglobin G), Serum: 1340 mg/dL (ref 586–1602)
IgM (Immunoglobulin M), Srm: 240 mg/dL — ABNORMAL HIGH (ref 26–217)
Total Protein ELP: 7 g/dL (ref 6.0–8.5)

## 2019-09-13 DIAGNOSIS — S134XXA Sprain of ligaments of cervical spine, initial encounter: Secondary | ICD-10-CM | POA: Diagnosis not present

## 2019-09-13 DIAGNOSIS — M9902 Segmental and somatic dysfunction of thoracic region: Secondary | ICD-10-CM | POA: Diagnosis not present

## 2019-09-13 DIAGNOSIS — M47816 Spondylosis without myelopathy or radiculopathy, lumbar region: Secondary | ICD-10-CM | POA: Diagnosis not present

## 2019-09-13 DIAGNOSIS — M9901 Segmental and somatic dysfunction of cervical region: Secondary | ICD-10-CM | POA: Diagnosis not present

## 2019-09-13 DIAGNOSIS — S335XXA Sprain of ligaments of lumbar spine, initial encounter: Secondary | ICD-10-CM | POA: Diagnosis not present

## 2019-09-13 DIAGNOSIS — M9903 Segmental and somatic dysfunction of lumbar region: Secondary | ICD-10-CM | POA: Diagnosis not present

## 2019-09-14 ENCOUNTER — Inpatient Hospital Stay (HOSPITAL_COMMUNITY): Payer: Medicare Other | Attending: Hematology | Admitting: Hematology

## 2019-09-14 ENCOUNTER — Other Ambulatory Visit: Payer: Self-pay

## 2019-09-14 VITALS — BP 168/56 | HR 61 | Temp 97.5°F | Resp 20 | Wt 222.9 lb

## 2019-09-14 DIAGNOSIS — I129 Hypertensive chronic kidney disease with stage 1 through stage 4 chronic kidney disease, or unspecified chronic kidney disease: Secondary | ICD-10-CM | POA: Diagnosis not present

## 2019-09-14 DIAGNOSIS — D472 Monoclonal gammopathy: Secondary | ICD-10-CM | POA: Insufficient documentation

## 2019-09-14 DIAGNOSIS — E1122 Type 2 diabetes mellitus with diabetic chronic kidney disease: Secondary | ICD-10-CM | POA: Insufficient documentation

## 2019-09-14 DIAGNOSIS — D509 Iron deficiency anemia, unspecified: Secondary | ICD-10-CM | POA: Insufficient documentation

## 2019-09-14 DIAGNOSIS — N184 Chronic kidney disease, stage 4 (severe): Secondary | ICD-10-CM | POA: Diagnosis not present

## 2019-09-14 NOTE — Progress Notes (Signed)
Christy [name], [place] [address]   CLINIC:  Medical Oncology/Hematology  PCP:  Monico Blitz, [place] [address]  [phone]  REASON FOR VISIT:  Follow-up for MGUS  PRIOR THERAPY: None  CURRENT THERAPY: Observation  INTERVAL HISTORY:  Christy [name], a 69 y.o. female, returns for routine follow-up for her MGUS. Geovanna was last seen by Dr. Mathis Dad Higgs on 12/01/2017.  Today she is accompanied by her daughter. She reports that she has been feeling well. She denies having any new bone pains, though she reports having pain in her lower back after lifting a heavy box several weeks ago. She denies having any recent infections, F/C, or night sweats. Her last Feraheme was on 6/18 and 6/25, which helped her greatly; she has never had blood transfusions before. She denies having any hematochezia or hematuria.   REVIEW OF SYSTEMS:  Review of Systems  Constitutional: Positive for fatigue (moderate). Negative for appetite change, chills, diaphoresis and fever.  Gastrointestinal: Negative for blood in stool.  Genitourinary: Negative for hematuria.   All other systems reviewed and are negative.   PAST MEDICAL/SURGICAL HISTORY:  Past Medical History:  Diagnosis Date  . Bipolar disorder ([place])   . Coronary atherosclerosis of native coronary artery    BMS circ 2006, 70% PDA 2009  . Essential hypertension   . Mixed hyperlipidemia   . OSA on CPAP   . Renal tubular acidosis, type IV   . SVT (supraventricular tachycardia) ([place])   . Type 2 diabetes mellitus ([place])    Past Surgical History:  Procedure Laterality Date  . Acromioclavicular arthritis    . CARDIAC CATHETERIZATION N/A 03/29/2015   Procedure: Left Heart Cath and Coronary Angiography;  Surgeon: Troy Sine, MD;  Location: [facility];  Service: Cardiovascular;  Laterality: N/A;  . RIght shoulder adhesive capsulitis    . Rotator cuff impingement syndrome    . Rotator  cuff tear      SOCIAL HISTORY:  Social History   Socioeconomic History  . Marital status: Divorced    Spouse name: Not on file  . Number of children: Not on file  . Years of education: Not on file  . Highest education level: Not on file  Occupational History  . Occupation: PLANNING AND INSPECTOR    Employer: [employer]  Tobacco Use  . Smoking status: Former Smoker    Packs/day: 1.00    Years: 20.00    Pack years: 20.00    Types: Cigarettes    Start date: 10/30/1984    Quit date: 07/08/2013    Years since quitting: 6.1  . Smokeless tobacco: Never Used  Substance and Sexual Activity  . Alcohol use: No    Alcohol/week: 0.0 standard drinks  . Drug use: No  . Sexual activity: Not on file  Other Topics Concern  . Not on file  Social History Narrative   She has lived with some of her daughters. Works part-time for the city.    Social Determinants of Health   Financial Resource Strain:   . Difficulty of Paying Living Expenses: Not on file  Food Insecurity:   . Worried About Charity fundraiser in the Last Year: Not on file  . Ran Out of Food in the Last Year: Not on file  Transportation Needs:   . Lack of Transportation (Medical): Not on file  . Lack of Transportation (Non-Medical): Not on file  Physical  Activity:   . Days of Exercise per Week: Not on file  . Minutes of Exercise per Session: Not on file  Stress:   . Feeling of Stress : Not on file  Social Connections:   . Frequency of Communication with Friends and Family: Not on file  . Frequency of Social Gatherings with Friends and Family: Not on file  . Attends Religious Services: Not on file  . Active Member of Clubs or Organizations: Not on file  . Attends Archivist Meetings: Not on file  . Marital Status: Not on file  Intimate Partner Violence:   . Fear of Current or Ex-Partner: Not on file  . Emotionally Abused: Not on file  . Physically Abused: Not on file  . Sexually Abused: Not on file     FAMILY HISTORY:  Family History  Problem Relation Age of Onset  . Bipolar disorder Brother   . Diabetes Father     CURRENT MEDICATIONS:  Current Outpatient Medications  Medication Sig Dispense Refill  . allopurinol (ZYLOPRIM) 300 MG tablet Take 150 mg by mouth daily.    Marland Kitchen amLODipine (NORVASC) 5 MG tablet Take 5 mg by mouth daily.    Marland Kitchen aspirin 81 MG EC tablet Take 81 mg by mouth daily.      Marland Kitchen BESIVANCE 0.6 % SUSP INSTILL ONE DROP IN THE LEFT EYE FOUR TIMES DAILY FOR 2 DAYS AFTER EACH MONTHLY EYE INJECTION  12  . buPROPion (WELLBUTRIN XL) 300 MG 24 hr tablet Take 300 mg by mouth daily.    . calcium carbonate (OS-CAL) 600 MG TABS Take 600 mg by mouth daily.      . carvedilol (COREG) 12.5 MG tablet Take 12.5 mg by mouth 2 (two) times daily with a meal.    . Cholecalciferol (VITAMIN D3) 25 MCG (1000 UT) CAPS Take 1 capsule by mouth daily.    . divalproex (DEPAKOTE) 250 MG EC tablet Take 250 mg by mouth daily.      Marland Kitchen GLOBAL EASE INJECT PEN NEEDLES 32G X 4 MM MISC See admin instructions.    . hydrALAZINE (APRESOLINE) 50 MG tablet Take 50 mg by mouth 3 (three) times daily.    Marland Kitchen HYDROcodone-acetaminophen (NORCO/VICODIN) 5-325 MG tablet Take 1 tablet by mouth 2 (two) times daily.    . isosorbide mononitrate (IMDUR) 60 MG 24 hr tablet Take 1.5 tablets (90 mg total) by mouth daily. 45 tablet 6  . meclizine (ANTIVERT) 25 MG tablet Take 25 mg by mouth as needed.     . metolazone (ZAROXOLYN) 5 MG tablet Take 5 mg by mouth as needed.    . Multiple Vitamin (MULTIVITAMIN) tablet Take 1 tablet by mouth daily.      Marland Kitchen NOVOLOG FLEXPEN 100 UNIT/ML FlexPen Inject into the skin.    . potassium chloride (KLOR-CON) 10 MEQ tablet Take 10 mEq by mouth daily as needed.    . rosuvastatin (CRESTOR) 10 MG tablet Take 10 mg by mouth daily.    . sertraline (ZOLOFT) 100 MG tablet Take 100 mg by mouth daily.      Marland Kitchen tiZANidine (ZANAFLEX) 4 MG tablet Take 4 mg by mouth at bedtime.    . torsemide (DEMADEX) 20 MG tablet  Take 80 mg by mouth every morning.    . traZODone (DESYREL) 100 MG tablet TAKE ONE TAB BY MOUTH AT BEDTIME  3  . TRESIBA FLEXTOUCH 200 UNIT/ML SOPN Inject 50 Units into the skin every morning.   2  . VICTOZA 18 MG/3ML  SOPN Inject 1.2 mg into the skin daily.    . hydrALAZINE (APRESOLINE) 25 MG tablet Take 1 tablet (25 mg total) by mouth 3 (three) times daily. 270 tablet 1  . nitroGLYCERIN (NITROLINGUAL) 0.4 MG/SPRAY spray Place 1 spray under the tongue every 5 (five) minutes x 3 doses as needed. (Patient not taking: Reported on 09/14/2019) 12 g 2   No current facility-administered medications for this visit.    ALLERGIES:  Allergies  Allergen Reactions  . Ace Inhibitors   . Lisinopril     Severe cough  . Penicillins   . Tetracycline   . Lamictal [Lamotrigine] Rash    Rash start inside to outter body.    PHYSICAL EXAM:  Performance status (ECOG): 1 - Symptomatic but completely ambulatory  Vitals:   09/14/19 1528  BP: (!) 168/56  Pulse: 61  Resp: 20  Temp: (!) 97.5 F (36.4 C)  SpO2: 94%   Wt Readings from Last 3 Encounters:  09/14/19 222 lb 14.4 oz (101.1 kg)  06/30/19 223 lb (101.2 kg)  03/03/19 237 lb 12.8 oz (107.9 kg)   Physical Exam Vitals reviewed.  Constitutional:      Appearance: Normal appearance. She is obese.  Cardiovascular:     Rate and Rhythm: Normal rate and regular rhythm.     Pulses: Normal pulses.     Heart sounds: Normal heart sounds.  Pulmonary:     Effort: Pulmonary effort is normal.     Breath sounds: Normal breath sounds.  Musculoskeletal:     Right lower leg: No edema.     Left lower leg: No edema.  Neurological:     General: No focal deficit present.     Mental Status: She is alert and oriented to person, place, and time.  Psychiatric:        Mood and Affect: Mood normal.        Behavior: Behavior normal.     LABORATORY DATA:  I have reviewed the labs as listed.  CBC Latest Ref Rng & Units 09/07/2019 06/23/2019 02/02/2019  WBC 4.0 -  10.5 K/uL 7.8 6.2 6.6  Hemoglobin 12.0 - 15.0 g/dL 11.9(L) 10.8(L) 11.2(L)  Hematocrit 36 - 46 % 37.7 34.6(L) 36.8  Platelets 150 - 400 K/uL 199 168 163   CMP Latest Ref Rng & Units 09/07/2019 06/23/2019 02/02/2019  Glucose 70 - 99 mg/dL 122(H) 256(H) 135(H)  BUN 8 - 23 mg/dL 62(H) 74(H) 42(H)  Creatinine 0.44 - 1.00 mg/dL 1.77(H) 2.07(H) 1.75(H)  Sodium 135 - 145 mmol/L 136 137 141  Potassium 3.5 - 5.1 mmol/L 4.6 4.5 5.5(H)  Chloride 98 - 111 mmol/L 98 97(L) 107  CO2 22 - 32 mmol/L $RemoveB'28 27 27  'SGZrZbTw$ Calcium 8.9 - 10.3 mg/dL 9.5 8.6(L) 8.7(L)  Total Protein 6.5 - 8.1 g/dL 7.4 6.8 7.0  Total Bilirubin 0.3 - 1.2 mg/dL 0.3 0.5 0.6  Alkaline Phos 38 - 126 U/L 45 47 54  AST 15 - 41 U/L $Remo'26 18 16  'WKOrN$ ALT 0 - 44 U/L $Remo'26 14 16      'Umybv$ Component Value Date/Time   RBC 3.68 (L) 09/07/2019 1342   MCV 102.4 (H) 09/07/2019 1342   MCH 32.3 09/07/2019 1342   MCHC 31.6 09/07/2019 1342   RDW 13.0 09/07/2019 1342   LYMPHSABS 1.9 09/07/2019 1342   MONOABS 0.6 09/07/2019 1342   EOSABS 0.6 (H) 09/07/2019 1342   BASOSABS 0.1 09/07/2019 1342   Lab Results  Component Value Date   LDH 189 09/07/2019  LDH 178 06/23/2019   LDH 209 (H) 11/08/2018   Lab Results  Component Value Date   VD25OH 45.49 09/07/2019   VD25OH 59.65 06/23/2019   VD25OH 31.74 11/08/2018   Lab Results  Component Value Date   TIBC 263 09/07/2019   TIBC 263 06/23/2019   TIBC 250 11/08/2018   FERRITIN 330 (H) 09/07/2019   FERRITIN 79 06/23/2019   FERRITIN 123 11/08/2018   IRONPCTSAT 33 (H) 09/07/2019   IRONPCTSAT 25 06/23/2019   IRONPCTSAT 30 11/08/2018   Lab Results  Component Value Date   TOTALPROTELP 6.4 09/07/2019   TOTALPROTELP 7.0 09/07/2019   ALBUMINELP 3.3 09/07/2019   A1GS 0.2 09/07/2019   A2GS 0.6 09/07/2019   BETS 0.9 09/07/2019   GAMS 1.4 09/07/2019   MSPIKE 0.7 (H) 09/07/2019   SPEI Comment 09/07/2019    Lab Results  Component Value Date   KPAFRELGTCHN 87.7 (H) 09/07/2019   LAMBDASER 46.4 (H) 09/07/2019    KAPLAMBRATIO 1.89 (H) 09/07/2019    DIAGNOSTIC IMAGING:  I have independently reviewed the scans and discussed with the patient. No results found.   ASSESSMENT:  1.  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  She was noted to have an elevated creatinine and was seen by nephrologist in December 2014.  She had an SPEP done as part of her work-up for kidney disease and was noted to have a M spike of 0.6 with normal lambda light chain ratio.  Immunofixation showed IgG kappa monoclonal protein.  Patient had hep C antibody which was negative, ANA negative and C ANCA negative, P-ANCA negative, anti-double-stranded DNA antibody negative and complements were within normal limits.  Her creatinine at that time was 1.32 and hemoglobin was 11.8.  Also she had no significant protein in her urine. -She also had a bone scan which was noted to be negative.  She never had a bone marrow biopsy done at this time. - Patient was being followed by Dr. Tressie Stalker at our clinic since early 2017. - Labs on 06/01/2018 shows potassium 5.1, creatinine 1.67, calcium 8.6, LFTs were normal, LDH 213, SPEP stable with M spike 0.6 g/dL.  WBC 7.8, hemoglobin 9.9, platelets 156.  Light chain ratio 1.82. -Labs on 07/05/2018 showed her potassium 5.5, creatinine 1.72, calcium 8.7, hemoglobin 10.1. -We repeated her potassium at her visit on 07/12/2018 which showed her potassium 4.8. - She reports no B symptoms including fevers, chills, night sweats, or unexplained weight loss. -Skeletal survey done on 07/05/2018 showed no lytic lesions. -Bone marrow biopsy done on 11/25/2018.  Pathology consistent with hypercellular marrow with trilineage hematopoiesis and 2% plasma cells.  FISH was negative.  Cytogenetics was normal. -When trending her anemia and renal function since 2017 it has progressively gotten worse.  Which is concerning for multiple myeloma.  Patient  was sent to [facility] for second opinion. -Bone marrow biopsy done by [facility] on 02/08/2019.  Pathology was consistent with normocellular bone marrow with trilineage hematopoiesis.  And 3% plasma cells.  FISH was negative.  Negative for amyloid deposits position by stain. -Cardiac MRI done at [facility] on 02/08/2019 showed the left ventricle moderately dilated in size with normal wall thickness.  Global systolic function is moderately reduced with a LVEF of 44%. [facility] referred her to a cardiologist in their system for a second opinion she sees this doctor on 03/06/2019.  She is following up with them closely. -Repeat  labs done on 06/23/2019 showed M spike of 0.8 g/dL, creatinine 2.07, hemoglobin 10.8, ferritin 79, percent saturation 25   2.  Anemia: -Likely from CKD and iron deficiency. -Last Feraheme on 07/07/2019 and 07/14/2019.  3.  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 -Follows up with nephrology.   PLAN:  1.  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  M spike is 0.7.  Creatinine is 1.7 and calcium 9.5. -Hemoglobin is 11.9.  Free kappa light chains are 87.7 and lambda light chains 46, ratio is 1.89. -As there is recent worsening of M spike, I have recommended follow-up in 4 months with repeat myeloma labs.  We will also repeat skeletal survey.  2.  Anemia: -This has improved with hemoglobin 11.9 after Feraheme infusion.  No further Feraheme needed.  3.  Renal insufficiency: -Creatinine on latest labs is 1.77, improved from 2.07 in June.   Orders placed this encounter:  No orders of the defined types were placed in this encounter.    Derek Jack, MD [place] [phone]   I, Milinda Antis, am acting as a scribe for  Dr. Sanda Linger.  I, Derek Jack MD, have reviewed the above documentation for accuracy and completeness, and I agree with the above.

## 2019-09-14 NOTE — Patient Instructions (Signed)
[facility] at [facility] Discharge Instructions  You were seen today by Dr. Delton Coombes. He went over your recent results. You will be scheduled for a bone scan before your next visit. Dr. Delton Coombes will see you back in 4 months for labs and follow up.   Thank you for choosing [facility] at [facility] to provide your oncology and hematology care.  To afford each patient quality time with our provider, please arrive at least 15 minutes before your scheduled appointment time.   If you have a lab appointment with the [facility] please come in thru the Main Entrance and check in at the main information desk  You need to re-schedule your appointment should you arrive 10 or more minutes late.  We strive to give you quality time with our providers, and arriving late affects you and other patients whose appointments are after yours.  Also, if you no show three or more times for appointments you may be dismissed from the clinic at the providers discretion.     Again, thank you for choosing [facility].  Our hope is that these requests will decrease the amount of time that you wait before being seen by our physicians.       _____________________________________________________________  Should you have questions after your visit to [facility], please contact our office at [phone] between the hours of 8:00 a.m. and 4:30 p.m.  Voicemails left after 4:00 p.m. will not be returned until the following business day.  For prescription refill requests, have your pharmacy contact our office and allow 72 hours.    Cancer Center Support Programs:   > Cancer Support Group  2nd Tuesday of the month 1pm-2pm, Journey Room

## 2019-09-18 DIAGNOSIS — S335XXA Sprain of ligaments of lumbar spine, initial encounter: Secondary | ICD-10-CM | POA: Diagnosis not present

## 2019-09-18 DIAGNOSIS — S134XXA Sprain of ligaments of cervical spine, initial encounter: Secondary | ICD-10-CM | POA: Diagnosis not present

## 2019-09-18 DIAGNOSIS — M9902 Segmental and somatic dysfunction of thoracic region: Secondary | ICD-10-CM | POA: Diagnosis not present

## 2019-09-18 DIAGNOSIS — M9901 Segmental and somatic dysfunction of cervical region: Secondary | ICD-10-CM | POA: Diagnosis not present

## 2019-09-18 DIAGNOSIS — M47816 Spondylosis without myelopathy or radiculopathy, lumbar region: Secondary | ICD-10-CM | POA: Diagnosis not present

## 2019-09-18 DIAGNOSIS — M9903 Segmental and somatic dysfunction of lumbar region: Secondary | ICD-10-CM | POA: Diagnosis not present

## 2019-09-19 DIAGNOSIS — I10 Essential (primary) hypertension: Secondary | ICD-10-CM | POA: Diagnosis not present

## 2019-09-19 DIAGNOSIS — E7800 Pure hypercholesterolemia, unspecified: Secondary | ICD-10-CM | POA: Diagnosis not present

## 2019-09-19 DIAGNOSIS — E119 Type 2 diabetes mellitus without complications: Secondary | ICD-10-CM | POA: Diagnosis not present

## 2019-09-19 DIAGNOSIS — I2510 Atherosclerotic heart disease of native coronary artery without angina pectoris: Secondary | ICD-10-CM | POA: Diagnosis not present

## 2019-09-21 DIAGNOSIS — M47816 Spondylosis without myelopathy or radiculopathy, lumbar region: Secondary | ICD-10-CM | POA: Diagnosis not present

## 2019-09-21 DIAGNOSIS — S134XXA Sprain of ligaments of cervical spine, initial encounter: Secondary | ICD-10-CM | POA: Diagnosis not present

## 2019-09-21 DIAGNOSIS — M9901 Segmental and somatic dysfunction of cervical region: Secondary | ICD-10-CM | POA: Diagnosis not present

## 2019-09-21 DIAGNOSIS — M9902 Segmental and somatic dysfunction of thoracic region: Secondary | ICD-10-CM | POA: Diagnosis not present

## 2019-09-21 DIAGNOSIS — S335XXA Sprain of ligaments of lumbar spine, initial encounter: Secondary | ICD-10-CM | POA: Diagnosis not present

## 2019-09-21 DIAGNOSIS — M9903 Segmental and somatic dysfunction of lumbar region: Secondary | ICD-10-CM | POA: Diagnosis not present

## 2019-09-27 DIAGNOSIS — M9902 Segmental and somatic dysfunction of thoracic region: Secondary | ICD-10-CM | POA: Diagnosis not present

## 2019-09-27 DIAGNOSIS — M47816 Spondylosis without myelopathy or radiculopathy, lumbar region: Secondary | ICD-10-CM | POA: Diagnosis not present

## 2019-09-27 DIAGNOSIS — M9901 Segmental and somatic dysfunction of cervical region: Secondary | ICD-10-CM | POA: Diagnosis not present

## 2019-09-27 DIAGNOSIS — M9903 Segmental and somatic dysfunction of lumbar region: Secondary | ICD-10-CM | POA: Diagnosis not present

## 2019-09-27 DIAGNOSIS — S335XXA Sprain of ligaments of lumbar spine, initial encounter: Secondary | ICD-10-CM | POA: Diagnosis not present

## 2019-09-27 DIAGNOSIS — S134XXA Sprain of ligaments of cervical spine, initial encounter: Secondary | ICD-10-CM | POA: Diagnosis not present

## 2019-10-02 DIAGNOSIS — S335XXA Sprain of ligaments of lumbar spine, initial encounter: Secondary | ICD-10-CM | POA: Diagnosis not present

## 2019-10-02 DIAGNOSIS — M47816 Spondylosis without myelopathy or radiculopathy, lumbar region: Secondary | ICD-10-CM | POA: Diagnosis not present

## 2019-10-02 DIAGNOSIS — S134XXA Sprain of ligaments of cervical spine, initial encounter: Secondary | ICD-10-CM | POA: Diagnosis not present

## 2019-10-02 DIAGNOSIS — M9902 Segmental and somatic dysfunction of thoracic region: Secondary | ICD-10-CM | POA: Diagnosis not present

## 2019-10-02 DIAGNOSIS — M9903 Segmental and somatic dysfunction of lumbar region: Secondary | ICD-10-CM | POA: Diagnosis not present

## 2019-10-02 DIAGNOSIS — M9901 Segmental and somatic dysfunction of cervical region: Secondary | ICD-10-CM | POA: Diagnosis not present

## 2019-10-05 DIAGNOSIS — M47816 Spondylosis without myelopathy or radiculopathy, lumbar region: Secondary | ICD-10-CM | POA: Diagnosis not present

## 2019-10-05 DIAGNOSIS — M9901 Segmental and somatic dysfunction of cervical region: Secondary | ICD-10-CM | POA: Diagnosis not present

## 2019-10-05 DIAGNOSIS — S134XXA Sprain of ligaments of cervical spine, initial encounter: Secondary | ICD-10-CM | POA: Diagnosis not present

## 2019-10-05 DIAGNOSIS — M9902 Segmental and somatic dysfunction of thoracic region: Secondary | ICD-10-CM | POA: Diagnosis not present

## 2019-10-05 DIAGNOSIS — M9903 Segmental and somatic dysfunction of lumbar region: Secondary | ICD-10-CM | POA: Diagnosis not present

## 2019-10-05 DIAGNOSIS — S335XXA Sprain of ligaments of lumbar spine, initial encounter: Secondary | ICD-10-CM | POA: Diagnosis not present

## 2019-10-09 DIAGNOSIS — M9903 Segmental and somatic dysfunction of lumbar region: Secondary | ICD-10-CM | POA: Diagnosis not present

## 2019-10-09 DIAGNOSIS — M9901 Segmental and somatic dysfunction of cervical region: Secondary | ICD-10-CM | POA: Diagnosis not present

## 2019-10-09 DIAGNOSIS — Z23 Encounter for immunization: Secondary | ICD-10-CM | POA: Diagnosis not present

## 2019-10-09 DIAGNOSIS — S335XXA Sprain of ligaments of lumbar spine, initial encounter: Secondary | ICD-10-CM | POA: Diagnosis not present

## 2019-10-09 DIAGNOSIS — M47816 Spondylosis without myelopathy or radiculopathy, lumbar region: Secondary | ICD-10-CM | POA: Diagnosis not present

## 2019-10-09 DIAGNOSIS — M9902 Segmental and somatic dysfunction of thoracic region: Secondary | ICD-10-CM | POA: Diagnosis not present

## 2019-10-09 DIAGNOSIS — S134XXA Sprain of ligaments of cervical spine, initial encounter: Secondary | ICD-10-CM | POA: Diagnosis not present

## 2019-10-11 DIAGNOSIS — M160 Bilateral primary osteoarthritis of hip: Secondary | ICD-10-CM | POA: Diagnosis not present

## 2019-10-11 DIAGNOSIS — M25552 Pain in left hip: Secondary | ICD-10-CM | POA: Diagnosis not present

## 2019-10-11 DIAGNOSIS — M545 Low back pain: Secondary | ICD-10-CM | POA: Diagnosis not present

## 2019-10-11 DIAGNOSIS — I5032 Chronic diastolic (congestive) heart failure: Secondary | ICD-10-CM | POA: Diagnosis not present

## 2019-10-11 DIAGNOSIS — R1310 Dysphagia, unspecified: Secondary | ICD-10-CM | POA: Diagnosis not present

## 2019-10-11 DIAGNOSIS — Z299 Encounter for prophylactic measures, unspecified: Secondary | ICD-10-CM | POA: Diagnosis not present

## 2019-10-11 DIAGNOSIS — M25551 Pain in right hip: Secondary | ICD-10-CM | POA: Diagnosis not present

## 2019-10-11 DIAGNOSIS — M549 Dorsalgia, unspecified: Secondary | ICD-10-CM | POA: Diagnosis not present

## 2019-10-13 DIAGNOSIS — E113391 Type 2 diabetes mellitus with moderate nonproliferative diabetic retinopathy without macular edema, right eye: Secondary | ICD-10-CM | POA: Diagnosis not present

## 2019-10-17 DIAGNOSIS — E1140 Type 2 diabetes mellitus with diabetic neuropathy, unspecified: Secondary | ICD-10-CM | POA: Diagnosis not present

## 2019-10-17 DIAGNOSIS — L110 Acquired keratosis follicularis: Secondary | ICD-10-CM | POA: Diagnosis not present

## 2019-10-17 DIAGNOSIS — L609 Nail disorder, unspecified: Secondary | ICD-10-CM | POA: Diagnosis not present

## 2019-10-18 ENCOUNTER — Encounter: Payer: Self-pay | Admitting: Internal Medicine

## 2019-10-18 DIAGNOSIS — M549 Dorsalgia, unspecified: Secondary | ICD-10-CM | POA: Diagnosis not present

## 2019-10-18 DIAGNOSIS — E1165 Type 2 diabetes mellitus with hyperglycemia: Secondary | ICD-10-CM | POA: Diagnosis not present

## 2019-10-18 DIAGNOSIS — S134XXA Sprain of ligaments of cervical spine, initial encounter: Secondary | ICD-10-CM | POA: Diagnosis not present

## 2019-10-18 DIAGNOSIS — Z299 Encounter for prophylactic measures, unspecified: Secondary | ICD-10-CM | POA: Diagnosis not present

## 2019-10-18 DIAGNOSIS — S335XXA Sprain of ligaments of lumbar spine, initial encounter: Secondary | ICD-10-CM | POA: Diagnosis not present

## 2019-10-18 DIAGNOSIS — M9903 Segmental and somatic dysfunction of lumbar region: Secondary | ICD-10-CM | POA: Diagnosis not present

## 2019-10-18 DIAGNOSIS — M9901 Segmental and somatic dysfunction of cervical region: Secondary | ICD-10-CM | POA: Diagnosis not present

## 2019-10-18 DIAGNOSIS — M9902 Segmental and somatic dysfunction of thoracic region: Secondary | ICD-10-CM | POA: Diagnosis not present

## 2019-10-18 DIAGNOSIS — I10 Essential (primary) hypertension: Secondary | ICD-10-CM | POA: Diagnosis not present

## 2019-10-18 DIAGNOSIS — M47816 Spondylosis without myelopathy or radiculopathy, lumbar region: Secondary | ICD-10-CM | POA: Diagnosis not present

## 2019-10-18 DIAGNOSIS — Z6841 Body Mass Index (BMI) 40.0 and over, adult: Secondary | ICD-10-CM | POA: Diagnosis not present

## 2019-10-18 DIAGNOSIS — I509 Heart failure, unspecified: Secondary | ICD-10-CM | POA: Diagnosis not present

## 2019-10-19 DIAGNOSIS — E119 Type 2 diabetes mellitus without complications: Secondary | ICD-10-CM | POA: Diagnosis not present

## 2019-10-19 DIAGNOSIS — I2510 Atherosclerotic heart disease of native coronary artery without angina pectoris: Secondary | ICD-10-CM | POA: Diagnosis not present

## 2019-10-19 DIAGNOSIS — I10 Essential (primary) hypertension: Secondary | ICD-10-CM | POA: Diagnosis not present

## 2019-10-19 DIAGNOSIS — E7800 Pure hypercholesterolemia, unspecified: Secondary | ICD-10-CM | POA: Diagnosis not present

## 2019-10-24 DIAGNOSIS — F411 Generalized anxiety disorder: Secondary | ICD-10-CM | POA: Diagnosis not present

## 2019-10-24 DIAGNOSIS — F4310 Post-traumatic stress disorder, unspecified: Secondary | ICD-10-CM | POA: Diagnosis not present

## 2019-10-24 DIAGNOSIS — F3112 Bipolar disorder, current episode manic without psychotic features, moderate: Secondary | ICD-10-CM | POA: Diagnosis not present

## 2019-10-25 DIAGNOSIS — M9901 Segmental and somatic dysfunction of cervical region: Secondary | ICD-10-CM | POA: Diagnosis not present

## 2019-10-25 DIAGNOSIS — S335XXA Sprain of ligaments of lumbar spine, initial encounter: Secondary | ICD-10-CM | POA: Diagnosis not present

## 2019-10-25 DIAGNOSIS — M9903 Segmental and somatic dysfunction of lumbar region: Secondary | ICD-10-CM | POA: Diagnosis not present

## 2019-10-25 DIAGNOSIS — S134XXA Sprain of ligaments of cervical spine, initial encounter: Secondary | ICD-10-CM | POA: Diagnosis not present

## 2019-10-25 DIAGNOSIS — M47816 Spondylosis without myelopathy or radiculopathy, lumbar region: Secondary | ICD-10-CM | POA: Diagnosis not present

## 2019-10-25 DIAGNOSIS — M9902 Segmental and somatic dysfunction of thoracic region: Secondary | ICD-10-CM | POA: Diagnosis not present

## 2019-10-26 ENCOUNTER — Encounter: Payer: Self-pay | Admitting: Internal Medicine

## 2019-10-26 ENCOUNTER — Ambulatory Visit (INDEPENDENT_AMBULATORY_CARE_PROVIDER_SITE_OTHER): Payer: Medicare Other | Admitting: Internal Medicine

## 2019-10-26 ENCOUNTER — Telehealth: Payer: Self-pay | Admitting: *Deleted

## 2019-10-26 ENCOUNTER — Other Ambulatory Visit: Payer: Self-pay

## 2019-10-26 DIAGNOSIS — R1319 Other dysphagia: Secondary | ICD-10-CM | POA: Diagnosis not present

## 2019-10-26 DIAGNOSIS — I5032 Chronic diastolic (congestive) heart failure: Secondary | ICD-10-CM | POA: Diagnosis not present

## 2019-10-26 DIAGNOSIS — N170 Acute kidney failure with tubular necrosis: Secondary | ICD-10-CM | POA: Diagnosis not present

## 2019-10-26 DIAGNOSIS — N189 Chronic kidney disease, unspecified: Secondary | ICD-10-CM | POA: Diagnosis not present

## 2019-10-26 DIAGNOSIS — E875 Hyperkalemia: Secondary | ICD-10-CM | POA: Diagnosis not present

## 2019-10-26 DIAGNOSIS — R809 Proteinuria, unspecified: Secondary | ICD-10-CM | POA: Diagnosis not present

## 2019-10-26 DIAGNOSIS — Z1211 Encounter for screening for malignant neoplasm of colon: Secondary | ICD-10-CM | POA: Diagnosis not present

## 2019-10-26 DIAGNOSIS — R1310 Dysphagia, unspecified: Secondary | ICD-10-CM | POA: Insufficient documentation

## 2019-10-26 NOTE — Progress Notes (Signed)
Primary Care Physician:  Monico Blitz, MD Primary Gastroenterologist:  Dr. Abbey Chatters  Chief Complaint  Patient presents with  . Dysphagia    going on for a while, trouble with solids    HPI:   Christy [name] is a 69 y.o. female who presents to the clinic today by referral from her PCP Dr. Brigitte Pulse for evaluation.  She states she has had issues with dysphagia for years but recently this is progressively worsening.  Feels as though solids will "get stuck" "in her substernal region.  At times she will have to regurgitate the food.  No esophageal food bolus in the past.  No previous upper endoscopy.  Denies any acid reflux or heartburn.  Does not currently take anything for acid.  No history of PUD or H. pylori.  No chronic NSAID use. No family history of colorectal malignancy.  Patient states her last colonoscopy was greater than 10 years ago.  Past Medical History:  Diagnosis Date  . Bipolar disorder ([place])   . Coronary atherosclerosis of native coronary artery    BMS circ 2006, 70% PDA 2009  . Essential hypertension   . Mixed hyperlipidemia   . OSA on CPAP   . Renal tubular acidosis, type IV   . SVT (supraventricular tachycardia) ([place])   . Type 2 diabetes mellitus ([place])     Past Surgical History:  Procedure Laterality Date  . Acromioclavicular arthritis    . CARDIAC CATHETERIZATION N/A 03/29/2015   Procedure: Left Heart Cath and Coronary Angiography;  Surgeon: Troy Sine, MD;  Location: [facility];  Service: Cardiovascular;  Laterality: N/A;  . RIght shoulder adhesive capsulitis    . Rotator cuff impingement syndrome    . Rotator cuff tear      Current Outpatient Medications  Medication Sig Dispense Refill  . allopurinol (ZYLOPRIM) 300 MG tablet Take 150 mg by mouth daily.    Marland Kitchen amLODipine (NORVASC) 5 MG tablet Take 5 mg by mouth daily.    Marland Kitchen aspirin 81 MG EC tablet Take 81 mg by mouth daily.      Marland Kitchen BESIVANCE 0.6 % SUSP INSTILL ONE DROP IN THE LEFT EYE FOUR TIMES DAILY  FOR 2 DAYS AFTER EACH MONTHLY EYE INJECTION  12  . buPROPion (WELLBUTRIN XL) 300 MG 24 hr tablet Take 300 mg by mouth daily.    . calcium carbonate (OS-CAL) 600 MG TABS Take 600 mg by mouth daily.      . carvedilol (COREG) 12.5 MG tablet Take 12.5 mg by mouth 2 (two) times daily with a meal.    . Cholecalciferol (VITAMIN D3) 25 MCG (1000 UT) CAPS Take 1 capsule by mouth daily.    . divalproex (DEPAKOTE) 250 MG EC tablet Take 250 mg by mouth daily.      . hydrALAZINE (APRESOLINE) 50 MG tablet Take 50 mg by mouth 3 (three) times daily.    Marland Kitchen HYDROcodone-acetaminophen (NORCO/VICODIN) 5-325 MG tablet Take 1 tablet by mouth 2 (two) times daily.    . isosorbide mononitrate (IMDUR) 60 MG 24 hr tablet Take 1.5 tablets (90 mg total) by mouth daily. 45 tablet 6  . losartan (COZAAR) 25 MG tablet Take 25 mg by mouth daily.    . meclizine (ANTIVERT) 25 MG tablet Take 25 mg by mouth as needed.     . metolazone (ZAROXOLYN) 5 MG tablet Take 5 mg by mouth as needed.    . Multiple Vitamin (MULTIVITAMIN) tablet Take 1 tablet by mouth daily.      Marland Kitchen  nitroGLYCERIN (NITROLINGUAL) 0.4 MG/SPRAY spray Place 1 spray under the tongue every 5 (five) minutes x 3 doses as needed. 12 g 2  . NOVOLOG FLEXPEN 100 UNIT/ML FlexPen Inject into the skin. Sliding scale    . potassium chloride (KLOR-CON) 10 MEQ tablet Take 10 mEq by mouth daily as needed.    . rosuvastatin (CRESTOR) 10 MG tablet Take 10 mg by mouth daily.    . sertraline (ZOLOFT) 100 MG tablet Take 100 mg by mouth daily.      Marland Kitchen torsemide (DEMADEX) 20 MG tablet Take 80 mg by mouth every morning.    . traZODone (DESYREL) 100 MG tablet TAKE ONE TAB BY MOUTH AT BEDTIME  3  . TRESIBA FLEXTOUCH 200 UNIT/ML SOPN Inject 54 Units into the skin every morning.   2  . VICTOZA 18 MG/3ML SOPN Inject 1.2 mg into the skin daily.    Marland Kitchen GLOBAL EASE INJECT PEN NEEDLES 32G X 4 MM MISC See admin instructions.     No current facility-administered medications for this visit.    Allergies  as of 10/26/2019 - Review Complete 10/26/2019  Allergen Reaction Noted  . Ace inhibitors  08/27/2010  . Lisinopril  09/25/2015  . Penicillins  12/14/2007  . Tetracycline  12/14/2007  . Lamictal [lamotrigine] Rash 08/27/2010    Family History  Problem Relation Age of Onset  . Bipolar disorder Brother   . Diabetes Father     Social History   Socioeconomic History  . Marital status: Divorced    Spouse name: Not on file  . Number of children: Not on file  . Years of education: Not on file  . Highest education level: Not on file  Occupational History  . Occupation: PLANNING AND INSPECTOR    Employer: [employer]  Tobacco Use  . Smoking status: Former Smoker    Packs/day: 1.00    Years: 20.00    Pack years: 20.00    Types: Cigarettes    Start date: 10/30/1984    Quit date: 07/08/2013    Years since quitting: 6.3  . Smokeless tobacco: Never Used  Substance and Sexual Activity  . Alcohol use: No    Alcohol/week: 0.0 standard drinks  . Drug use: No  . Sexual activity: Not on file  Other Topics Concern  . Not on file  Social History Narrative   She has lived with some of her daughters. Works part-time for the city.    Social Determinants of Health   Financial Resource Strain:   . Difficulty of Paying Living Expenses: Not on file  Food Insecurity:   . Worried About Charity fundraiser in the Last Year: Not on file  . Ran Out of Food in the Last Year: Not on file  Transportation Needs:   . Lack of Transportation (Medical): Not on file  . Lack of Transportation (Non-Medical): Not on file  Physical Activity:   . Days of Exercise per Week: Not on file  . Minutes of Exercise per Session: Not on file  Stress:   . Feeling of Stress : Not on file  Social Connections:   . Frequency of Communication with Friends and Family: Not on file  . Frequency of Social Gatherings with Friends and Family: Not on file  . Attends Religious Services: Not on file  . Active Member of Clubs  or Organizations: Not on file  . Attends Archivist Meetings: Not on file  . Marital Status: Not on file  Intimate Partner  Violence:   . Fear of Current or Ex-Partner: Not on file  . Emotionally Abused: Not on file  . Physically Abused: Not on file  . Sexually Abused: Not on file    Subjective: Review of Systems  Constitutional: Negative for chills and fever.  HENT: Negative for congestion and hearing loss.   Eyes: Negative for blurred vision and double vision.  Respiratory: Negative for cough and shortness of breath.   Cardiovascular: Negative for chest pain and palpitations.  Gastrointestinal: Negative for abdominal pain, blood in stool, constipation, diarrhea, heartburn, melena and vomiting.       Dysphagia  Genitourinary: Negative for dysuria and urgency.  Musculoskeletal: Negative for joint pain and myalgias.  Skin: Negative for itching and rash.  Neurological: Negative for dizziness and headaches.  Psychiatric/Behavioral: Negative for depression. The patient is not nervous/anxious.        Objective: BP (!) 156/60   Pulse (!) 56   Temp (!) 97.3 F (36.3 C) (Oral)   Ht 5\' 3"  (1.6 m)   Wt 221 lb 6.4 oz (100.4 kg)   BMI 39.22 kg/m  Physical Exam Constitutional:      Appearance: Normal appearance.  HENT:     Head: Normocephalic and atraumatic.  Eyes:     Extraocular Movements: Extraocular movements intact.     Conjunctiva/sclera: Conjunctivae normal.  Cardiovascular:     Rate and Rhythm: Normal rate and regular rhythm.  Pulmonary:     Effort: Pulmonary effort is normal.     Breath sounds: Normal breath sounds.  Abdominal:     General: Bowel sounds are normal.     Palpations: Abdomen is soft.  Musculoskeletal:        General: No swelling. Normal range of motion.     Cervical back: Normal range of motion and neck supple.  Skin:    General: Skin is warm and dry.     Coloration: Skin is not jaundiced.  Neurological:     General: No focal deficit  present.     Mental Status: She is alert and oriented to person, place, and time.  Psychiatric:        Mood and Affect: Mood normal.        Behavior: Behavior normal.      Assessment: *Dysphagia-worsening, mainly with solids *Colon cancer screening  Plan: Etiology of patient's dysphagia unclear, Will schedule for EGD to evaluate for peptic ulcer disease, esophagitis, gastritis, H. Pylori, duodenitis, or other. Will also evaluate for esophageal stricture, Schatzki's ring, esophageal web or other.   At the same time we will perform colonoscopy for colon cancer screening purposes.  The risks including infection, bleed, or perforation as well as benefits, limitations, alternatives and imponderables have been reviewed with the patient. Potential for esophageal dilation, biopsy, etc. have also been reviewed.  Questions have been answered. All parties agreeable.   10/26/2019 3:48 PM   Disclaimer: This note was dictated with voice recognition software. Similar sounding words can inadvertently be transcribed and may not be corrected upon review.

## 2019-10-26 NOTE — Telephone Encounter (Signed)
LMOVM to call back to schedule TCS/EGD +/-dil with Dr. Abbey Chatters, ASA 3

## 2019-10-26 NOTE — Patient Instructions (Signed)
We will schedule you for EGD with possible dilation and screening colonoscopy today in clinic.  Further recommendations to follow.  At [facility] we value your feedback. You may receive a survey about your visit today. Please share your experience as we strive to create trusting relationships with our patients to provide genuine, compassionate, quality care.  We appreciate your understanding and patience as we review any laboratory studies, imaging, and other diagnostic tests that are ordered as we care for you. Our office policy is 5 business days for review of these results, and any emergent or urgent results are addressed in a timely manner for your best interest. If you do not hear from our office in 1 week, please contact us.   We also encourage the use of MyChart, which contains your medical information for your review as well. If you are not enrolled in this feature, an access code is on this after visit summary for your convenience. Thank you for allowing [place] to be involved in your care.  It was great to see you today!  I hope you have a great rest of your fall!!    Dione Mccombie K. Abbey Chatters, D.O. Gastroenterology and Hepatology [facility]

## 2019-10-27 NOTE — Telephone Encounter (Signed)
LMOVM

## 2019-10-30 ENCOUNTER — Encounter: Payer: Self-pay | Admitting: *Deleted

## 2019-10-30 ENCOUNTER — Telehealth: Payer: Self-pay | Admitting: Internal Medicine

## 2019-10-30 NOTE — Telephone Encounter (Signed)
SEE PRIOR NOTE 

## 2019-10-30 NOTE — Telephone Encounter (Signed)
calld pt. She has been scheduled for 11/23 at 2:30pm. Aware will mail prep instructions with pre-op/covid test appt.

## 2019-10-30 NOTE — Telephone Encounter (Signed)
Pt returning call. [phone]

## 2019-10-31 DIAGNOSIS — Z79899 Other long term (current) drug therapy: Secondary | ICD-10-CM | POA: Diagnosis not present

## 2019-10-31 DIAGNOSIS — N170 Acute kidney failure with tubular necrosis: Secondary | ICD-10-CM | POA: Diagnosis not present

## 2019-11-01 DIAGNOSIS — I129 Hypertensive chronic kidney disease with stage 1 through stage 4 chronic kidney disease, or unspecified chronic kidney disease: Secondary | ICD-10-CM | POA: Diagnosis not present

## 2019-11-01 DIAGNOSIS — E1122 Type 2 diabetes mellitus with diabetic chronic kidney disease: Secondary | ICD-10-CM | POA: Diagnosis not present

## 2019-11-01 DIAGNOSIS — R809 Proteinuria, unspecified: Secondary | ICD-10-CM | POA: Diagnosis not present

## 2019-11-01 DIAGNOSIS — E1129 Type 2 diabetes mellitus with other diabetic kidney complication: Secondary | ICD-10-CM | POA: Diagnosis not present

## 2019-11-01 DIAGNOSIS — N189 Chronic kidney disease, unspecified: Secondary | ICD-10-CM | POA: Diagnosis not present

## 2019-11-01 DIAGNOSIS — N170 Acute kidney failure with tubular necrosis: Secondary | ICD-10-CM | POA: Diagnosis not present

## 2019-11-01 DIAGNOSIS — I5032 Chronic diastolic (congestive) heart failure: Secondary | ICD-10-CM | POA: Diagnosis not present

## 2019-11-02 DIAGNOSIS — M9903 Segmental and somatic dysfunction of lumbar region: Secondary | ICD-10-CM | POA: Diagnosis not present

## 2019-11-02 DIAGNOSIS — S335XXA Sprain of ligaments of lumbar spine, initial encounter: Secondary | ICD-10-CM | POA: Diagnosis not present

## 2019-11-02 DIAGNOSIS — M9902 Segmental and somatic dysfunction of thoracic region: Secondary | ICD-10-CM | POA: Diagnosis not present

## 2019-11-02 DIAGNOSIS — M47816 Spondylosis without myelopathy or radiculopathy, lumbar region: Secondary | ICD-10-CM | POA: Diagnosis not present

## 2019-11-02 DIAGNOSIS — S134XXA Sprain of ligaments of cervical spine, initial encounter: Secondary | ICD-10-CM | POA: Diagnosis not present

## 2019-11-02 DIAGNOSIS — M9901 Segmental and somatic dysfunction of cervical region: Secondary | ICD-10-CM | POA: Diagnosis not present

## 2019-11-17 DIAGNOSIS — I471 Supraventricular tachycardia: Secondary | ICD-10-CM | POA: Diagnosis not present

## 2019-11-17 DIAGNOSIS — E119 Type 2 diabetes mellitus without complications: Secondary | ICD-10-CM | POA: Diagnosis not present

## 2019-11-17 DIAGNOSIS — I509 Heart failure, unspecified: Secondary | ICD-10-CM | POA: Diagnosis not present

## 2019-11-17 DIAGNOSIS — Z299 Encounter for prophylactic measures, unspecified: Secondary | ICD-10-CM | POA: Diagnosis not present

## 2019-11-17 DIAGNOSIS — M25559 Pain in unspecified hip: Secondary | ICD-10-CM | POA: Diagnosis not present

## 2019-11-17 DIAGNOSIS — I10 Essential (primary) hypertension: Secondary | ICD-10-CM | POA: Diagnosis not present

## 2019-11-17 DIAGNOSIS — E1165 Type 2 diabetes mellitus with hyperglycemia: Secondary | ICD-10-CM | POA: Diagnosis not present

## 2019-11-17 DIAGNOSIS — E7800 Pure hypercholesterolemia, unspecified: Secondary | ICD-10-CM | POA: Diagnosis not present

## 2019-11-17 DIAGNOSIS — I2510 Atherosclerotic heart disease of native coronary artery without angina pectoris: Secondary | ICD-10-CM | POA: Diagnosis not present

## 2019-11-18 DIAGNOSIS — I10 Essential (primary) hypertension: Secondary | ICD-10-CM | POA: Diagnosis not present

## 2019-12-05 DIAGNOSIS — Z299 Encounter for prophylactic measures, unspecified: Secondary | ICD-10-CM | POA: Diagnosis not present

## 2019-12-05 DIAGNOSIS — I509 Heart failure, unspecified: Secondary | ICD-10-CM | POA: Diagnosis not present

## 2019-12-05 DIAGNOSIS — E1165 Type 2 diabetes mellitus with hyperglycemia: Secondary | ICD-10-CM | POA: Diagnosis not present

## 2019-12-05 DIAGNOSIS — Z6841 Body Mass Index (BMI) 40.0 and over, adult: Secondary | ICD-10-CM | POA: Diagnosis not present

## 2019-12-05 DIAGNOSIS — E1142 Type 2 diabetes mellitus with diabetic polyneuropathy: Secondary | ICD-10-CM | POA: Diagnosis not present

## 2019-12-05 DIAGNOSIS — E1122 Type 2 diabetes mellitus with diabetic chronic kidney disease: Secondary | ICD-10-CM | POA: Diagnosis not present

## 2019-12-11 ENCOUNTER — Other Ambulatory Visit: Payer: Self-pay

## 2019-12-11 ENCOUNTER — Other Ambulatory Visit (HOSPITAL_COMMUNITY)
Admission: RE | Admit: 2019-12-11 | Discharge: 2019-12-11 | Disposition: A | Discharge status: Home / Self Care | Payer: Medicare Other | Source: Ambulatory Visit | Attending: Internal Medicine | Admitting: Internal Medicine

## 2019-12-11 ENCOUNTER — Encounter (HOSPITAL_COMMUNITY)
Admission: RE | Admit: 2019-12-11 | Discharge: 2019-12-11 | Disposition: A | Discharge status: Home / Self Care | Payer: Medicare Other | Source: Ambulatory Visit | Attending: Internal Medicine | Admitting: Internal Medicine

## 2019-12-11 DIAGNOSIS — Z01812 Encounter for preprocedural laboratory examination: Secondary | ICD-10-CM | POA: Diagnosis not present

## 2019-12-11 DIAGNOSIS — Z20822 Contact with and (suspected) exposure to covid-19: Secondary | ICD-10-CM | POA: Insufficient documentation

## 2019-12-11 LAB — SARS CORONAVIRUS 2 (TAT 6-24 HRS): SARS Coronavirus 2: NEGATIVE

## 2019-12-12 ENCOUNTER — Encounter (HOSPITAL_COMMUNITY)
Admission: RE | Disposition: A | Discharge status: Home / Self Care | Payer: Self-pay | Source: Home / Self Care | Attending: Internal Medicine

## 2019-12-12 ENCOUNTER — Encounter (HOSPITAL_COMMUNITY): Payer: Self-pay

## 2019-12-12 ENCOUNTER — Ambulatory Visit (HOSPITAL_COMMUNITY): Payer: Medicare Other | Admitting: Anesthesiology

## 2019-12-12 ENCOUNTER — Ambulatory Visit (HOSPITAL_COMMUNITY)
Admission: RE | Admit: 2019-12-12 | Discharge: 2019-12-12 | Disposition: A | Discharge status: Home / Self Care | Payer: Medicare Other | Attending: Internal Medicine | Admitting: Internal Medicine

## 2019-12-12 DIAGNOSIS — Z818 Family history of other mental and behavioral disorders: Secondary | ICD-10-CM | POA: Insufficient documentation

## 2019-12-12 DIAGNOSIS — K259 Gastric ulcer, unspecified as acute or chronic, without hemorrhage or perforation: Secondary | ICD-10-CM | POA: Insufficient documentation

## 2019-12-12 DIAGNOSIS — Z888 Allergy status to other drugs, medicaments and biological substances status: Secondary | ICD-10-CM | POA: Diagnosis not present

## 2019-12-12 DIAGNOSIS — K2970 Gastritis, unspecified, without bleeding: Secondary | ICD-10-CM | POA: Diagnosis not present

## 2019-12-12 DIAGNOSIS — G4730 Sleep apnea, unspecified: Secondary | ICD-10-CM | POA: Diagnosis not present

## 2019-12-12 DIAGNOSIS — Z880 Allergy status to penicillin: Secondary | ICD-10-CM | POA: Diagnosis not present

## 2019-12-12 DIAGNOSIS — D123 Benign neoplasm of transverse colon: Secondary | ICD-10-CM | POA: Insufficient documentation

## 2019-12-12 DIAGNOSIS — K319 Disease of stomach and duodenum, unspecified: Secondary | ICD-10-CM | POA: Insufficient documentation

## 2019-12-12 DIAGNOSIS — Z7982 Long term (current) use of aspirin: Secondary | ICD-10-CM | POA: Insufficient documentation

## 2019-12-12 DIAGNOSIS — K635 Polyp of colon: Secondary | ICD-10-CM

## 2019-12-12 DIAGNOSIS — I10 Essential (primary) hypertension: Secondary | ICD-10-CM | POA: Insufficient documentation

## 2019-12-12 DIAGNOSIS — I2510 Atherosclerotic heart disease of native coronary artery without angina pectoris: Secondary | ICD-10-CM | POA: Insufficient documentation

## 2019-12-12 DIAGNOSIS — I471 Supraventricular tachycardia: Secondary | ICD-10-CM | POA: Diagnosis not present

## 2019-12-12 DIAGNOSIS — K2990 Gastroduodenitis, unspecified, without bleeding: Secondary | ICD-10-CM | POA: Diagnosis not present

## 2019-12-12 DIAGNOSIS — Z87891 Personal history of nicotine dependence: Secondary | ICD-10-CM | POA: Diagnosis not present

## 2019-12-12 DIAGNOSIS — K2980 Duodenitis without bleeding: Secondary | ICD-10-CM | POA: Insufficient documentation

## 2019-12-12 DIAGNOSIS — G4733 Obstructive sleep apnea (adult) (pediatric): Secondary | ICD-10-CM | POA: Insufficient documentation

## 2019-12-12 DIAGNOSIS — R1310 Dysphagia, unspecified: Secondary | ICD-10-CM

## 2019-12-12 DIAGNOSIS — Z881 Allergy status to other antibiotic agents status: Secondary | ICD-10-CM | POA: Diagnosis not present

## 2019-12-12 DIAGNOSIS — E119 Type 2 diabetes mellitus without complications: Secondary | ICD-10-CM | POA: Diagnosis not present

## 2019-12-12 DIAGNOSIS — Z1211 Encounter for screening for malignant neoplasm of colon: Secondary | ICD-10-CM | POA: Insufficient documentation

## 2019-12-12 DIAGNOSIS — Z833 Family history of diabetes mellitus: Secondary | ICD-10-CM | POA: Insufficient documentation

## 2019-12-12 DIAGNOSIS — I340 Nonrheumatic mitral (valve) insufficiency: Secondary | ICD-10-CM | POA: Insufficient documentation

## 2019-12-12 DIAGNOSIS — Z794 Long term (current) use of insulin: Secondary | ICD-10-CM | POA: Diagnosis not present

## 2019-12-12 DIAGNOSIS — Z79899 Other long term (current) drug therapy: Secondary | ICD-10-CM | POA: Diagnosis not present

## 2019-12-12 DIAGNOSIS — K5730 Diverticulosis of large intestine without perforation or abscess without bleeding: Secondary | ICD-10-CM | POA: Insufficient documentation

## 2019-12-12 DIAGNOSIS — K222 Esophageal obstruction: Secondary | ICD-10-CM | POA: Diagnosis not present

## 2019-12-12 DIAGNOSIS — D649 Anemia, unspecified: Secondary | ICD-10-CM | POA: Diagnosis not present

## 2019-12-12 DIAGNOSIS — E782 Mixed hyperlipidemia: Secondary | ICD-10-CM | POA: Diagnosis not present

## 2019-12-12 DIAGNOSIS — K648 Other hemorrhoids: Secondary | ICD-10-CM | POA: Insufficient documentation

## 2019-12-12 DIAGNOSIS — K3189 Other diseases of stomach and duodenum: Secondary | ICD-10-CM | POA: Diagnosis not present

## 2019-12-12 HISTORY — PX: BALLOON DILATION: SHX5330

## 2019-12-12 HISTORY — PX: POLYPECTOMY: SHX5525

## 2019-12-12 HISTORY — PX: COLONOSCOPY WITH PROPOFOL: SHX5780

## 2019-12-12 HISTORY — PX: ESOPHAGOGASTRODUODENOSCOPY (EGD) WITH PROPOFOL: SHX5813

## 2019-12-12 HISTORY — PX: BIOPSY: SHX5522

## 2019-12-12 LAB — GLUCOSE, CAPILLARY: Glucose-Capillary: 71 mg/dL (ref 70–99)

## 2019-12-12 SURGERY — COLONOSCOPY WITH PROPOFOL
Anesthesia: General

## 2019-12-12 MED ORDER — CHLORHEXIDINE GLUCONATE CLOTH 2 % EX PADS: 6.0000 | MEDICATED_PAD | Freq: Once | CUTANEOUS | Status: DC

## 2019-12-12 MED ORDER — LIDOCAINE HCL (CARDIAC) PF 100 MG/5ML IV SOSY
PREFILLED_SYRINGE | INTRAVENOUS | Status: DC | PRN
  Administered 2019-12-12: 40 mg via INTRAVENOUS

## 2019-12-12 MED ORDER — LIDOCAINE VISCOUS HCL 2 % MT SOLN
OROMUCOSAL | Status: AC
  Filled 2019-12-12: qty 15

## 2019-12-12 MED ORDER — STERILE WATER FOR IRRIGATION IR SOLN
Status: DC | PRN
  Administered 2019-12-12: 1.5 mL

## 2019-12-12 MED ORDER — LACTATED RINGERS IV SOLN: Freq: Once | INTRAVENOUS | Status: AC

## 2019-12-12 MED ORDER — PROPOFOL 10 MG/ML IV BOLUS
INTRAVENOUS | Status: DC | PRN
  Administered 2019-12-12: 150 ug/kg/min via INTRAVENOUS
  Administered 2019-12-12: 100 mg via INTRAVENOUS

## 2019-12-12 MED ORDER — LIDOCAINE VISCOUS HCL 2 % MT SOLN
15.0000 mL | Freq: Once | OROMUCOSAL | Status: AC
  Administered 2019-12-12: 15 mL via OROMUCOSAL

## 2019-12-12 MED ORDER — LACTATED RINGERS IV SOLN: INTRAVENOUS | Status: DC | PRN

## 2019-12-12 MED ORDER — GLYCOPYRROLATE 0.2 MG/ML IJ SOLN
0.2000 mg | Freq: Once | INTRAMUSCULAR | Status: AC
  Administered 2019-12-12: 0.1 mg via INTRAVENOUS

## 2019-12-12 MED ORDER — OMEPRAZOLE 20 MG PO CPDR: 20.0000 mg | DELAYED_RELEASE_CAPSULE | Freq: Two times a day (BID) | ORAL | 5 refills | Status: DC

## 2019-12-12 MED ORDER — GLYCOPYRROLATE 0.2 MG/ML IJ SOLN
INTRAMUSCULAR | Status: AC
  Filled 2019-12-12: qty 1

## 2019-12-12 NOTE — Op Note (Signed)
[facility] Patient Name: Christy [name] Procedure Date: 12/12/2019 1:17 PM MRN: [id number] Date of Birth: September 21, 1950 Attending MD: Elon Alas. Abbey Chatters DO CSN: [id number] Age: 69 Admit Type: Outpatient Procedure:                Upper GI endoscopy Indications:              Dysphagia Providers:                Elon Alas. Abbey Chatters, DO, Charlsie Quest. Theda Sers RN, RN,                            Lambert Mody, Randa Spike, Technician Referring MD:              Medicines:                See the Anesthesia note for documentation of the                            administered medications Complications:            No immediate complications. Estimated Blood Loss:     Estimated blood loss was minimal. Procedure:                Pre-Anesthesia Assessment:                           - The anesthesia plan was to use monitored                            anesthesia care (MAC).                           After obtaining informed consent, the endoscope was                            passed under direct vision. Throughout the                            procedure, the patient's blood pressure, pulse, and                            oxygen saturations were monitored continuously. The                            [id number]-H190 ([id number]) scope was introduced through the                            mouth, and advanced to the second part of duodenum.                            The upper GI endoscopy was accomplished without                            difficulty. The patient tolerated the procedure                            well. Scope In:  1:27:42 PM Scope Out: 1:35:27 PM Total Procedure Duration: 0 hours 7 minutes 45 seconds  Findings:      One benign-appearing, intrinsic mild stenosis was found in the lower       third of the esophagus. The stenosis was traversed. A TTS dilator was       passed through the scope. Dilation with an 18-19-20 mm balloon dilator       was performed to 19 mm. The dilation site was  examined and showed mild       mucosal disruption and moderate improvement in luminal narrowing.      Diffuse moderate inflammation characterized by erosions, erythema and       shallow ulcerations was found in the gastric body. Biopsies were taken       with a cold forceps for Helicobacter pylori testing.      Localized moderate inflammation characterized by erosions, erythema and       shallow ulcerations was found in the second portion of the duodenum.       Biopsies were taken with a cold forceps for histology. Impression:               - Benign-appearing esophageal stenosis. Dilated.                           - Gastritis. Biopsied.                           - Duodenitis. Biopsied. Moderate Sedation:      Per Anesthesia Care Recommendation:           - Patient has a contact number available for                            emergencies. The signs and symptoms of potential                            delayed complications were discussed with the                            patient. Return to normal activities tomorrow.                            Written discharge instructions were provided to the                            patient.                           - Resume previous diet.                           - Continue present medications.                           - Await pathology results.                           - Repeat upper endoscopy at appointment to be  scheduled for surveillance based on pathology                            results.                           - Return to GI clinic in 4 weeks with Dr. Abbey Chatters.                           - Use Prilosec (omeprazole) 20 mg PO BID for 8                            weeks. Procedure Code(s):        --- Professional ---                           [phone], Esophagogastroduodenoscopy, flexible,                            transoral; with transendoscopic balloon dilation of                            esophagus (less than  30 mm diameter)                           [id number], 59, Esophagogastroduodenoscopy, flexible,                            transoral; with biopsy, single or multiple Diagnosis Code(s):        --- Professional ---                           K22.2, Esophageal obstruction                           K29.70, Gastritis, unspecified, without bleeding                           K29.80, Duodenitis without bleeding                           R13.10, Dysphagia, unspecified CPT copyright 2019 American Medical Association. All rights reserved. The codes documented in this report are preliminary and upon coder review may  be revised to meet current compliance requirements. Elon Alas. Abbey Chatters, DO [name], DO 12/12/2019 1:38:57 PM This report has been signed electronically. Number of Addenda: 0

## 2019-12-12 NOTE — Op Note (Addendum)
[facility] Patient Name: Christy [name] Procedure Date: 12/12/2019 1:37 PM MRN: [id number] Date of Birth: 1950-09-03 Attending MD: Elon Alas. Edgar Frisk CSN: [id number] Age: 69 Admit Type: Outpatient Procedure:                Colonoscopy Indications:              Screening for colorectal malignant neoplasm Providers:                Elon Alas. Abbey Chatters, DO, Selena Lesser RN, RN,                            Lambert Mody, Randa Spike, Technician Referring MD:              Medicines:                See the Anesthesia note for documentation of the                            administered medications Complications:            No immediate complications. Estimated Blood Loss:     Estimated blood loss was minimal. Procedure:                Pre-Anesthesia Assessment:                           - The anesthesia plan was to use monitored                            anesthesia care (MAC).                           After obtaining informed consent, the colonoscope                            was passed under direct vision. Throughout the                            procedure, the patient's blood pressure, pulse, and                            oxygen saturations were monitored continuously. The                            [id number]-H190DL ([id number]) scope was introduced through                            the anus and advanced to the the cecum, identified                            by appendiceal orifice and ileocecal valve. The                            colonoscopy was performed without difficulty. The                            patient tolerated the  procedure well. The quality                            of the bowel preparation was evaluated using the                            BBPS [facility] Bowel Preparation Scale) with scores                            of: Right Colon = 2 (minor amount of residual                            staining, small fragments of stool and/or opaque                             liquid, but mucosa seen well), Transverse Colon = 2                            (minor amount of residual staining, small fragments                            of stool and/or opaque liquid, but mucosa seen                            well) and Left Colon = 2 (minor amount of residual                            staining, small fragments of stool and/or opaque                            liquid, but mucosa seen well). The total BBPS score                            equals 6. The quality of the bowel preparation was                            fair. Scope In: 1:39:51 PM Scope Out: 1:57:01 PM Scope Withdrawal Time: 0 hours 12 minutes 10 seconds  Total Procedure Duration: 0 hours 17 minutes 10 seconds  Findings:      The perianal and digital rectal examinations were normal.      Non-bleeding internal hemorrhoids were found during endoscopy.      Multiple small-mouthed diverticula were found in the sigmoid colon.      Two sessile polyps were found in the transverse colon. The polyps were 7       to 9 mm in size. These polyps were removed with a cold snare. Resection       and retrieval were complete. Impression:               - Preparation of the colon was fair.                           - Non-bleeding internal hemorrhoids.                           -  Diverticulosis in the sigmoid colon.                           - Two 7 to 9 mm polyps in the transverse colon,                            removed with a cold snare. Resected and retrieved. Moderate Sedation:      Per Anesthesia Care Recommendation:           - Patient has a contact number available for                            emergencies. The signs and symptoms of potential                            delayed complications were discussed with the                            patient. Return to normal activities tomorrow.                            Written discharge instructions were provided to the                            patient.                            - Resume previous diet.                           - Continue present medications.                           - Await pathology results.                           - Repeat colonoscopy in 3 years for surveillance                            and borderline colon prep.                           - Return to GI clinic. Procedure Code(s):        --- Professional ---                           [phone], Colonoscopy, flexible; with removal of                            tumor(s), polyp(s), or other lesion(s) by snare                            technique Diagnosis Code(s):        --- Professional ---                           Z12.11,  Encounter for screening for malignant                            neoplasm of colon                           K64.8, Other hemorrhoids                           K63.5, Polyp of colon                           K57.30, Diverticulosis of large intestine without                            perforation or abscess without bleeding CPT copyright 2019 American Medical Association. All rights reserved. The codes documented in this report are preliminary and upon coder review may  be revised to meet current compliance requirements. Elon Alas. Abbey Chatters, DO [place] Abbey Chatters, DO 12/12/2019 2:04:05 PM This report has been signed electronically. Number of Addenda: 0

## 2019-12-12 NOTE — Anesthesia Postprocedure Evaluation (Signed)
Anesthesia Post Note  Patient: Christy [name]  Procedure(s) Performed: COLONOSCOPY WITH PROPOFOL (N/A ) ESOPHAGOGASTRODUODENOSCOPY (EGD) WITH PROPOFOL (N/A ) BALLOON DILATION (N/A ) BIOPSY POLYPECTOMY  Patient location during evaluation: PACU Anesthesia Type: General Level of consciousness: awake, oriented, awake and alert and patient cooperative Pain management: pain level controlled Vital Signs Assessment: post-procedure vital signs reviewed and stable Respiratory status: spontaneous breathing, respiratory function stable and nonlabored ventilation Cardiovascular status: blood pressure returned to baseline and stable Postop Assessment: no headache and no backache Anesthetic complications: no   No complications documented.   Last Vitals:  Vitals:   12/12/19 1300  BP: (!) 146/55  Pulse: (!) 51  Resp: 18  Temp: 36.8 C  SpO2: 98%    Last Pain:  Vitals:   12/12/19 1300  TempSrc: Oral  PainSc: 0-No pain                 Tacy Learn

## 2019-12-12 NOTE — H&P (Signed)
Primary Care Physician:  Monico Blitz, MD Primary Gastroenterologist:  Dr. Abbey Chatters  Pre-Procedure History & Physical: HPI:  Christy [name] is a 69 y.o. female is herea colonoscopy to be performed for colon cancer screening purposes and an EGD due to history of dysphagia.   .  Patient denies any family history of colorectal cancer.  No melena or hematochezia.  No abdominal pain or unintentional weight loss.  No change in bowel habits.  Overall feels well from a GI standpoint.  Past Medical History:  Diagnosis Date  . Bipolar disorder ([place])   . Coronary atherosclerosis of native coronary artery    BMS circ 2006, 70% PDA 2009  . Essential hypertension   . Mixed hyperlipidemia   . OSA on CPAP   . Renal tubular acidosis, type IV   . SVT (supraventricular tachycardia) ([place])   . Type 2 diabetes mellitus ([place])     Past Surgical History:  Procedure Laterality Date  . Acromioclavicular arthritis    . CARDIAC CATHETERIZATION N/A 03/29/2015   Procedure: Left Heart Cath and Coronary Angiography;  Surgeon: Troy Sine, MD;  Location: [facility];  Service: Cardiovascular;  Laterality: N/A;  . RIght shoulder adhesive capsulitis    . Rotator cuff impingement syndrome    . Rotator cuff tear      Prior to Admission medications   Medication Sig Start Date End Date Taking? Authorizing Provider  allopurinol (ZYLOPRIM) 300 MG tablet Take 150 mg by mouth daily.   Yes [provider]  amLODipine (NORVASC) 5 MG tablet Take 5 mg by mouth at bedtime.    Yes [provider]  aspirin 81 MG EC tablet Take 81 mg by mouth at bedtime.    Yes [provider]  BESIVANCE 0.6 % SUSP Place 1 drop into the left eye See admin instructions. Instill one drop into the left eye four times daily for 2 days after each monthly injection 11/03/17  Yes [provider]  buPROPion (WELLBUTRIN XL) 300 MG 24 hr tablet Take 300 mg by mouth daily. 11/18/09  Yes [provider]   calcium carbonate (OS-CAL) 600 MG TABS Take 600 mg by mouth daily.     Yes [provider]  carvedilol (COREG) 12.5 MG tablet Take 12.5 mg by mouth 2 (two) times daily with a meal.   Yes [provider]  Cholecalciferol (VITAMIN D3) 25 MCG (1000 UT) CAPS Take 1,000 Units by mouth daily.    Yes [provider]  clotrimazole (LOTRIMIN) 1 % cream Apply 1 application topically daily as needed (itching).   Yes [provider]  divalproex (DEPAKOTE) 250 MG EC tablet Take 250 mg by mouth in the morning.    Yes [provider]  hydrALAZINE (APRESOLINE) 50 MG tablet Take 50 mg by mouth 3 (three) times daily. 06/28/19  Yes [provider]  metolazone (ZAROXOLYN) 5 MG tablet Take 5 mg by mouth daily as needed (shortness of breath).    Yes [provider]  Multiple Vitamin (MULTIVITAMIN) tablet Take 1 tablet by mouth daily.     Yes [provider]  nitroGLYCERIN (NITROLINGUAL) 0.4 MG/SPRAY spray Place 1 spray under the tongue every 5 (five) minutes x 3 doses as needed. 01/09/14  Yes Satira Sark, MD  NOVOLOG FLEXPEN 100 UNIT/ML FlexPen Inject 5-12 Units into the skin 3 (three) times daily as needed for high blood sugar.  08/25/19  Yes [provider]  potassium chloride (KLOR-CON) 10 MEQ tablet Take  10 mEq by mouth daily as needed (when taking metolazone).  06/29/19  Yes [provider]  rosuvastatin (CRESTOR) 10 MG tablet Take 10 mg by mouth daily.   Yes [provider]  sertraline (ZOLOFT) 100 MG tablet Take 200 mg by mouth daily.    Yes [provider]  tiZANidine (ZANAFLEX) 4 MG tablet Take 4 mg by mouth every evening.   Yes [provider]  torsemide (DEMADEX) 20 MG tablet Take 60 mg by mouth in the morning.    Yes [provider]  traZODone (DESYREL) 100 MG tablet Take 100 mg by mouth at bedtime.  03/10/17  Yes [provider]  TRESIBA FLEXTOUCH 200 UNIT/ML SOPN Inject 52  Units into the skin in the morning.  04/21/16  Yes [provider]  VICTOZA 18 MG/3ML SOPN Inject 1.2 mg into the skin every evening.  08/02/19  Yes [provider]  GLOBAL EASE INJECT PEN NEEDLES 32G X 4 MM MISC See admin instructions. 06/28/19   [provider]  HYDROcodone-acetaminophen (NORCO/VICODIN) 5-325 MG tablet Take 1 tablet by mouth 2 (two) times daily. Patient not taking: Reported on 12/05/2019 09/04/19   [provider]  isosorbide mononitrate (IMDUR) 60 MG 24 hr tablet Take 1.5 tablets (90 mg total) by mouth daily. Patient not taking: Reported on 12/05/2019 03/07/15   Satira Sark, MD  losartan (COZAAR) 25 MG tablet Take 25 mg by mouth daily.    [provider]  meclizine (ANTIVERT) 25 MG tablet Take 25 mg by mouth 3 (three) times daily as needed for dizziness.  06/30/18   [provider]    Allergies as of 10/30/2019 - Review Complete 10/26/2019  Allergen Reaction Noted  . Ace inhibitors  08/27/2010  . Lisinopril  09/25/2015  . Penicillins  12/14/2007  . Tetracycline  12/14/2007  . Lamictal [lamotrigine] Rash 08/27/2010    Family History  Problem Relation Age of Onset  . Bipolar disorder Brother   . Diabetes Father     Social History   Socioeconomic History  . Marital status: Divorced    Spouse name: Not on file  . Number of children: Not on file  . Years of education: Not on file  . Highest education level: Not on file  Occupational History  . Occupation: PLANNING AND INSPECTOR    Employer: [employer]  Tobacco Use  . Smoking status: Former Smoker    Packs/day: 1.00    Years: 20.00    Pack years: 20.00    Types: Cigarettes    Start date: 10/30/1984    Quit date: 07/08/2013    Years since quitting: 6.4  . Smokeless tobacco: Never Used  Substance and Sexual Activity  . Alcohol use: No    Alcohol/week: 0.0 standard drinks  . Drug use: No  . Sexual activity: Not on file  Other Topics Concern  . Not on  file  Social History Narrative   She has lived with some of her daughters. Works part-time for the city.    Social Determinants of Health   Financial Resource Strain:   . Difficulty of Paying Living Expenses: Not on file  Food Insecurity:   . Worried About Charity fundraiser in the Last Year: Not on file  . Ran Out of Food in the Last Year: Not on file  Transportation Needs:   . Lack of Transportation (Medical): Not on file  . Lack of Transportation (Non-Medical): Not on file  Physical Activity:   .  Days of Exercise per Week: Not on file  . Minutes of Exercise per Session: Not on file  Stress:   . Feeling of Stress : Not on file  Social Connections:   . Frequency of Communication with Friends and Family: Not on file  . Frequency of Social Gatherings with Friends and Family: Not on file  . Attends Religious Services: Not on file  . Active Member of Clubs or Organizations: Not on file  . Attends Archivist Meetings: Not on file  . Marital Status: Not on file  Intimate Partner Violence:   . Fear of Current or Ex-Partner: Not on file  . Emotionally Abused: Not on file  . Physically Abused: Not on file  . Sexually Abused: Not on file    Review of Systems: See HPI, otherwise negative ROS  Impression/Plan: Christy [name] is here for a colonoscopy to be performed for colon cancer screening purposes and an EGD due to history of dysphagia.   The risks of the procedure including infection, bleed, or perforation as well as benefits, limitations, alternatives and imponderables have been reviewed with the patient. Questions have been answered. All parties agreeable.

## 2019-12-12 NOTE — Transfer of Care (Signed)
Immediate Anesthesia Transfer of Care Note  Patient: DALASIA PREDMORE  Procedure(s) Performed: COLONOSCOPY WITH PROPOFOL (N/A ) ESOPHAGOGASTRODUODENOSCOPY (EGD) WITH PROPOFOL (N/A ) BALLOON DILATION (N/A ) BIOPSY POLYPECTOMY  Patient Location: PACU  Anesthesia Type:General  Level of Consciousness: awake, alert , oriented and patient cooperative  Airway & Oxygen Therapy: Patient Spontanous Breathing  Post-op Assessment: Report given to RN, Post -op Vital signs reviewed and stable and Patient moving all extremities  Post vital signs: Reviewed and stable  Last Vitals:  Vitals Value Taken Time  BP    Temp    Pulse 51 12/12/19 1359  Resp 18 12/12/19 1359  SpO2 94 % 12/12/19 1359  Vitals shown include unvalidated device data.  Last Pain:  Vitals:   12/12/19 1300  TempSrc: Oral  PainSc: 0-No pain      Patients Stated Pain Goal: 5 (93/81/82 9937)  Complications: No complications documented.

## 2019-12-12 NOTE — Discharge Instructions (Addendum)
EGD Discharge instructions Please read the instructions outlined below and refer to this sheet in the next few weeks. These discharge instructions provide you with general information on caring for yourself after you leave the hospital. Your doctor may also give you specific instructions. While your treatment has been planned according to the most current medical practices available, unavoidable complications occasionally occur. If you have any problems or questions after discharge, please call your doctor. ACTIVITY  You may resume your regular activity but move at a slower pace for the next 24 hours.   Take frequent rest periods for the next 24 hours.   Walking will help expel (get rid of) the air and reduce the bloated feeling in your abdomen.   No driving for 24 hours (because of the anesthesia (medicine) used during the test).   You may shower.   Do not sign any important legal documents or operate any machinery for 24 hours (because of the anesthesia used during the test).  NUTRITION  Drink plenty of fluids.   You may resume your normal diet.   Begin with a light meal and progress to your normal diet.   Avoid alcoholic beverages for 24 hours or as instructed by your caregiver.  MEDICATIONS  You may resume your normal medications unless your caregiver tells you otherwise.  WHAT YOU CAN EXPECT TODAY  You may experience abdominal discomfort such as a feeling of fullness or "gas" pains.  FOLLOW-UP  Your doctor will discuss the results of your test with you.  SEEK IMMEDIATE MEDICAL ATTENTION IF ANY OF THE FOLLOWING OCCUR:  Excessive nausea (feeling sick to your stomach) and/or vomiting.   Severe abdominal pain and distention (swelling).   Trouble swallowing.   Temperature over 101 F (37.8 C).   Rectal bleeding or vomiting of blood.     Colonoscopy Discharge Instructions  Read the instructions outlined below and refer to this sheet in the next few weeks. These  discharge instructions provide you with general information on caring for yourself after you leave the hospital. Your doctor may also give you specific instructions. While your treatment has been planned according to the most current medical practices available, unavoidable complications occasionally occur.   ACTIVITY  You may resume your regular activity, but move at a slower pace for the next 24 hours.   Take frequent rest periods for the next 24 hours.   Walking will help get rid of the air and reduce the bloated feeling in your belly (abdomen).   No driving for 24 hours (because of the medicine (anesthesia) used during the test).    Do not sign any important legal documents or operate any machinery for 24 hours (because of the anesthesia used during the test).  NUTRITION  Drink plenty of fluids.   You may resume your normal diet as instructed by your doctor.   Begin with a light meal and progress to your normal diet. Heavy or fried foods are harder to digest and may make you feel sick to your stomach (nauseated).   Avoid alcoholic beverages for 24 hours or as instructed.  MEDICATIONS  You may resume your normal medications unless your doctor tells you otherwise.  WHAT YOU CAN EXPECT TODAY  Some feelings of bloating in the abdomen.   Passage of more gas than usual.   Spotting of blood in your stool or on the toilet paper.  IF YOU HAD POLYPS REMOVED DURING THE COLONOSCOPY:  No aspirin products for 7 days or as instructed.  No alcohol for 7 days or as instructed.   Eat a soft diet for the next 24 hours.  FINDING OUT THE RESULTS OF YOUR TEST Not all test results are available during your visit. If your test results are not back during the visit, make an appointment with your caregiver to find out the results. Do not assume everything is normal if you have not heard from your caregiver or the medical facility. It is important for you to follow up on all of your test results.    SEEK IMMEDIATE MEDICAL ATTENTION IF:  You have more than a spotting of blood in your stool.   Your belly is swollen (abdominal distention).   You are nauseated or vomiting.   You have a temperature over 101.   You have abdominal pain or discomfort that is severe or gets worse throughout the day.   Your EGD showed moderate amount of inflammation both in your stomach and your small bowel.  I biopsied both to rule out infectious etiologies.  I am going to start you on a new medication called omeprazole 20 mg twice daily.  I want you to take it 30 minutes before breakfast and 30 minutes before dinner.  Await pathology results, my office will contact you next week.  You did have a slight narrowing of your esophagus and I dilated this with a balloon.  Hopefully this helps with your swallowing.  Colonoscopy was unremarkable besides 2 polyps which I removed successfully.  No evidence of colon cancer.  I would recommend we repeat this in 3 years both for surveillance due to polyps as well as borderline colon preparation.  Follow-up with Dr. Abbey [name] in 4 to 6 weeks  I hope you have a great rest of your week!  Christy [name], D.O. Gastroenterology and Hepatology [facility]

## 2019-12-12 NOTE — Anesthesia Preprocedure Evaluation (Signed)
Anesthesia Evaluation  Patient identified by MRN, date of birth, ID band Patient awake    Reviewed: Allergy & Precautions, NPO status , Patient's Chart, lab work & pertinent test results, reviewed documented beta blocker date and time   History of Anesthesia Complications Negative for: history of anesthetic complications  Airway Mallampati: II  TM Distance: >3 FB Neck ROM: Full    Dental  (+) Dental Advisory Given, Teeth Intact   Pulmonary sleep apnea and Continuous Positive Airway Pressure Ventilation , former smoker,    Pulmonary exam normal breath sounds clear to auscultation       Cardiovascular Exercise Tolerance: Poor hypertension, Pt. on medications and Pt. on home beta blockers + CAD and + Cardiac Stents  Normal cardiovascular exam+ dysrhythmias Supra Ventricular Tachycardia  Rhythm:Regular Rate:Normal  1. Left ventricular ejection fraction, by visual estimation, is 55 to 60%. The left ventricle has normal function. There is mildly increased left ventricular hypertrophy.  2. Left ventricular diastolic parameters are consistent with Grade II diastolic dysfunction (pseudonormalization).  3. Moderately dilated left ventricular internal cavity size.  4. The left ventricle has no regional wall motion abnormalities.  5. Global right ventricle has normal systolic function.The right ventricular size is normal. No increase in right ventricular wall thickness.  6. Left atrial size was moderately dilated.  7. Right atrial size was normal.  8. Mild mitral annular calcification.  9. The mitral valve is grossly normal. Mild to moderate mitral valve regurgitation.  10. The tricuspid valve is grossly normal.  11. The aortic valve is tricuspid. Aortic valve regurgitation is not visualized.  12. The pulmonic valve was grossly normal. Pulmonic valve regurgitation is trivial.  13. Normal pulmonary artery systolic pressure.  14. The  tricuspid regurgitant velocity is 2.33 m/s, and with an assumed right atrial pressure of 8 mmHg, the estimated right ventricular systolic pressure is normal at 29.7 mmHg.  15. The inferior vena cava is normal in size with greater than 50% respiratory variability, suggesting right atrial pressure of 3 mmHg.    Neuro/Psych PSYCHIATRIC DISORDERS Bipolar Disorder negative neurological ROS     GI/Hepatic negative GI ROS, Neg liver ROS,   Endo/Other  diabetes, Well Controlled, Type 2, Insulin Dependent  Renal/GU Renal InsufficiencyRenal disease  negative genitourinary   Musculoskeletal negative musculoskeletal ROS (+)   Abdominal   Peds negative pediatric ROS (+)  Hematology  (+) anemia ,   Anesthesia Other Findings   Reproductive/Obstetrics negative OB ROS                             Anesthesia Physical Anesthesia Plan  ASA: III  Anesthesia Plan: General   Post-op Pain Management:    Induction: Intravenous  PONV Risk Score and Plan: TIVA  Airway Management Planned: Nasal Cannula, Natural Airway and Simple Face Mask  Additional Equipment:   Intra-op Plan:   Post-operative Plan:   Informed Consent: I have reviewed the patients History and Physical, chart, labs and discussed the procedure including the risks, benefits and alternatives for the proposed anesthesia with the patient or authorized representative who has indicated his/her understanding and acceptance.     Dental advisory given  Plan Discussed with: CRNA and Surgeon  Anesthesia Plan Comments:         Anesthesia Quick Evaluation

## 2019-12-13 ENCOUNTER — Other Ambulatory Visit: Payer: Self-pay

## 2019-12-15 ENCOUNTER — Other Ambulatory Visit: Payer: Self-pay

## 2019-12-15 LAB — SURGICAL PATHOLOGY

## 2019-12-18 ENCOUNTER — Telehealth: Payer: Self-pay | Admitting: Internal Medicine

## 2019-12-18 ENCOUNTER — Encounter (HOSPITAL_COMMUNITY): Payer: Self-pay | Admitting: Internal Medicine

## 2019-12-18 DIAGNOSIS — K2980 Duodenitis without bleeding: Secondary | ICD-10-CM

## 2019-12-18 NOTE — Telephone Encounter (Signed)
Attempted to call patient with her pathology results.  She did not answer the phone and her voicemail is not set up.    Please let her know that she has marked inflammation in her small bowel.  I want to check a gastrin level which I have ordered to ensure that she does not have underlying condition that causes hypersecretion of acid.   She needs to avoid NSAIDs.  She needs to continue on omeprazole twice daily.  She already has follow-up with me in a couple months which she should keep.  Thank you

## 2019-12-19 DIAGNOSIS — E119 Type 2 diabetes mellitus without complications: Secondary | ICD-10-CM | POA: Diagnosis not present

## 2019-12-19 DIAGNOSIS — E7800 Pure hypercholesterolemia, unspecified: Secondary | ICD-10-CM | POA: Diagnosis not present

## 2019-12-19 DIAGNOSIS — I10 Essential (primary) hypertension: Secondary | ICD-10-CM | POA: Diagnosis not present

## 2019-12-19 DIAGNOSIS — I2510 Atherosclerotic heart disease of native coronary artery without angina pectoris: Secondary | ICD-10-CM | POA: Diagnosis not present

## 2019-12-26 DIAGNOSIS — I110 Hypertensive heart disease with heart failure: Secondary | ICD-10-CM | POA: Diagnosis not present

## 2019-12-26 DIAGNOSIS — I428 Other cardiomyopathies: Secondary | ICD-10-CM | POA: Diagnosis not present

## 2019-12-26 DIAGNOSIS — I2510 Atherosclerotic heart disease of native coronary artery without angina pectoris: Secondary | ICD-10-CM | POA: Diagnosis not present

## 2019-12-26 DIAGNOSIS — G4733 Obstructive sleep apnea (adult) (pediatric): Secondary | ICD-10-CM | POA: Diagnosis not present

## 2019-12-27 DIAGNOSIS — N170 Acute kidney failure with tubular necrosis: Secondary | ICD-10-CM | POA: Diagnosis not present

## 2020-01-02 DIAGNOSIS — E1159 Type 2 diabetes mellitus with other circulatory complications: Secondary | ICD-10-CM | POA: Diagnosis not present

## 2020-01-02 DIAGNOSIS — E1143 Type 2 diabetes mellitus with diabetic autonomic (poly)neuropathy: Secondary | ICD-10-CM | POA: Diagnosis not present

## 2020-01-03 DIAGNOSIS — E1122 Type 2 diabetes mellitus with diabetic chronic kidney disease: Secondary | ICD-10-CM | POA: Diagnosis not present

## 2020-01-03 DIAGNOSIS — D638 Anemia in other chronic diseases classified elsewhere: Secondary | ICD-10-CM | POA: Diagnosis not present

## 2020-01-03 DIAGNOSIS — N170 Acute kidney failure with tubular necrosis: Secondary | ICD-10-CM | POA: Diagnosis not present

## 2020-01-03 DIAGNOSIS — I5032 Chronic diastolic (congestive) heart failure: Secondary | ICD-10-CM | POA: Diagnosis not present

## 2020-01-03 DIAGNOSIS — N189 Chronic kidney disease, unspecified: Secondary | ICD-10-CM | POA: Diagnosis not present

## 2020-01-03 DIAGNOSIS — R809 Proteinuria, unspecified: Secondary | ICD-10-CM | POA: Diagnosis not present

## 2020-01-03 DIAGNOSIS — E1129 Type 2 diabetes mellitus with other diabetic kidney complication: Secondary | ICD-10-CM | POA: Diagnosis not present

## 2020-01-09 DIAGNOSIS — L609 Nail disorder, unspecified: Secondary | ICD-10-CM | POA: Diagnosis not present

## 2020-01-09 DIAGNOSIS — L110 Acquired keratosis follicularis: Secondary | ICD-10-CM | POA: Diagnosis not present

## 2020-01-09 DIAGNOSIS — E1140 Type 2 diabetes mellitus with diabetic neuropathy, unspecified: Secondary | ICD-10-CM | POA: Diagnosis not present

## 2020-01-18 DIAGNOSIS — E7800 Pure hypercholesterolemia, unspecified: Secondary | ICD-10-CM | POA: Diagnosis not present

## 2020-01-18 DIAGNOSIS — E119 Type 2 diabetes mellitus without complications: Secondary | ICD-10-CM | POA: Diagnosis not present

## 2020-01-18 DIAGNOSIS — I2510 Atherosclerotic heart disease of native coronary artery without angina pectoris: Secondary | ICD-10-CM | POA: Diagnosis not present

## 2020-01-18 DIAGNOSIS — I10 Essential (primary) hypertension: Secondary | ICD-10-CM | POA: Diagnosis not present

## 2020-01-25 ENCOUNTER — Other Ambulatory Visit: Payer: Self-pay

## 2020-01-25 ENCOUNTER — Inpatient Hospital Stay (HOSPITAL_COMMUNITY): Payer: Medicare Other | Attending: Hematology

## 2020-01-25 ENCOUNTER — Ambulatory Visit (HOSPITAL_COMMUNITY)
Admission: RE | Admit: 2020-01-25 | Discharge: 2020-01-25 | Disposition: A | Discharge status: Home / Self Care | Payer: Medicare Other | Source: Ambulatory Visit | Attending: Hematology | Admitting: Hematology

## 2020-01-25 DIAGNOSIS — N289 Disorder of kidney and ureter, unspecified: Secondary | ICD-10-CM | POA: Insufficient documentation

## 2020-01-25 DIAGNOSIS — Z79899 Other long term (current) drug therapy: Secondary | ICD-10-CM | POA: Insufficient documentation

## 2020-01-25 DIAGNOSIS — E1122 Type 2 diabetes mellitus with diabetic chronic kidney disease: Secondary | ICD-10-CM | POA: Insufficient documentation

## 2020-01-25 DIAGNOSIS — D472 Monoclonal gammopathy: Secondary | ICD-10-CM | POA: Insufficient documentation

## 2020-01-25 DIAGNOSIS — D649 Anemia, unspecified: Secondary | ICD-10-CM | POA: Insufficient documentation

## 2020-01-25 DIAGNOSIS — Z87891 Personal history of nicotine dependence: Secondary | ICD-10-CM | POA: Diagnosis not present

## 2020-01-25 DIAGNOSIS — N184 Chronic kidney disease, stage 4 (severe): Secondary | ICD-10-CM | POA: Diagnosis not present

## 2020-01-25 DIAGNOSIS — I129 Hypertensive chronic kidney disease with stage 1 through stage 4 chronic kidney disease, or unspecified chronic kidney disease: Secondary | ICD-10-CM | POA: Diagnosis not present

## 2020-01-25 LAB — CBC WITH DIFFERENTIAL/PLATELET
Abs Immature Granulocytes: 0.03 10*3/uL (ref 0.00–0.07)
Basophils Absolute: 0.1 10*3/uL (ref 0.0–0.1)
Basophils Relative: 1 %
Eosinophils Absolute: 1.9 10*3/uL — ABNORMAL HIGH (ref 0.0–0.5)
Eosinophils Relative: 18 %
HCT: 34.5 % — ABNORMAL LOW (ref 36.0–46.0)
Hemoglobin: 10.7 g/dL — ABNORMAL LOW (ref 12.0–15.0)
Immature Granulocytes: 0 %
Lymphocytes Relative: 13 %
Lymphs Abs: 1.4 10*3/uL (ref 0.7–4.0)
MCH: 31.9 pg (ref 26.0–34.0)
MCHC: 31 g/dL (ref 30.0–36.0)
MCV: 103 fL — ABNORMAL HIGH (ref 80.0–100.0)
Monocytes Absolute: 0.6 10*3/uL (ref 0.1–1.0)
Monocytes Relative: 5 %
Neutro Abs: 6.6 10*3/uL (ref 1.7–7.7)
Neutrophils Relative %: 63 %
Platelets: 196 10*3/uL (ref 150–400)
RBC: 3.35 MIL/uL — ABNORMAL LOW (ref 3.87–5.11)
RDW: 12.6 % (ref 11.5–15.5)
WBC: 10.6 10*3/uL — ABNORMAL HIGH (ref 4.0–10.5)
nRBC: 0 % (ref 0.0–0.2)

## 2020-01-25 LAB — COMPREHENSIVE METABOLIC PANEL
ALT: 14 U/L (ref 0–44)
AST: 16 U/L (ref 15–41)
Albumin: 3.1 g/dL — ABNORMAL LOW (ref 3.5–5.0)
Alkaline Phosphatase: 77 U/L (ref 38–126)
Anion gap: 8 (ref 5–15)
BUN: 31 mg/dL — ABNORMAL HIGH (ref 8–23)
CO2: 28 mmol/L (ref 22–32)
Calcium: 8.3 mg/dL — ABNORMAL LOW (ref 8.9–10.3)
Chloride: 104 mmol/L (ref 98–111)
Creatinine, Ser: 1.1 mg/dL — ABNORMAL HIGH (ref 0.44–1.00)
GFR, Estimated: 54 mL/min — ABNORMAL LOW (ref >60)
Glucose, Bld: 131 mg/dL — ABNORMAL HIGH (ref 70–99)
Potassium: 3.7 mmol/L (ref 3.5–5.1)
Sodium: 140 mmol/L (ref 135–145)
Total Bilirubin: 0.6 mg/dL (ref 0.3–1.2)
Total Protein: 5.9 g/dL — ABNORMAL LOW (ref 6.5–8.1)

## 2020-01-25 LAB — LACTATE DEHYDROGENASE: LDH: 195 U/L — ABNORMAL HIGH (ref 98–192)

## 2020-01-25 LAB — IRON AND TIBC
Iron: 69 ug/dL (ref 28–170)
Saturation Ratios: 29 % (ref 10.4–31.8)
TIBC: 238 ug/dL — ABNORMAL LOW (ref 250–450)
UIBC: 169 ug/dL

## 2020-01-25 LAB — FERRITIN: Ferritin: 172 ng/mL (ref 11–307)

## 2020-01-25 LAB — FOLATE: Folate: 15.9 ng/mL (ref >5.9)

## 2020-01-26 LAB — KAPPA/LAMBDA LIGHT CHAINS
Kappa free light chain: 73.8 mg/L — ABNORMAL HIGH (ref 3.3–19.4)
Kappa, lambda light chain ratio: 1.28 (ref 0.26–1.65)
Lambda free light chains: 57.5 mg/L — ABNORMAL HIGH (ref 5.7–26.3)

## 2020-01-26 LAB — PROTEIN ELECTROPHORESIS, SERUM
A/G Ratio: 1.3 (ref 0.7–1.7)
Albumin ELP: 3 g/dL (ref 2.9–4.4)
Alpha-1-Globulin: 0.3 g/dL (ref 0.0–0.4)
Alpha-2-Globulin: 0.4 g/dL (ref 0.4–1.0)
Beta Globulin: 0.9 g/dL (ref 0.7–1.3)
Gamma Globulin: 0.9 g/dL (ref 0.4–1.8)
Globulin, Total: 2.4 g/dL (ref 2.2–3.9)
M-Spike, %: 0.4 g/dL — ABNORMAL HIGH
Total Protein ELP: 5.4 g/dL — ABNORMAL LOW (ref 6.0–8.5)

## 2020-01-31 NOTE — Progress Notes (Signed)
[name], [place] [address]   CLINIC:  Medical Oncology/Hematology  PCP:  Monico Blitz, [place] [address]  [phone]  REASON FOR VISIT:  Follow-up for MGUS  PRIOR THERAPY: None  CURRENT THERAPY: Observation  INTERVAL HISTORY:  Christy [name], a 70 y.o. female, returns for routine follow-up for her MGUS. Christy [name] was last seen on 09/14/2019.  Today Christy [name] reports that Christy [name] continues to have energy levels at 50%.  Appetite is 100%.  Reports pain rated as 3 out of 10 in the left hip from arthritis.  Denies any bleeding per rectum or melena.  Denies any other new onset pains.  No fevers or infections noted.  REVIEW OF SYSTEMS:  Review of Systems  Musculoskeletal: Positive for arthralgias.  All other systems reviewed and are negative.   PAST MEDICAL/SURGICAL HISTORY:  Past Medical History:  Diagnosis Date  . Bipolar disorder ([place])   . Coronary atherosclerosis of native coronary artery    BMS circ 2006, 70% PDA 2009  . Essential hypertension   . Mixed hyperlipidemia   . OSA on CPAP   . Renal tubular acidosis, type IV   . SVT (supraventricular tachycardia) ([place])   . Type 2 diabetes mellitus ([place])    Past Surgical History:  Procedure Laterality Date  . Acromioclavicular arthritis    . BALLOON DILATION N/A 12/12/2019   Procedure: BALLOON DILATION;  Surgeon: [name], DO;  [facility];  Service: Endoscopy;  Laterality: N/A;  . BIOPSY  12/12/2019   Procedure: BIOPSY;  Surgeon: [name], DO;  [facility];  Service: Endoscopy;;  . CARDIAC CATHETERIZATION N/A 03/29/2015   Procedure: Left Heart Cath and Coronary Angiography;  Surgeon: Troy Sine, MD;  Location: [facility];  Service: Cardiovascular;  Laterality: N/A;  . COLONOSCOPY WITH PROPOFOL N/A 12/12/2019   Procedure: COLONOSCOPY WITH PROPOFOL;  Surgeon: [name], DO;  [facility];  Service:  Endoscopy;  Laterality: N/A;  2:30pm  . ESOPHAGOGASTRODUODENOSCOPY (EGD) WITH PROPOFOL N/A 12/12/2019   Procedure: ESOPHAGOGASTRODUODENOSCOPY (EGD) WITH PROPOFOL;  Surgeon: [name], DO;  [facility];  Service: Endoscopy;  Laterality: N/A;  . POLYPECTOMY  12/12/2019   Procedure: POLYPECTOMY;  Surgeon: [name], DO;  [facility];  Service: Endoscopy;;  . RIght shoulder adhesive capsulitis    . Rotator cuff impingement syndrome    . Rotator cuff tear      SOCIAL HISTORY:  Social History   Socioeconomic History  . Marital status: Divorced    Spouse name: Not on file  . Number of children: Not on file  . Years of education: Not on file  . Highest education level: Not on file  Occupational History  . Occupation: PLANNING AND INSPECTOR    Employer: [employer]  Tobacco Use  . Smoking status: Former Smoker    Packs/day: 1.00    Years: 20.00    Pack years: 20.00    Types: Cigarettes    Start date: 10/30/1984    Quit date: 07/08/2013    Years since quitting: 6.5  . Smokeless tobacco: Never Used  Substance and Sexual Activity  . Alcohol use: No    Alcohol/week: 0.0 standard drinks  . Drug use: No  . Sexual activity: Not on file  Other Topics Concern  . Not on file  Social History Narrative   Christy [name] has lived with  some of her daughters. Works part-time for the city.    Social Determinants of Health   Financial Resource Strain: Not on file  Food Insecurity: Not on file  Transportation Needs: Not on file  Physical Activity: Not on file  Stress: Not on file  Social Connections: Not on file  Intimate Partner Violence: Not on file    FAMILY HISTORY:  Family History  Problem Relation Age of Onset  . Bipolar disorder Brother   . Diabetes Father     CURRENT MEDICATIONS:  Current Outpatient Medications  Medication Sig Dispense Refill  . allopurinol (ZYLOPRIM) 300 MG tablet Take 150 mg by mouth daily.    Marland Kitchen amLODipine (NORVASC) 5 MG  tablet Take 5 mg by mouth at bedtime.     Marland Kitchen aspirin 81 MG EC tablet Take 81 mg by mouth at bedtime.     Marland Kitchen BESIVANCE 0.6 % SUSP Place 1 drop into the left eye See admin instructions. Instill one drop into the left eye four times daily for 2 days after each monthly injection  12  . buPROPion (WELLBUTRIN XL) 300 MG 24 hr tablet Take 300 mg by mouth daily.    . calcium carbonate (OS-CAL) 600 MG TABS Take 600 mg by mouth daily.      . carvedilol (COREG) 12.5 MG tablet Take 12.5 mg by mouth 2 (two) times daily with a meal.    . Cholecalciferol (VITAMIN D3) 25 MCG (1000 UT) CAPS Take 1,000 Units by mouth daily.     . clotrimazole (LOTRIMIN) 1 % cream Apply 1 application topically daily as needed (itching).    Marland Kitchen divalproex (DEPAKOTE) 250 MG EC tablet Take 250 mg by mouth in the morning.     Marland Kitchen GLOBAL EASE INJECT PEN NEEDLES 32G X 4 MM MISC See admin instructions.    . hydrALAZINE (APRESOLINE) 50 MG tablet Take 50 mg by mouth 3 (three) times daily.    Marland Kitchen losartan (COZAAR) 25 MG tablet Take 25 mg by mouth daily.    . meclizine (ANTIVERT) 25 MG tablet Take 25 mg by mouth 3 (three) times daily as needed for dizziness.     . metolazone (ZAROXOLYN) 5 MG tablet Take 5 mg by mouth daily as needed (shortness of breath).     . Multiple Vitamin (MULTIVITAMIN) tablet Take 1 tablet by mouth daily.      . nitroGLYCERIN (NITROLINGUAL) 0.4 MG/SPRAY spray Place 1 spray under the tongue every 5 (five) minutes x 3 doses as needed. 12 g 2  . NOVOLOG FLEXPEN 100 UNIT/ML FlexPen Inject 5-12 Units into the skin 3 (three) times daily as needed for high blood sugar.     . omeprazole (PRILOSEC) 20 MG capsule Take 1 capsule (20 mg total) by mouth 2 (two) times daily. 60 capsule 5  . potassium chloride (KLOR-CON) 10 MEQ tablet Take 10 mEq by mouth daily as needed (when taking metolazone).     . rosuvastatin (CRESTOR) 10 MG tablet Take 10 mg by mouth daily.    . sertraline (ZOLOFT) 100 MG tablet Take 200 mg by mouth daily.     Marland Kitchen  tiZANidine (ZANAFLEX) 4 MG tablet Take 4 mg by mouth every evening.    . torsemide (DEMADEX) 20 MG tablet Take 60 mg by mouth in the morning.     . traZODone (DESYREL) 100 MG tablet Take 100 mg by mouth at bedtime.   3  . TRESIBA FLEXTOUCH 200 UNIT/ML SOPN Inject 52 Units into the skin in the morning.  2  . VICTOZA 18 MG/3ML SOPN Inject 1.2 mg into the skin every evening.      No current facility-administered medications for this visit.    ALLERGIES:  Allergies  Allergen Reactions  . Ace Inhibitors Cough  . Lisinopril Cough    Severe cough  . Penicillins     Unknown, childhood reaction  . Tetracycline Nausea Only  . Lamictal [Lamotrigine] Rash    Rash start inside to outter body.    PHYSICAL EXAM:  Performance status (ECOG): 1 - Symptomatic but completely ambulatory  There were no vitals filed for this visit. Wt Readings from Last 3 Encounters:  10/26/19 221 lb 6.4 oz (100.4 kg)  09/14/19 222 lb 14.4 oz (101.1 kg)  06/30/19 223 lb (101.2 kg)   Physical Exam Vitals reviewed.  Constitutional:      Appearance: Normal appearance.  Cardiovascular:     Rate and Rhythm: Normal rate and regular rhythm.     Heart sounds: Normal heart sounds.  Pulmonary:     Effort: Pulmonary effort is normal.     Breath sounds: Normal breath sounds.  Neurological:     Mental Status: Christy [name] is alert and oriented to person, place, and time.  Psychiatric:        Mood and Affect: Mood normal.        Behavior: Behavior normal.     LABORATORY DATA:  I have reviewed the labs as listed.  CBC Latest Ref Rng & Units 01/25/2020 09/07/2019 06/23/2019  WBC 4.0 - 10.5 K/uL 10.6(H) 7.8 6.2  Hemoglobin 12.0 - 15.0 g/dL 10.7(L) 11.9(L) 10.8(L)  Hematocrit 36.0 - 46.0 % 34.5(L) 37.7 34.6(L)  Platelets 150 - 400 K/uL 196 199 168   CMP Latest Ref Rng & Units 01/25/2020 09/07/2019 06/23/2019  Glucose 70 - 99 mg/dL 131(H) 122(H) 256(H)  BUN 8 - 23 mg/dL 31(H) 62(H) 74(H)  Creatinine 0.44 - 1.00 mg/dL 1.10(H) 1.77(H)  2.07(H)  Sodium 135 - 145 mmol/L 140 136 137  Potassium 3.5 - 5.1 mmol/L 3.7 4.6 4.5  Chloride 98 - 111 mmol/L 104 98 97(L)  CO2 22 - 32 mmol/L _0 Calcium 8.9 - 10.3 mg/dL 8.3(L) 9.5 8.6(L)  Total Protein 6.5 - 8.1 g/dL 5.9(L) 7.4 6.8  Total Bilirubin 0.3 - 1.2 mg/dL 0.6 0.3 0.5  Alkaline Phos 38 - 126 U/L 77 45 47  AST 15 - 41 U/L _1 ALT 0 - 44 U/L _2 Component Value Date/Time   RBC 3.35 (L) 01/25/2020 1333   MCV 103.0 (H) 01/25/2020 1333   MCH 31.9 01/25/2020 1333   MCHC 31.0 01/25/2020 1333   RDW 12.6 01/25/2020 1333   LYMPHSABS 1.4 01/25/2020 1333   MONOABS 0.6 01/25/2020 1333   EOSABS 1.9 (H) 01/25/2020 1333   BASOSABS 0.1 01/25/2020 1333   Lab Results  Component Value Date   TIBC 238 (L) 01/25/2020   TIBC 263 09/07/2019   TIBC 263 06/23/2019   FERRITIN 172 01/25/2020   FERRITIN 330 (H) 09/07/2019   FERRITIN 79 06/23/2019   IRONPCTSAT 29 01/25/2020   IRONPCTSAT 33 (H) 09/07/2019   IRONPCTSAT 25 06/23/2019   Lab Results  Component Value Date   TOTALPROTELP 5.4 (L) 01/25/2020   ALBUMINELP 3.0 01/25/2020   A1GS 0.3 01/25/2020   A2GS 0.4 01/25/2020   BETS 0.9 01/25/2020   GAMS 0.9 01/25/2020   MSPIKE 0.4 (H) 01/25/2020   SPEI Comment 01/25/2020    Lab Results  Component Value Date   KPAFRELGTCHN 73.8 (H) 01/25/2020   LAMBDASER 57.5 (H) 01/25/2020   KAPLAMBRATIO 1.28 01/25/2020    DIAGNOSTIC IMAGING:  I have independently reviewed the scans and discussed with the patient. DG Bone Survey Met  Result Date: 01/25/2020 CLINICAL DATA:  70 year old female with MGUS. Metastatic bone survey. EXAM: METASTATIC BONE SURVEY COMPARISON:  None. FINDINGS: No suspicious osseous lesions noted. The bones are osteopenic. There is no acute fracture or dislocation. There is degenerative changes of the spine. No acute cardiopulmonary process. Bibasilar atelectasis. IMPRESSION: No evidence of osseous metastatic disease. Electronically Signed   By: Anner Crete M.D.   On: 01/25/2020 15:56     ASSESSMENT:  1. 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 Christy [name] was noted to have an elevated creatinine and was seen by nephrologist in December 2014. Christy [name] had an SPEP done as part of her work-up for kidney disease and was noted to have a M spike of 0.6 with normal lambda light chain ratio. Immunofixation showed IgG kappa monoclonal protein. Patient had hep C antibody which was negative, ANA negative and C ANCA negative, P-ANCA negative, anti-double-stranded DNA antibody negative and complements were within normal limits. Her creatinine at that time was 1.32 and hemoglobin was 11.8. Also Christy [name] had no significant protein in her urine. -Christy [name] also had a bone scan which was noted to be negative. Christy [name] never had a bone marrow biopsy done at this time. -Patient was being followed by Dr. Tressie Stalker at our clinic since early 2017. -Labs on 06/01/2018 shows potassium 5.1, creatinine 1.67, calcium 8.6, LFTs were normal, LDH 213, SPEP stable with M spike 0.6 g/dL. WBC 7.8, hemoglobin 9.9, platelets 156. Light chain ratio 1.82. -Labs on 07/05/2018 showed her potassium 5.5, creatinine 1.72, calcium 8.7, hemoglobin 10.1. -We repeated her potassium at her visit on 07/12/2018 which showed her potassium 4.8. -Christy [name] reports no B symptoms including fevers, chills, night sweats, or unexplained weight loss. -Skeletal survey done on 07/05/2018 showed no lytic lesions. -Bone marrow biopsy done on 11/25/2018. Pathology consistent with hypercellular marrow with trilineage hematopoiesis and 2% plasma cells. FISH was negative. Cytogenetics was normal. -When trending her anemia and renal function since 2017 it has progressively gotten worse. Which is concerning for multiple myeloma. Patient was sent to [facility] for second opinion. -Bone marrow biopsy done by [facility] on  02/08/2019. Pathology was consistent with normocellular bone marrow with trilineage hematopoiesis. And 3% plasma cells. FISH was negative. Negative for amyloid deposits position by stain. -Cardiac MRI done at [facility] on 02/08/2019 showed the left ventricle moderately dilated in size with normal wall thickness. Global systolic function is moderately reduced with a LVEF of 44%. [facility] referred her to a cardiologist in their system for a second opinion Christy [name] sees this doctor on 03/06/2019.Christy [name] is following up with them closely. -Repeat labs done on 06/23/2019 showed M spike of 0.8 g/dL, creatinine 2.07, hemoglobin 10.8, ferritin 79, percent saturation 25   2. Anemia: -Likely from CKD and iron deficiency. -Last Feraheme on 07/07/2019 and 07/14/2019.  3. 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 -Follows up with nephrology.   PLAN:  1. 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 - M spike improved to 0.4 g from 0.7 previously.  Creatinine improved to 1.1  from 1.7 previously.  Calcium is 8.3. - Free light chain ratio improved to 1.28 from 1.89 previously.  Kappa light chains are 73.8. - Metastatic skeletal survey did not show any lytic lesions. - We will continue to monitor myeloma labs every 6 months.  Yearly skeletal survey.  2. Anemia: -Does not report any bleeding. - CBC shows hemoglobin 10.7, and drop in one-point from last hemoglobin.  Ferritin is 172 and percent saturation is 29.  No indication for Feraheme at this time.  RTC 4 months with repeat iron panel.  3. Renal insufficiency: -Creatinine is better at this time at 1.1, down from 1.7 previously.  Orders placed this encounter:  No orders of the defined types were placed in this encounter.    Derek Jack,  MD [facility] [phone]   I, Milinda Antis, am acting as a scribe for Dr. Sanda Linger.  I, Derek Jack MD, have reviewed the above documentation for accuracy and completeness, and I agree with the above.

## 2020-02-01 ENCOUNTER — Inpatient Hospital Stay (HOSPITAL_BASED_OUTPATIENT_CLINIC_OR_DEPARTMENT_OTHER): Payer: Medicare Other | Admitting: Hematology

## 2020-02-01 ENCOUNTER — Other Ambulatory Visit: Payer: Self-pay

## 2020-02-01 VITALS — BP 169/44 | HR 70 | Temp 97.3°F | Resp 20 | Wt 225.2 lb

## 2020-02-01 DIAGNOSIS — N184 Chronic kidney disease, stage 4 (severe): Secondary | ICD-10-CM | POA: Diagnosis not present

## 2020-02-01 DIAGNOSIS — D649 Anemia, unspecified: Secondary | ICD-10-CM | POA: Diagnosis not present

## 2020-02-01 DIAGNOSIS — I129 Hypertensive chronic kidney disease with stage 1 through stage 4 chronic kidney disease, or unspecified chronic kidney disease: Secondary | ICD-10-CM | POA: Diagnosis not present

## 2020-02-01 DIAGNOSIS — D472 Monoclonal gammopathy: Secondary | ICD-10-CM | POA: Diagnosis not present

## 2020-02-01 DIAGNOSIS — E1122 Type 2 diabetes mellitus with diabetic chronic kidney disease: Secondary | ICD-10-CM | POA: Diagnosis not present

## 2020-02-01 DIAGNOSIS — Z79899 Other long term (current) drug therapy: Secondary | ICD-10-CM | POA: Diagnosis not present

## 2020-02-01 NOTE — Patient Instructions (Signed)
[place] at [facility] Discharge Instructions  You were seen and examined today by Dr. Delton Coombes. Follow up as scheduled.   Thank you for choosing [place] at [facility] to provide your oncology and hematology care.  To afford each patient quality time with our provider, please arrive at least 15 minutes before your scheduled appointment time.   If you have a lab appointment with the [facility] please come in thru the Main Entrance and check in at the main information desk.  You need to re-schedule your appointment should you arrive 10 or more minutes late.  We strive to give you quality time with our providers, and arriving late affects you and other patients whose appointments are after yours.  Also, if you no show three or more times for appointments you may be dismissed from the clinic at the providers discretion.     Again, thank you for choosing [facility].  Our hope is that these requests will decrease the amount of time that you wait before being seen by our physicians.       _____________________________________________________________  Should you have questions after your visit to [facility], please contact our office at [phone] and follow the prompts.  Our office hours are 8:00 a.m. and 4:30 p.m. Monday - Friday.  Please note that voicemails left after 4:00 p.m. may not be returned until the following business day.  We are closed weekends and major holidays.  You do have access to a nurse 24-7, just call the main number to the clinic [phone] and do not press any options, hold on the line and a nurse will answer the phone.    For prescription refill requests, have your pharmacy contact our office and allow 72 hours.    Due to Covid, you will need to wear a mask upon entering the hospital. If you do not have a mask, a mask will be given to you at the Main Entrance upon arrival. For doctor  visits, patients may have 1 support person age 5 or older with them. For treatment visits, patients can not have anyone with them due to social distancing guidelines and our immunocompromised population.

## 2020-02-07 ENCOUNTER — Encounter (INDEPENDENT_AMBULATORY_CARE_PROVIDER_SITE_OTHER): Payer: Medicare Other | Admitting: Ophthalmology

## 2020-02-14 ENCOUNTER — Ambulatory Visit: Payer: Medicare Other | Admitting: Internal Medicine

## 2020-02-19 DIAGNOSIS — I10 Essential (primary) hypertension: Secondary | ICD-10-CM | POA: Diagnosis not present

## 2020-02-20 DIAGNOSIS — I4891 Unspecified atrial fibrillation: Secondary | ICD-10-CM | POA: Diagnosis not present

## 2020-02-20 DIAGNOSIS — Z299 Encounter for prophylactic measures, unspecified: Secondary | ICD-10-CM | POA: Diagnosis not present

## 2020-02-20 DIAGNOSIS — I10 Essential (primary) hypertension: Secondary | ICD-10-CM | POA: Diagnosis not present

## 2020-02-20 DIAGNOSIS — R809 Proteinuria, unspecified: Secondary | ICD-10-CM | POA: Diagnosis not present

## 2020-02-20 DIAGNOSIS — I509 Heart failure, unspecified: Secondary | ICD-10-CM | POA: Diagnosis not present

## 2020-02-20 DIAGNOSIS — E1129 Type 2 diabetes mellitus with other diabetic kidney complication: Secondary | ICD-10-CM | POA: Diagnosis not present

## 2020-02-21 ENCOUNTER — Other Ambulatory Visit: Payer: Self-pay

## 2020-02-21 ENCOUNTER — Encounter (INDEPENDENT_AMBULATORY_CARE_PROVIDER_SITE_OTHER): Payer: Medicare Other | Admitting: Ophthalmology

## 2020-02-21 DIAGNOSIS — H35033 Hypertensive retinopathy, bilateral: Secondary | ICD-10-CM | POA: Diagnosis not present

## 2020-02-21 DIAGNOSIS — I10 Essential (primary) hypertension: Secondary | ICD-10-CM | POA: Diagnosis not present

## 2020-02-21 DIAGNOSIS — E113391 Type 2 diabetes mellitus with moderate nonproliferative diabetic retinopathy without macular edema, right eye: Secondary | ICD-10-CM | POA: Diagnosis not present

## 2020-02-21 DIAGNOSIS — E113312 Type 2 diabetes mellitus with moderate nonproliferative diabetic retinopathy with macular edema, left eye: Secondary | ICD-10-CM

## 2020-02-21 DIAGNOSIS — H43813 Vitreous degeneration, bilateral: Secondary | ICD-10-CM | POA: Diagnosis not present

## 2020-03-18 DIAGNOSIS — I10 Essential (primary) hypertension: Secondary | ICD-10-CM | POA: Diagnosis not present

## 2020-03-20 ENCOUNTER — Other Ambulatory Visit: Payer: Self-pay

## 2020-03-20 ENCOUNTER — Ambulatory Visit (INDEPENDENT_AMBULATORY_CARE_PROVIDER_SITE_OTHER): Payer: Medicare Other | Admitting: Internal Medicine

## 2020-03-20 ENCOUNTER — Encounter: Payer: Self-pay | Admitting: Internal Medicine

## 2020-03-20 VITALS — BP 143/48 | HR 57 | Temp 97.3°F | Ht 63.0 in | Wt 219.8 lb

## 2020-03-20 DIAGNOSIS — K2980 Duodenitis without bleeding: Secondary | ICD-10-CM | POA: Diagnosis not present

## 2020-03-20 DIAGNOSIS — R1319 Other dysphagia: Secondary | ICD-10-CM

## 2020-03-20 DIAGNOSIS — K219 Gastro-esophageal reflux disease without esophagitis: Secondary | ICD-10-CM | POA: Insufficient documentation

## 2020-03-20 MED ORDER — DEXLANSOPRAZOLE 60 MG PO CPDR: 60.0000 mg | DELAYED_RELEASE_CAPSULE | Freq: Every day | ORAL | 5 refills | Status: DC

## 2020-03-20 NOTE — Progress Notes (Signed)
Dg es

## 2020-03-20 NOTE — Progress Notes (Signed)
Referring Provider: Monico Blitz, MD Primary Care Physician:  Monico Blitz, MD Primary GI:  Dr. Abbey Chatters  Chief Complaint  Patient presents with  . Follow-up    Still has problems swallowing at times. Has vomited a few times.     HPI:   Christy [name] is a 70 y.o. female who presents to clinic today for follow-up visit.  She has chronic reflux for which she takes omeprazole 20 mg twice daily.  She underwent EGD on 12/12/2019 which showed H. pylori negative gastritis, as well as multiple ulcerations in the duodenum.  Biopsies of duodenum consistent with severely active nonspecific duodenitis.  Patient denies any NSAID use.  She also has chronic dysphagia.  I performed esophageal dilation with a 19 mm balloon during her EGD and she did not get any benefit from this.  Continues to take omeprazole 20 mg twice daily and has breakthrough symptoms.  Past Medical History:  Diagnosis Date  . Bipolar disorder ([place])   . Coronary atherosclerosis of native coronary artery    BMS circ 2006, 70% PDA 2009  . Essential hypertension   . Mixed hyperlipidemia   . OSA on CPAP   . Renal tubular acidosis, type IV   . SVT (supraventricular tachycardia) ([place])   . Type 2 diabetes mellitus ([place])     Past Surgical History:  Procedure Laterality Date  . Acromioclavicular arthritis    . BALLOON DILATION N/A 12/12/2019   Procedure: BALLOON DILATION;  Surgeon: [name], DO;  [facility];  Service: Endoscopy;  Laterality: N/A;  . BIOPSY  12/12/2019   Procedure: BIOPSY;  Surgeon: [name], DO;  [facility];  Service: Endoscopy;;  . CARDIAC CATHETERIZATION N/A 03/29/2015   Procedure: Left Heart Cath and Coronary Angiography;  Surgeon: Troy Sine, MD;  Location: [facility];  Service: Cardiovascular;  Laterality: N/A;  . COLONOSCOPY WITH PROPOFOL N/A 12/12/2019   Procedure: COLONOSCOPY WITH PROPOFOL;  Surgeon: [name], DO;  [facility];   Service: Endoscopy;  Laterality: N/A;  2:30pm  . ESOPHAGOGASTRODUODENOSCOPY (EGD) WITH PROPOFOL N/A 12/12/2019   Procedure: ESOPHAGOGASTRODUODENOSCOPY (EGD) WITH PROPOFOL;  Surgeon: [name], DO;  [facility];  Service: Endoscopy;  Laterality: N/A;  . POLYPECTOMY  12/12/2019   Procedure: POLYPECTOMY;  Surgeon: [name], DO;  [facility];  Service: Endoscopy;;  . RIght shoulder adhesive capsulitis    . Rotator cuff impingement syndrome    . Rotator cuff tear      Current Outpatient Medications  Medication Sig Dispense Refill  . allopurinol (ZYLOPRIM) 300 MG tablet Take 150 mg by mouth daily.    Marland Kitchen amLODipine (NORVASC) 5 MG tablet Take 5 mg by mouth at bedtime.     Marland Kitchen aspirin 81 MG EC tablet Take 81 mg by mouth at bedtime.     Marland Kitchen BESIVANCE 0.6 % SUSP Place 1 drop into the left eye See admin instructions. Instill one drop into the left eye four times daily for 2 days after each monthly injection  12  . buPROPion (WELLBUTRIN XL) 300 MG 24 hr tablet Take 300 mg by mouth daily.    . calcium carbonate (OS-CAL) 600 MG TABS Take 600 mg by mouth daily.      . carvedilol (COREG) 12.5 MG tablet Take 12.5 mg by mouth 2 (two) times daily with a meal.    . Cholecalciferol (VITAMIN D3) 25 MCG (1000 UT) CAPS Take  1,000 Units by mouth daily.     . clotrimazole (LOTRIMIN) 1 % cream Apply 1 application topically daily as needed (itching).    Marland Kitchen dexlansoprazole (DEXILANT) 60 MG capsule Take 1 capsule (60 mg total) by mouth daily. 30 capsule 5  . divalproex (DEPAKOTE) 250 MG EC tablet Take 250 mg by mouth in the morning.    . hydrALAZINE (APRESOLINE) 50 MG tablet Take 50 mg by mouth 3 (three) times daily.    . isosorbide mononitrate (IMDUR) 60 MG 24 hr tablet Take 90 mg by mouth daily.    . meclizine (ANTIVERT) 25 MG tablet Take 25 mg by mouth 3 (three) times daily as needed for dizziness.     . metolazone (ZAROXOLYN) 5 MG tablet Take 5 mg by mouth daily as needed (shortness  of breath).     . Multiple Vitamin (MULTIVITAMIN) tablet Take 1 tablet by mouth daily.      . nitroGLYCERIN (NITROLINGUAL) 0.4 MG/SPRAY spray Place 1 spray under the tongue every 5 (five) minutes x 3 doses as needed. 12 g 2  . NOVOLOG FLEXPEN 100 UNIT/ML FlexPen Inject 5-12 Units into the skin 3 (three) times daily as needed for high blood sugar.     . rosuvastatin (CRESTOR) 10 MG tablet Take 10 mg by mouth daily.    . sertraline (ZOLOFT) 100 MG tablet Take 200 mg by mouth daily.    Marland Kitchen tiZANidine (ZANAFLEX) 4 MG tablet Take 4 mg by mouth every evening.    . torsemide (DEMADEX) 20 MG tablet Take 60 mg by mouth in the morning.     . traZODone (DESYREL) 100 MG tablet Take 100 mg by mouth at bedtime.   3  . TRESIBA FLEXTOUCH 200 UNIT/ML SOPN Inject 40 Units into the skin in the morning.  2  . VICTOZA 18 MG/3ML SOPN Inject 1.2 mg into the skin every evening.     Marland Kitchen GLOBAL EASE INJECT PEN NEEDLES 32G X 4 MM MISC See admin instructions.    Marland Kitchen losartan (COZAAR) 25 MG tablet Take 25 mg by mouth daily. (Patient not taking: Reported on 03/20/2020)    . potassium chloride (KLOR-CON) 10 MEQ tablet Take 10 mEq by mouth daily as needed (when taking metolazone).  (Patient not taking: Reported on 03/20/2020)     No current facility-administered medications for this visit.    Allergies as of 03/20/2020 - Review Complete 03/20/2020  Allergen Reaction Noted  . Ace inhibitors Cough 08/27/2010  . Lisinopril Cough 09/25/2015  . Penicillins  12/14/2007  . Tetracycline Nausea Only 12/14/2007  . Lamictal [lamotrigine] Rash 08/27/2010    Family History  Problem Relation Age of Onset  . Bipolar disorder Brother   . Diabetes Father     Social History   Socioeconomic History  . Marital status: Divorced    Spouse name: Not on file  . Number of children: Not on file  . Years of education: Not on file  . Highest education level: Not on file  Occupational History  . Occupation: PLANNING AND INSPECTOR    Employer:  [employer]  Tobacco Use  . Smoking status: Former Smoker    Packs/day: 1.00    Years: 20.00    Pack years: 20.00    Types: Cigarettes    Start date: 10/30/1984    Quit date: 07/08/2013    Years since quitting: 6.7  . Smokeless tobacco: Never Used  Substance and Sexual Activity  . Alcohol use: No    Alcohol/week: 0.0 standard  drinks  . Drug use: No  . Sexual activity: Not on file  Other Topics Concern  . Not on file  Social History Narrative   She has lived with some of her daughters. Works part-time for the city.    Social Determinants of Health   Financial Resource Strain: Not on file  Food Insecurity: Not on file  Transportation Needs: Not on file  Physical Activity: Not on file  Stress: Not on file  Social Connections: Not on file    Subjective: Review of Systems  Constitutional: Negative for chills and fever.  HENT: Negative for congestion and hearing loss.   Eyes: Negative for blurred vision and double vision.  Respiratory: Negative for cough and shortness of breath.   Cardiovascular: Negative for chest pain and palpitations.  Gastrointestinal: Positive for heartburn. Negative for abdominal pain, blood in stool, constipation, diarrhea, melena and vomiting.       Dysphagia  Genitourinary: Negative for dysuria and urgency.  Musculoskeletal: Negative for joint pain and myalgias.  Skin: Negative for itching and rash.  Neurological: Negative for dizziness and headaches.  Psychiatric/Behavioral: Negative for depression. The patient is not nervous/anxious.      Objective: BP (!) 143/48   Pulse (!) 57   Temp (!) 97.3 F (36.3 C) (Temporal)   Ht 5\' 3"  (1.6 m)   Wt 219 lb 12.8 oz (99.7 kg)   BMI 38.94 kg/m  Physical Exam Constitutional:      Appearance: Normal appearance. She is obese.  HENT:     Head: Normocephalic and atraumatic.  Eyes:     Extraocular Movements: Extraocular movements intact.     Conjunctiva/sclera: Conjunctivae normal.  Cardiovascular:      Rate and Rhythm: Normal rate and regular rhythm.  Pulmonary:     Effort: Pulmonary effort is normal.     Breath sounds: Normal breath sounds.  Abdominal:     General: Bowel sounds are normal.     Palpations: Abdomen is soft.  Musculoskeletal:        General: No swelling. Normal range of motion.     Cervical back: Normal range of motion and neck supple.  Skin:    General: Skin is warm and dry.     Coloration: Skin is not jaundiced.  Neurological:     General: No focal deficit present.     Mental Status: She is alert and oriented to person, place, and time.  Psychiatric:        Mood and Affect: Mood normal.        Behavior: Behavior normal.      Assessment: *Dysphagia-not improved status post dilation *Chronic reflux *Duodenal ulcerations, peptic duodenitis  Plan: For patient's ongoing dysphagia, we will order modified barium swallow to further evaluate.  Esophageal dilation did not seem to help her symptoms.  I am going to switch her omeprazole to Dexilant 60 mg daily.  Prescription sent to pharmacy.  Given extensive duodenal ulcerations, we will check gastrin level.  Patient to follow-up 2 to 3 months or sooner if needed.  03/20/2020 2:32 PM   Disclaimer: This note was dictated with voice recognition software. Similar sounding words can inadvertently be transcribed and may not be corrected upon review.

## 2020-03-20 NOTE — Patient Instructions (Signed)
We will order swallowing study to be performed.  They will call you to schedule this.  I am going to change your omeprazole to Dexilant 60 mg daily.  If this is too expensive or not covered by your insurance, just go back to omeprazole twice daily.  I am going to check a gastrin level to rule out hyper acid secretion syndrome.  Avoid NSAIDs.  At [facility] we value your feedback. You may receive a survey about your visit today. Please share your experience as we strive to create trusting relationships with our patients to provide genuine, compassionate, quality care.  We appreciate your understanding and patience as we review any laboratory studies, imaging, and other diagnostic tests that are ordered as we care for you. Our office policy is 5 business days for review of these results, and any emergent or urgent results are addressed in a timely manner for your best interest. If you do not hear from our office in 1 week, please contact us.   We also encourage the use of MyChart, which contains your medical information for your review as well. If you are not enrolled in this feature, an access code is on this after visit summary for your convenience. Thank you for allowing [place] to be involved in your care.  It was great to see you today!  I hope you have a great rest of your spring!!    Elon Alas. Abbey Chatters, D.O. Gastroenterology and Hepatology [facility]

## 2020-03-23 LAB — GASTRIN: Gastrin: 461 pg/mL — ABNORMAL HIGH (ref <=100)

## 2020-03-25 ENCOUNTER — Other Ambulatory Visit (HOSPITAL_COMMUNITY): Payer: Self-pay | Admitting: Specialist

## 2020-03-25 DIAGNOSIS — R1319 Other dysphagia: Secondary | ICD-10-CM

## 2020-03-27 ENCOUNTER — Other Ambulatory Visit: Payer: Self-pay

## 2020-03-27 ENCOUNTER — Ambulatory Visit (HOSPITAL_COMMUNITY)
Admission: RE | Admit: 2020-03-27 | Discharge: 2020-03-27 | Disposition: A | Discharge status: Home / Self Care | Payer: Medicare Other | Source: Ambulatory Visit | Attending: Internal Medicine | Admitting: Internal Medicine

## 2020-03-27 ENCOUNTER — Ambulatory Visit (HOSPITAL_COMMUNITY): Payer: Medicare Other | Attending: Internal Medicine | Admitting: Speech Pathology

## 2020-03-27 ENCOUNTER — Encounter (HOSPITAL_COMMUNITY): Payer: Self-pay | Admitting: Speech Pathology

## 2020-03-27 DIAGNOSIS — I5032 Chronic diastolic (congestive) heart failure: Secondary | ICD-10-CM | POA: Diagnosis not present

## 2020-03-27 DIAGNOSIS — R1319 Other dysphagia: Secondary | ICD-10-CM

## 2020-03-27 DIAGNOSIS — R809 Proteinuria, unspecified: Secondary | ICD-10-CM | POA: Diagnosis not present

## 2020-03-27 DIAGNOSIS — N170 Acute kidney failure with tubular necrosis: Secondary | ICD-10-CM | POA: Diagnosis not present

## 2020-03-27 DIAGNOSIS — N182 Chronic kidney disease, stage 2 (mild): Secondary | ICD-10-CM | POA: Diagnosis not present

## 2020-03-27 NOTE — Therapy (Signed)
[place], [place], [address] Phone: [phone]   Fax:  [phone]  Modified Barium Swallow  Patient Details  Name: Christy [name] MRN: [id number] Date of Birth: May 05, 1950 No data recorded  Encounter Date: 03/27/2020   End of Session - 03/27/20 1435    Visit Number 1    Number of Visits 1           Today's Date: 03/27/2020 Time: No data recorded-No data recorded No data recorded  Past Medical History:  Past Medical History:  Diagnosis Date  . Bipolar disorder ([place])   . Coronary atherosclerosis of native coronary artery    BMS circ 2006, 70% PDA 2009  . Essential hypertension   . Mixed hyperlipidemia   . OSA on CPAP   . Renal tubular acidosis, type IV   . SVT (supraventricular tachycardia) ([place])   . Type 2 diabetes mellitus ([place])    Past Surgical History:  Past Surgical History:  Procedure Laterality Date  . Acromioclavicular arthritis    . BALLOON DILATION N/A 12/12/2019   Procedure: BALLOON DILATION;  Surgeon: [name], DO;  [facility];  Service: Endoscopy;  Laterality: N/A;  . BIOPSY  12/12/2019   Procedure: BIOPSY;  Surgeon: [name], DO;  [facility];  Service: Endoscopy;;  . CARDIAC CATHETERIZATION N/A 03/29/2015   Procedure: Left Heart Cath and Coronary Angiography;  Surgeon: Troy Sine, MD;  Location: [facility];  Service: Cardiovascular;  Laterality: N/A;  . COLONOSCOPY WITH PROPOFOL N/A 12/12/2019   Procedure: COLONOSCOPY WITH PROPOFOL;  Surgeon: [name], DO;  [facility];  Service: Endoscopy;  Laterality: N/A;  2:30pm  . ESOPHAGOGASTRODUODENOSCOPY (EGD) WITH PROPOFOL N/A 12/12/2019   Procedure: ESOPHAGOGASTRODUODENOSCOPY (EGD) WITH PROPOFOL;  Surgeon: [name], DO;  [facility];  Service: Endoscopy;  Laterality: N/A;  . POLYPECTOMY  12/12/2019   Procedure: POLYPECTOMY;  Surgeon: [name], DO;  [facility];  Service: Endoscopy;;  . RIght shoulder adhesive capsulitis    . Rotator cuff impingement syndrome    . Rotator cuff tear     HPI: Christy [name] is a 70 y.o. female who presents to clinic today for follow-up visit.  She has chronic reflux for which she takes omeprazole 20 mg twice daily.  She underwent EGD on 12/12/2019 which showed H. pylori negative gastritis, as well as multiple ulcerations in the duodenum.  Biopsies of duodenum consistent with severely active nonspecific duodenitis. Pt reports frequent globus sensation and no "choking" or getting "strangled" but she does report occasionally choughing and spitting up when food gets "stuck".   No data recorded   Assessment / Plan / Recommendation  CHL IP CLINICAL IMPRESSIONS 03/27/2020  Clinical Impression Pt's oropharyngeal swallowing today presented as grossly WFL. She did demonstrate occasional flash penetration that is likely a baseline difference in her swallowing. She demonstrated good hyolaryngeal excursion, adequate laryngeal squeeze and a timely swallow. With the pill administration, the barium tablet was visualized passing through the pharynx and the esophageal sweep revealed what appeared to be stasis in the distal esophagus with a standing column of barium with to and fro movements; radiologist present to confirm; Barium tablet was NOT visualized passing through completely. Recommend continue with regular diet and thin liquids; consider trying meds whole with applesauce and SLP reviewed esophageal precautions with the patient. Recommend consider barium swallow assessment (esophagram) and further GI  treatment; defer to GI for further diagnosis and recommendations. There are no further ST needs, thank you.  SLP Visit Diagnosis Dysphagia, oropharyngeal phase (R13.12)  Attention and concentration deficit following --  Frontal lobe and executive function deficit following --  Impact on safety and  function --      CHL IP TREATMENT RECOMMENDATION 03/27/2020  Treatment Recommendations No treatment recommended at this time     No flowsheet data found.  CHL IP DIET RECOMMENDATION 03/27/2020  SLP Diet Recommendations Regular solids;Thin liquid  Liquid Administration via Cup;Straw  Medication Administration Whole meds with liquid  Compensations --  Postural Changes --      CHL IP OTHER RECOMMENDATIONS 03/27/2020  Recommended Consults Consider GI evaluation;Consider esophageal assessment  Oral Care Recommendations Oral care BID  Other Recommendations --      CHL IP FOLLOW UP RECOMMENDATIONS 03/27/2020  Follow up Recommendations None      No flowsheet data found.         CHL IP ORAL PHASE 03/27/2020  Oral Phase WFL  Oral - Pudding Teaspoon --  Oral - Pudding Cup --  Oral - Honey Teaspoon --  Oral - Honey Cup --  Oral - Nectar Teaspoon --  Oral - Nectar Cup --  Oral - Nectar Straw --  Oral - Thin Teaspoon --  Oral - Thin Cup --  Oral - Thin Straw --  Oral - Puree --  Oral - Mech Soft --  Oral - Regular --  Oral - Multi-Consistency --  Oral - Pill --  Oral Phase - Comment --    CHL IP PHARYNGEAL PHASE 03/27/2020  Pharyngeal Phase WFL  Pharyngeal- Pudding Teaspoon --  Pharyngeal --  Pharyngeal- Pudding Cup --  Pharyngeal --  Pharyngeal- Honey Teaspoon --  Pharyngeal --  Pharyngeal- Honey Cup --  Pharyngeal --  Pharyngeal- Nectar Teaspoon --  Pharyngeal --  Pharyngeal- Nectar Cup --  Pharyngeal --  Pharyngeal- Nectar Straw --  Pharyngeal --  Pharyngeal- Thin Teaspoon --  Pharyngeal --  Pharyngeal- Thin Cup --  Pharyngeal --  Pharyngeal- Thin Straw --  Pharyngeal --  Pharyngeal- Puree --  Pharyngeal --  Pharyngeal- Mechanical Soft --  Pharyngeal --  Pharyngeal- Regular --  Pharyngeal --  Pharyngeal- Multi-consistency --  Pharyngeal --  Pharyngeal- Pill --  Pharyngeal --  Pharyngeal Comment --     No flowsheet data found.   Wende Bushy 03/27/2020, 2:36 PM              Almetta Lovely. Roddie Mc, CCC-SLP Speech Language Pathologist           Wende Bushy 03/27/2020, 2:36 PM  [facility] [address] [place], [place], [address] Phone: [phone]   Fax:  [phone]  Name: Christy [name] MRN: [id number] Date of Birth: [birth year]

## 2020-03-28 DIAGNOSIS — L609 Nail disorder, unspecified: Secondary | ICD-10-CM | POA: Diagnosis not present

## 2020-03-28 DIAGNOSIS — E1140 Type 2 diabetes mellitus with diabetic neuropathy, unspecified: Secondary | ICD-10-CM | POA: Diagnosis not present

## 2020-03-28 DIAGNOSIS — L110 Acquired keratosis follicularis: Secondary | ICD-10-CM | POA: Diagnosis not present

## 2020-04-03 DIAGNOSIS — F3112 Bipolar disorder, current episode manic without psychotic features, moderate: Secondary | ICD-10-CM | POA: Diagnosis not present

## 2020-04-03 DIAGNOSIS — F4310 Post-traumatic stress disorder, unspecified: Secondary | ICD-10-CM | POA: Diagnosis not present

## 2020-04-03 DIAGNOSIS — F411 Generalized anxiety disorder: Secondary | ICD-10-CM | POA: Diagnosis not present

## 2020-04-04 DIAGNOSIS — I5032 Chronic diastolic (congestive) heart failure: Secondary | ICD-10-CM | POA: Diagnosis not present

## 2020-04-04 DIAGNOSIS — E1129 Type 2 diabetes mellitus with other diabetic kidney complication: Secondary | ICD-10-CM | POA: Diagnosis not present

## 2020-04-04 DIAGNOSIS — W19XXXA Unspecified fall, initial encounter: Secondary | ICD-10-CM | POA: Diagnosis not present

## 2020-04-04 DIAGNOSIS — N189 Chronic kidney disease, unspecified: Secondary | ICD-10-CM | POA: Diagnosis not present

## 2020-04-04 DIAGNOSIS — R809 Proteinuria, unspecified: Secondary | ICD-10-CM | POA: Diagnosis not present

## 2020-04-04 DIAGNOSIS — I129 Hypertensive chronic kidney disease with stage 1 through stage 4 chronic kidney disease, or unspecified chronic kidney disease: Secondary | ICD-10-CM | POA: Diagnosis not present

## 2020-04-04 DIAGNOSIS — E1122 Type 2 diabetes mellitus with diabetic chronic kidney disease: Secondary | ICD-10-CM | POA: Diagnosis not present

## 2020-04-04 DIAGNOSIS — D638 Anemia in other chronic diseases classified elsewhere: Secondary | ICD-10-CM | POA: Diagnosis not present

## 2020-04-05 DIAGNOSIS — I10 Essential (primary) hypertension: Secondary | ICD-10-CM | POA: Diagnosis not present

## 2020-04-05 DIAGNOSIS — Z1331 Encounter for screening for depression: Secondary | ICD-10-CM | POA: Diagnosis not present

## 2020-04-05 DIAGNOSIS — Z7189 Other specified counseling: Secondary | ICD-10-CM | POA: Diagnosis not present

## 2020-04-05 DIAGNOSIS — Z0000 Encounter for general adult medical examination without abnormal findings: Secondary | ICD-10-CM | POA: Diagnosis not present

## 2020-04-05 DIAGNOSIS — Z299 Encounter for prophylactic measures, unspecified: Secondary | ICD-10-CM | POA: Diagnosis not present

## 2020-04-05 DIAGNOSIS — E1165 Type 2 diabetes mellitus with hyperglycemia: Secondary | ICD-10-CM | POA: Diagnosis not present

## 2020-04-05 DIAGNOSIS — I471 Supraventricular tachycardia: Secondary | ICD-10-CM | POA: Diagnosis not present

## 2020-04-05 DIAGNOSIS — Z6841 Body Mass Index (BMI) 40.0 and over, adult: Secondary | ICD-10-CM | POA: Diagnosis not present

## 2020-04-05 DIAGNOSIS — Z1339 Encounter for screening examination for other mental health and behavioral disorders: Secondary | ICD-10-CM | POA: Diagnosis not present

## 2020-04-08 ENCOUNTER — Other Ambulatory Visit: Payer: Self-pay

## 2020-04-08 DIAGNOSIS — R1319 Other dysphagia: Secondary | ICD-10-CM

## 2020-04-09 ENCOUNTER — Encounter: Payer: Self-pay | Admitting: Internal Medicine

## 2020-04-15 ENCOUNTER — Ambulatory Visit (HOSPITAL_COMMUNITY)
Admission: RE | Admit: 2020-04-15 | Discharge: 2020-04-15 | Disposition: A | Discharge status: Home / Self Care | Payer: Medicare Other | Source: Ambulatory Visit | Attending: Internal Medicine | Admitting: Internal Medicine

## 2020-04-15 DIAGNOSIS — R1319 Other dysphagia: Secondary | ICD-10-CM

## 2020-04-15 DIAGNOSIS — R1310 Dysphagia, unspecified: Secondary | ICD-10-CM | POA: Diagnosis not present

## 2020-04-18 DIAGNOSIS — I10 Essential (primary) hypertension: Secondary | ICD-10-CM | POA: Diagnosis not present

## 2020-04-23 DIAGNOSIS — R29898 Other symptoms and signs involving the musculoskeletal system: Secondary | ICD-10-CM | POA: Diagnosis not present

## 2020-04-23 DIAGNOSIS — R2681 Unsteadiness on feet: Secondary | ICD-10-CM | POA: Diagnosis not present

## 2020-05-01 DIAGNOSIS — R29898 Other symptoms and signs involving the musculoskeletal system: Secondary | ICD-10-CM | POA: Diagnosis not present

## 2020-05-01 DIAGNOSIS — R2681 Unsteadiness on feet: Secondary | ICD-10-CM | POA: Diagnosis not present

## 2020-05-07 DIAGNOSIS — R2681 Unsteadiness on feet: Secondary | ICD-10-CM | POA: Diagnosis not present

## 2020-05-07 DIAGNOSIS — R29898 Other symptoms and signs involving the musculoskeletal system: Secondary | ICD-10-CM | POA: Diagnosis not present

## 2020-05-14 DIAGNOSIS — R29898 Other symptoms and signs involving the musculoskeletal system: Secondary | ICD-10-CM | POA: Diagnosis not present

## 2020-05-14 DIAGNOSIS — R2681 Unsteadiness on feet: Secondary | ICD-10-CM | POA: Diagnosis not present

## 2020-05-17 DIAGNOSIS — I10 Essential (primary) hypertension: Secondary | ICD-10-CM | POA: Diagnosis not present

## 2020-05-21 DIAGNOSIS — R2681 Unsteadiness on feet: Secondary | ICD-10-CM | POA: Diagnosis not present

## 2020-05-21 DIAGNOSIS — R29898 Other symptoms and signs involving the musculoskeletal system: Secondary | ICD-10-CM | POA: Diagnosis not present

## 2020-05-28 DIAGNOSIS — R2681 Unsteadiness on feet: Secondary | ICD-10-CM | POA: Diagnosis not present

## 2020-05-29 DIAGNOSIS — N186 End stage renal disease: Secondary | ICD-10-CM | POA: Diagnosis not present

## 2020-05-29 DIAGNOSIS — I129 Hypertensive chronic kidney disease with stage 1 through stage 4 chronic kidney disease, or unspecified chronic kidney disease: Secondary | ICD-10-CM | POA: Diagnosis not present

## 2020-05-29 DIAGNOSIS — D638 Anemia in other chronic diseases classified elsewhere: Secondary | ICD-10-CM | POA: Diagnosis not present

## 2020-05-29 DIAGNOSIS — I5032 Chronic diastolic (congestive) heart failure: Secondary | ICD-10-CM | POA: Diagnosis not present

## 2020-05-29 DIAGNOSIS — E1129 Type 2 diabetes mellitus with other diabetic kidney complication: Secondary | ICD-10-CM | POA: Diagnosis not present

## 2020-05-29 DIAGNOSIS — E1122 Type 2 diabetes mellitus with diabetic chronic kidney disease: Secondary | ICD-10-CM | POA: Diagnosis not present

## 2020-05-29 DIAGNOSIS — R809 Proteinuria, unspecified: Secondary | ICD-10-CM | POA: Diagnosis not present

## 2020-05-29 DIAGNOSIS — N189 Chronic kidney disease, unspecified: Secondary | ICD-10-CM | POA: Diagnosis not present

## 2020-05-30 ENCOUNTER — Inpatient Hospital Stay (HOSPITAL_COMMUNITY): Payer: Medicare Other | Attending: Hematology

## 2020-05-30 ENCOUNTER — Other Ambulatory Visit: Payer: Self-pay

## 2020-05-30 DIAGNOSIS — E669 Obesity, unspecified: Secondary | ICD-10-CM | POA: Diagnosis not present

## 2020-05-30 DIAGNOSIS — D649 Anemia, unspecified: Secondary | ICD-10-CM | POA: Diagnosis not present

## 2020-05-30 DIAGNOSIS — E1122 Type 2 diabetes mellitus with diabetic chronic kidney disease: Secondary | ICD-10-CM | POA: Diagnosis not present

## 2020-05-30 DIAGNOSIS — Z87891 Personal history of nicotine dependence: Secondary | ICD-10-CM | POA: Insufficient documentation

## 2020-05-30 DIAGNOSIS — D472 Monoclonal gammopathy: Secondary | ICD-10-CM | POA: Insufficient documentation

## 2020-05-30 DIAGNOSIS — I129 Hypertensive chronic kidney disease with stage 1 through stage 4 chronic kidney disease, or unspecified chronic kidney disease: Secondary | ICD-10-CM | POA: Insufficient documentation

## 2020-05-30 DIAGNOSIS — N184 Chronic kidney disease, stage 4 (severe): Secondary | ICD-10-CM | POA: Diagnosis not present

## 2020-05-30 LAB — CBC WITH DIFFERENTIAL/PLATELET
Abs Immature Granulocytes: 0.01 10*3/uL (ref 0.00–0.07)
Basophils Absolute: 0.1 10*3/uL (ref 0.0–0.1)
Basophils Relative: 1 %
Eosinophils Absolute: 0.5 10*3/uL (ref 0.0–0.5)
Eosinophils Relative: 9 %
HCT: 32.1 % — ABNORMAL LOW (ref 36.0–46.0)
Hemoglobin: 10.2 g/dL — ABNORMAL LOW (ref 12.0–15.0)
Immature Granulocytes: 0 %
Lymphocytes Relative: 28 %
Lymphs Abs: 1.6 10*3/uL (ref 0.7–4.0)
MCH: 31.4 pg (ref 26.0–34.0)
MCHC: 31.8 g/dL (ref 30.0–36.0)
MCV: 98.8 fL (ref 80.0–100.0)
Monocytes Absolute: 0.4 10*3/uL (ref 0.1–1.0)
Monocytes Relative: 7 %
Neutro Abs: 3.2 10*3/uL (ref 1.7–7.7)
Neutrophils Relative %: 55 %
Platelets: 164 10*3/uL (ref 150–400)
RBC: 3.25 MIL/uL — ABNORMAL LOW (ref 3.87–5.11)
RDW: 13.1 % (ref 11.5–15.5)
WBC: 5.8 10*3/uL (ref 4.0–10.5)
nRBC: 0 % (ref 0.0–0.2)

## 2020-05-30 LAB — IRON AND TIBC
Iron: 62 ug/dL (ref 28–170)
Saturation Ratios: 27 % (ref 10.4–31.8)
TIBC: 228 ug/dL — ABNORMAL LOW (ref 250–450)
UIBC: 166 ug/dL

## 2020-05-30 LAB — FERRITIN: Ferritin: 170 ng/mL (ref 11–307)

## 2020-06-03 DIAGNOSIS — L110 Acquired keratosis follicularis: Secondary | ICD-10-CM | POA: Diagnosis not present

## 2020-06-03 DIAGNOSIS — E1140 Type 2 diabetes mellitus with diabetic neuropathy, unspecified: Secondary | ICD-10-CM | POA: Diagnosis not present

## 2020-06-03 DIAGNOSIS — L609 Nail disorder, unspecified: Secondary | ICD-10-CM | POA: Diagnosis not present

## 2020-06-04 DIAGNOSIS — R2681 Unsteadiness on feet: Secondary | ICD-10-CM | POA: Diagnosis not present

## 2020-06-05 DIAGNOSIS — I5032 Chronic diastolic (congestive) heart failure: Secondary | ICD-10-CM | POA: Diagnosis not present

## 2020-06-05 DIAGNOSIS — D638 Anemia in other chronic diseases classified elsewhere: Secondary | ICD-10-CM | POA: Diagnosis not present

## 2020-06-05 DIAGNOSIS — E1129 Type 2 diabetes mellitus with other diabetic kidney complication: Secondary | ICD-10-CM | POA: Diagnosis not present

## 2020-06-05 DIAGNOSIS — R809 Proteinuria, unspecified: Secondary | ICD-10-CM | POA: Diagnosis not present

## 2020-06-05 DIAGNOSIS — E211 Secondary hyperparathyroidism, not elsewhere classified: Secondary | ICD-10-CM | POA: Diagnosis not present

## 2020-06-05 DIAGNOSIS — E1122 Type 2 diabetes mellitus with diabetic chronic kidney disease: Secondary | ICD-10-CM | POA: Diagnosis not present

## 2020-06-05 DIAGNOSIS — N189 Chronic kidney disease, unspecified: Secondary | ICD-10-CM | POA: Diagnosis not present

## 2020-06-05 NOTE — Progress Notes (Signed)
[address] [place], [place] [address]   CLINIC:  Medical Oncology/Hematology  PCP:  Monico Blitz, [place] [address]  [phone]  REASON FOR VISIT:  Follow-up for MGUS  PRIOR THERAPY: none  CURRENT THERAPY: observation  INTERVAL HISTORY:  Christy [name], a 69 y.o. female, returns for routine follow-up for her MGUS. Pier was last seen on 02/01/2020.  Today Christy [name] reports feeling well. Christy [name] had a EGD and colonoscopy in November. Christy [name] denies any bloody or black stools. Christy [name] denies any new bone pains or infections. Christy [name] is easily bruising on her arms but nowhere else on her body.   REVIEW OF SYSTEMS:  Review of Systems  Constitutional: Positive for fatigue (25%). Negative for appetite change.  HENT:   Positive for trouble swallowing.   Gastrointestinal: Negative for blood in stool.  All other systems reviewed and are negative.   PAST MEDICAL/SURGICAL HISTORY:  Past Medical History:  Diagnosis Date  . Bipolar disorder ([place])   . Coronary atherosclerosis of native coronary artery    BMS circ 2006, 70% PDA 2009  . Essential hypertension   . Mixed hyperlipidemia   . OSA on CPAP   . Renal tubular acidosis, type IV   . SVT (supraventricular tachycardia) ([place])   . Type 2 diabetes mellitus ([place])    Past Surgical History:  Procedure Laterality Date  . Acromioclavicular arthritis    . BALLOON DILATION N/A 12/12/2019   Procedure: BALLOON DILATION;  Surgeon: [name], DO;  [facility];  Service: Endoscopy;  Laterality: N/A;  . BIOPSY  12/12/2019   Procedure: BIOPSY;  Surgeon: [name], DO;  [facility];  Service: Endoscopy;;  . CARDIAC CATHETERIZATION N/A 03/29/2015   Procedure: Left Heart Cath and Coronary Angiography;  Surgeon: Troy Sine, MD;  Location: [facility];  Service: Cardiovascular;  Laterality: N/A;  . COLONOSCOPY WITH PROPOFOL N/A 12/12/2019   Procedure: COLONOSCOPY  WITH PROPOFOL;  Surgeon: [name], DO;  [facility];  Service: Endoscopy;  Laterality: N/A;  2:30pm  . ESOPHAGOGASTRODUODENOSCOPY (EGD) WITH PROPOFOL N/A 12/12/2019   Procedure: ESOPHAGOGASTRODUODENOSCOPY (EGD) WITH PROPOFOL;  Surgeon: [name], DO;  [facility];  Service: Endoscopy;  Laterality: N/A;  . POLYPECTOMY  12/12/2019   Procedure: POLYPECTOMY;  Surgeon: [name], DO;  [facility];  Service: Endoscopy;;  . RIght shoulder adhesive capsulitis    . Rotator cuff impingement syndrome    . Rotator cuff tear      SOCIAL HISTORY:  Social History   Socioeconomic History  . Marital status: Divorced    Spouse name: Not on file  . Number of children: Not on file  . Years of education: Not on file  . Highest education level: Not on file  Occupational History  . Occupation: PLANNING AND INSPECTOR    Employer: [employer]  Tobacco Use  . Smoking status: Former Smoker    Packs/day: 1.00    Years: 20.00    Pack years: 20.00    Types: Cigarettes    Start date: 10/30/1984    Quit date: 07/08/2013    Years since quitting: 6.9  . Smokeless tobacco: Never Used  Substance and Sexual Activity  . Alcohol use: No    Alcohol/week: 0.0 standard drinks  . Drug use: No  . Sexual activity: Not on file  Other Topics Concern  . Not on  file  Social History Narrative   Christy [name] has lived with some of her daughters. Works part-time for the city.    Social Determinants of Health   Financial Resource Strain: Not on file  Food Insecurity: Not on file  Transportation Needs: Not on file  Physical Activity: Not on file  Stress: Not on file  Social Connections: Not on file  Intimate Partner Violence: Not on file    FAMILY HISTORY:  Family History  Problem Relation Age of Onset  . Bipolar disorder Brother   . Diabetes Father     CURRENT MEDICATIONS:  Current Outpatient Medications  Medication Sig Dispense Refill  . allopurinol  (ZYLOPRIM) 300 MG tablet Take 150 mg by mouth daily.    Marland Kitchen amLODipine (NORVASC) 5 MG tablet Take 5 mg by mouth at bedtime.     Marland Kitchen aspirin 81 MG EC tablet Take 81 mg by mouth at bedtime.     Marland Kitchen BESIVANCE 0.6 % SUSP Place 1 drop into the left eye See admin instructions. Instill one drop into the left eye four times daily for 2 days after each monthly injection  12  . buPROPion (WELLBUTRIN XL) 300 MG 24 hr tablet Take 300 mg by mouth daily.    . calcium carbonate (OS-CAL) 600 MG TABS Take 600 mg by mouth daily.      . carvedilol (COREG) 12.5 MG tablet Take 12.5 mg by mouth 2 (two) times daily with a meal.    . Cholecalciferol (VITAMIN D3) 25 MCG (1000 UT) CAPS Take 1,000 Units by mouth daily.     . clotrimazole (LOTRIMIN) 1 % cream Apply 1 application topically daily as needed (itching).    Marland Kitchen dexlansoprazole (DEXILANT) 60 MG capsule Take 1 capsule (60 mg total) by mouth daily. 30 capsule 5  . divalproex (DEPAKOTE) 250 MG EC tablet Take 250 mg by mouth in the morning.    Marland Kitchen GLOBAL EASE INJECT PEN NEEDLES 32G X 4 MM MISC See admin instructions.    . hydrALAZINE (APRESOLINE) 50 MG tablet Take 50 mg by mouth 3 (three) times daily.    . isosorbide mononitrate (IMDUR) 60 MG 24 hr tablet Take 90 mg by mouth daily.    Marland Kitchen losartan (COZAAR) 25 MG tablet Take 25 mg by mouth daily. (Patient not taking: Reported on 03/20/2020)    . meclizine (ANTIVERT) 25 MG tablet Take 25 mg by mouth 3 (three) times daily as needed for dizziness.     . metolazone (ZAROXOLYN) 5 MG tablet Take 5 mg by mouth daily as needed (shortness of breath).     . Multiple Vitamin (MULTIVITAMIN) tablet Take 1 tablet by mouth daily.      . nitroGLYCERIN (NITROLINGUAL) 0.4 MG/SPRAY spray Place 1 spray under the tongue every 5 (five) minutes x 3 doses as needed. 12 g 2  . NOVOLOG FLEXPEN 100 UNIT/ML FlexPen Inject 5-12 Units into the skin 3 (three) times daily as needed for high blood sugar.     . potassium chloride (KLOR-CON) 10 MEQ tablet Take 10 mEq  by mouth daily as needed (when taking metolazone).  (Patient not taking: Reported on 03/20/2020)    . rosuvastatin (CRESTOR) 10 MG tablet Take 10 mg by mouth daily.    . sertraline (ZOLOFT) 100 MG tablet Take 200 mg by mouth daily.    Marland Kitchen tiZANidine (ZANAFLEX) 4 MG tablet Take 4 mg by mouth every evening.    . torsemide (DEMADEX) 20 MG tablet Take 60 mg by mouth in the morning.     Marland Kitchen  traZODone (DESYREL) 100 MG tablet Take 100 mg by mouth at bedtime.   3  . TRESIBA FLEXTOUCH 200 UNIT/ML SOPN Inject 40 Units into the skin in the morning.  2  . VICTOZA 18 MG/3ML SOPN Inject 1.2 mg into the skin every evening.      No current facility-administered medications for this visit.    ALLERGIES:  Allergies  Allergen Reactions  . Ace Inhibitors Cough  . Lisinopril Cough    Severe cough  . Penicillins     Unknown, childhood reaction  . Tetracycline Nausea Only  . Lamictal [Lamotrigine] Rash    Rash start inside to outter body.    PHYSICAL EXAM:  Performance status (ECOG): 1 - Symptomatic but completely ambulatory  There were no vitals filed for this visit. Wt Readings from Last 3 Encounters:  03/20/20 219 lb 12.8 oz (99.7 kg)  02/01/20 225 lb 3.2 oz (102.2 kg)  10/26/19 221 lb 6.4 oz (100.4 kg)   Physical Exam Vitals reviewed.  Constitutional:      Appearance: Normal appearance.  Cardiovascular:     Rate and Rhythm: Normal rate and regular rhythm.     Pulses: Normal pulses.     Heart sounds: Normal heart sounds.  Pulmonary:     Effort: Pulmonary effort is normal.     Breath sounds: Normal breath sounds.  Neurological:     General: No focal deficit present.     Mental Status: Christy [name] is alert and oriented to person, place, and time.  Psychiatric:        Mood and Affect: Mood normal.        Behavior: Behavior normal.     LABORATORY DATA:  I have reviewed the labs as listed.  CBC Latest Ref Rng & Units 05/30/2020 01/25/2020 09/07/2019  WBC 4.0 - 10.5 K/uL 5.8 10.6(H) 7.8  Hemoglobin  12.0 - 15.0 g/dL 10.2(L) 10.7(L) 11.9(L)  Hematocrit 36.0 - 46.0 % 32.1(L) 34.5(L) 37.7  Platelets 150 - 400 K/uL 164 196 199   CMP Latest Ref Rng & Units 01/25/2020 09/07/2019 06/23/2019  Glucose 70 - 99 mg/dL 131(H) 122(H) 256(H)  BUN 8 - 23 mg/dL 31(H) 62(H) 74(H)  Creatinine 0.44 - 1.00 mg/dL 1.10(H) 1.77(H) 2.07(H)  Sodium 135 - 145 mmol/L 140 136 137  Potassium 3.5 - 5.1 mmol/L 3.7 4.6 4.5  Chloride 98 - 111 mmol/L 104 98 97(L)  CO2 22 - 32 mmol/L _0 Calcium 8.9 - 10.3 mg/dL 8.3(L) 9.5 8.6(L)  Total Protein 6.5 - 8.1 g/dL 5.9(L) 7.4 6.8  Total Bilirubin 0.3 - 1.2 mg/dL 0.6 0.3 0.5  Alkaline Phos 38 - 126 U/L 77 45 47  AST 15 - 41 U/L _1 ALT 0 - 44 U/L _2 Component Value Date/Time   RBC 3.25 (L) 05/30/2020 1252   MCV 98.8 05/30/2020 1252   MCH 31.4 05/30/2020 1252   MCHC 31.8 05/30/2020 1252   RDW 13.1 05/30/2020 1252   LYMPHSABS 1.6 05/30/2020 1252   MONOABS 0.4 05/30/2020 1252   EOSABS 0.5 05/30/2020 1252   BASOSABS 0.1 05/30/2020 1252    DIAGNOSTIC IMAGING:  I have independently reviewed the scans and discussed with the patient. No results found.   ASSESSMENT:  1. 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 Christy [name] was noted to have an elevated creatinine and was seen by nephrologist in December 2014. Christy [name] had an SPEP done  as part of her work-up for kidney disease and was noted to have a M spike of 0.6 with normal lambda light chain ratio. Immunofixation showed IgG kappa monoclonal protein. Patient had hep C antibody which was negative, ANA negative and C ANCA negative, P-ANCA negative, anti-double-stranded DNA antibody negative and complements were within normal limits. Her creatinine at that time was 1.32 and hemoglobin was 11.8. Also Christy [name] had no significant protein in her urine. -Christy [name] also had a bone scan which was noted to be negative. Christy [name] never had a bone marrow  biopsy done at this time. -Patient was being followed by Dr. Tressie Stalker at our clinic since early 2017. -Labs on 06/01/2018 shows potassium 5.1, creatinine 1.67, calcium 8.6, LFTs were normal, LDH 213, SPEP stable with M spike 0.6 g/dL. WBC 7.8, hemoglobin 9.9, platelets 156. Light chain ratio 1.82. -Labs on 07/05/2018 showed her potassium 5.5, creatinine 1.72, calcium 8.7, hemoglobin 10.1. -We repeated her potassium at her visit on 07/12/2018 which showed her potassium 4.8. -Christy [name] reports no B symptoms including fevers, chills, night sweats, or unexplained weight loss. -Skeletal survey done on 07/05/2018 showed no lytic lesions. -Bone marrow biopsy done on 11/25/2018. Pathology consistent with hypercellular marrow with trilineage hematopoiesis and 2% plasma cells. FISH was negative. Cytogenetics was normal. -When trending her anemia and renal function since 2017 it has progressively gotten worse. Which is concerning for multiple myeloma. Patient was sent to [facility] for second opinion. -Bone marrow biopsy done by [facility] on 02/08/2019. Pathology was consistent with normocellular bone marrow with trilineage hematopoiesis. And 3% plasma cells. FISH was negative. Negative for amyloid deposits position by stain. -Cardiac MRI done at [facility] on 02/08/2019 showed the left ventricle moderately dilated in size with normal wall thickness. Global systolic function is moderately reduced with a LVEF of 44%. [facility] referred her to a cardiologist in their system for a second opinion Christy [name] sees this doctor on 03/06/2019.Christy [name] is following up with them closely. -Repeat labs done on 06/23/2019 showed M spike of 0.8 g/dL, creatinine 2.07, hemoglobin 10.8, ferritin 79, percent saturation 25   2. Anemia: -Likely from CKD and iron deficiency. -Last Feraheme on 07/07/2019 and 07/14/2019.  3. 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 -Follows up with nephrology.  PLAN:  1. 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  Free light chain ratio has improved to 1.28 from 1.89 previously. - Skeletal survey in January 2022 was negative for any lytic lesions. - Christy [name] does not have any repeated infections or new onset bone pains. - I plan to repeat her myeloma panel in 3 months.  We will repeat skeletal survey in 1 year.  2. Anemia: -Christy [name] does not report any bleeding per rectum or melena. - Labs from 05/30/2020 shows hemoglobin 10.2 and stable.  Ferritin is 170 and stable.  Percent saturation is 27. - Plan to repeat iron panel, N36 and folic acid in 3 months.  3. Renal insufficiency: -Last creatinine was 1.1 in January.  Orders placed this encounter:  No orders of the defined types were placed in this encounter.    Derek Jack, MD [place] [phone]   I, Thana Ates, am acting as a scribe for Dr. Derek Jack.  I, Derek Jack MD, have reviewed the above documentation for accuracy and completeness, and  I agree with the above.

## 2020-06-06 ENCOUNTER — Inpatient Hospital Stay (HOSPITAL_BASED_OUTPATIENT_CLINIC_OR_DEPARTMENT_OTHER): Payer: Medicare Other | Admitting: Hematology

## 2020-06-06 VITALS — BP 166/45 | HR 56 | Temp 97.3°F | Resp 18 | Wt 219.4 lb

## 2020-06-06 DIAGNOSIS — E669 Obesity, unspecified: Secondary | ICD-10-CM | POA: Diagnosis not present

## 2020-06-06 DIAGNOSIS — D472 Monoclonal gammopathy: Secondary | ICD-10-CM

## 2020-06-06 DIAGNOSIS — D649 Anemia, unspecified: Secondary | ICD-10-CM | POA: Diagnosis not present

## 2020-06-06 DIAGNOSIS — D508 Other iron deficiency anemias: Secondary | ICD-10-CM

## 2020-06-06 DIAGNOSIS — E1122 Type 2 diabetes mellitus with diabetic chronic kidney disease: Secondary | ICD-10-CM | POA: Diagnosis not present

## 2020-06-06 DIAGNOSIS — D519 Vitamin B12 deficiency anemia, unspecified: Secondary | ICD-10-CM

## 2020-06-06 DIAGNOSIS — N184 Chronic kidney disease, stage 4 (severe): Secondary | ICD-10-CM | POA: Diagnosis not present

## 2020-06-06 DIAGNOSIS — D528 Other folate deficiency anemias: Secondary | ICD-10-CM

## 2020-06-06 DIAGNOSIS — I129 Hypertensive chronic kidney disease with stage 1 through stage 4 chronic kidney disease, or unspecified chronic kidney disease: Secondary | ICD-10-CM | POA: Diagnosis not present

## 2020-06-06 NOTE — Patient Instructions (Signed)
[facility] Discharge Instructions  You were seen today by Dr. Katragadda. He went over your recent results. Dr. Katragadda will see you back in 3 months for labs and follow up.   Thank you for choosing [facility] at [facility] to provide your oncology and hematology care.  To afford each patient quality time with our provider, please arrive at least 15 minutes before your scheduled appointment time.   If you have a lab appointment with the Cancer Center please come in thru the Main Entrance and check in at the main information desk  You need to re-schedule your appointment should you arrive 10 or more minutes late.  We strive to give you quality time with our providers, and arriving late affects you and other patients whose appointments are after yours.  Also, if you no show three or more times for appointments you may be dismissed from the clinic at the providers discretion.     Again, thank you for choosing [facility].  Our hope is that these requests will decrease the amount of time that you wait before being seen by our physicians.       _____________________________________________________________  Should you have questions after your visit to [facility], please contact our office at [phone] between the hours of 8:00 a.m. and 4:30 p.m.  Voicemails left after 4:00 p.m. will not be returned until the following business day.  For prescription refill requests, have your pharmacy contact our office and allow 72 hours.    Cancer Center Support Programs:   > Cancer Support Group  2nd Tuesday of the month 1pm-2pm, Journey Room   

## 2020-06-07 DIAGNOSIS — R2681 Unsteadiness on feet: Secondary | ICD-10-CM | POA: Diagnosis not present

## 2020-06-11 DIAGNOSIS — R2681 Unsteadiness on feet: Secondary | ICD-10-CM | POA: Diagnosis not present

## 2020-06-12 ENCOUNTER — Ambulatory Visit (INDEPENDENT_AMBULATORY_CARE_PROVIDER_SITE_OTHER): Payer: Medicare Other | Admitting: Internal Medicine

## 2020-06-12 ENCOUNTER — Encounter: Payer: Self-pay | Admitting: Internal Medicine

## 2020-06-12 VITALS — BP 144/58 | HR 52 | Temp 96.6°F | Ht 63.0 in | Wt 220.4 lb

## 2020-06-12 DIAGNOSIS — E164 Increased secretion of gastrin: Secondary | ICD-10-CM | POA: Diagnosis not present

## 2020-06-12 DIAGNOSIS — K269 Duodenal ulcer, unspecified as acute or chronic, without hemorrhage or perforation: Secondary | ICD-10-CM | POA: Diagnosis not present

## 2020-06-12 DIAGNOSIS — R1319 Other dysphagia: Secondary | ICD-10-CM

## 2020-06-12 DIAGNOSIS — K222 Esophageal obstruction: Secondary | ICD-10-CM | POA: Diagnosis not present

## 2020-06-12 NOTE — Patient Instructions (Signed)
We will schedule you for upper endoscopy with dilation to hopefully improve your swallowing.  I will also check to make sure that the multiple ulcers in your small bowel have healed at the same time.  Continue on Dexilant 60 mg daily.  This medication works best if you take it 30 minutes before breakfast.  At [facility] we value your feedback. You may receive a survey about your visit today. Please share your experience as we strive to create trusting relationships with our patients to provide genuine, compassionate, quality care.  We appreciate your understanding and patience as we review any laboratory studies, imaging, and other diagnostic tests that are ordered as we care for you. Our office policy is 5 business days for review of these results, and any emergent or urgent results are addressed in a timely manner for your best interest. If you do not hear from our office in 1 week, please contact us.   We also encourage the use of MyChart, which contains your medical information for your review as well. If you are not enrolled in this feature, an access code is on this after visit summary for your convenience. Thank you for allowing [place] to be involved in your care.  It was great to see you today!  I hope you have a great rest of your spring!!    Elon Alas. Abbey Chatters, D.O. Gastroenterology and Hepatology [facility]

## 2020-06-13 DIAGNOSIS — I25118 Atherosclerotic heart disease of native coronary artery with other forms of angina pectoris: Secondary | ICD-10-CM | POA: Diagnosis not present

## 2020-06-13 DIAGNOSIS — I428 Other cardiomyopathies: Secondary | ICD-10-CM | POA: Diagnosis not present

## 2020-06-13 DIAGNOSIS — I447 Left bundle-branch block, unspecified: Secondary | ICD-10-CM | POA: Diagnosis not present

## 2020-06-13 NOTE — H&P (View-Only) (Signed)
Referring Provider: Monico Blitz, MD Primary Care Physician:  Monico Blitz, MD Primary GI:  Dr. Abbey Chatters  Chief Complaint  Patient presents with  . Follow-up    Dysphagia, pt still has trouble swallowing pills and foods    HPI:   Christy [name] is a 70 y.o. female who presents to the clinic today for follow-up visit.  She has chronic reflux and dysphagia for which she takes Dexilant 60 mg daily.  Previously on omeprazole 20 mg twice daily but was switched after previous visit.  Last EGD 12/12/2019 showed H. pylori negative gastritis, multiple ulcerations in the duodenum.  Biopsies in the duodenum consistent with severely active nonspecific duodenitis.  Patient denies any NSAID use.  She also has chronic dysphagia for which I performed esophageal dilation with a 19 mm balloon during her EGD.  She states her symptoms do not improve from this.  She subsequently underwent esophagram which showed distal esophageal stricture.  She was unable to pass a 12 mm tablet.  Given her duodenal ulcerations, I did check a gastrin level which was elevated at 461.  Patient continues to complain of chronic dysphagia.  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  Past Medical History:  Diagnosis Date  . Bipolar disorder ([place])   . Coronary atherosclerosis of native coronary artery    BMS circ 2006, 70% PDA 2009  . Essential hypertension   . Mixed hyperlipidemia   . OSA on CPAP   . Renal tubular acidosis, type IV   . SVT (supraventricular tachycardia) ([place])   . Type 2 diabetes mellitus ([place])     Past Surgical History:  Procedure Laterality Date  . Acromioclavicular arthritis    . BALLOON DILATION N/A 12/12/2019   Procedure: BALLOON DILATION;  Surgeon: [name], DO;  [facility];  Service: Endoscopy;  Laterality: N/A;  . BIOPSY  12/12/2019   Procedure: BIOPSY;  Surgeon: [name], DO;  [facility];  Service: Endoscopy;;  . CARDIAC CATHETERIZATION  N/A 03/29/2015   Procedure: Left Heart Cath and Coronary Angiography;  Surgeon: Troy Sine, MD;  Location: [facility];  Service: Cardiovascular;  Laterality: N/A;  . COLONOSCOPY WITH PROPOFOL N/A 12/12/2019   Procedure: COLONOSCOPY WITH PROPOFOL;  Surgeon: [name], DO;  [facility];  Service: Endoscopy;  Laterality: N/A;  2:30pm  . ESOPHAGOGASTRODUODENOSCOPY (EGD) WITH PROPOFOL N/A 12/12/2019   Procedure: ESOPHAGOGASTRODUODENOSCOPY (EGD) WITH PROPOFOL;  Surgeon: [name], DO;  [facility];  Service: Endoscopy;  Laterality: N/A;  . POLYPECTOMY  12/12/2019   Procedure: POLYPECTOMY;  Surgeon: [name], DO;  [facility];  Service: Endoscopy;;  . RIght shoulder adhesive capsulitis    . Rotator cuff impingement syndrome    . Rotator cuff tear      Current Outpatient Medications  Medication Sig Dispense Refill  . allopurinol (ZYLOPRIM) 300 MG tablet Take 150 mg by mouth daily.    Marland Kitchen amLODipine (NORVASC) 5 MG tablet Take 5 mg by mouth at bedtime.     Marland Kitchen aspirin 81 MG EC tablet Take 81 mg by mouth at bedtime.     Marland Kitchen BESIVANCE 0.6 % SUSP Place 1 drop into the left eye See admin instructions. Instill one drop into the left eye four times daily for 2 days after each monthly injection  12  . buPROPion (WELLBUTRIN XL) 300 MG 24 hr tablet Take 300 mg by mouth daily.    Marland Kitchen  calcium carbonate (OS-CAL) 600 MG TABS Take 600 mg by mouth daily.      . carvedilol (COREG) 12.5 MG tablet Take 12.5 mg by mouth 2 (two) times daily with a meal.    . Cholecalciferol (VITAMIN D3) 25 MCG (1000 UT) CAPS Take 1,000 Units by mouth daily.     . clotrimazole (LOTRIMIN) 1 % cream Apply 1 application topically daily as needed (itching).    Marland Kitchen dexlansoprazole (DEXILANT) 60 MG capsule Take 1 capsule (60 mg total) by mouth daily. 30 capsule 5  . divalproex (DEPAKOTE) 250 MG EC tablet Take 250 mg by mouth in the morning.    Marland Kitchen GLOBAL EASE INJECT PEN NEEDLES 32G X  4 MM MISC See admin instructions.    . hydrALAZINE (APRESOLINE) 50 MG tablet Take 50 mg by mouth 3 (three) times daily.    . isosorbide mononitrate (IMDUR) 60 MG 24 hr tablet Take by mouth.    . losartan (COZAAR) 25 MG tablet Take 25 mg by mouth daily.    . meclizine (ANTIVERT) 25 MG tablet Take 25 mg by mouth as needed for dizziness.    . metolazone (ZAROXOLYN) 5 MG tablet Take 5 mg by mouth daily as needed (shortness of breath).     . Multiple Vitamin (MULTIVITAMIN) tablet Take 1 tablet by mouth daily.      . nitroGLYCERIN (NITROLINGUAL) 0.4 MG/SPRAY spray Place 1 spray under the tongue every 5 (five) minutes x 3 doses as needed. (Patient taking differently: Place 1 spray under the tongue as needed.) 12 g 2  . NOVOLOG FLEXPEN 100 UNIT/ML FlexPen Inject 5-12 Units into the skin 3 (three) times daily as needed for high blood sugar.     . potassium chloride (KLOR-CON) 10 MEQ tablet Take 10 mEq by mouth daily as needed (when taking metolazone).    . rosuvastatin (CRESTOR) 10 MG tablet Take 10 mg by mouth daily.    . sertraline (ZOLOFT) 100 MG tablet Take 100 mg by mouth daily.    Marland Kitchen tiZANidine (ZANAFLEX) 4 MG tablet Take 4 mg by mouth every evening.    . torsemide (DEMADEX) 20 MG tablet Take 60 mg by mouth in the morning.     . traZODone (DESYREL) 100 MG tablet Take 100 mg by mouth at bedtime.   3  . TRESIBA FLEXTOUCH 200 UNIT/ML SOPN Inject 32 Units into the skin in the morning.  2  . VICTOZA 18 MG/3ML SOPN Inject 1.2 mg into the skin every evening.      No current facility-administered medications for this visit.    Allergies as of 06/12/2020 - Review Complete 06/12/2020  Allergen Reaction Noted  . Ace inhibitors Cough 08/27/2010  . Lisinopril Cough 09/25/2015  . Penicillins  12/14/2007  . Tetracycline Nausea Only 12/14/2007  . Lamictal [lamotrigine] Rash 08/27/2010    Family History  Problem Relation Age of Onset  . Bipolar disorder Brother   . Diabetes Father     Social History    Socioeconomic History  . Marital status: Divorced    Spouse name: Not on file  . Number of children: Not on file  . Years of education: Not on file  . Highest education level: Not on file  Occupational History  . Occupation: PLANNING AND INSPECTOR    Employer: [employer]  Tobacco Use  . Smoking status: Former Smoker    Packs/day: 1.00    Years: 20.00    Pack years: 20.00    Types: Cigarettes  Start date: 10/30/1984    Quit date: 07/08/2013    Years since quitting: 6.9  . Smokeless tobacco: Never Used  Substance and Sexual Activity  . Alcohol use: No    Alcohol/week: 0.0 standard drinks  . Drug use: No  . Sexual activity: Not on file  Other Topics Concern  . Not on file  Social History Narrative   She has lived with some of her daughters. Works part-time for the city.    Social Determinants of Health   Financial Resource Strain: Not on file  Food Insecurity: Not on file  Transportation Needs: Not on file  Physical Activity: Not on file  Stress: Not on file  Social Connections: Not on file    Subjective: Review of Systems  Constitutional: Negative for chills and fever.  HENT: Negative for congestion and hearing loss.   Eyes: Negative for blurred vision and double vision.  Respiratory: Negative for cough and shortness of breath.   Cardiovascular: Negative for chest pain and palpitations.  Gastrointestinal: Negative for abdominal pain, blood in stool, constipation, diarrhea, heartburn, melena and vomiting.       Dysphagia  Genitourinary: Negative for dysuria and urgency.  Musculoskeletal: Negative for joint pain and myalgias.  Skin: Negative for itching and rash.  Neurological: Negative for dizziness and headaches.  Psychiatric/Behavioral: Negative for depression. The patient is not nervous/anxious.      Objective: BP (!) 144/58   Pulse (!) 52   Temp (!) 96.6 F (35.9 C) (Temporal)   Ht 5\' 3"  (1.6 m)   Wt 220 lb 6.4 oz (100 kg)   BMI 39.04 kg/m   Physical Exam Constitutional:      Appearance: Normal appearance. She is obese.  HENT:     Head: Normocephalic and atraumatic.  Eyes:     Extraocular Movements: Extraocular movements intact.     Conjunctiva/sclera: Conjunctivae normal.  Cardiovascular:     Rate and Rhythm: Normal rate and regular rhythm.  Pulmonary:     Effort: Pulmonary effort is normal.     Breath sounds: Normal breath sounds.  Abdominal:     General: Bowel sounds are normal.     Palpations: Abdomen is soft.  Musculoskeletal:        General: No swelling. Normal range of motion.     Cervical back: Normal range of motion and neck supple.  Skin:    General: Skin is warm and dry.     Coloration: Skin is not jaundiced.  Neurological:     General: No focal deficit present.     Mental Status: She is alert and oriented to person, place, and time.  Psychiatric:        Mood and Affect: Mood normal.        Behavior: Behavior normal.      Assessment: *Dysphagia-chronic *Chronic reflux-well-controlled on Dexilant *Elevated gastrin level *Duodenal ulcerations  Plan: In regards to patient's chronic dysphagia, likely component of age-related dysmotility as seen on her esophagram.  The patient says she did have evidence of a distal esophageal stricture with the 12 mm tablet unable to pass through.  Discussed that we could potentially repeat esophageal dilation and patient would like to try.  At the same time we will evaluate previously noted duodenal ulcerations.  Hopefully these of healed on Dexilant.  Her elevated gastrin level is not greater than 1000 though it is significantly elevated at 461.  She is chronically on PPI therapy which could attribute for this elevation.  Depending on EGD findings,  we may need to repeat fasting gastrin or perform further studies with secretin stimulation test.  Patient follow-up after her EGD.  06/13/2020 10:04 AM   Disclaimer: This note was dictated with voice recognition  software. Similar sounding words can inadvertently be transcribed and may not be corrected upon review.

## 2020-06-13 NOTE — Progress Notes (Signed)
Referring Provider: Monico Blitz, MD Primary Care Physician:  Monico Blitz, MD Primary GI:  Dr. Abbey Chatters  Chief Complaint  Patient presents with  . Follow-up    Dysphagia, pt still has trouble swallowing pills and foods    HPI:   Christy [name] is a 70 y.o. female who presents to the clinic today for follow-up visit.  She has chronic reflux and dysphagia for which she takes Dexilant 60 mg daily.  Previously on omeprazole 20 mg twice daily but was switched after previous visit.  Last EGD 12/12/2019 showed H. pylori negative gastritis, multiple ulcerations in the duodenum.  Biopsies in the duodenum consistent with severely active nonspecific duodenitis.  Patient denies any NSAID use.  She also has chronic dysphagia for which I performed esophageal dilation with a 19 mm balloon during her EGD.  She states her symptoms do not improve from this.  She subsequently underwent esophagram which showed distal esophageal stricture.  She was unable to pass a 12 mm tablet.  Given her duodenal ulcerations, I did check a gastrin level which was elevated at 461.  Patient continues to complain of chronic dysphagia.  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  Past Medical History:  Diagnosis Date  . Bipolar disorder ([place])   . Coronary atherosclerosis of native coronary artery    BMS circ 2006, 70% PDA 2009  . Essential hypertension   . Mixed hyperlipidemia   . OSA on CPAP   . Renal tubular acidosis, type IV   . SVT (supraventricular tachycardia) ([place])   . Type 2 diabetes mellitus ([place])     Past Surgical History:  Procedure Laterality Date  . Acromioclavicular arthritis    . BALLOON DILATION N/A 12/12/2019   Procedure: BALLOON DILATION;  Surgeon: [name], DO;  [facility];  Service: Endoscopy;  Laterality: N/A;  . BIOPSY  12/12/2019   Procedure: BIOPSY;  Surgeon: [name], DO;  [facility];  Service: Endoscopy;;  . CARDIAC CATHETERIZATION  N/A 03/29/2015   Procedure: Left Heart Cath and Coronary Angiography;  Surgeon: Troy Sine, MD;  Location: [facility];  Service: Cardiovascular;  Laterality: N/A;  . COLONOSCOPY WITH PROPOFOL N/A 12/12/2019   Procedure: COLONOSCOPY WITH PROPOFOL;  Surgeon: [name], DO;  [facility];  Service: Endoscopy;  Laterality: N/A;  2:30pm  . ESOPHAGOGASTRODUODENOSCOPY (EGD) WITH PROPOFOL N/A 12/12/2019   Procedure: ESOPHAGOGASTRODUODENOSCOPY (EGD) WITH PROPOFOL;  Surgeon: [name], DO;  [facility];  Service: Endoscopy;  Laterality: N/A;  . POLYPECTOMY  12/12/2019   Procedure: POLYPECTOMY;  Surgeon: [name], DO;  [facility];  Service: Endoscopy;;  . RIght shoulder adhesive capsulitis    . Rotator cuff impingement syndrome    . Rotator cuff tear      Current Outpatient Medications  Medication Sig Dispense Refill  . allopurinol (ZYLOPRIM) 300 MG tablet Take 150 mg by mouth daily.    Marland Kitchen amLODipine (NORVASC) 5 MG tablet Take 5 mg by mouth at bedtime.     Marland Kitchen aspirin 81 MG EC tablet Take 81 mg by mouth at bedtime.     Marland Kitchen BESIVANCE 0.6 % SUSP Place 1 drop into the left eye See admin instructions. Instill one drop into the left eye four times daily for 2 days after each monthly injection  12  . buPROPion (WELLBUTRIN XL) 300 MG 24 hr tablet Take 300 mg by mouth daily.    Marland Kitchen  calcium carbonate (OS-CAL) 600 MG TABS Take 600 mg by mouth daily.      . carvedilol (COREG) 12.5 MG tablet Take 12.5 mg by mouth 2 (two) times daily with a meal.    . Cholecalciferol (VITAMIN D3) 25 MCG (1000 UT) CAPS Take 1,000 Units by mouth daily.     . clotrimazole (LOTRIMIN) 1 % cream Apply 1 application topically daily as needed (itching).    Marland Kitchen dexlansoprazole (DEXILANT) 60 MG capsule Take 1 capsule (60 mg total) by mouth daily. 30 capsule 5  . divalproex (DEPAKOTE) 250 MG EC tablet Take 250 mg by mouth in the morning.    Marland Kitchen GLOBAL EASE INJECT PEN NEEDLES 32G X  4 MM MISC See admin instructions.    . hydrALAZINE (APRESOLINE) 50 MG tablet Take 50 mg by mouth 3 (three) times daily.    . isosorbide mononitrate (IMDUR) 60 MG 24 hr tablet Take by mouth.    . losartan (COZAAR) 25 MG tablet Take 25 mg by mouth daily.    . meclizine (ANTIVERT) 25 MG tablet Take 25 mg by mouth as needed for dizziness.    . metolazone (ZAROXOLYN) 5 MG tablet Take 5 mg by mouth daily as needed (shortness of breath).     . Multiple Vitamin (MULTIVITAMIN) tablet Take 1 tablet by mouth daily.      . nitroGLYCERIN (NITROLINGUAL) 0.4 MG/SPRAY spray Place 1 spray under the tongue every 5 (five) minutes x 3 doses as needed. (Patient taking differently: Place 1 spray under the tongue as needed.) 12 g 2  . NOVOLOG FLEXPEN 100 UNIT/ML FlexPen Inject 5-12 Units into the skin 3 (three) times daily as needed for high blood sugar.     . potassium chloride (KLOR-CON) 10 MEQ tablet Take 10 mEq by mouth daily as needed (when taking metolazone).    . rosuvastatin (CRESTOR) 10 MG tablet Take 10 mg by mouth daily.    . sertraline (ZOLOFT) 100 MG tablet Take 100 mg by mouth daily.    Marland Kitchen tiZANidine (ZANAFLEX) 4 MG tablet Take 4 mg by mouth every evening.    . torsemide (DEMADEX) 20 MG tablet Take 60 mg by mouth in the morning.     . traZODone (DESYREL) 100 MG tablet Take 100 mg by mouth at bedtime.   3  . TRESIBA FLEXTOUCH 200 UNIT/ML SOPN Inject 32 Units into the skin in the morning.  2  . VICTOZA 18 MG/3ML SOPN Inject 1.2 mg into the skin every evening.      No current facility-administered medications for this visit.    Allergies as of 06/12/2020 - Review Complete 06/12/2020  Allergen Reaction Noted  . Ace inhibitors Cough 08/27/2010  . Lisinopril Cough 09/25/2015  . Penicillins  12/14/2007  . Tetracycline Nausea Only 12/14/2007  . Lamictal [lamotrigine] Rash 08/27/2010    Family History  Problem Relation Age of Onset  . Bipolar disorder Brother   . Diabetes Father     Social History    Socioeconomic History  . Marital status: Divorced    Spouse name: Not on file  . Number of children: Not on file  . Years of education: Not on file  . Highest education level: Not on file  Occupational History  . Occupation: PLANNING AND INSPECTOR    Employer: [employer]  Tobacco Use  . Smoking status: Former Smoker    Packs/day: 1.00    Years: 20.00    Pack years: 20.00    Types: Cigarettes  Start date: 10/30/1984    Quit date: 07/08/2013    Years since quitting: 6.9  . Smokeless tobacco: Never Used  Substance and Sexual Activity  . Alcohol use: No    Alcohol/week: 0.0 standard drinks  . Drug use: No  . Sexual activity: Not on file  Other Topics Concern  . Not on file  Social History Narrative   She has lived with some of her daughters. Works part-time for the city.    Social Determinants of Health   Financial Resource Strain: Not on file  Food Insecurity: Not on file  Transportation Needs: Not on file  Physical Activity: Not on file  Stress: Not on file  Social Connections: Not on file    Subjective: Review of Systems  Constitutional: Negative for chills and fever.  HENT: Negative for congestion and hearing loss.   Eyes: Negative for blurred vision and double vision.  Respiratory: Negative for cough and shortness of breath.   Cardiovascular: Negative for chest pain and palpitations.  Gastrointestinal: Negative for abdominal pain, blood in stool, constipation, diarrhea, heartburn, melena and vomiting.       Dysphagia  Genitourinary: Negative for dysuria and urgency.  Musculoskeletal: Negative for joint pain and myalgias.  Skin: Negative for itching and rash.  Neurological: Negative for dizziness and headaches.  Psychiatric/Behavioral: Negative for depression. The patient is not nervous/anxious.      Objective: BP (!) 144/58   Pulse (!) 52   Temp (!) 96.6 F (35.9 C) (Temporal)   Ht 5\' 3"  (1.6 m)   Wt 220 lb 6.4 oz (100 kg)   BMI 39.04 kg/m   Physical Exam Constitutional:      Appearance: Normal appearance. She is obese.  HENT:     Head: Normocephalic and atraumatic.  Eyes:     Extraocular Movements: Extraocular movements intact.     Conjunctiva/sclera: Conjunctivae normal.  Cardiovascular:     Rate and Rhythm: Normal rate and regular rhythm.  Pulmonary:     Effort: Pulmonary effort is normal.     Breath sounds: Normal breath sounds.  Abdominal:     General: Bowel sounds are normal.     Palpations: Abdomen is soft.  Musculoskeletal:        General: No swelling. Normal range of motion.     Cervical back: Normal range of motion and neck supple.  Skin:    General: Skin is warm and dry.     Coloration: Skin is not jaundiced.  Neurological:     General: No focal deficit present.     Mental Status: She is alert and oriented to person, place, and time.  Psychiatric:        Mood and Affect: Mood normal.        Behavior: Behavior normal.      Assessment: *Dysphagia-chronic *Chronic reflux-well-controlled on Dexilant *Elevated gastrin level *Duodenal ulcerations  Plan: In regards to patient's chronic dysphagia, likely component of age-related dysmotility as seen on her esophagram.  The patient says she did have evidence of a distal esophageal stricture with the 12 mm tablet unable to pass through.  Discussed that we could potentially repeat esophageal dilation and patient would like to try.  At the same time we will evaluate previously noted duodenal ulcerations.  Hopefully these of healed on Dexilant.  Her elevated gastrin level is not greater than 1000 though it is significantly elevated at 461.  She is chronically on PPI therapy which could attribute for this elevation.  Depending on EGD findings,  we may need to repeat fasting gastrin or perform further studies with secretin stimulation test.  Patient follow-up after her EGD.  06/13/2020 10:04 AM   Disclaimer: This note was dictated with voice recognition  software. Similar sounding words can inadvertently be transcribed and may not be corrected upon review.

## 2020-06-14 DIAGNOSIS — R2681 Unsteadiness on feet: Secondary | ICD-10-CM | POA: Diagnosis not present

## 2020-06-18 DIAGNOSIS — R2681 Unsteadiness on feet: Secondary | ICD-10-CM | POA: Diagnosis not present

## 2020-06-18 DIAGNOSIS — I10 Essential (primary) hypertension: Secondary | ICD-10-CM | POA: Diagnosis not present

## 2020-06-18 NOTE — Patient Instructions (Addendum)
Christy [name]  06/18/2020     @PREFPERIOPPHARMACY @   Your procedure is scheduled on  06/25/2020.   Report to [facility] at  1215  P.M.   Call this number if you have problems the morning of surgery:  [phone]   Remember:  Follow the diet instructions given to you by the office.                       Take these medicines the morning of surgery with A SIP OF WATER  Allopurinol, amlodipine, wellbutrin, carvedilol, dexilant, depakote, isosorbide, antivert (if needed), zoloft.  Take 1/2 of your night time Novolog dose the night before your procedure and DO NOT take any medication for diabetes the morning of your procedure.    Please brush your teeth.  Do not wear jewelry, make-up or nail polish.  Do not wear lotions, powders, or perfumes, or deodorant.  Do not shave 48 hours prior to surgery.  Men may shave face and neck.  Do not bring valuables to the hospital.  [facility] is not responsible for any belongings or valuables.  Contacts, dentures or bridgework may not be worn into surgery.  Leave your suitcase in the car.  After surgery it may be brought to your room.  For patients admitted to the hospital, discharge time will be determined by your treatment team.  Patients discharged the day of surgery will not be allowed to drive home and must have someone with them for 24 hours.    Special instructions:  DO NOT smoke tobacco or vape for 24 hours before your procedure.   Please read over the following fact sheets that you were given. Anesthesia Post-op Instructions and Care and Recovery After Surgery       Upper Endoscopy, Adult, Care After This sheet gives you information about how to care for yourself after your procedure. Your health care provider may also give you more specific instructions. If you have problems or questions, contact your health care provider. What can I expect after the procedure? After the procedure, it is common to have:  A sore  throat.  Mild stomach pain or discomfort.  Bloating.  Nausea. Follow these instructions at home:  Follow instructions from your health care provider about what to eat or drink after your procedure.  Return to your normal activities as told by your health care provider. Ask your health care provider what activities are safe for you.  Take over-the-counter and prescription medicines only as told by your health care provider.  If you were given a sedative during the procedure, it can affect you for several hours. Do not drive or operate machinery until your health care provider says that it is safe.  Keep all follow-up visits as told by your health care provider. This is important.   Contact a health care provider if you have:  A sore throat that lasts longer than one day.  Trouble swallowing. Get help right away if:  You vomit blood or your vomit looks like coffee grounds.  You have: ? A fever. ? Bloody, black, or tarry stools. ? A severe sore throat or you cannot swallow. ? Difficulty breathing. ? Severe pain in your chest or abdomen. Summary  After the procedure, it is common to have a sore throat, mild stomach discomfort, bloating, and nausea.  If you were given a sedative during the procedure, it can affect you for several hours.  Do not drive or operate machinery until your health care provider says that it is safe.  Follow instructions from your health care provider about what to eat or drink after your procedure.  Return to your normal activities as told by your health care provider. This information is not intended to replace advice given to you by your health care provider. Make sure you discuss any questions you have with your health care provider. Document Revised: 01/03/2019 Document Reviewed: 06/07/2017 Elsevier Patient Education  2021 [employer].  https://www.asge.org/home/for-patients/patient-information/understanding-eso-dilation-updated">  Esophageal  Dilatation Esophageal dilatation, also called esophageal dilation, is a procedure to widen or open a blocked or narrowed part of the esophagus. The esophagus is the part of the body that moves food and liquid from the mouth to the stomach. You may need this procedure if:  You have a buildup of scar tissue in your esophagus that makes it difficult, painful, or impossible to swallow. This can be caused by gastroesophageal reflux disease (GERD).  You have cancer of the esophagus.  There is a problem with how food moves through your esophagus. In some cases, you may need this procedure repeated at a later time to dilate the esophagus gradually. Tell a health care provider about:  Any allergies you have.  All medicines you are taking, including vitamins, herbs, eye drops, creams, and over-the-counter medicines.  Any problems you or family members have had with anesthetic medicines.  Any blood disorders you have.  Any surgeries you have had.  Any medical conditions you have.  Any antibiotic medicines you are required to take before dental procedures.  Whether you are pregnant or may be pregnant. What are the risks? Generally, this is a safe procedure. However, problems may occur, including:  Bleeding due to a tear in the lining of the esophagus.  A hole, or perforation, in the esophagus. What happens before the procedure?  Ask your health care provider about: ? Changing or stopping your regular medicines. This is especially important if you are taking diabetes medicines or blood thinners. ? Taking medicines such as aspirin and ibuprofen. These medicines can thin your blood. Do not take these medicines unless your health care provider tells you to take them. ? Taking over-the-counter medicines, vitamins, herbs, and supplements.  Follow instructions from your health care provider about eating or drinking restrictions.  Plan to have a responsible adult take you home from the hospital  or clinic.  Plan to have a responsible adult care for you for the time you are told after you leave the hospital or clinic. This is important. What happens during the procedure?  You may be given a medicine to help you relax (sedative).  A numbing medicine may be sprayed into the back of your throat, or you may gargle the medicine.  Your health care provider may perform the dilatation using various surgical instruments, such as: ? Simple dilators. This instrument is carefully placed in the esophagus to stretch it. ? Guided wire bougies. This involves using an endoscope to insert a wire into the esophagus. A dilator is passed over this wire to enlarge the esophagus. Then the wire is removed. ? Balloon dilators. An endoscope with a small balloon is inserted into the esophagus. The balloon is inflated to stretch the esophagus and open it up. The procedure may vary among health care providers and hospitals. What can I expect after the procedure?  Your blood pressure, heart rate, breathing rate, and blood oxygen level will be monitored until you leave  the hospital or clinic.  Your throat may feel slightly sore and numb. This will get better over time.  You will not be allowed to eat or drink until your throat is no longer numb.  When you are able to drink, urinate, and sit on the edge of the bed without nausea or dizziness, you may be able to return home. Follow these instructions at home:  Take over-the-counter and prescription medicines only as told by your health care provider.  If you were given a sedative during the procedure, it can affect you for several hours. Do not drive or operate machinery until your health care provider says that it is safe.  Plan to have a responsible adult care for you for the time you are told. This is important.  Follow instructions from your health care provider about any eating or drinking restrictions.  Do not use any products that contain nicotine or  tobacco, such as cigarettes, e-cigarettes, and chewing tobacco. If you need help quitting, ask your health care provider.  Keep all follow-up visits. This is important. Contact a health care provider if:  You have a fever.  You have pain that is not relieved by medicine. Get help right away if:  You have chest pain.  You have trouble breathing.  You have trouble swallowing.  You vomit blood.  You have black, tarry, or bloody stools. These symptoms may represent a serious problem that is an emergency. Do not wait to see if the symptoms will go away. Get medical help right away. Call your local emergency services (911 in the [place] Do not drive yourself to the hospital. Summary  Esophageal dilatation, also called esophageal dilation, is a procedure to widen or open a blocked or narrowed part of the esophagus.  Plan to have a responsible adult take you home from the hospital or clinic.  For this procedure, a numbing medicine may be sprayed into the back of your throat, or you may gargle the medicine.  Do not drive or operate machinery until your health care provider says that it is safe. This information is not intended to replace advice given to you by your health care provider. Make sure you discuss any questions you have with your health care provider. Document Revised: 05/24/2019 Document Reviewed: 05/24/2019 Elsevier Patient Education  2021 [name] After This sheet gives you information about how to care for yourself after your procedure. Your health care provider may also give you more specific instructions. If you have problems or questions, contact your health care provider. What can I expect after the procedure? After the procedure, it is common to have:  Tiredness.  Forgetfulness about what happened after the procedure.  Impaired judgment for important decisions.  Nausea or vomiting.  Some difficulty  with balance. Follow these instructions at home: For the time period you were told by your health care provider:  Rest as needed.  Do not participate in activities where you could fall or become injured.  Do not drive or use machinery.  Do not drink alcohol.  Do not take sleeping pills or medicines that cause drowsiness.  Do not make important decisions or sign legal documents.  Do not take care of children on your own.      Eating and drinking  Follow the diet that is recommended by your health care provider.  Drink enough fluid to keep your urine pale  yellow.  If you vomit: ? Drink water, juice, or soup when you can drink without vomiting. ? Make sure you have little or no nausea before eating solid foods. General instructions  Have a responsible adult stay with you for the time you are told. It is important to have someone help care for you until you are awake and alert.  Take over-the-counter and prescription medicines only as told by your health care provider.  If you have sleep apnea, surgery and certain medicines can increase your risk for breathing problems. Follow instructions from your health care provider about wearing your sleep device: ? Anytime you are sleeping, including during daytime naps. ? While taking prescription pain medicines, sleeping medicines, or medicines that make you drowsy.  Avoid smoking.  Keep all follow-up visits as told by your health care provider. This is important. Contact a health care provider if:  You keep feeling nauseous or you keep vomiting.  You feel light-headed.  You are still sleepy or having trouble with balance after 24 hours.  You develop a rash.  You have a fever.  You have redness or swelling around the IV site. Get help right away if:  You have trouble breathing.  You have new-onset confusion at home. Summary  For several hours after your procedure, you may feel tired. You may also be forgetful and have  poor judgment.  Have a responsible adult stay with you for the time you are told. It is important to have someone help care for you until you are awake and alert.  Rest as told. Do not drive or operate machinery. Do not drink alcohol or take sleeping pills.  Get help right away if you have trouble breathing, or if you suddenly become confused. This information is not intended to replace advice given to you by your health care provider. Make sure you discuss any questions you have with your health care provider. Document Revised: 09/21/2019 Document Reviewed: 12/08/2018 Elsevier Patient Education  2021 [employer].

## 2020-06-19 ENCOUNTER — Encounter (HOSPITAL_COMMUNITY)
Admission: RE | Admit: 2020-06-19 | Discharge: 2020-06-19 | Disposition: A | Discharge status: Home / Self Care | Payer: Medicare Other | Source: Ambulatory Visit | Attending: Internal Medicine | Admitting: Internal Medicine

## 2020-06-19 ENCOUNTER — Other Ambulatory Visit: Payer: Self-pay

## 2020-06-19 ENCOUNTER — Encounter (HOSPITAL_COMMUNITY): Payer: Self-pay

## 2020-06-19 DIAGNOSIS — E1129 Type 2 diabetes mellitus with other diabetic kidney complication: Secondary | ICD-10-CM | POA: Diagnosis not present

## 2020-06-19 DIAGNOSIS — Z01818 Encounter for other preprocedural examination: Secondary | ICD-10-CM | POA: Diagnosis not present

## 2020-06-19 DIAGNOSIS — R809 Proteinuria, unspecified: Secondary | ICD-10-CM | POA: Diagnosis not present

## 2020-06-19 DIAGNOSIS — E1122 Type 2 diabetes mellitus with diabetic chronic kidney disease: Secondary | ICD-10-CM | POA: Diagnosis not present

## 2020-06-19 DIAGNOSIS — N181 Chronic kidney disease, stage 1: Secondary | ICD-10-CM | POA: Diagnosis not present

## 2020-06-19 HISTORY — DX: Heart failure, unspecified: I50.9

## 2020-06-19 HISTORY — DX: Unspecified osteoarthritis, unspecified site: M19.90

## 2020-06-19 LAB — BASIC METABOLIC PANEL
Anion gap: 8 (ref 5–15)
BUN: 52 mg/dL — ABNORMAL HIGH (ref 8–23)
CO2: 27 mmol/L (ref 22–32)
Calcium: 8.2 mg/dL — ABNORMAL LOW (ref 8.9–10.3)
Chloride: 104 mmol/L (ref 98–111)
Creatinine, Ser: 1.58 mg/dL — ABNORMAL HIGH (ref 0.44–1.00)
GFR, Estimated: 35 mL/min — ABNORMAL LOW (ref >60)
Glucose, Bld: 98 mg/dL (ref 70–99)
Potassium: 3.9 mmol/L (ref 3.5–5.1)
Sodium: 139 mmol/L (ref 135–145)

## 2020-06-21 DIAGNOSIS — R2681 Unsteadiness on feet: Secondary | ICD-10-CM | POA: Diagnosis not present

## 2020-06-25 ENCOUNTER — Ambulatory Visit (HOSPITAL_COMMUNITY): Payer: Medicare Other | Admitting: Anesthesiology

## 2020-06-25 ENCOUNTER — Ambulatory Visit (HOSPITAL_COMMUNITY)
Admission: RE | Admit: 2020-06-25 | Discharge: 2020-06-25 | Disposition: A | Discharge status: Home / Self Care | Payer: Medicare Other | Attending: Internal Medicine | Admitting: Internal Medicine

## 2020-06-25 ENCOUNTER — Encounter (HOSPITAL_COMMUNITY)
Admission: RE | Disposition: A | Discharge status: Home / Self Care | Payer: Self-pay | Source: Home / Self Care | Attending: Internal Medicine

## 2020-06-25 ENCOUNTER — Encounter (HOSPITAL_COMMUNITY): Payer: Self-pay

## 2020-06-25 DIAGNOSIS — Z7982 Long term (current) use of aspirin: Secondary | ICD-10-CM | POA: Diagnosis not present

## 2020-06-25 DIAGNOSIS — Z888 Allergy status to other drugs, medicaments and biological substances status: Secondary | ICD-10-CM | POA: Insufficient documentation

## 2020-06-25 DIAGNOSIS — R1310 Dysphagia, unspecified: Secondary | ICD-10-CM

## 2020-06-25 DIAGNOSIS — Z955 Presence of coronary angioplasty implant and graft: Secondary | ICD-10-CM | POA: Insufficient documentation

## 2020-06-25 DIAGNOSIS — K222 Esophageal obstruction: Secondary | ICD-10-CM | POA: Diagnosis not present

## 2020-06-25 DIAGNOSIS — K2970 Gastritis, unspecified, without bleeding: Secondary | ICD-10-CM | POA: Insufficient documentation

## 2020-06-25 DIAGNOSIS — K219 Gastro-esophageal reflux disease without esophagitis: Secondary | ICD-10-CM | POA: Diagnosis not present

## 2020-06-25 DIAGNOSIS — I471 Supraventricular tachycardia: Secondary | ICD-10-CM | POA: Diagnosis not present

## 2020-06-25 DIAGNOSIS — Z87891 Personal history of nicotine dependence: Secondary | ICD-10-CM | POA: Diagnosis not present

## 2020-06-25 DIAGNOSIS — Z79899 Other long term (current) drug therapy: Secondary | ICD-10-CM | POA: Diagnosis not present

## 2020-06-25 DIAGNOSIS — Z880 Allergy status to penicillin: Secondary | ICD-10-CM | POA: Diagnosis not present

## 2020-06-25 DIAGNOSIS — Z794 Long term (current) use of insulin: Secondary | ICD-10-CM | POA: Diagnosis not present

## 2020-06-25 DIAGNOSIS — Z881 Allergy status to other antibiotic agents status: Secondary | ICD-10-CM | POA: Diagnosis not present

## 2020-06-25 HISTORY — PX: BALLOON DILATION: SHX5330

## 2020-06-25 HISTORY — PX: ESOPHAGOGASTRODUODENOSCOPY (EGD) WITH PROPOFOL: SHX5813

## 2020-06-25 LAB — GLUCOSE, CAPILLARY
Glucose-Capillary: 124 mg/dL — ABNORMAL HIGH (ref 70–99)
Glucose-Capillary: 124 mg/dL — ABNORMAL HIGH (ref 70–99)

## 2020-06-25 SURGERY — ESOPHAGOGASTRODUODENOSCOPY (EGD) WITH PROPOFOL
Anesthesia: General

## 2020-06-25 MED ORDER — LIDOCAINE HCL (CARDIAC) PF 100 MG/5ML IV SOSY
PREFILLED_SYRINGE | INTRAVENOUS | Status: DC | PRN
  Administered 2020-06-25: 50 mg via INTRAVENOUS

## 2020-06-25 MED ORDER — LACTATED RINGERS IV SOLN: INTRAVENOUS | Status: DC

## 2020-06-25 MED ORDER — PROPOFOL 10 MG/ML IV BOLUS
INTRAVENOUS | Status: DC | PRN
  Administered 2020-06-25: 80 mg via INTRAVENOUS
  Administered 2020-06-25: 40 mg via INTRAVENOUS

## 2020-06-25 MED ORDER — STERILE WATER FOR IRRIGATION IR SOLN
Status: DC | PRN
  Administered 2020-06-25: 1.5 mL

## 2020-06-25 NOTE — Anesthesia Postprocedure Evaluation (Signed)
Anesthesia Post Note  Patient: DAWNIELLE CHRISTIANA  Procedure(s) Performed: ESOPHAGOGASTRODUODENOSCOPY (EGD) WITH PROPOFOL (N/A ) BALLOON DILATION (N/A )  Patient location during evaluation: Phase II Anesthesia Type: General Level of consciousness: awake and alert and oriented Pain management: pain level controlled Vital Signs Assessment: post-procedure vital signs reviewed and stable Respiratory status: spontaneous breathing and respiratory function stable Cardiovascular status: blood pressure returned to baseline and stable Postop Assessment: no apparent nausea or vomiting Anesthetic complications: no   No complications documented.   Last Vitals:  Vitals:   06/25/20 1249 06/25/20 1346  BP: (!) 188/66 (!) 166/53  Pulse: (!) 55 65  Resp: 18 16  Temp: 36.7 C 36.6 C  SpO2: 96% 100%    Last Pain:  Vitals:   06/25/20 1346  TempSrc: Oral  PainSc: 0-No pain                 Tiandre Teall C Lian Tanori

## 2020-06-25 NOTE — Op Note (Signed)
[facility] Patient Name: Christy [name] Procedure Date: 06/25/2020 1:17 PM MRN: [id number] Date of Birth: Nov 18, 1950 Attending MD: Elon Alas. Abbey Chatters DO CSN: [id number] Age: 70 Admit Type: Outpatient Procedure:                Upper GI endoscopy Indications:              Dysphagia Providers:                Elon Alas. Abbey Chatters, DO, Caprice Kluver, [place]                            Risa Grill, Technician Referring MD:              Medicines:                See the Anesthesia note for documentation of the                            administered medications Complications:            No immediate complications. Estimated Blood Loss:     Estimated blood loss was minimal. Procedure:                Pre-Anesthesia Assessment:                           - The anesthesia plan was to use monitored                            anesthesia care (MAC).                           After obtaining informed consent, the endoscope was                            passed under direct vision. Throughout the                            procedure, the patient's blood pressure, pulse, and                            oxygen saturations were monitored continuously. The                            GIF-H190 ([id number]) was introduced through the                            mouth, and advanced to the second part of duodenum.                            The upper GI endoscopy was accomplished without                            difficulty. The patient tolerated the procedure                            well. Scope In: 1:37:04 PM Scope Out:  1:42:06 PM Total Procedure Duration: 0 hours 5 minutes 2 seconds  Findings:      One benign-appearing, intrinsic mild stenosis was found in the lower       third of the esophagus. The stenosis was traversed. A TTS dilator was       passed through the scope. Dilation with an 18-19-20 mm balloon dilator       was performed to 18 mm. The dilation site was examined and showed mild        mucosal disruption and moderate improvement in luminal narrowing.      Segmental mild inflammation characterized by erosions, erythema and       shallow ulcerations was found in the gastric body.      The duodenal bulb, first portion of the duodenum and second portion of       the duodenum were normal. Impression:               - Benign-appearing esophageal stenosis. Dilated.                           - Gastritis.                           - Normal duodenal bulb, first portion of the                            duodenum and second portion of the duodenum.                           - No specimens collected. Moderate Sedation:      Per Anesthesia Care Recommendation:           - Patient has a contact number available for                            emergencies. The signs and symptoms of potential                            delayed complications were discussed with the                            patient. Return to normal activities tomorrow.                            Written discharge instructions were provided to the                            patient.                           - Resume previous diet.                           - Continue present medications.                           - Await pathology results.                           -  Repeat upper endoscopy PRN for retreatment.                           - Use a proton pump inhibitor PO BID. Procedure Code(s):        --- Professional ---                           [phone], Esophagogastroduodenoscopy, flexible,                            transoral; with transendoscopic balloon dilation of                            esophagus (less than 30 mm diameter) Diagnosis Code(s):        --- Professional ---                           K22.2, Esophageal obstruction                           K29.70, Gastritis, unspecified, without bleeding                           R13.10, Dysphagia, unspecified CPT copyright 2019 American Medical Association. All rights  reserved. The codes documented in this report are preliminary and upon coder review may  be revised to meet current compliance requirements. Elon Alas. Abbey Chatters, DO Galt Abbey Chatters, DO 06/25/2020 1:49:47 PM This report has been signed electronically. Number of Addenda: 0

## 2020-06-25 NOTE — Interval H&P Note (Signed)
History and Physical Interval Note:  06/25/2020 1:00 PM  Christy [name]  has presented today for surgery, with the diagnosis of dysphagia, small bowel ulcers.  The various methods of treatment have been discussed with the patient and family. After consideration of risks, benefits and other options for treatment, the patient has consented to  Procedure(s) with comments: ESOPHAGOGASTRODUODENOSCOPY (EGD) WITH PROPOFOL (N/A) - 1:45pm BALLOON DILATION (N/A) as a surgical intervention.  The patient's history has been reviewed, patient examined, no change in status, stable for surgery.  I have reviewed the patient's chart and labs.  Questions were answered to the patient's satisfaction.     [name]

## 2020-06-25 NOTE — Discharge Instructions (Addendum)
Monitored Anesthesia Care, Care After This sheet gives you information about how to care for yourself after your procedure. Your health care provider may also give you more specific instructions. If you have problems or questions, contact your health care provider. What can I expect after the procedure? After the procedure, it is common to have:  Tiredness.  Forgetfulness about what happened after the procedure.  Impaired judgment for important decisions.  Nausea or vomiting.  Some difficulty with balance. Follow these instructions at home: For the time period you were told by your health care provider:  Rest as needed.  Do not participate in activities where you could fall or become injured.  Do not drive or use machinery.  Do not drink alcohol.  Do not take sleeping pills or medicines that cause drowsiness.  Do not make important decisions or sign legal documents.  Do not take care of children on your own.      Eating and drinking  Follow the diet that is recommended by your health care provider.  Drink enough fluid to keep your urine pale yellow.  If you vomit: ? Drink water, juice, or soup when you can drink without vomiting. ? Make sure you have little or no nausea before eating solid foods. General instructions  Have a responsible adult stay with you for the time you are told. It is important to have someone help care for you until you are awake and alert.  Take over-the-counter and prescription medicines only as told by your health care provider.  If you have sleep apnea, surgery and certain medicines can increase your risk for breathing problems. Follow instructions from your health care provider about wearing your sleep device: ? Anytime you are sleeping, including during daytime naps. ? While taking prescription pain medicines, sleeping medicines, or medicines that make you drowsy.  Avoid smoking.  Keep all follow-up visits as told by your health care  provider. This is important. Contact a health care provider if:  You keep feeling nauseous or you keep vomiting.  You feel light-headed.  You are still sleepy or having trouble with balance after 24 hours.  You develop a rash.  You have a fever.  You have redness or swelling around the IV site. Get help right away if:  You have trouble breathing.  You have new-onset confusion at home. Summary  For several hours after your procedure, you may feel tired. You may also be forgetful and have poor judgment.  Have a responsible adult stay with you for the time you are told. It is important to have someone help care for you until you are awake and alert.  Rest as told. Do not drive or operate machinery. Do not drink alcohol or take sleeping pills.  Get help right away if you have trouble breathing, or if you suddenly become confused. This information is not intended to replace advice given to you by your health care provider. Make sure you discuss any questions you have with your health care provider. Document Revised: 09/21/2019 Document Reviewed: 12/08/2018 Elsevier Patient Education  2021 [place].  EGD Discharge instructions Please read the instructions outlined below and refer to this sheet in the next few weeks. These discharge instructions provide you with general information on caring for yourself after you leave the hospital. Your doctor may also give you specific instructions. While your treatment has been planned according to the most current medical practices available, unavoidable complications occasionally occur. If you have any problems or questions  after discharge, please call your doctor. ACTIVITY  You may resume your regular activity but move at a slower pace for the next 24 hours.   Take frequent rest periods for the next 24 hours.   Walking will help expel (get rid of) the air and reduce the bloated feeling in your abdomen.   No driving for 24 hours  (because of the anesthesia (medicine) used during the test).   You may shower.   Do not sign any important legal documents or operate any machinery for 24 hours (because of the anesthesia used during the test).  NUTRITION  Drink plenty of fluids.   You may resume your normal diet.   Begin with a light meal and progress to your normal diet.   Avoid alcoholic beverages for 24 hours or as instructed by your caregiver.  MEDICATIONS  You may resume your normal medications unless your caregiver tells you otherwise.  WHAT YOU CAN EXPECT TODAY  You may experience abdominal discomfort such as a feeling of fullness or "gas" pains.  FOLLOW-UP  Your doctor will discuss the results of your test with you.  SEEK IMMEDIATE MEDICAL ATTENTION IF ANY OF THE FOLLOWING OCCUR:  Excessive nausea (feeling sick to your stomach) and/or vomiting.   Severe abdominal pain and distention (swelling).   Trouble swallowing.   Temperature over 101 F (37.8 C).   Rectal bleeding or vomiting of blood.   Your EGD showed that the previously noted ulcers in your small bowel have completely healed.  You continue to have small amount of inflammation in your stomach.  I did redilate your esophagus and believe I got a good treatment today.  Hopefully this helps with your swallowing.  Continue on Dexilant.  Await pathology results, my office will contact you.  Follow-up with GI in 3 to 4 months.   I hope you have a great rest of your week!  Elon Alas. Abbey Chatters, D.O. Gastroenterology and Hepatology [facility]

## 2020-06-25 NOTE — Transfer of Care (Signed)
Immediate Anesthesia Transfer of Care Note  Patient: Christy [name]  Procedure(s) Performed: ESOPHAGOGASTRODUODENOSCOPY (EGD) WITH PROPOFOL (N/A ) BALLOON DILATION (N/A )  Patient Location: Short Stay  Anesthesia Type:MAC  Level of Consciousness: awake, alert  and oriented  Airway & Oxygen Therapy: Patient Spontanous Breathing  Post-op Assessment: Report given to RN and Post -op Vital signs reviewed and stable  Post vital signs: Reviewed and stable  Last Vitals:  Vitals Value Taken Time  BP    Temp    Pulse    Resp    SpO2      Last Pain:  Vitals:   06/25/20 1332  TempSrc:   PainSc: 0-No pain      Patients Stated Pain Goal: 5 (26/71/24 5809)  Complications: No complications documented.

## 2020-06-25 NOTE — Anesthesia Preprocedure Evaluation (Signed)
Anesthesia Evaluation  Patient identified by MRN, date of birth, ID band Patient awake    Reviewed: Allergy & Precautions, NPO status , Patient's Chart, lab work & pertinent test results, reviewed documented beta blocker date and time   History of Anesthesia Complications Negative for: history of anesthetic complications  Airway Mallampati: II  TM Distance: >3 FB Neck ROM: Full    Dental  (+) Dental Advisory Given, Teeth Intact   Pulmonary sleep apnea and Continuous Positive Airway Pressure Ventilation , former smoker,    Pulmonary exam normal breath sounds clear to auscultation       Cardiovascular Exercise Tolerance: Poor hypertension, Pt. on medications and Pt. on home beta blockers + CAD, + Cardiac Stents and +CHF  Normal cardiovascular exam+ dysrhythmias Supra Ventricular Tachycardia  Rhythm:Regular Rate:Normal  1. Left ventricular ejection fraction, by visual estimation, is 55 to 60%. The left ventricle has normal function. There is mildly increased left ventricular hypertrophy.  2. Left ventricular diastolic parameters are consistent with Grade II diastolic dysfunction (pseudonormalization).  3. Moderately dilated left ventricular internal cavity size.  4. The left ventricle has no regional wall motion abnormalities.  5. Global right ventricle has normal systolic function.The right ventricular size is normal. No increase in right ventricular wall thickness.  6. Left atrial size was moderately dilated.  7. Right atrial size was normal.  8. Mild mitral annular calcification.  9. The mitral valve is grossly normal. Mild to moderate mitral valve regurgitation.  10. The tricuspid valve is grossly normal.  11. The aortic valve is tricuspid. Aortic valve regurgitation is not visualized.  12. The pulmonic valve was grossly normal. Pulmonic valve regurgitation is trivial.  13. Normal pulmonary artery systolic pressure.  14.  The tricuspid regurgitant velocity is 2.33 m/s, and with an assumed right atrial pressure of 8 mmHg, the estimated right ventricular systolic pressure is normal at 29.7 mmHg.  15. The inferior vena cava is normal in size with greater than 50% respiratory variability, suggesting right atrial pressure of 3 mmHg.    Neuro/Psych PSYCHIATRIC DISORDERS Bipolar Disorder negative neurological ROS     GI/Hepatic Neg liver ROS, GERD  Medicated,  Endo/Other  diabetes, Well Controlled, Type 2, Insulin Dependent  Renal/GU Renal InsufficiencyRenal disease  negative genitourinary   Musculoskeletal  (+) Arthritis , Osteoarthritis,    Abdominal   Peds negative pediatric ROS (+)  Hematology  (+) anemia ,   Anesthesia Other Findings   Reproductive/Obstetrics negative OB ROS                            Anesthesia Physical  Anesthesia Plan  ASA: III  Anesthesia Plan: General   Post-op Pain Management:    Induction: Intravenous  PONV Risk Score and Plan: TIVA  Airway Management Planned: Nasal Cannula, Natural Airway and Simple Face Mask  Additional Equipment:   Intra-op Plan:   Post-operative Plan:   Informed Consent: I have reviewed the patients History and Physical, chart, labs and discussed the procedure including the risks, benefits and alternatives for the proposed anesthesia with the patient or authorized representative who has indicated his/her understanding and acceptance.     Dental advisory given  Plan Discussed with: CRNA and Surgeon  Anesthesia Plan Comments:         Anesthesia Quick Evaluation

## 2020-07-03 ENCOUNTER — Encounter (HOSPITAL_COMMUNITY): Payer: Self-pay | Admitting: Internal Medicine

## 2020-07-09 DIAGNOSIS — I10 Essential (primary) hypertension: Secondary | ICD-10-CM | POA: Diagnosis not present

## 2020-07-09 DIAGNOSIS — I509 Heart failure, unspecified: Secondary | ICD-10-CM | POA: Diagnosis not present

## 2020-07-09 DIAGNOSIS — Z6841 Body Mass Index (BMI) 40.0 and over, adult: Secondary | ICD-10-CM | POA: Diagnosis not present

## 2020-07-09 DIAGNOSIS — Z299 Encounter for prophylactic measures, unspecified: Secondary | ICD-10-CM | POA: Diagnosis not present

## 2020-07-09 DIAGNOSIS — K219 Gastro-esophageal reflux disease without esophagitis: Secondary | ICD-10-CM | POA: Diagnosis not present

## 2020-07-09 DIAGNOSIS — E1165 Type 2 diabetes mellitus with hyperglycemia: Secondary | ICD-10-CM | POA: Diagnosis not present

## 2020-07-12 ENCOUNTER — Telehealth: Payer: Self-pay

## 2020-07-12 DIAGNOSIS — E2839 Other primary ovarian failure: Secondary | ICD-10-CM | POA: Diagnosis not present

## 2020-07-12 NOTE — Telephone Encounter (Signed)
FYI: We got another PA over for the pt for Dexlansoprazole 60 mgDR capsules (of which has been done and denied). I phoned to the pt's pharmacy to find out if the pt's insurance had changed and they said no and I asked the cost of the pt's medication which they filled for her on 07/05/2020 and the pt still had not picked up. The cost after PA from before is $95.32 and it is $83.35 now. (Still waiting for the pt to pick up). I phoned the pt and LMOVM regarding her Rx and to call us back. I will give the pt a savings card to see if it will help more with the cost. I see now why it was denied because it was already at the lowest cost for the pt.

## 2020-07-15 ENCOUNTER — Telehealth: Payer: Self-pay | Admitting: Internal Medicine

## 2020-07-15 NOTE — Telephone Encounter (Signed)
Already spoke with the pt, see the June 24th note

## 2020-07-15 NOTE — Telephone Encounter (Signed)
Phoned and spoke with the pt advised of the medication cost and the PA's that was done for it. Pt agreed to go get the medication and try it and let me know how it works for her then just maybe we can get the brand name Dexilant.

## 2020-07-15 NOTE — Telephone Encounter (Signed)
Pt returning call from Friday. [phone]

## 2020-07-17 DIAGNOSIS — F411 Generalized anxiety disorder: Secondary | ICD-10-CM | POA: Diagnosis not present

## 2020-07-17 DIAGNOSIS — F4310 Post-traumatic stress disorder, unspecified: Secondary | ICD-10-CM | POA: Diagnosis not present

## 2020-07-17 DIAGNOSIS — F3112 Bipolar disorder, current episode manic without psychotic features, moderate: Secondary | ICD-10-CM | POA: Diagnosis not present

## 2020-07-18 DIAGNOSIS — I10 Essential (primary) hypertension: Secondary | ICD-10-CM | POA: Diagnosis not present

## 2020-07-23 DIAGNOSIS — Z1231 Encounter for screening mammogram for malignant neoplasm of breast: Secondary | ICD-10-CM | POA: Diagnosis not present

## 2020-08-01 DIAGNOSIS — E211 Secondary hyperparathyroidism, not elsewhere classified: Secondary | ICD-10-CM | POA: Diagnosis not present

## 2020-08-07 DIAGNOSIS — R809 Proteinuria, unspecified: Secondary | ICD-10-CM | POA: Diagnosis not present

## 2020-08-07 DIAGNOSIS — N189 Chronic kidney disease, unspecified: Secondary | ICD-10-CM | POA: Diagnosis not present

## 2020-08-07 DIAGNOSIS — D638 Anemia in other chronic diseases classified elsewhere: Secondary | ICD-10-CM | POA: Diagnosis not present

## 2020-08-07 DIAGNOSIS — E875 Hyperkalemia: Secondary | ICD-10-CM | POA: Diagnosis not present

## 2020-08-07 DIAGNOSIS — E1129 Type 2 diabetes mellitus with other diabetic kidney complication: Secondary | ICD-10-CM | POA: Diagnosis not present

## 2020-08-07 DIAGNOSIS — I5032 Chronic diastolic (congestive) heart failure: Secondary | ICD-10-CM | POA: Diagnosis not present

## 2020-08-07 DIAGNOSIS — E1122 Type 2 diabetes mellitus with diabetic chronic kidney disease: Secondary | ICD-10-CM | POA: Diagnosis not present

## 2020-08-07 DIAGNOSIS — E211 Secondary hyperparathyroidism, not elsewhere classified: Secondary | ICD-10-CM | POA: Diagnosis not present

## 2020-08-07 DIAGNOSIS — I129 Hypertensive chronic kidney disease with stage 1 through stage 4 chronic kidney disease, or unspecified chronic kidney disease: Secondary | ICD-10-CM | POA: Diagnosis not present

## 2020-08-16 DIAGNOSIS — I10 Essential (primary) hypertension: Secondary | ICD-10-CM | POA: Diagnosis not present

## 2020-08-19 DIAGNOSIS — E1140 Type 2 diabetes mellitus with diabetic neuropathy, unspecified: Secondary | ICD-10-CM | POA: Diagnosis not present

## 2020-08-19 DIAGNOSIS — L110 Acquired keratosis follicularis: Secondary | ICD-10-CM | POA: Diagnosis not present

## 2020-08-19 DIAGNOSIS — L609 Nail disorder, unspecified: Secondary | ICD-10-CM | POA: Diagnosis not present

## 2020-08-26 ENCOUNTER — Other Ambulatory Visit: Payer: Self-pay | Admitting: Internal Medicine

## 2020-08-28 ENCOUNTER — Other Ambulatory Visit: Payer: Self-pay

## 2020-08-28 ENCOUNTER — Encounter (INDEPENDENT_AMBULATORY_CARE_PROVIDER_SITE_OTHER): Payer: Medicare Other | Admitting: Ophthalmology

## 2020-08-28 DIAGNOSIS — E113393 Type 2 diabetes mellitus with moderate nonproliferative diabetic retinopathy without macular edema, bilateral: Secondary | ICD-10-CM | POA: Diagnosis not present

## 2020-08-28 DIAGNOSIS — H35033 Hypertensive retinopathy, bilateral: Secondary | ICD-10-CM

## 2020-08-28 DIAGNOSIS — H43813 Vitreous degeneration, bilateral: Secondary | ICD-10-CM

## 2020-08-28 DIAGNOSIS — I10 Essential (primary) hypertension: Secondary | ICD-10-CM

## 2020-09-03 DIAGNOSIS — N189 Chronic kidney disease, unspecified: Secondary | ICD-10-CM | POA: Diagnosis not present

## 2020-09-03 DIAGNOSIS — R809 Proteinuria, unspecified: Secondary | ICD-10-CM | POA: Diagnosis not present

## 2020-09-03 DIAGNOSIS — I129 Hypertensive chronic kidney disease with stage 1 through stage 4 chronic kidney disease, or unspecified chronic kidney disease: Secondary | ICD-10-CM | POA: Diagnosis not present

## 2020-09-03 DIAGNOSIS — I5032 Chronic diastolic (congestive) heart failure: Secondary | ICD-10-CM | POA: Diagnosis not present

## 2020-09-03 DIAGNOSIS — E875 Hyperkalemia: Secondary | ICD-10-CM | POA: Diagnosis not present

## 2020-09-10 ENCOUNTER — Other Ambulatory Visit: Payer: Self-pay

## 2020-09-10 ENCOUNTER — Inpatient Hospital Stay (HOSPITAL_COMMUNITY): Payer: Medicare Other | Attending: Hematology

## 2020-09-10 DIAGNOSIS — D528 Other folate deficiency anemias: Secondary | ICD-10-CM

## 2020-09-10 DIAGNOSIS — N184 Chronic kidney disease, stage 4 (severe): Secondary | ICD-10-CM | POA: Insufficient documentation

## 2020-09-10 DIAGNOSIS — Z79899 Other long term (current) drug therapy: Secondary | ICD-10-CM | POA: Diagnosis not present

## 2020-09-10 DIAGNOSIS — D472 Monoclonal gammopathy: Secondary | ICD-10-CM

## 2020-09-10 DIAGNOSIS — E611 Iron deficiency: Secondary | ICD-10-CM | POA: Diagnosis not present

## 2020-09-10 DIAGNOSIS — D508 Other iron deficiency anemias: Secondary | ICD-10-CM

## 2020-09-10 DIAGNOSIS — D519 Vitamin B12 deficiency anemia, unspecified: Secondary | ICD-10-CM

## 2020-09-10 LAB — CBC WITH DIFFERENTIAL/PLATELET
Abs Immature Granulocytes: 0.02 10*3/uL (ref 0.00–0.07)
Basophils Absolute: 0.1 10*3/uL (ref 0.0–0.1)
Basophils Relative: 1 %
Eosinophils Absolute: 0.6 10*3/uL — ABNORMAL HIGH (ref 0.0–0.5)
Eosinophils Relative: 9 %
HCT: 33.8 % — ABNORMAL LOW (ref 36.0–46.0)
Hemoglobin: 10.7 g/dL — ABNORMAL LOW (ref 12.0–15.0)
Immature Granulocytes: 0 %
Lymphocytes Relative: 19 %
Lymphs Abs: 1.4 10*3/uL (ref 0.7–4.0)
MCH: 31.6 pg (ref 26.0–34.0)
MCHC: 31.7 g/dL (ref 30.0–36.0)
MCV: 99.7 fL (ref 80.0–100.0)
Monocytes Absolute: 0.4 10*3/uL (ref 0.1–1.0)
Monocytes Relative: 6 %
Neutro Abs: 4.5 10*3/uL (ref 1.7–7.7)
Neutrophils Relative %: 65 %
Platelets: 166 10*3/uL (ref 150–400)
RBC: 3.39 MIL/uL — ABNORMAL LOW (ref 3.87–5.11)
RDW: 13.1 % (ref 11.5–15.5)
WBC: 7 10*3/uL (ref 4.0–10.5)
nRBC: 0 % (ref 0.0–0.2)

## 2020-09-10 LAB — COMPREHENSIVE METABOLIC PANEL
ALT: 10 U/L (ref 0–44)
AST: 13 U/L — ABNORMAL LOW (ref 15–41)
Albumin: 3.4 g/dL — ABNORMAL LOW (ref 3.5–5.0)
Alkaline Phosphatase: 67 U/L (ref 38–126)
Anion gap: 8 (ref 5–15)
BUN: 59 mg/dL — ABNORMAL HIGH (ref 8–23)
CO2: 28 mmol/L (ref 22–32)
Calcium: 8.2 mg/dL — ABNORMAL LOW (ref 8.9–10.3)
Chloride: 102 mmol/L (ref 98–111)
Creatinine, Ser: 1.7 mg/dL — ABNORMAL HIGH (ref 0.44–1.00)
GFR, Estimated: 32 mL/min — ABNORMAL LOW (ref >60)
Glucose, Bld: 197 mg/dL — ABNORMAL HIGH (ref 70–99)
Potassium: 4.5 mmol/L (ref 3.5–5.1)
Sodium: 138 mmol/L (ref 135–145)
Total Bilirubin: 0.4 mg/dL (ref 0.3–1.2)
Total Protein: 6.7 g/dL (ref 6.5–8.1)

## 2020-09-10 LAB — FERRITIN: Ferritin: 174 ng/mL (ref 11–307)

## 2020-09-10 LAB — IRON AND TIBC
Iron: 67 ug/dL (ref 28–170)
Saturation Ratios: 28 % (ref 10.4–31.8)
TIBC: 244 ug/dL — ABNORMAL LOW (ref 250–450)
UIBC: 177 ug/dL

## 2020-09-10 LAB — VITAMIN B12: Vitamin B-12: 525 pg/mL (ref 180–914)

## 2020-09-10 LAB — FOLATE: Folate: 14.8 ng/mL (ref >5.9)

## 2020-09-10 LAB — LACTATE DEHYDROGENASE: LDH: 177 U/L (ref 98–192)

## 2020-09-11 LAB — PROTEIN ELECTROPHORESIS, SERUM
A/G Ratio: 1.2 (ref 0.7–1.7)
Albumin ELP: 3.3 g/dL (ref 2.9–4.4)
Alpha-1-Globulin: 0.2 g/dL (ref 0.0–0.4)
Alpha-2-Globulin: 0.6 g/dL (ref 0.4–1.0)
Beta Globulin: 0.9 g/dL (ref 0.7–1.3)
Gamma Globulin: 1.2 g/dL (ref 0.4–1.8)
Globulin, Total: 2.8 g/dL (ref 2.2–3.9)
M-Spike, %: 0.6 g/dL — ABNORMAL HIGH
Total Protein ELP: 6.1 g/dL (ref 6.0–8.5)

## 2020-09-11 LAB — KAPPA/LAMBDA LIGHT CHAINS
Kappa free light chain: 85.5 mg/L — ABNORMAL HIGH (ref 3.3–19.4)
Kappa, lambda light chain ratio: 1.69 — ABNORMAL HIGH (ref 0.26–1.65)
Lambda free light chains: 50.6 mg/L — ABNORMAL HIGH (ref 5.7–26.3)

## 2020-09-13 DIAGNOSIS — R809 Proteinuria, unspecified: Secondary | ICD-10-CM | POA: Diagnosis not present

## 2020-09-13 DIAGNOSIS — I5032 Chronic diastolic (congestive) heart failure: Secondary | ICD-10-CM | POA: Diagnosis not present

## 2020-09-13 DIAGNOSIS — I129 Hypertensive chronic kidney disease with stage 1 through stage 4 chronic kidney disease, or unspecified chronic kidney disease: Secondary | ICD-10-CM | POA: Diagnosis not present

## 2020-09-13 DIAGNOSIS — N189 Chronic kidney disease, unspecified: Secondary | ICD-10-CM | POA: Diagnosis not present

## 2020-09-13 DIAGNOSIS — D638 Anemia in other chronic diseases classified elsewhere: Secondary | ICD-10-CM | POA: Diagnosis not present

## 2020-09-13 DIAGNOSIS — E875 Hyperkalemia: Secondary | ICD-10-CM | POA: Diagnosis not present

## 2020-09-13 DIAGNOSIS — E1129 Type 2 diabetes mellitus with other diabetic kidney complication: Secondary | ICD-10-CM | POA: Diagnosis not present

## 2020-09-13 DIAGNOSIS — D472 Monoclonal gammopathy: Secondary | ICD-10-CM | POA: Diagnosis not present

## 2020-09-13 DIAGNOSIS — E1122 Type 2 diabetes mellitus with diabetic chronic kidney disease: Secondary | ICD-10-CM | POA: Diagnosis not present

## 2020-09-15 NOTE — Progress Notes (Signed)
[facility] [address], [place] [address]   CLINIC:  Medical Oncology/Hematology  PCP:  Kirstie Peri, MD [address] [place] [address]  [phone]  REASON FOR VISIT:  Follow-up for MGUS  PRIOR THERAPY: none  CURRENT THERAPY: not done  INTERVAL HISTORY:  Christy [name], a 70 y.o. female, returns for routine follow-up for her MGUS. Christy [name] was last seen on 06/06/20.  Today she repots feeling well. She denies any new pains and any tingling/numbness. She has received iron infusions previously, but she has never taken iron tablets.   REVIEW OF SYSTEMS:  Review of Systems  Constitutional:  Negative for fatigue (80%).  Neurological:  Negative for numbness.  All other systems reviewed and are negative.  PAST MEDICAL/SURGICAL HISTORY:  Past Medical History:  Diagnosis Date   Arthritis    Bipolar disorder (HCC)    CHF (congestive heart failure) (HCC)    Coronary atherosclerosis of native coronary artery    BMS circ 2006, 70% PDA 2009   Essential hypertension    Mixed hyperlipidemia    OSA on CPAP    Renal tubular acidosis, type IV    SVT (supraventricular tachycardia) (HCC)    Type 2 diabetes mellitus (HCC)    Past Surgical History:  Procedure Laterality Date   Acromioclavicular arthritis     BALLOON DILATION N/A 12/12/2019   Procedure: BALLOON DILATION;  Surgeon: Lanelle Bal, DO;  [facility];  Service: Endoscopy;  Laterality: N/A;   BALLOON DILATION N/A 06/25/2020   Procedure: BALLOON DILATION;  Surgeon: Lanelle Bal, DO;  [facility];  Service: Endoscopy;  Laterality: N/A;   BIOPSY  12/12/2019   Procedure: BIOPSY;  Surgeon: Lanelle Bal, DO;  [facility];  Service: Endoscopy;;   CARDIAC CATHETERIZATION N/A 03/29/2015   Procedure: Left Heart Cath and Coronary Angiography;  Surgeon: Lennette Bihari, MD;  Location: [facility] INVASIVE CV LAB;  Service: Cardiovascular;  Laterality: N/A;   CATARACT  EXTRACTION Bilateral    COLONOSCOPY WITH PROPOFOL N/A 12/12/2019   Procedure: COLONOSCOPY WITH PROPOFOL;  Surgeon: Lanelle Bal, DO;  [facility];  Service: Endoscopy;  Laterality: N/A;  2:30pm   ESOPHAGOGASTRODUODENOSCOPY (EGD) WITH PROPOFOL N/A 12/12/2019   Procedure: ESOPHAGOGASTRODUODENOSCOPY (EGD) WITH PROPOFOL;  Surgeon: Lanelle Bal, DO;  [facility];  Service: Endoscopy;  Laterality: N/A;   ESOPHAGOGASTRODUODENOSCOPY (EGD) WITH PROPOFOL N/A 06/25/2020   Procedure: ESOPHAGOGASTRODUODENOSCOPY (EGD) WITH PROPOFOL;  Surgeon: Lanelle Bal, DO;  [facility];  Service: Endoscopy;  Laterality: N/A;  1:45pm   POLYPECTOMY  12/12/2019   Procedure: POLYPECTOMY;  Surgeon: Lanelle Bal, DO;  [facility];  Service: Endoscopy;;   RIght shoulder adhesive capsulitis     Rotator cuff impingement syndrome Right    Rotator cuff tear      SOCIAL HISTORY:  Social History   Socioeconomic History   Marital status: Divorced    Spouse name: Not on file   Number of children: Not on file   Years of education: Not on file   Highest education level: Not on file  Occupational History   Occupation: PLANNING AND INSPECTOR    Employer: [employer]  Tobacco Use   Smoking status: Former    Packs/day: 1.00    Years: 20.00    Pack years: 20.00    Types: Cigarettes    Start date: 10/30/1984    Quit date:  07/08/2013    Years since quitting: 7.1   Smokeless tobacco: Never  Vaping Use   Vaping Use: Never used  Substance and Sexual Activity   Alcohol use: No    Alcohol/week: 0.0 standard drinks   Drug use: No   Sexual activity: Not Currently  Other Topics Concern   Not on file  Social History Narrative   She has lived with some of her daughters. Works part-time for the city.    Social Determinants of Health   Financial Resource Strain: Not on file  Food Insecurity: Not on file  Transportation Needs: Not on file  Physical Activity: Not  on file  Stress: Not on file  Social Connections: Not on file  Intimate Partner Violence: Not on file    FAMILY HISTORY:  Family History  Problem Relation Age of Onset   Bipolar disorder Brother    Diabetes Father     CURRENT MEDICATIONS:  Current Outpatient Medications  Medication Sig Dispense Refill   allopurinol (ZYLOPRIM) 300 MG tablet Take 150 mg by mouth in the morning.     amLODipine (NORVASC) 5 MG tablet Take 5 mg by mouth at bedtime.      aspirin 81 MG EC tablet Take 81 mg by mouth at bedtime.      BESIVANCE 0.6 % SUSP Place 1 drop into the left eye See admin instructions. Instill one drop into the left eye four times daily for 2 days after each monthly injection  12   buPROPion (WELLBUTRIN XL) 300 MG 24 hr tablet Take 300 mg by mouth in the morning.     calcium carbonate (OS-CAL) 600 MG TABS Take 600 mg by mouth daily.       carvedilol (COREG) 12.5 MG tablet Take 12.5 mg by mouth 2 (two) times daily with a meal.     Cholecalciferol (VITAMIN D3) 25 MCG (1000 UT) CAPS Take 1,000 Units by mouth daily.      clotrimazole (LOTRIMIN) 1 % cream Apply 1 application topically daily as needed (itching).     DEXILANT 60 MG capsule TAKE ONE CAPSULE BY MOUTH DAILY 30 capsule 5   divalproex (DEPAKOTE) 250 MG EC tablet Take 250 mg by mouth in the morning.     GLOBAL EASE INJECT PEN NEEDLES 32G X 4 MM MISC See admin instructions.     hydrALAZINE (APRESOLINE) 50 MG tablet Take 50 mg by mouth 3 (three) times daily.     isosorbide mononitrate (IMDUR) 60 MG 24 hr tablet Take 60 mg by mouth in the morning.     losartan (COZAAR) 25 MG tablet Take 12.5 mg by mouth daily.     meclizine (ANTIVERT) 25 MG tablet Take 25 mg by mouth as needed for dizziness.     metolazone (ZAROXOLYN) 5 MG tablet Take 5 mg by mouth daily as needed (fluid retention/shortness of breath).     Multiple Vitamin (MULTIVITAMIN) tablet Take 1 tablet by mouth daily.       nitroGLYCERIN (NITROLINGUAL) 0.4 MG/SPRAY spray Place 1  spray under the tongue every 5 (five) minutes x 3 doses as needed. (Patient taking differently: Place 1 spray under the tongue every 5 (five) minutes x 3 doses as needed for chest pain.) 12 g 2   NOVOLOG FLEXPEN 100 UNIT/ML FlexPen Inject 5-12 Units into the skin 3 (three) times daily as needed for high blood sugar.      potassium chloride (KLOR-CON) 10 MEQ tablet Take 10 mEq by mouth daily as needed (when taking metolazone (  fluid retention/shortness of breath)).     rosuvastatin (CRESTOR) 10 MG tablet Take 10 mg by mouth in the morning.     sertraline (ZOLOFT) 100 MG tablet Take 200 mg by mouth in the morning.     tiZANidine (ZANAFLEX) 4 MG tablet Take 4 mg by mouth every evening.     torsemide (DEMADEX) 20 MG tablet Take 60 mg by mouth in the morning.      traZODone (DESYREL) 100 MG tablet Take 100 mg by mouth at bedtime.   3   TRESIBA FLEXTOUCH 200 UNIT/ML SOPN Inject 32 Units into the skin in the morning.  2   VICTOZA 18 MG/3ML SOPN Inject 1.2 mg into the skin every evening.      No current facility-administered medications for this visit.    ALLERGIES:  Allergies  Allergen Reactions   Ace Inhibitors Cough   Lisinopril Cough    Severe cough   Penicillins     Unknown, childhood reaction   Tetracycline Nausea Only   Lamictal [Lamotrigine] Rash    Rash start inside to outter body.    PHYSICAL EXAM:  Performance status (ECOG): 1 - Symptomatic but completely ambulatory  There were no vitals filed for this visit. Wt Readings from Last 3 Encounters:  06/19/20 217 lb (98.4 kg)  06/12/20 220 lb 6.4 oz (100 kg)  06/06/20 219 lb 6.4 oz (99.5 kg)   Physical Exam Vitals reviewed.  Constitutional:      Appearance: Normal appearance.  Cardiovascular:     Rate and Rhythm: Normal rate and regular rhythm.     Pulses: Normal pulses.     Heart sounds: Normal heart sounds.  Pulmonary:     Effort: Pulmonary effort is normal.     Breath sounds: Normal breath sounds.  Neurological:      General: No focal deficit present.     Mental Status: She is alert and oriented to person, place, and time.  Psychiatric:        Mood and Affect: Mood normal.        Behavior: Behavior normal.    LABORATORY DATA:  I have reviewed the labs as listed.  CBC Latest Ref Rng & Units 09/10/2020 05/30/2020 01/25/2020  WBC 4.0 - 10.5 K/uL 7.0 5.8 10.6(H)  Hemoglobin 12.0 - 15.0 g/dL 10.7(L) 10.2(L) 10.7(L)  Hematocrit 36.0 - 46.0 % 33.8(L) 32.1(L) 34.5(L)  Platelets 150 - 400 K/uL 166 164 196   CMP Latest Ref Rng & Units 09/10/2020 06/19/2020 01/25/2020  Glucose 70 - 99 mg/dL 197(H) 98 131(H)  BUN 8 - 23 mg/dL 59(H) 52(H) 31(H)  Creatinine 0.44 - 1.00 mg/dL 1.70(H) 1.58(H) 1.10(H)  Sodium 135 - 145 mmol/L 138 139 140  Potassium 3.5 - 5.1 mmol/L 4.5 3.9 3.7  Chloride 98 - 111 mmol/L 102 104 104  CO2 22 - 32 mmol/L $RemoveB'28 27 28  'vpmQkiPq$ Calcium 8.9 - 10.3 mg/dL 8.2(L) 8.2(L) 8.3(L)  Total Protein 6.5 - 8.1 g/dL 6.7 - 5.9(L)  Total Bilirubin 0.3 - 1.2 mg/dL 0.4 - 0.6  Alkaline Phos 38 - 126 U/L 67 - 77  AST 15 - 41 U/L 13(L) - 16  ALT 0 - 44 U/L 10 - 14      Component Value Date/Time   RBC 3.39 (L) 09/10/2020 1216   MCV 99.7 09/10/2020 1216   MCH 31.6 09/10/2020 1216   MCHC 31.7 09/10/2020 1216   RDW 13.1 09/10/2020 1216   LYMPHSABS 1.4 09/10/2020 1216   MONOABS 0.4 09/10/2020 1216  EOSABS 0.6 (H) 09/10/2020 1216   BASOSABS 0.1 09/10/2020 1216    DIAGNOSTIC IMAGING:  I have independently reviewed the scans and discussed with the patient. No results found.   ASSESSMENT:  1.  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  She was noted to have an elevated creatinine and was seen by nephrologist in December 2014.  She had an SPEP done as part of her work-up for kidney disease and was noted to have a M spike of 0.6 with normal lambda light chain ratio.  Immunofixation showed IgG kappa monoclonal protein.  Patient had hep C  antibody which was negative, ANA negative and C ANCA negative, P-ANCA negative, anti-double-stranded DNA antibody negative and complements were within normal limits.  Her creatinine at that time was 1.32 and hemoglobin was 11.8.  Also she had no significant protein in her urine. -She also had a bone scan which was noted to be negative.  She never had a bone marrow biopsy done at this time. - Patient was being followed by Dr. Mariel Sleet at our clinic since early 2017. - Labs on 06/01/2018 shows potassium 5.1, creatinine 1.67, calcium 8.6, LFTs were normal, LDH 213, SPEP stable with M spike 0.6 g/dL.  WBC 7.8, hemoglobin 9.9, platelets 156.  Light chain ratio 1.82. -Labs on 07/05/2018 showed her potassium 5.5, creatinine 1.72, calcium 8.7, hemoglobin 10.1. -We repeated her potassium at her visit on 07/12/2018 which showed her potassium 4.8. - She reports no B symptoms including fevers, chills, night sweats, or unexplained weight loss. -Skeletal survey done on 07/05/2018 showed no lytic lesions. -Bone marrow biopsy done on 11/25/2018.  Pathology consistent with hypercellular marrow with trilineage hematopoiesis and 2% plasma cells.  FISH was negative.  Cytogenetics was normal. -When trending her anemia and renal function since 2017 it has progressively gotten worse.  Which is concerning for multiple myeloma.  Patient was sent to [facility] for second opinion. -Bone marrow biopsy done by [facility] on 02/08/2019.  Pathology was consistent with normocellular bone marrow with trilineage hematopoiesis.  And 3% plasma cells.  FISH was negative.  Negative for amyloid deposits position by stain. -Cardiac MRI done at [facility] on 02/08/2019 showed the left ventricle moderately dilated in size with normal wall thickness.  Global systolic function is moderately reduced with a LVEF of 44%. [facility] referred her to a cardiologist in their system for a second opinion  she sees this doctor on 03/06/2019.  She is following up with them closely. -Repeat labs done on 06/23/2019 showed M spike of 0.8 g/dL, creatinine 9.83, hemoglobin 10.8, ferritin 79, percent saturation 25     2.  Anemia: -Likely from CKD and iron deficiency. -Last Feraheme on 07/07/2019 and 07/14/2019.   3.  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 -Follows up with nephrology.   PLAN:  1.  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 - No B symptoms or infections. - Reviewed labs from 09/10/2020.  Calcium is normal.  Creatinine is stable.  M spike is 0.6 g.  Free light chain ratio is 1.69 with kappa light chains 85. - No "crab" features at this time.  No tingling or numbness. - RTC 6 months for follow-up with repeat labs and skeletal survey.   2.  Anemia: - Denies any bleeding per rectum or melena. - Hemoglobin is stable around 10.7.  Ferritin is 174 and percent saturation is 28.  Folic acid and D57 were normal. - She does not require any parenteral iron therapy at this time.  We will monitor her counts in 6 months.   3.  Renal insufficiency: - Creatinine is 1.7.  Continue follow-up with Dr. Theador Hawthorne.  Orders placed this encounter:  No orders of the defined types were placed in this encounter.    Derek Jack, MD [place] [phone]   I, Thana Ates, am acting as a scribe for Dr. Derek Jack.  I, Derek Jack MD, have reviewed the above documentation for accuracy and completeness, and I agree with the above.

## 2020-09-16 ENCOUNTER — Inpatient Hospital Stay (HOSPITAL_BASED_OUTPATIENT_CLINIC_OR_DEPARTMENT_OTHER): Payer: Medicare Other | Admitting: Hematology

## 2020-09-16 ENCOUNTER — Other Ambulatory Visit: Payer: Self-pay

## 2020-09-16 VITALS — BP 150/46 | HR 58 | Temp 98.5°F | Resp 20 | Wt 215.6 lb

## 2020-09-16 DIAGNOSIS — D508 Other iron deficiency anemias: Secondary | ICD-10-CM | POA: Diagnosis not present

## 2020-09-16 DIAGNOSIS — E611 Iron deficiency: Secondary | ICD-10-CM | POA: Diagnosis not present

## 2020-09-16 DIAGNOSIS — D528 Other folate deficiency anemias: Secondary | ICD-10-CM

## 2020-09-16 DIAGNOSIS — D519 Vitamin B12 deficiency anemia, unspecified: Secondary | ICD-10-CM

## 2020-09-16 DIAGNOSIS — D472 Monoclonal gammopathy: Secondary | ICD-10-CM

## 2020-09-16 DIAGNOSIS — N184 Chronic kidney disease, stage 4 (severe): Secondary | ICD-10-CM | POA: Diagnosis not present

## 2020-09-16 DIAGNOSIS — Z79899 Other long term (current) drug therapy: Secondary | ICD-10-CM | POA: Diagnosis not present

## 2020-09-16 NOTE — Patient Instructions (Addendum)
[place] at [facility] Discharge Instructions  You were seen today by Dr. Delton Coombes. He went over your recent results. You will be scheduled for a scan of your skeleton prior to your next visit. Dr. Delton Coombes will see you back in 6 months for labs and follow up.   Thank you for choosing [place] at [facility] to provide your oncology and hematology care.  To afford each patient quality time with our provider, please arrive at least 15 minutes before your scheduled appointment time.   If you have a lab appointment with the [facility] please come in thru the Main Entrance and check in at the main information desk  You need to re-schedule your appointment should you arrive 10 or more minutes late.  We strive to give you quality time with our providers, and arriving late affects you and other patients whose appointments are after yours.  Also, if you no show three or more times for appointments you may be dismissed from the clinic at the providers discretion.     Again, thank you for choosing [facility].  Our hope is that these requests will decrease the amount of time that you wait before being seen by our physicians.       _____________________________________________________________  Should you have questions after your visit to [facility], please contact our office at [phone] between the hours of 8:00 a.m. and 4:30 p.m.  Voicemails left after 4:00 p.m. will not be returned until the following business day.  For prescription refill requests, have your pharmacy contact our office and allow 72 hours.    Cancer Center Support Programs:   > Cancer Support Group  2nd Tuesday of the month 1pm-2pm, Journey Room

## 2020-09-18 DIAGNOSIS — I10 Essential (primary) hypertension: Secondary | ICD-10-CM | POA: Diagnosis not present

## 2020-10-11 DIAGNOSIS — Z6841 Body Mass Index (BMI) 40.0 and over, adult: Secondary | ICD-10-CM | POA: Diagnosis not present

## 2020-10-11 DIAGNOSIS — I10 Essential (primary) hypertension: Secondary | ICD-10-CM | POA: Diagnosis not present

## 2020-10-11 DIAGNOSIS — Z23 Encounter for immunization: Secondary | ICD-10-CM | POA: Diagnosis not present

## 2020-10-11 DIAGNOSIS — E11319 Type 2 diabetes mellitus with unspecified diabetic retinopathy without macular edema: Secondary | ICD-10-CM | POA: Diagnosis not present

## 2020-10-11 DIAGNOSIS — E1165 Type 2 diabetes mellitus with hyperglycemia: Secondary | ICD-10-CM | POA: Diagnosis not present

## 2020-10-11 DIAGNOSIS — E1122 Type 2 diabetes mellitus with diabetic chronic kidney disease: Secondary | ICD-10-CM | POA: Diagnosis not present

## 2020-10-11 DIAGNOSIS — Z299 Encounter for prophylactic measures, unspecified: Secondary | ICD-10-CM | POA: Diagnosis not present

## 2020-10-18 DIAGNOSIS — I10 Essential (primary) hypertension: Secondary | ICD-10-CM | POA: Diagnosis not present

## 2020-10-28 DIAGNOSIS — L609 Nail disorder, unspecified: Secondary | ICD-10-CM | POA: Diagnosis not present

## 2020-10-28 DIAGNOSIS — L110 Acquired keratosis follicularis: Secondary | ICD-10-CM | POA: Diagnosis not present

## 2020-10-28 DIAGNOSIS — E1140 Type 2 diabetes mellitus with diabetic neuropathy, unspecified: Secondary | ICD-10-CM | POA: Diagnosis not present

## 2020-11-12 DIAGNOSIS — N186 End stage renal disease: Secondary | ICD-10-CM | POA: Diagnosis not present

## 2020-11-12 DIAGNOSIS — R809 Proteinuria, unspecified: Secondary | ICD-10-CM | POA: Diagnosis not present

## 2020-11-12 DIAGNOSIS — E1122 Type 2 diabetes mellitus with diabetic chronic kidney disease: Secondary | ICD-10-CM | POA: Diagnosis not present

## 2020-11-12 DIAGNOSIS — E875 Hyperkalemia: Secondary | ICD-10-CM | POA: Diagnosis not present

## 2020-11-12 DIAGNOSIS — N189 Chronic kidney disease, unspecified: Secondary | ICD-10-CM | POA: Diagnosis not present

## 2020-11-15 DIAGNOSIS — I5032 Chronic diastolic (congestive) heart failure: Secondary | ICD-10-CM | POA: Diagnosis not present

## 2020-11-15 DIAGNOSIS — R809 Proteinuria, unspecified: Secondary | ICD-10-CM | POA: Diagnosis not present

## 2020-11-15 DIAGNOSIS — E1122 Type 2 diabetes mellitus with diabetic chronic kidney disease: Secondary | ICD-10-CM | POA: Diagnosis not present

## 2020-11-15 DIAGNOSIS — D638 Anemia in other chronic diseases classified elsewhere: Secondary | ICD-10-CM | POA: Diagnosis not present

## 2020-11-15 DIAGNOSIS — N189 Chronic kidney disease, unspecified: Secondary | ICD-10-CM | POA: Diagnosis not present

## 2020-11-15 DIAGNOSIS — I129 Hypertensive chronic kidney disease with stage 1 through stage 4 chronic kidney disease, or unspecified chronic kidney disease: Secondary | ICD-10-CM | POA: Diagnosis not present

## 2020-11-15 DIAGNOSIS — E1129 Type 2 diabetes mellitus with other diabetic kidney complication: Secondary | ICD-10-CM | POA: Diagnosis not present

## 2020-11-18 DIAGNOSIS — I10 Essential (primary) hypertension: Secondary | ICD-10-CM | POA: Diagnosis not present

## 2020-11-24 IMAGING — DX METASTATIC BONE SURVEY
9 of 10 series · 9 of 10 positions shown · non-contrast
Comparison: None.

CLINICAL DATA: Monoclonal gammopathy of unknown significance.

EXAM:
METASTATIC BONE SURVEY

[skull lat]
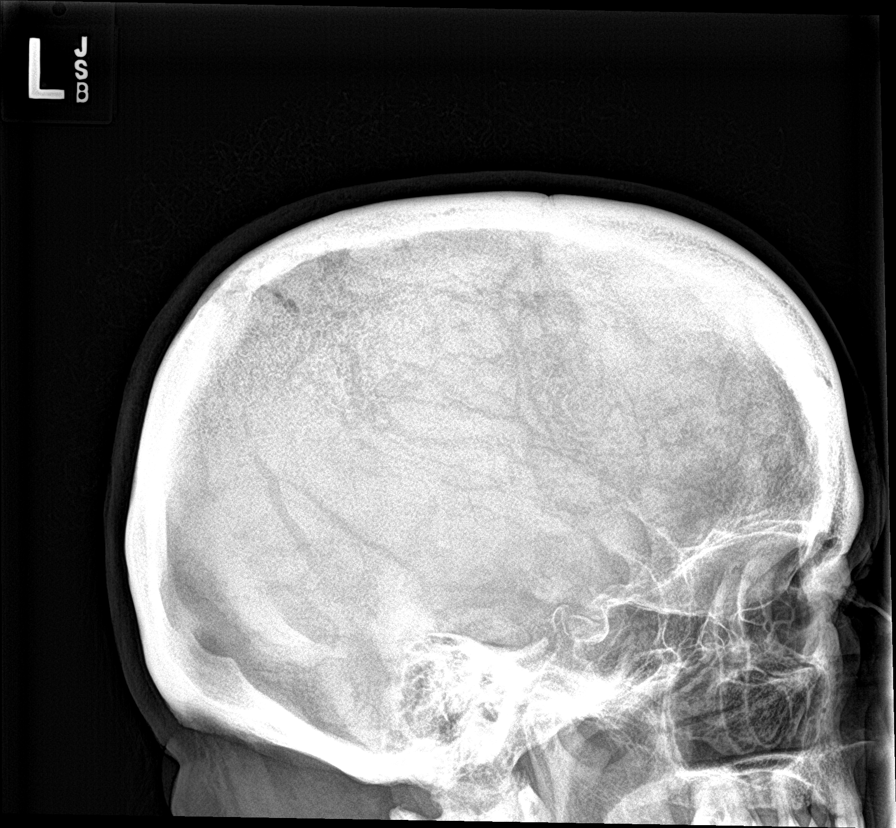

[shoulder ap (1 of 2)]
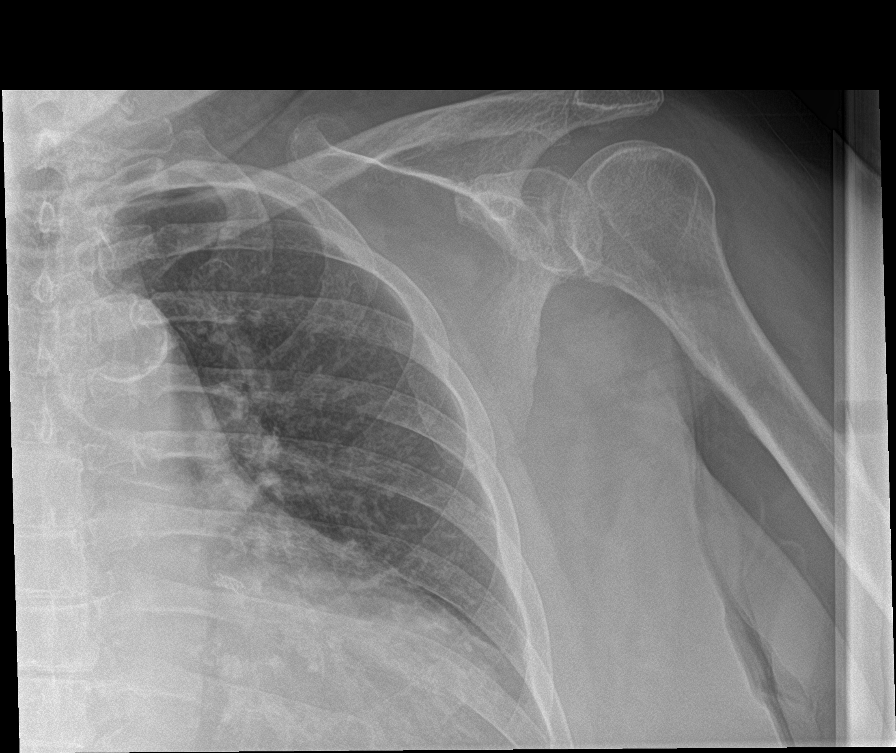

[shoulder ap (2 of 2)]
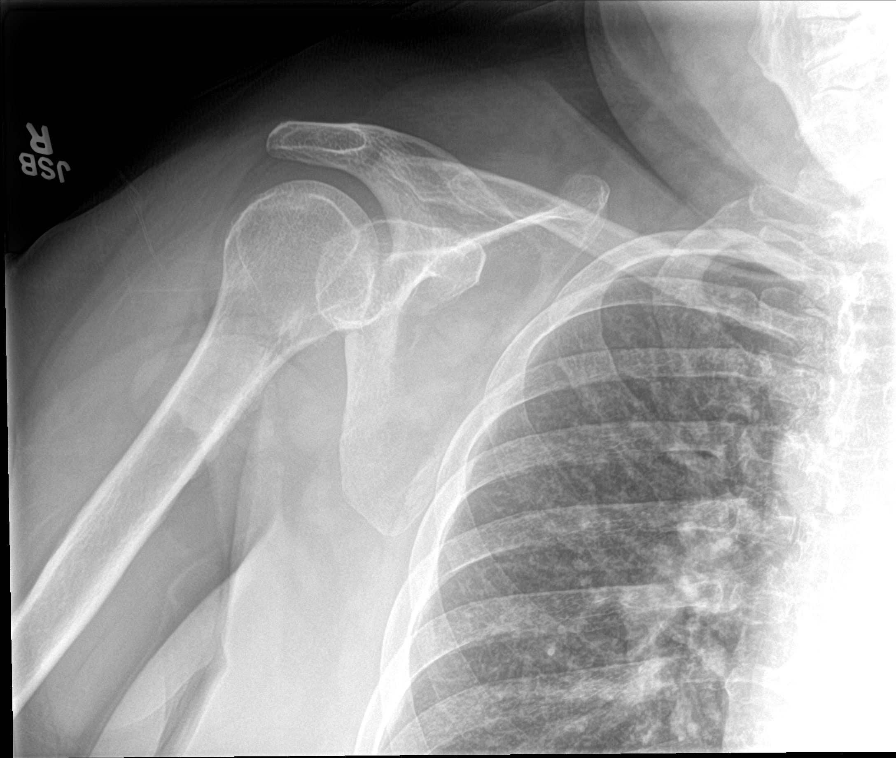

[humerus ap (1 of 2)]
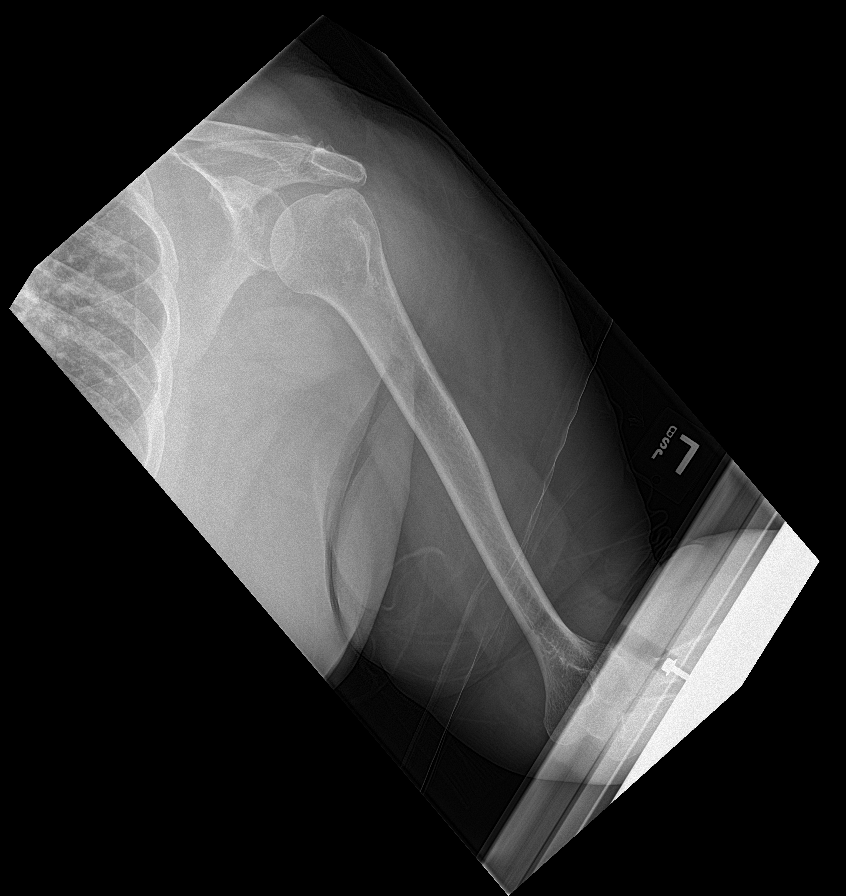

[humerus ap (2 of 2)]
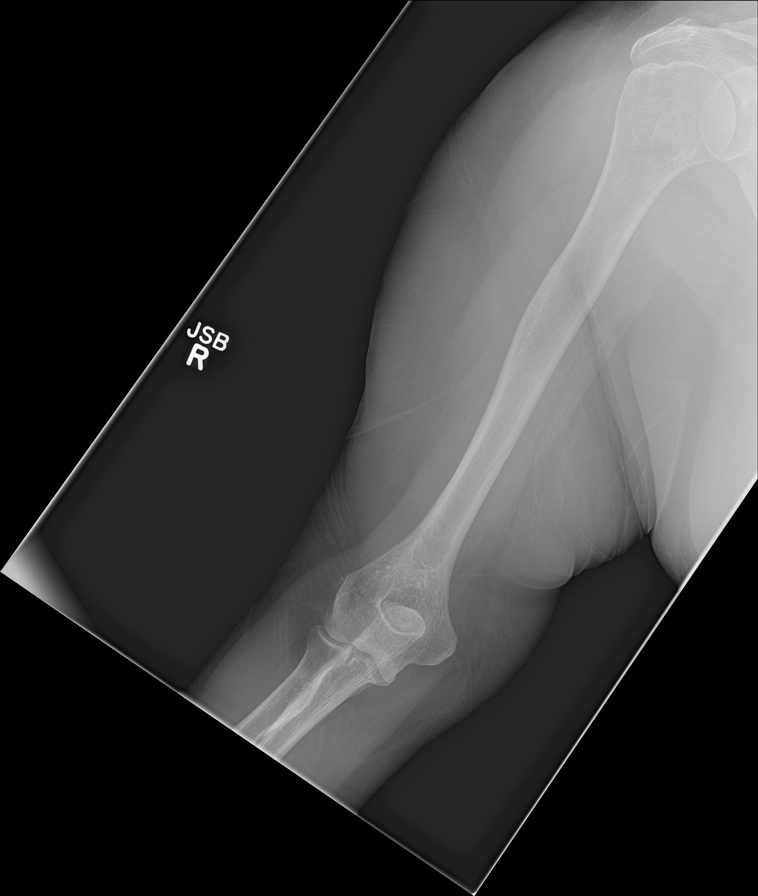

[forearm ap (1 of 2)]
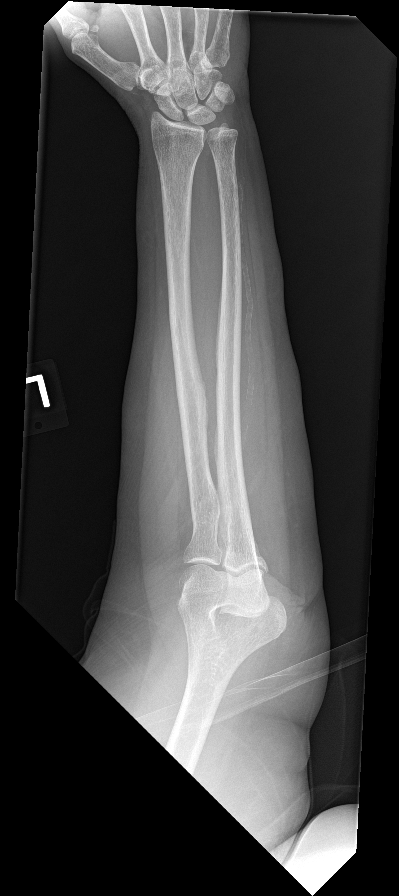

[forearm ap (2 of 2)]
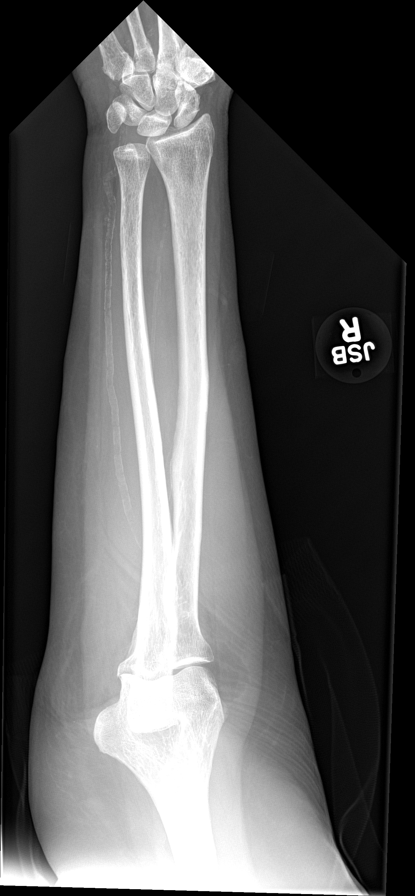

[c-spine ap]
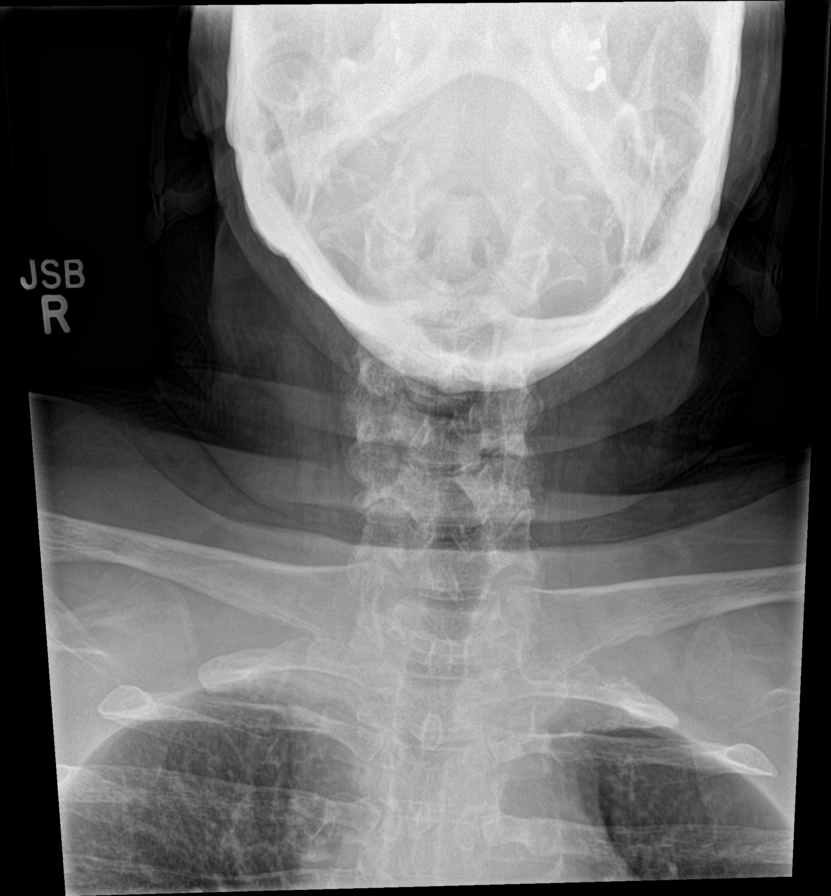

[c-spine lat]
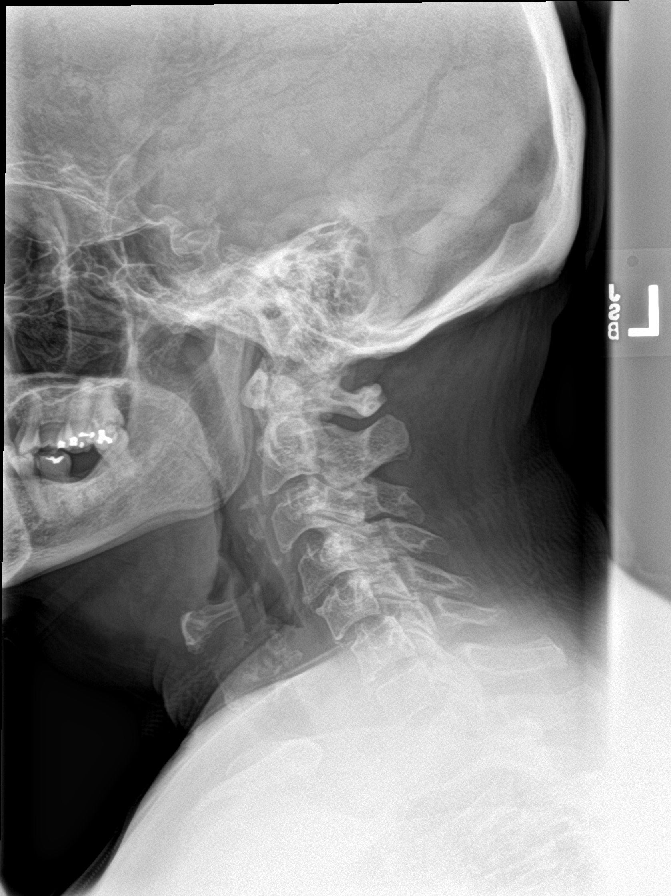

[9 of 10 positions shown; findings below may reference images not displayed]

FINDINGS: There are no lytic lesions in the skeleton. Incidental note is made
of mild cardiomegaly and aortic atherosclerosis. Degenerative disc
and joint disease in the cervical spine. Previous resection of the
distal right clavicle.
IMPRESSION: 1. No evidence of lytic lesions to suggest multiple myeloma.
2.  Aortic Atherosclerosis ([id number]).

## 2020-12-11 DIAGNOSIS — F411 Generalized anxiety disorder: Secondary | ICD-10-CM | POA: Diagnosis not present

## 2020-12-11 DIAGNOSIS — F3112 Bipolar disorder, current episode manic without psychotic features, moderate: Secondary | ICD-10-CM | POA: Diagnosis not present

## 2020-12-11 DIAGNOSIS — F4310 Post-traumatic stress disorder, unspecified: Secondary | ICD-10-CM | POA: Diagnosis not present

## 2020-12-16 DIAGNOSIS — I428 Other cardiomyopathies: Secondary | ICD-10-CM | POA: Diagnosis not present

## 2020-12-16 DIAGNOSIS — I447 Left bundle-branch block, unspecified: Secondary | ICD-10-CM | POA: Diagnosis not present

## 2020-12-16 DIAGNOSIS — I2510 Atherosclerotic heart disease of native coronary artery without angina pectoris: Secondary | ICD-10-CM | POA: Diagnosis not present

## 2020-12-18 DIAGNOSIS — I10 Essential (primary) hypertension: Secondary | ICD-10-CM | POA: Diagnosis not present

## 2020-12-19 DIAGNOSIS — Z20828 Contact with and (suspected) exposure to other viral communicable diseases: Secondary | ICD-10-CM | POA: Diagnosis not present

## 2021-01-01 ENCOUNTER — Other Ambulatory Visit: Payer: Self-pay

## 2021-01-01 ENCOUNTER — Encounter: Payer: Self-pay | Admitting: Internal Medicine

## 2021-01-01 ENCOUNTER — Telehealth: Payer: Self-pay

## 2021-01-01 ENCOUNTER — Ambulatory Visit (INDEPENDENT_AMBULATORY_CARE_PROVIDER_SITE_OTHER): Payer: Medicare Other | Admitting: Internal Medicine

## 2021-01-01 VITALS — BP 129/59 | HR 58 | Temp 96.9°F | Ht 63.0 in | Wt 207.0 lb

## 2021-01-01 DIAGNOSIS — R1031 Right lower quadrant pain: Secondary | ICD-10-CM

## 2021-01-01 DIAGNOSIS — R197 Diarrhea, unspecified: Secondary | ICD-10-CM

## 2021-01-01 DIAGNOSIS — K219 Gastro-esophageal reflux disease without esophagitis: Secondary | ICD-10-CM

## 2021-01-01 MED ORDER — ESOMEPRAZOLE MAGNESIUM 40 MG PO CPDR: 40.0000 mg | DELAYED_RELEASE_CAPSULE | Freq: Two times a day (BID) | ORAL | 11 refills | Status: AC

## 2021-01-01 NOTE — Patient Instructions (Signed)
I am going to order stool studies to rule out infectious causes of your diarrhea and abdominal pain.  You will need to drop a sample off at [facility].  I will send in prescription for Nexium 40 mg twice daily to your pharmacy for your chronic reflux/indigestion.  Follow-up with GI in 2 to 3 months.  It was nice seeing you again today.  Dr. Abbey Chatters  At [facility] we value your feedback. You may receive a survey about your visit today. Please share your experience as we strive to create trusting relationships with our patients to provide genuine, compassionate, quality care.  We appreciate your understanding and patience as we review any laboratory studies, imaging, and other diagnostic tests that are ordered as we care for you. Our office policy is 5 business days for review of these results, and any emergent or urgent results are addressed in a timely manner for your best interest. If you do not hear from our office in 1 week, please contact us.   We also encourage the use of MyChart, which contains your medical information for your review as well. If you are not enrolled in this feature, an access code is on this after visit summary for your convenience. Thank you for allowing [place] to be involved in your care.  It was great to see you today!  I hope you have a great rest of your Winter!    Elon Alas. Abbey Chatters, D.O. Gastroenterology and Hepatology [facility]

## 2021-01-01 NOTE — Telephone Encounter (Signed)
Pt approved for Esomeprazole Mag Cap DR (non-formulary). Pt approved 01/20/2020-01/01/2022. Will advise the pt and the pharmacy.

## 2021-01-01 NOTE — Progress Notes (Signed)
Referring Provider: Monico Blitz, MD Primary Care Physician:  Monico Blitz, MD Primary GI:  Dr. Abbey Chatters  Chief Complaint  Patient presents with   Rectal Bleeding    Friday and Saturday, none since. Doesn't feel good   loose stool    HPI:   Christy [name] is a 70 y.o. female who presents to the clinic today for urgent visit.  She states approximately 5 days ago she had sudden onset of right lower quadrant pain as well as diarrhea.  Notes numerous loose stools that day and the day that followed.  This is slowly improved.  Continues to have loose stools though volume has slowed down dramatically.  Right lower quadrant pain is intermittent.  Nontender.  Does not radiate.  Has not noticed any blood or mucus in her stools.  Has had some bright red blood on tissue paper after bowel movements.  No sick contacts at home.  No recent antibiotics use.  Chronically has GERD for which she takes Dexilant.  This keeps her symptoms under well control though she makes mention today that insurance states they will no longer cover this medication starting January 19, 2021  Past Medical History:  Diagnosis Date   Arthritis    Bipolar disorder ([place])    CHF (congestive heart failure) ([place])    Coronary atherosclerosis of native coronary artery    BMS circ 2006, 70% PDA 2009   Essential hypertension    Mixed hyperlipidemia    OSA on CPAP    Renal tubular acidosis, type IV    SVT (supraventricular tachycardia) ([facility])    Type 2 diabetes mellitus ([place])     Past Surgical History:  Procedure Laterality Date   Acromioclavicular arthritis     BALLOON DILATION N/A 12/12/2019   Procedure: BALLOON DILATION;  Surgeon: [name], DO;  [facility];  Service: Endoscopy;  Laterality: N/A;   BALLOON DILATION N/A 06/25/2020   Procedure: BALLOON DILATION;  Surgeon: [name], DO;  [facility];  Service: Endoscopy;  Laterality: N/A;   BIOPSY  12/12/2019   Procedure: BIOPSY;   Surgeon: [name], DO;  [facility];  Service: Endoscopy;;   CARDIAC CATHETERIZATION N/A 03/29/2015   Procedure: Left Heart Cath and Coronary Angiography;  Surgeon: Troy Sine, MD;  Location: [facility];  Service: Cardiovascular;  Laterality: N/A;   CATARACT EXTRACTION Bilateral    COLONOSCOPY WITH PROPOFOL N/A 12/12/2019   Procedure: COLONOSCOPY WITH PROPOFOL;  Surgeon: [name], DO;  [facility];  Service: Endoscopy;  Laterality: N/A;  2:30pm   ESOPHAGOGASTRODUODENOSCOPY (EGD) WITH PROPOFOL N/A 12/12/2019   Procedure: ESOPHAGOGASTRODUODENOSCOPY (EGD) WITH PROPOFOL;  Surgeon: [name], DO;  [facility];  Service: Endoscopy;  Laterality: N/A;   ESOPHAGOGASTRODUODENOSCOPY (EGD) WITH PROPOFOL N/A 06/25/2020   Procedure: ESOPHAGOGASTRODUODENOSCOPY (EGD) WITH PROPOFOL;  Surgeon: [name], DO;  [facility];  Service: Endoscopy;  Laterality: N/A;  1:45pm   POLYPECTOMY  12/12/2019   Procedure: POLYPECTOMY;  Surgeon: [name], DO;  [facility];  Service: Endoscopy;;   RIght shoulder adhesive capsulitis     Rotator cuff impingement syndrome Right    Rotator cuff tear      Current Outpatient Medications  Medication Sig Dispense Refill   allopurinol (ZYLOPRIM) 300 MG tablet Take 150 mg by mouth in the morning.     amLODipine (NORVASC) 5 MG tablet Take 5 mg by  mouth at bedtime.      aspirin 81 MG EC tablet Take 81 mg by mouth at bedtime.      BESIVANCE 0.6 % SUSP Place 1 drop into the left eye See admin instructions. Instill one drop into the left eye four times daily for 2 days after each monthly injection  12   buPROPion (WELLBUTRIN XL) 300 MG 24 hr tablet Take 300 mg by mouth in the morning.     calcium carbonate (OS-CAL) 600 MG TABS Take 600 mg by mouth daily.       carvedilol (COREG) 12.5 MG tablet Take 12.5 mg by mouth 2 (two) times daily with a meal.     Cholecalciferol (VITAMIN D3)  25 MCG (1000 UT) CAPS Take 1,000 Units by mouth daily.      clotrimazole (LOTRIMIN) 1 % cream Apply 1 application topically daily as needed (itching).     divalproex (DEPAKOTE) 250 MG EC tablet Take 250 mg by mouth in the morning.     esomeprazole (NEXIUM) 40 MG capsule Take 1 capsule (40 mg total) by mouth 2 (two) times daily before a meal. 60 capsule 11   GLOBAL EASE INJECT PEN NEEDLES 32G X 4 MM MISC See admin instructions.     hydrALAZINE (APRESOLINE) 50 MG tablet Take 50 mg by mouth 3 (three) times daily.     isosorbide mononitrate (IMDUR) 60 MG 24 hr tablet Take 60 mg by mouth in the morning.     losartan (COZAAR) 50 MG tablet Take 50 mg by mouth daily.     meclizine (ANTIVERT) 25 MG tablet Take 25 mg by mouth as needed for dizziness.     metolazone (ZAROXOLYN) 5 MG tablet Take 5 mg by mouth daily as needed (fluid retention/shortness of breath).     Multiple Vitamin (MULTIVITAMIN) tablet Take 1 tablet by mouth daily.       nitroGLYCERIN (NITROLINGUAL) 0.4 MG/SPRAY spray Place 1 spray under the tongue every 5 (five) minutes x 3 doses as needed. 12 g 2   NOVOLOG FLEXPEN 100 UNIT/ML FlexPen Inject 5-12 Units into the skin 3 (three) times daily as needed for high blood sugar.     potassium chloride (KLOR-CON) 10 MEQ tablet Take 10 mEq by mouth daily as needed (when taking metolazone (fluid retention/shortness of breath)).     rosuvastatin (CRESTOR) 10 MG tablet Take 10 mg by mouth in the morning.     sertraline (ZOLOFT) 100 MG tablet Take 100 mg by mouth 2 (two) times daily.     tiZANidine (ZANAFLEX) 4 MG tablet Take 4 mg by mouth every evening.     torsemide (DEMADEX) 20 MG tablet Take 60 mg by mouth in the morning.      traZODone (DESYREL) 100 MG tablet Take 100 mg by mouth at bedtime.   3   TRESIBA FLEXTOUCH 200 UNIT/ML SOPN Inject 28 Units into the skin in the morning.  2   VICTOZA 18 MG/3ML SOPN Inject 1.2 mg into the skin as needed.     losartan (COZAAR) 25 MG tablet Take 12.5 mg by  mouth daily. (Patient not taking: Reported on 01/01/2021)     No current facility-administered medications for this visit.    Allergies as of 01/01/2021 - Review Complete 01/01/2021  Allergen Reaction Noted   Ace inhibitors Cough 08/27/2010   Lisinopril Cough 09/25/2015   Penicillins  12/14/2007   Tetracycline Nausea Only 12/14/2007   Lamictal [lamotrigine] Rash 08/27/2010    Family History  Problem Relation Age  of Onset   Bipolar disorder Brother    Diabetes Father     Social History   Socioeconomic History   Marital status: Divorced    Spouse name: Not on file   Number of children: Not on file   Years of education: Not on file   Highest education level: Not on file  Occupational History   Occupation: PLANNING AND INSPECTOR    Employer: [employer]  Tobacco Use   Smoking status: Former    Packs/day: 1.00    Years: 20.00    Pack years: 20.00    Types: Cigarettes    Start date: 10/30/1984    Quit date: 07/08/2013    Years since quitting: 7.4   Smokeless tobacco: Never  Vaping Use   Vaping Use: Never used  Substance and Sexual Activity   Alcohol use: No    Alcohol/week: 0.0 standard drinks   Drug use: No   Sexual activity: Not Currently  Other Topics Concern   Not on file  Social History Narrative   She has lived with some of her daughters. Works part-time for the city.    Social Determinants of Health   Financial Resource Strain: Not on file  Food Insecurity: Not on file  Transportation Needs: Not on file  Physical Activity: Not on file  Stress: Not on file  Social Connections: Not on file    Subjective: Review of Systems  Constitutional:  Negative for chills and fever.  HENT:  Negative for congestion and hearing loss.   Eyes:  Negative for blurred vision and double vision.  Respiratory:  Negative for cough and shortness of breath.   Cardiovascular:  Negative for chest pain and palpitations.  Gastrointestinal:  Positive for abdominal pain, diarrhea  and heartburn. Negative for blood in stool, constipation, melena and vomiting.  Genitourinary:  Negative for dysuria and urgency.  Musculoskeletal:  Negative for joint pain and myalgias.  Skin:  Negative for itching and rash.  Neurological:  Negative for dizziness and headaches.  Psychiatric/Behavioral:  Negative for depression. The patient is not nervous/anxious.     Objective: BP (!) 129/59    Pulse (!) 58    Temp (!) 96.9 F (36.1 C) (Temporal)    Ht 5\' 3"  (1.6 m)    Wt 207 lb (93.9 kg)    BMI 36.67 kg/m  Physical Exam Constitutional:      Appearance: Normal appearance. She is obese.  HENT:     Head: Normocephalic and atraumatic.  Eyes:     Extraocular Movements: Extraocular movements intact.     Conjunctiva/sclera: Conjunctivae normal.  Cardiovascular:     Rate and Rhythm: Normal rate and regular rhythm.  Pulmonary:     Effort: Pulmonary effort is normal.     Breath sounds: Normal breath sounds.  Abdominal:     General: Bowel sounds are normal.     Palpations: Abdomen is soft.  Musculoskeletal:        General: No swelling. Normal range of motion.     Cervical back: Normal range of motion and neck supple.  Skin:    General: Skin is warm and dry.     Coloration: Skin is not jaundiced.  Neurological:     General: No focal deficit present.     Mental Status: She is alert and oriented to person, place, and time.  Psychiatric:        Mood and Affect: Mood normal.        Behavior: Behavior normal.  Assessment: *Diarrhea-new onset *Right lower quadrant abdominal pain-new *Chronic reflux-well-controlled on Dexilant   Plan: Etiology of patient's new onset diarrhea and abdominal pain unclear.  Possible infectious gastroenteritis.  Will order stool studies to further evaluate.  Abdominal exam reassuring today.  Do not think we need to pursue cross-sectional imaging at present.  Continue supportive care for now.  Recommended adequate p.o. hydration.  Does appear to be  improving compared to initial onset.  GERD well-controlled on Dexilant though patient states insurance is no longer going to pay for this medication.  We will switch her to Nexium.  Follow-up in 2 to 3 months  01/01/2021 3:55 PM   Disclaimer: This note was dictated with voice recognition software. Similar sounding words can inadvertently be transcribed and may not be corrected upon review.

## 2021-01-01 NOTE — Telephone Encounter (Signed)
PA done for Esomeprazole 40 mg cap (dx: R 13.19, K22.2, K26.9). tried/failed: Omeprazole and Dexilant). Waiting on response from [employer].

## 2021-01-07 DIAGNOSIS — E1140 Type 2 diabetes mellitus with diabetic neuropathy, unspecified: Secondary | ICD-10-CM | POA: Diagnosis not present

## 2021-01-07 DIAGNOSIS — L110 Acquired keratosis follicularis: Secondary | ICD-10-CM | POA: Diagnosis not present

## 2021-01-07 DIAGNOSIS — L609 Nail disorder, unspecified: Secondary | ICD-10-CM | POA: Diagnosis not present

## 2021-01-17 DIAGNOSIS — E1122 Type 2 diabetes mellitus with diabetic chronic kidney disease: Secondary | ICD-10-CM | POA: Diagnosis not present

## 2021-01-17 DIAGNOSIS — R809 Proteinuria, unspecified: Secondary | ICD-10-CM | POA: Diagnosis not present

## 2021-01-17 DIAGNOSIS — I10 Essential (primary) hypertension: Secondary | ICD-10-CM | POA: Diagnosis not present

## 2021-01-17 DIAGNOSIS — N186 End stage renal disease: Secondary | ICD-10-CM | POA: Diagnosis not present

## 2021-01-17 DIAGNOSIS — N189 Chronic kidney disease, unspecified: Secondary | ICD-10-CM | POA: Diagnosis not present

## 2021-01-23 DIAGNOSIS — R809 Proteinuria, unspecified: Secondary | ICD-10-CM | POA: Diagnosis not present

## 2021-01-23 DIAGNOSIS — E1129 Type 2 diabetes mellitus with other diabetic kidney complication: Secondary | ICD-10-CM | POA: Diagnosis not present

## 2021-01-23 DIAGNOSIS — E875 Hyperkalemia: Secondary | ICD-10-CM | POA: Diagnosis not present

## 2021-01-23 DIAGNOSIS — N189 Chronic kidney disease, unspecified: Secondary | ICD-10-CM | POA: Diagnosis not present

## 2021-01-23 DIAGNOSIS — Z20828 Contact with and (suspected) exposure to other viral communicable diseases: Secondary | ICD-10-CM | POA: Diagnosis not present

## 2021-01-23 DIAGNOSIS — I5032 Chronic diastolic (congestive) heart failure: Secondary | ICD-10-CM | POA: Diagnosis not present

## 2021-01-23 DIAGNOSIS — E1122 Type 2 diabetes mellitus with diabetic chronic kidney disease: Secondary | ICD-10-CM | POA: Diagnosis not present

## 2021-01-23 DIAGNOSIS — E211 Secondary hyperparathyroidism, not elsewhere classified: Secondary | ICD-10-CM | POA: Diagnosis not present

## 2021-01-27 DIAGNOSIS — Z299 Encounter for prophylactic measures, unspecified: Secondary | ICD-10-CM | POA: Diagnosis not present

## 2021-01-27 DIAGNOSIS — I5032 Chronic diastolic (congestive) heart failure: Secondary | ICD-10-CM | POA: Diagnosis not present

## 2021-01-27 DIAGNOSIS — Z6838 Body mass index (BMI) 38.0-38.9, adult: Secondary | ICD-10-CM | POA: Diagnosis not present

## 2021-01-27 DIAGNOSIS — Z87891 Personal history of nicotine dependence: Secondary | ICD-10-CM | POA: Diagnosis not present

## 2021-01-27 DIAGNOSIS — E1165 Type 2 diabetes mellitus with hyperglycemia: Secondary | ICD-10-CM | POA: Diagnosis not present

## 2021-01-27 DIAGNOSIS — E1142 Type 2 diabetes mellitus with diabetic polyneuropathy: Secondary | ICD-10-CM | POA: Diagnosis not present

## 2021-01-27 DIAGNOSIS — I10 Essential (primary) hypertension: Secondary | ICD-10-CM | POA: Diagnosis not present

## 2021-01-30 DIAGNOSIS — N189 Chronic kidney disease, unspecified: Secondary | ICD-10-CM | POA: Diagnosis not present

## 2021-01-31 ENCOUNTER — Telehealth: Payer: Self-pay

## 2021-01-31 NOTE — Telephone Encounter (Signed)
FYI: Documentation from [facility] ([place]) regarding the pt's Rx for Esomeprazole Mag Cap 40 mg DR. Pt just had it refilled on 12/14 so its good for now until they send notice or PA. Left side of desk in tray.

## 2021-02-16 DIAGNOSIS — I10 Essential (primary) hypertension: Secondary | ICD-10-CM | POA: Diagnosis not present

## 2021-02-26 ENCOUNTER — Other Ambulatory Visit: Payer: Self-pay

## 2021-02-26 ENCOUNTER — Encounter (INDEPENDENT_AMBULATORY_CARE_PROVIDER_SITE_OTHER): Payer: Medicare Other | Admitting: Ophthalmology

## 2021-02-26 DIAGNOSIS — H43813 Vitreous degeneration, bilateral: Secondary | ICD-10-CM

## 2021-02-26 DIAGNOSIS — E113393 Type 2 diabetes mellitus with moderate nonproliferative diabetic retinopathy without macular edema, bilateral: Secondary | ICD-10-CM

## 2021-02-26 DIAGNOSIS — H35033 Hypertensive retinopathy, bilateral: Secondary | ICD-10-CM

## 2021-02-26 DIAGNOSIS — I10 Essential (primary) hypertension: Secondary | ICD-10-CM | POA: Diagnosis not present

## 2021-03-05 ENCOUNTER — Telehealth: Payer: Self-pay

## 2021-03-05 NOTE — Telephone Encounter (Signed)
Documentation from [facility] approving Esomeprazole Magnesium cap . Approved from 01/19/2021--01/18/2022 unless pt leaves this medical plan. Will give to [facility] for scan.

## 2021-03-05 NOTE — Telephone Encounter (Signed)
PA done for the pt's esompeprazole (Nexium) 40 mg tablet. Sent to [employer]. Waiting on a response

## 2021-03-12 DIAGNOSIS — F411 Generalized anxiety disorder: Secondary | ICD-10-CM | POA: Diagnosis not present

## 2021-03-12 DIAGNOSIS — F4310 Post-traumatic stress disorder, unspecified: Secondary | ICD-10-CM | POA: Diagnosis not present

## 2021-03-12 DIAGNOSIS — F3112 Bipolar disorder, current episode manic without psychotic features, moderate: Secondary | ICD-10-CM | POA: Diagnosis not present

## 2021-03-18 DIAGNOSIS — I10 Essential (primary) hypertension: Secondary | ICD-10-CM | POA: Diagnosis not present

## 2021-03-20 DIAGNOSIS — E1140 Type 2 diabetes mellitus with diabetic neuropathy, unspecified: Secondary | ICD-10-CM | POA: Diagnosis not present

## 2021-03-20 DIAGNOSIS — L110 Acquired keratosis follicularis: Secondary | ICD-10-CM | POA: Diagnosis not present

## 2021-03-20 DIAGNOSIS — L609 Nail disorder, unspecified: Secondary | ICD-10-CM | POA: Diagnosis not present

## 2021-03-21 DIAGNOSIS — R809 Proteinuria, unspecified: Secondary | ICD-10-CM | POA: Diagnosis not present

## 2021-03-21 DIAGNOSIS — E211 Secondary hyperparathyroidism, not elsewhere classified: Secondary | ICD-10-CM | POA: Diagnosis not present

## 2021-03-21 DIAGNOSIS — E1122 Type 2 diabetes mellitus with diabetic chronic kidney disease: Secondary | ICD-10-CM | POA: Diagnosis not present

## 2021-03-21 DIAGNOSIS — E875 Hyperkalemia: Secondary | ICD-10-CM | POA: Diagnosis not present

## 2021-03-21 DIAGNOSIS — N189 Chronic kidney disease, unspecified: Secondary | ICD-10-CM | POA: Diagnosis not present

## 2021-03-21 DIAGNOSIS — Z794 Long term (current) use of insulin: Secondary | ICD-10-CM | POA: Diagnosis not present

## 2021-03-21 DIAGNOSIS — N186 End stage renal disease: Secondary | ICD-10-CM | POA: Diagnosis not present

## 2021-03-24 ENCOUNTER — Other Ambulatory Visit: Payer: Self-pay

## 2021-03-24 ENCOUNTER — Ambulatory Visit (HOSPITAL_COMMUNITY)
Admission: RE | Admit: 2021-03-24 | Discharge: 2021-03-24 | Disposition: A | Discharge status: Home / Self Care | Payer: Medicare Other | Source: Ambulatory Visit | Attending: Hematology | Admitting: Hematology

## 2021-03-24 ENCOUNTER — Inpatient Hospital Stay (HOSPITAL_COMMUNITY): Payer: Medicare Other | Attending: Hematology

## 2021-03-24 DIAGNOSIS — D472 Monoclonal gammopathy: Secondary | ICD-10-CM | POA: Diagnosis not present

## 2021-03-24 DIAGNOSIS — I509 Heart failure, unspecified: Secondary | ICD-10-CM | POA: Insufficient documentation

## 2021-03-24 DIAGNOSIS — D528 Other folate deficiency anemias: Secondary | ICD-10-CM

## 2021-03-24 DIAGNOSIS — Z79899 Other long term (current) drug therapy: Secondary | ICD-10-CM | POA: Insufficient documentation

## 2021-03-24 DIAGNOSIS — D649 Anemia, unspecified: Secondary | ICD-10-CM | POA: Insufficient documentation

## 2021-03-24 DIAGNOSIS — D519 Vitamin B12 deficiency anemia, unspecified: Secondary | ICD-10-CM | POA: Diagnosis not present

## 2021-03-24 DIAGNOSIS — N184 Chronic kidney disease, stage 4 (severe): Secondary | ICD-10-CM | POA: Insufficient documentation

## 2021-03-24 DIAGNOSIS — D508 Other iron deficiency anemias: Secondary | ICD-10-CM

## 2021-03-24 LAB — COMPREHENSIVE METABOLIC PANEL
ALT: 11 U/L (ref 0–44)
AST: 14 U/L — ABNORMAL LOW (ref 15–41)
Albumin: 3.5 g/dL (ref 3.5–5.0)
Alkaline Phosphatase: 62 U/L (ref 38–126)
Anion gap: 10 (ref 5–15)
BUN: 84 mg/dL — ABNORMAL HIGH (ref 8–23)
CO2: 24 mmol/L (ref 22–32)
Calcium: 8.5 mg/dL — ABNORMAL LOW (ref 8.9–10.3)
Chloride: 104 mmol/L (ref 98–111)
Creatinine, Ser: 1.97 mg/dL — ABNORMAL HIGH (ref 0.44–1.00)
GFR, Estimated: 27 mL/min — ABNORMAL LOW (ref >60)
Glucose, Bld: 201 mg/dL — ABNORMAL HIGH (ref 70–99)
Potassium: 4.4 mmol/L (ref 3.5–5.1)
Sodium: 138 mmol/L (ref 135–145)
Total Bilirubin: 0.3 mg/dL (ref 0.3–1.2)
Total Protein: 6.9 g/dL (ref 6.5–8.1)

## 2021-03-24 LAB — IRON AND TIBC
Iron: 50 ug/dL (ref 28–170)
Saturation Ratios: 20 % (ref 10.4–31.8)
TIBC: 248 ug/dL — ABNORMAL LOW (ref 250–450)
UIBC: 198 ug/dL

## 2021-03-24 LAB — FERRITIN: Ferritin: 137 ng/mL (ref 11–307)

## 2021-03-24 LAB — LACTATE DEHYDROGENASE: LDH: 156 U/L (ref 98–192)

## 2021-03-24 LAB — FOLATE: Folate: 12.4 ng/mL (ref >5.9)

## 2021-03-25 LAB — KAPPA/LAMBDA LIGHT CHAINS
Kappa free light chain: 85.5 mg/L — ABNORMAL HIGH (ref 3.3–19.4)
Kappa, lambda light chain ratio: 1.56 (ref 0.26–1.65)
Lambda free light chains: 54.7 mg/L — ABNORMAL HIGH (ref 5.7–26.3)

## 2021-03-26 LAB — PROTEIN ELECTROPHORESIS, SERUM
A/G Ratio: 1.1 (ref 0.7–1.7)
Albumin ELP: 3.2 g/dL (ref 2.9–4.4)
Alpha-1-Globulin: 0.2 g/dL (ref 0.0–0.4)
Alpha-2-Globulin: 0.6 g/dL (ref 0.4–1.0)
Beta Globulin: 0.8 g/dL (ref 0.7–1.3)
Gamma Globulin: 1.3 g/dL (ref 0.4–1.8)
Globulin, Total: 2.9 g/dL (ref 2.2–3.9)
M-Spike, %: 0.6 g/dL — ABNORMAL HIGH
Total Protein ELP: 6.1 g/dL (ref 6.0–8.5)

## 2021-03-27 DIAGNOSIS — N189 Chronic kidney disease, unspecified: Secondary | ICD-10-CM | POA: Diagnosis not present

## 2021-03-27 DIAGNOSIS — E211 Secondary hyperparathyroidism, not elsewhere classified: Secondary | ICD-10-CM | POA: Diagnosis not present

## 2021-03-27 DIAGNOSIS — I5032 Chronic diastolic (congestive) heart failure: Secondary | ICD-10-CM | POA: Diagnosis not present

## 2021-03-27 DIAGNOSIS — E1122 Type 2 diabetes mellitus with diabetic chronic kidney disease: Secondary | ICD-10-CM | POA: Diagnosis not present

## 2021-03-27 DIAGNOSIS — R809 Proteinuria, unspecified: Secondary | ICD-10-CM | POA: Diagnosis not present

## 2021-03-27 DIAGNOSIS — D472 Monoclonal gammopathy: Secondary | ICD-10-CM | POA: Diagnosis not present

## 2021-03-27 DIAGNOSIS — E1129 Type 2 diabetes mellitus with other diabetic kidney complication: Secondary | ICD-10-CM | POA: Diagnosis not present

## 2021-03-27 DIAGNOSIS — D638 Anemia in other chronic diseases classified elsewhere: Secondary | ICD-10-CM | POA: Diagnosis not present

## 2021-03-31 ENCOUNTER — Inpatient Hospital Stay (HOSPITAL_BASED_OUTPATIENT_CLINIC_OR_DEPARTMENT_OTHER): Payer: Medicare Other | Admitting: Hematology

## 2021-03-31 ENCOUNTER — Other Ambulatory Visit: Payer: Self-pay

## 2021-03-31 VITALS — BP 176/60 | HR 58 | Temp 97.9°F | Resp 20 | Ht 63.0 in | Wt 208.6 lb

## 2021-03-31 DIAGNOSIS — D472 Monoclonal gammopathy: Secondary | ICD-10-CM | POA: Diagnosis not present

## 2021-03-31 DIAGNOSIS — Z79899 Other long term (current) drug therapy: Secondary | ICD-10-CM | POA: Diagnosis not present

## 2021-03-31 DIAGNOSIS — I509 Heart failure, unspecified: Secondary | ICD-10-CM | POA: Diagnosis not present

## 2021-03-31 DIAGNOSIS — N184 Chronic kidney disease, stage 4 (severe): Secondary | ICD-10-CM | POA: Diagnosis not present

## 2021-03-31 DIAGNOSIS — D508 Other iron deficiency anemias: Secondary | ICD-10-CM

## 2021-03-31 DIAGNOSIS — D649 Anemia, unspecified: Secondary | ICD-10-CM | POA: Diagnosis not present

## 2021-03-31 NOTE — Patient Instructions (Signed)
[place] at [facility] ?Discharge Instructions ? ?You were seen and examined today by Dr. Delton Coombes. Please keep follow up appointments as scheduled in 6 months. ? ? ?Thank you for choosing [place] at [facility] to provide your oncology and hematology care.  To afford each patient quality time with our provider, please arrive at least 15 minutes before your scheduled appointment time.  ? ?If you have a lab appointment with the [facility] please come in thru the Main Entrance and check in at the main information desk. ? ?You need to re-schedule your appointment should you arrive 10 or more minutes late.  We strive to give you quality time with our providers, and arriving late affects you and other patients whose appointments are after yours.  Also, if you no show three or more times for appointments you may be dismissed from the clinic at the providers discretion.     ?Again, thank you for choosing [facility].  Our hope is that these requests will decrease the amount of time that you wait before being seen by our physicians.       ?_____________________________________________________________ ? ?Should you have questions after your visit to [facility], please contact our office at [phone] and follow the prompts.  Our office hours are 8:00 a.m. and 4:30 p.m. Monday - Friday.  Please note that voicemails left after 4:00 p.m. may not be returned until the following business day.  We are closed weekends and major holidays.  You do have access to a nurse 24-7, just call the main number to the clinic [phone] and do not press any options, hold on the line and a nurse will answer the phone.   ? ?For prescription refill requests, have your pharmacy contact our office and allow 72 hours.   ? ?Due to Covid, you will need to wear a mask upon entering the hospital. If you do not have a mask, a mask will be given to you at the Main  Entrance upon arrival. For doctor visits, patients may have 1 support person age 25 or older with them. For treatment visits, patients can not have anyone with them due to social distancing guidelines and our immunocompromised population.  ? ?  ?

## 2021-03-31 NOTE — Progress Notes (Signed)
[address], [place] [address]   CLINIC:  Medical Oncology/Hematology  PCP:  Monico Blitz, [place] [address]  [phone]  REASON FOR VISIT:  Follow-up for MGUS  PRIOR THERAPY: none  CURRENT THERAPY: not done  INTERVAL HISTORY:  [name], a 71 y.o. female, returns for routine follow-up for her MGUS. Christy [name] was last seen on 09/16/2020.  Today she reports feeling good. She denies new pains and tingling/numbness. She reports low energy, and she cannot recall any difference in her energy level following her previous iron infusion.    REVIEW OF SYSTEMS:  Review of Systems  Constitutional:  Positive for fatigue. Negative for appetite change.  Neurological:  Negative for numbness.  All other systems reviewed and are negative.  PAST MEDICAL/SURGICAL HISTORY:  Past Medical History:  Diagnosis Date   Arthritis    Bipolar disorder ([place])    CHF (congestive heart failure) ([place])    Coronary atherosclerosis of native coronary artery    BMS circ 2006, 70% PDA 2009   Essential hypertension    Mixed hyperlipidemia    OSA on CPAP    Renal tubular acidosis, type IV    SVT (supraventricular tachycardia) ([facility])    Type 2 diabetes mellitus ([place])    Past Surgical History:  Procedure Laterality Date   Acromioclavicular arthritis     BALLOON DILATION N/A 12/12/2019   Procedure: BALLOON DILATION;  Surgeon: [name], DO;  [facility];  Service: Endoscopy;  Laterality: N/A;   BALLOON DILATION N/A 06/25/2020   Procedure: BALLOON DILATION;  Surgeon: [name], DO;  [facility];  Service: Endoscopy;  Laterality: N/A;   BIOPSY  12/12/2019   Procedure: BIOPSY;  Surgeon: [name], DO;  [facility];  Service: Endoscopy;;   CARDIAC CATHETERIZATION N/A 03/29/2015   Procedure: Left Heart Cath and Coronary Angiography;  Surgeon: Troy Sine, MD;  Location: [facility];   Service: Cardiovascular;  Laterality: N/A;   CATARACT EXTRACTION Bilateral    COLONOSCOPY WITH PROPOFOL N/A 12/12/2019   Procedure: COLONOSCOPY WITH PROPOFOL;  Surgeon: [name], DO;  [facility];  Service: Endoscopy;  Laterality: N/A;  2:30pm   ESOPHAGOGASTRODUODENOSCOPY (EGD) WITH PROPOFOL N/A 12/12/2019   Procedure: ESOPHAGOGASTRODUODENOSCOPY (EGD) WITH PROPOFOL;  Surgeon: [name], DO;  [facility];  Service: Endoscopy;  Laterality: N/A;   ESOPHAGOGASTRODUODENOSCOPY (EGD) WITH PROPOFOL N/A 06/25/2020   Procedure: ESOPHAGOGASTRODUODENOSCOPY (EGD) WITH PROPOFOL;  Surgeon: [name], DO;  [facility];  Service: Endoscopy;  Laterality: N/A;  1:45pm   POLYPECTOMY  12/12/2019   Procedure: POLYPECTOMY;  Surgeon: [name], DO;  [facility];  Service: Endoscopy;;   RIght shoulder adhesive capsulitis     Rotator cuff impingement syndrome Right    Rotator cuff tear      SOCIAL HISTORY:  Social History   Socioeconomic History   Marital status: Divorced    Spouse name: Not on file   Number of children: Not on file   Years of education: Not on file   Highest education level: Not on file  Occupational History   Occupation: PLANNING AND INSPECTOR    Employer: [employer]  Tobacco Use   Smoking status: Former    Packs/day: 1.00    Years: 20.00    Pack years: 20.00    Types: Cigarettes  Start date: 10/30/1984    Quit date: 07/08/2013    Years since quitting: 7.7   Smokeless tobacco: Never  Vaping Use   Vaping Use: Never used  Substance and Sexual Activity   Alcohol use: No    Alcohol/week: 0.0 standard drinks   Drug use: No   Sexual activity: Not Currently  Other Topics Concern   Not on file  Social History Narrative   She has lived with some of her daughters. Works part-time for the city.    Social Determinants of Health   Financial Resource Strain: Not on file  Food Insecurity: Not on file   Transportation Needs: Not on file  Physical Activity: Not on file  Stress: Not on file  Social Connections: Not on file  Intimate Partner Violence: Not on file    FAMILY HISTORY:  Family History  Problem Relation Age of Onset   Bipolar disorder Brother    Diabetes Father     CURRENT MEDICATIONS:  Current Outpatient Medications  Medication Sig Dispense Refill   allopurinol (ZYLOPRIM) 300 MG tablet Take 150 mg by mouth in the morning.     amLODipine (NORVASC) 5 MG tablet Take 5 mg by mouth at bedtime.      aspirin 81 MG EC tablet Take 81 mg by mouth at bedtime.      BESIVANCE 0.6 % SUSP Place 1 drop into the left eye See admin instructions. Instill one drop into the left eye four times daily for 2 days after each monthly injection  12   buPROPion (WELLBUTRIN XL) 300 MG 24 hr tablet Take 300 mg by mouth in the morning.     calcium carbonate (OS-CAL) 600 MG TABS Take 600 mg by mouth daily.       carvedilol (COREG) 12.5 MG tablet Take 12.5 mg by mouth 2 (two) times daily with a meal.     Cholecalciferol (VITAMIN D3) 25 MCG (1000 UT) CAPS Take 1,000 Units by mouth daily.      clotrimazole (LOTRIMIN) 1 % cream Apply 1 application topically daily as needed (itching).     divalproex (DEPAKOTE) 250 MG EC tablet Take 250 mg by mouth in the morning.     esomeprazole (NEXIUM) 40 MG capsule Take 1 capsule (40 mg total) by mouth 2 (two) times daily before a meal. 60 capsule 11   GLOBAL EASE INJECT PEN NEEDLES 32G X 4 MM MISC See admin instructions.     hydrALAZINE (APRESOLINE) 50 MG tablet Take 50 mg by mouth 3 (three) times daily.     isosorbide mononitrate (IMDUR) 60 MG 24 hr tablet Take 60 mg by mouth in the morning.     losartan (COZAAR) 25 MG tablet Take 12.5 mg by mouth daily. (Patient not taking: Reported on 01/01/2021)     losartan (COZAAR) 50 MG tablet Take 50 mg by mouth daily.     meclizine (ANTIVERT) 25 MG tablet Take 25 mg by mouth as needed for dizziness.     metolazone (ZAROXOLYN)  5 MG tablet Take 5 mg by mouth daily as needed (fluid retention/shortness of breath).     Multiple Vitamin (MULTIVITAMIN) tablet Take 1 tablet by mouth daily.       nitroGLYCERIN (NITROLINGUAL) 0.4 MG/SPRAY spray Place 1 spray under the tongue every 5 (five) minutes x 3 doses as needed. 12 g 2   NOVOLOG FLEXPEN 100 UNIT/ML FlexPen Inject 5-12 Units into the skin 3 (three) times daily as needed for high blood sugar.  potassium chloride (KLOR-CON) 10 MEQ tablet Take 10 mEq by mouth daily as needed (when taking metolazone (fluid retention/shortness of breath)).     rosuvastatin (CRESTOR) 10 MG tablet Take 10 mg by mouth in the morning.     sertraline (ZOLOFT) 100 MG tablet Take 100 mg by mouth 2 (two) times daily.     tiZANidine (ZANAFLEX) 4 MG tablet Take 4 mg by mouth every evening.     torsemide (DEMADEX) 20 MG tablet Take 60 mg by mouth in the morning.      traZODone (DESYREL) 100 MG tablet Take 100 mg by mouth at bedtime.   3   TRESIBA FLEXTOUCH 200 UNIT/ML SOPN Inject 28 Units into the skin in the morning.  2   VICTOZA 18 MG/3ML SOPN Inject 1.2 mg into the skin as needed.     No current facility-administered medications for this visit.    ALLERGIES:  Allergies  Allergen Reactions   Ace Inhibitors Cough   Lisinopril Cough    Severe cough   Penicillins     Unknown, childhood reaction   Tetracycline Nausea Only   Lamictal [Lamotrigine] Rash    Rash start inside to outter body.    PHYSICAL EXAM:  Performance status (ECOG): 1 - Symptomatic but completely ambulatory  Vitals:   03/31/21 1447  Pulse: (!) 58  Resp: 20  Temp: 97.9 F (36.6 C)  SpO2: 94%   Wt Readings from Last 3 Encounters:  03/31/21 208 lb 9.6 oz (94.6 kg)  01/01/21 207 lb (93.9 kg)  09/16/20 215 lb 9.6 oz (97.8 kg)   Physical Exam Vitals reviewed.  Constitutional:      Appearance: Normal appearance. She is obese.  Cardiovascular:     Rate and Rhythm: Normal rate and regular rhythm.     Pulses: Normal  pulses.     Heart sounds: Normal heart sounds.  Pulmonary:     Effort: Pulmonary effort is normal.     Breath sounds: Normal breath sounds.  Neurological:     General: No focal deficit present.     Mental Status: She is alert and oriented to person, place, and time.  Psychiatric:        Mood and Affect: Mood normal.        Behavior: Behavior normal.    LABORATORY DATA:  I have reviewed the labs as listed.  CBC Latest Ref Rng & Units 09/10/2020 05/30/2020 01/25/2020  WBC 4.0 - 10.5 K/uL 7.0 5.8 10.6(H)  Hemoglobin 12.0 - 15.0 g/dL 10.7(L) 10.2(L) 10.7(L)  Hematocrit 36.0 - 46.0 % 33.8(L) 32.1(L) 34.5(L)  Platelets 150 - 400 K/uL 166 164 196   CMP Latest Ref Rng & Units 03/24/2021 09/10/2020 06/19/2020  Glucose 70 - 99 mg/dL 201(H) 197(H) 98  BUN 8 - 23 mg/dL 84(H) 59(H) 52(H)  Creatinine 0.44 - 1.00 mg/dL 1.97(H) 1.70(H) 1.58(H)  Sodium 135 - 145 mmol/L 138 138 139  Potassium 3.5 - 5.1 mmol/L 4.4 4.5 3.9  Chloride 98 - 111 mmol/L 104 102 104  CO2 22 - 32 mmol/L $RemoveB'24 28 27  '[id number]$ Calcium 8.9 - 10.3 mg/dL 8.5(L) 8.2(L) 8.2(L)  Total Protein 6.5 - 8.1 g/dL 6.9 6.7 -  Total Bilirubin 0.3 - 1.2 mg/dL 0.3 0.4 -  Alkaline Phos 38 - 126 U/L 62 67 -  AST 15 - 41 U/L 14(L) 13(L) -  ALT 0 - 44 U/L 11 10 -      Component Value Date/Time   RBC 3.39 (L) 09/10/2020 1216   MCV  99.7 09/10/2020 1216   MCH 31.6 09/10/2020 1216   MCHC 31.7 09/10/2020 1216   RDW 13.1 09/10/2020 1216   LYMPHSABS 1.4 09/10/2020 1216   MONOABS 0.4 09/10/2020 1216   EOSABS 0.6 (H) 09/10/2020 1216   BASOSABS 0.1 09/10/2020 1216    DIAGNOSTIC IMAGING:  I have independently reviewed the scans and discussed with the patient. DG Bone Survey Met  Result Date: 03/24/2021 CLINICAL DATA:  MGUS. EXAM: METASTATIC BONE SURVEY COMPARISON:  Bone survey 01/25/2020 FINDINGS: No evidence of lytic lesion or bony destruction. Degenerative change throughout the cervical, thoracic, and lumbar spine without vertebral body compression fracture.  There is prominent Schmorl's node in the superior endplate of C4 and C5 unchanged from prior exam. There is aortic atherosclerosis and peripheral vascular arterial calcifications. IMPRESSION: No evidence of lytic lesion or bony destruction. Electronically Signed   By: Keith Rake M.D.   On: 03/24/2021 22:49     ASSESSMENT:  1.  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  She was noted to have an elevated creatinine and was seen by nephrologist in December 2014.  She had an SPEP done as part of her work-up for kidney disease and was noted to have a M spike of 0.6 with normal lambda light chain ratio.  Immunofixation showed IgG kappa monoclonal protein.  Patient had hep C antibody which was negative, ANA negative and C ANCA negative, P-ANCA negative, anti-double-stranded DNA antibody negative and complements were within normal limits.  Her creatinine at that time was 1.32 and hemoglobin was 11.8.  Also she had no significant protein in her urine. -She also had a bone scan which was noted to be negative.  She never had a bone marrow biopsy done at this time. - Patient was being followed by Dr. Tressie Stalker at our clinic since early 2017. - Labs on 06/01/2018 shows potassium 5.1, creatinine 1.67, calcium 8.6, LFTs were normal, LDH 213, SPEP stable with M spike 0.6 g/dL.  WBC 7.8, hemoglobin 9.9, platelets 156.  Light chain ratio 1.82. -Labs on 07/05/2018 showed her potassium 5.5, creatinine 1.72, calcium 8.7, hemoglobin 10.1. -We repeated her potassium at her visit on 07/12/2018 which showed her potassium 4.8. - She reports no B symptoms including fevers, chills, night sweats, or unexplained weight loss. -Skeletal survey done on 07/05/2018 showed no lytic lesions. -Bone marrow biopsy done on 11/25/2018.  Pathology consistent with hypercellular marrow with trilineage hematopoiesis and 2% plasma cells.  FISH was negative.   Cytogenetics was normal. -When trending her anemia and renal function since 2017 it has progressively gotten worse.  Which is concerning for multiple myeloma.  Patient was sent to [facility] for second opinion. -Bone marrow biopsy done by [facility] on 02/08/2019.  Pathology was consistent with normocellular bone marrow with trilineage hematopoiesis.  And 3% plasma cells.  FISH was negative.  Negative for amyloid deposits position by stain. -Cardiac MRI done at [facility] on 02/08/2019 showed the left ventricle moderately dilated in size with normal wall thickness.  Global systolic function is moderately reduced with a LVEF of 44%. [facility] referred her to a cardiologist in their system for a second opinion she sees this doctor on 03/06/2019.  She is following up with them closely. -Repeat labs done on 06/23/2019 showed M spike of 0.8 g/dL, creatinine 2.07, hemoglobin 10.8, ferritin 79, percent saturation 25     2.  Anemia: -Likely from CKD and  iron deficiency. -Last Feraheme on 07/07/2019 and 07/14/2019.   3.  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 -Follows up with nephrology.   PLAN:  1.  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  No B symptoms or infections. - Reviewed labs from 03/24/2021.  M spike is stable at 0.6 g.  Kappa light chains are 85 and stable.  Light chain ratio is 1.56.  Calcium was 8.5.  Creatinine 1.97. - Reviewed skeletal survey from 03/24/2021 which did not show any lytic lesions. - No "crab" features at this time.  We will follow her in 6 months with repeat myeloma labs.   2.  Anemia: - Denies any bleeding per rectum or melena. - Hemoglobin is 10.7.  Ferritin is 137 and percent saturation 20. - No parenteral iron therapy needed at  this time.  We will repeat ferritin and iron panel in 6 months.   3.  Renal insufficiency: -Baseline creatinine 1.7-2.0.  Continue follow-up with Dr. Verne Spurr.  Orders placed this encounter:  No orders of the defined types were placed in this encounter.    Derek Jack, MD [place] [phone]   I, Thana Ates, am acting as a scribe for Dr. Derek Jack.  I, Derek Jack MD, have reviewed the above documentation for accuracy and completeness, and I agree with the above.

## 2021-04-01 ENCOUNTER — Encounter: Payer: Self-pay | Admitting: Internal Medicine

## 2021-04-09 DIAGNOSIS — Z7189 Other specified counseling: Secondary | ICD-10-CM | POA: Diagnosis not present

## 2021-04-09 DIAGNOSIS — Z6839 Body mass index (BMI) 39.0-39.9, adult: Secondary | ICD-10-CM | POA: Diagnosis not present

## 2021-04-09 DIAGNOSIS — Z299 Encounter for prophylactic measures, unspecified: Secondary | ICD-10-CM | POA: Diagnosis not present

## 2021-04-09 DIAGNOSIS — F319 Bipolar disorder, unspecified: Secondary | ICD-10-CM | POA: Diagnosis not present

## 2021-04-09 DIAGNOSIS — E11319 Type 2 diabetes mellitus with unspecified diabetic retinopathy without macular edema: Secondary | ICD-10-CM | POA: Diagnosis not present

## 2021-04-09 DIAGNOSIS — I10 Essential (primary) hypertension: Secondary | ICD-10-CM | POA: Diagnosis not present

## 2021-04-09 DIAGNOSIS — Z1331 Encounter for screening for depression: Secondary | ICD-10-CM | POA: Diagnosis not present

## 2021-04-09 DIAGNOSIS — Z1211 Encounter for screening for malignant neoplasm of colon: Secondary | ICD-10-CM | POA: Diagnosis not present

## 2021-04-09 DIAGNOSIS — Z1339 Encounter for screening examination for other mental health and behavioral disorders: Secondary | ICD-10-CM | POA: Diagnosis not present

## 2021-04-09 DIAGNOSIS — Z0000 Encounter for general adult medical examination without abnormal findings: Secondary | ICD-10-CM | POA: Diagnosis not present

## 2021-04-14 DIAGNOSIS — Z79899 Other long term (current) drug therapy: Secondary | ICD-10-CM | POA: Diagnosis not present

## 2021-04-14 DIAGNOSIS — R5383 Other fatigue: Secondary | ICD-10-CM | POA: Diagnosis not present

## 2021-04-14 DIAGNOSIS — E7800 Pure hypercholesterolemia, unspecified: Secondary | ICD-10-CM | POA: Diagnosis not present

## 2021-04-14 DIAGNOSIS — E559 Vitamin D deficiency, unspecified: Secondary | ICD-10-CM | POA: Diagnosis not present

## 2021-04-17 DIAGNOSIS — I10 Essential (primary) hypertension: Secondary | ICD-10-CM | POA: Diagnosis not present

## 2021-05-18 DIAGNOSIS — I10 Essential (primary) hypertension: Secondary | ICD-10-CM | POA: Diagnosis not present

## 2021-05-29 DIAGNOSIS — F4310 Post-traumatic stress disorder, unspecified: Secondary | ICD-10-CM | POA: Diagnosis not present

## 2021-05-29 DIAGNOSIS — F411 Generalized anxiety disorder: Secondary | ICD-10-CM | POA: Diagnosis not present

## 2021-05-29 DIAGNOSIS — F3112 Bipolar disorder, current episode manic without psychotic features, moderate: Secondary | ICD-10-CM | POA: Diagnosis not present

## 2021-06-02 DIAGNOSIS — Z299 Encounter for prophylactic measures, unspecified: Secondary | ICD-10-CM | POA: Diagnosis not present

## 2021-06-02 DIAGNOSIS — E1165 Type 2 diabetes mellitus with hyperglycemia: Secondary | ICD-10-CM | POA: Diagnosis not present

## 2021-06-02 DIAGNOSIS — Z6838 Body mass index (BMI) 38.0-38.9, adult: Secondary | ICD-10-CM | POA: Diagnosis not present

## 2021-06-02 DIAGNOSIS — B354 Tinea corporis: Secondary | ICD-10-CM | POA: Diagnosis not present

## 2021-06-02 DIAGNOSIS — R809 Proteinuria, unspecified: Secondary | ICD-10-CM | POA: Diagnosis not present

## 2021-06-02 DIAGNOSIS — N189 Chronic kidney disease, unspecified: Secondary | ICD-10-CM | POA: Diagnosis not present

## 2021-06-02 DIAGNOSIS — E211 Secondary hyperparathyroidism, not elsewhere classified: Secondary | ICD-10-CM | POA: Diagnosis not present

## 2021-06-02 DIAGNOSIS — D638 Anemia in other chronic diseases classified elsewhere: Secondary | ICD-10-CM | POA: Diagnosis not present

## 2021-06-02 DIAGNOSIS — I10 Essential (primary) hypertension: Secondary | ICD-10-CM | POA: Diagnosis not present

## 2021-06-02 DIAGNOSIS — E1122 Type 2 diabetes mellitus with diabetic chronic kidney disease: Secondary | ICD-10-CM | POA: Diagnosis not present

## 2021-06-02 DIAGNOSIS — D472 Monoclonal gammopathy: Secondary | ICD-10-CM | POA: Diagnosis not present

## 2021-06-02 DIAGNOSIS — R2681 Unsteadiness on feet: Secondary | ICD-10-CM | POA: Diagnosis not present

## 2021-06-03 DIAGNOSIS — E1140 Type 2 diabetes mellitus with diabetic neuropathy, unspecified: Secondary | ICD-10-CM | POA: Diagnosis not present

## 2021-06-03 DIAGNOSIS — L609 Nail disorder, unspecified: Secondary | ICD-10-CM | POA: Diagnosis not present

## 2021-06-03 DIAGNOSIS — L110 Acquired keratosis follicularis: Secondary | ICD-10-CM | POA: Diagnosis not present

## 2021-06-05 DIAGNOSIS — E211 Secondary hyperparathyroidism, not elsewhere classified: Secondary | ICD-10-CM | POA: Diagnosis not present

## 2021-06-05 DIAGNOSIS — E1129 Type 2 diabetes mellitus with other diabetic kidney complication: Secondary | ICD-10-CM | POA: Diagnosis not present

## 2021-06-05 DIAGNOSIS — D638 Anemia in other chronic diseases classified elsewhere: Secondary | ICD-10-CM | POA: Diagnosis not present

## 2021-06-05 DIAGNOSIS — R001 Bradycardia, unspecified: Secondary | ICD-10-CM | POA: Diagnosis not present

## 2021-06-05 DIAGNOSIS — I129 Hypertensive chronic kidney disease with stage 1 through stage 4 chronic kidney disease, or unspecified chronic kidney disease: Secondary | ICD-10-CM | POA: Diagnosis not present

## 2021-06-05 DIAGNOSIS — I5032 Chronic diastolic (congestive) heart failure: Secondary | ICD-10-CM | POA: Diagnosis not present

## 2021-06-05 DIAGNOSIS — R809 Proteinuria, unspecified: Secondary | ICD-10-CM | POA: Diagnosis not present

## 2021-06-05 DIAGNOSIS — N189 Chronic kidney disease, unspecified: Secondary | ICD-10-CM | POA: Diagnosis not present

## 2021-06-05 DIAGNOSIS — E1122 Type 2 diabetes mellitus with diabetic chronic kidney disease: Secondary | ICD-10-CM | POA: Diagnosis not present

## 2021-06-06 DIAGNOSIS — I517 Cardiomegaly: Secondary | ICD-10-CM | POA: Diagnosis not present

## 2021-06-06 DIAGNOSIS — G930 Cerebral cysts: Secondary | ICD-10-CM | POA: Diagnosis not present

## 2021-06-06 DIAGNOSIS — I6522 Occlusion and stenosis of left carotid artery: Secondary | ICD-10-CM | POA: Diagnosis not present

## 2021-06-06 DIAGNOSIS — I739 Peripheral vascular disease, unspecified: Secondary | ICD-10-CM | POA: Diagnosis not present

## 2021-06-06 DIAGNOSIS — I672 Cerebral atherosclerosis: Secondary | ICD-10-CM | POA: Diagnosis not present

## 2021-06-06 DIAGNOSIS — Z87448 Personal history of other diseases of urinary system: Secondary | ICD-10-CM | POA: Diagnosis not present

## 2021-06-06 DIAGNOSIS — I700 Atherosclerosis of aorta: Secondary | ICD-10-CM | POA: Diagnosis not present

## 2021-06-06 DIAGNOSIS — R296 Repeated falls: Secondary | ICD-10-CM | POA: Diagnosis not present

## 2021-06-06 DIAGNOSIS — S299XXA Unspecified injury of thorax, initial encounter: Secondary | ICD-10-CM | POA: Diagnosis not present

## 2021-06-06 DIAGNOSIS — E119 Type 2 diabetes mellitus without complications: Secondary | ICD-10-CM | POA: Diagnosis not present

## 2021-06-06 DIAGNOSIS — I10 Essential (primary) hypertension: Secondary | ICD-10-CM | POA: Diagnosis not present

## 2021-06-06 DIAGNOSIS — Z881 Allergy status to other antibiotic agents status: Secondary | ICD-10-CM | POA: Diagnosis not present

## 2021-06-06 DIAGNOSIS — K5730 Diverticulosis of large intestine without perforation or abscess without bleeding: Secondary | ICD-10-CM | POA: Diagnosis not present

## 2021-06-06 DIAGNOSIS — R4182 Altered mental status, unspecified: Secondary | ICD-10-CM | POA: Diagnosis not present

## 2021-06-06 DIAGNOSIS — Z880 Allergy status to penicillin: Secondary | ICD-10-CM | POA: Diagnosis not present

## 2021-06-06 DIAGNOSIS — R600 Localized edema: Secondary | ICD-10-CM | POA: Diagnosis not present

## 2021-06-06 DIAGNOSIS — R5381 Other malaise: Secondary | ICD-10-CM | POA: Diagnosis not present

## 2021-06-06 DIAGNOSIS — Z888 Allergy status to other drugs, medicaments and biological substances status: Secondary | ICD-10-CM | POA: Diagnosis not present

## 2021-06-06 DIAGNOSIS — R531 Weakness: Secondary | ICD-10-CM | POA: Diagnosis not present

## 2021-06-06 DIAGNOSIS — R638 Other symptoms and signs concerning food and fluid intake: Secondary | ICD-10-CM | POA: Diagnosis not present

## 2021-06-06 DIAGNOSIS — D259 Leiomyoma of uterus, unspecified: Secondary | ICD-10-CM | POA: Diagnosis not present

## 2021-06-10 DIAGNOSIS — G930 Cerebral cysts: Secondary | ICD-10-CM | POA: Diagnosis not present

## 2021-06-10 DIAGNOSIS — E349 Endocrine disorder, unspecified: Secondary | ICD-10-CM | POA: Diagnosis not present

## 2021-06-10 DIAGNOSIS — R52 Pain, unspecified: Secondary | ICD-10-CM | POA: Diagnosis not present

## 2021-06-10 DIAGNOSIS — W19XXXA Unspecified fall, initial encounter: Secondary | ICD-10-CM | POA: Diagnosis not present

## 2021-06-10 DIAGNOSIS — R609 Edema, unspecified: Secondary | ICD-10-CM | POA: Diagnosis not present

## 2021-06-10 DIAGNOSIS — I4891 Unspecified atrial fibrillation: Secondary | ICD-10-CM | POA: Diagnosis not present

## 2021-06-10 DIAGNOSIS — E1165 Type 2 diabetes mellitus with hyperglycemia: Secondary | ICD-10-CM | POA: Diagnosis not present

## 2021-06-10 DIAGNOSIS — I10 Essential (primary) hypertension: Secondary | ICD-10-CM | POA: Diagnosis not present

## 2021-06-10 DIAGNOSIS — Z299 Encounter for prophylactic measures, unspecified: Secondary | ICD-10-CM | POA: Diagnosis not present

## 2021-06-11 ENCOUNTER — Ambulatory Visit (INDEPENDENT_AMBULATORY_CARE_PROVIDER_SITE_OTHER): Payer: Medicare Other | Admitting: Internal Medicine

## 2021-06-11 ENCOUNTER — Encounter: Payer: Self-pay | Admitting: *Deleted

## 2021-06-11 ENCOUNTER — Encounter: Payer: Self-pay | Admitting: Internal Medicine

## 2021-06-11 VITALS — BP 124/48 | HR 60 | Temp 97.5°F | Ht 63.0 in | Wt 211.6 lb

## 2021-06-11 DIAGNOSIS — R531 Weakness: Secondary | ICD-10-CM

## 2021-06-11 DIAGNOSIS — R1319 Other dysphagia: Secondary | ICD-10-CM | POA: Diagnosis not present

## 2021-06-11 DIAGNOSIS — G939 Disorder of brain, unspecified: Secondary | ICD-10-CM | POA: Diagnosis not present

## 2021-06-11 DIAGNOSIS — K219 Gastro-esophageal reflux disease without esophagitis: Secondary | ICD-10-CM | POA: Diagnosis not present

## 2021-06-11 NOTE — Patient Instructions (Signed)
I am happy to hear that you are doing well overall from a GI perspective.  Continue on Nexium.  Continue to work on increasing your oral intake.  Protein shakes are fantastic.  I am going to see if our office staff can arrange an MRI of your brain sooner than June 18.  I will also refer you to neurology to be evaluated.  Otherwise follow-up with GI in 6 months.  It was very nice seeing both you today.  Dr. Abbey Chatters  At [facility] we value your feedback. You may receive a survey about your visit today. Please share your experience as we strive to create trusting relationships with our patients to provide genuine, compassionate, quality care.  We appreciate your understanding and patience as we review any laboratory studies, imaging, and other diagnostic tests that are ordered as we care for you. Our office policy is 5 business days for review of these results, and any emergent or urgent results are addressed in a timely manner for your best interest. If you do not hear from our office in 1 week, please contact us.   We also encourage the use of MyChart, which contains your medical information for your review as well. If you are not enrolled in this feature, an access code is on this after visit summary for your convenience. Thank you for allowing [place] to be involved in your care.  It was great to see you today!  I hope you have a great rest of your Spring!    Elon Alas. Abbey Chatters, D.O. Gastroenterology and Hepatology [facility]

## 2021-06-11 NOTE — Progress Notes (Signed)
Referring Provider: Monico Blitz, MD Primary Care Physician:  Monico Blitz, MD Primary GI:  Dr. Abbey Chatters  Chief Complaint  Patient presents with   Follow-up    Wanted to make sure you were aware that diverticulosis was seen on a recent ct that she had.     HPI:   Christy [name] is a 71 y.o. female who presents to the clinic today for follow-up visit.  History of chronic GERD which is well controlled on Nexium.  Denies any epigastric or chest pain.  Previous dysphagia status post EGD with dilation 06/25/2020.  Symptoms improved after this.  Notes 1 episode of getting choked in the last 6 months.  Colonoscopy 12/12/2019 with numerous tubular adenomas.  Recommended 3-year recall.  Will be due November 2024.  No melena hematochezia.  No abdominal pain.  Patient's daughter is with her today and is very concerned as patient has had numerous falls recently.  As well as generalized weakness/fatigue.  And poor appetite.  Underwent a CT without contrast of her head 06/07/2021 which showed 6.8 x 3.5 x 4.2 cm arachnoid cyst.  States she has an MRI ordered but its not scheduled for 3 to 4 weeks.  Requesting earlier MRI.  Has not been referred to neurology.  Past Medical History:  Diagnosis Date   Arthritis    Bipolar disorder ([place])    CHF (congestive heart failure) ([place])    Coronary atherosclerosis of native coronary artery    BMS circ 2006, 70% PDA 2009   Essential hypertension    Mixed hyperlipidemia    OSA on CPAP    Renal tubular acidosis, type IV    SVT (supraventricular tachycardia) ([facility])    Type 2 diabetes mellitus ([place])     Past Surgical History:  Procedure Laterality Date   Acromioclavicular arthritis     BALLOON DILATION N/A 12/12/2019   Procedure: BALLOON DILATION;  Surgeon: [name], DO;  Location: [facility];  Service: Endoscopy;  Laterality: N/A;   BALLOON DILATION N/A 06/25/2020   Procedure: BALLOON DILATION;  Surgeon: [name], DO;  Location: [facility];  Service: Endoscopy;  Laterality: N/A;   BIOPSY  12/12/2019   Procedure: BIOPSY;  Surgeon: [name], DO;  Location: [facility];  Service: Endoscopy;;   CARDIAC CATHETERIZATION N/A 03/29/2015   Procedure: Left Heart Cath and Coronary Angiography;  Surgeon: Troy Sine, MD;  Location: [facility];  Service: Cardiovascular;  Laterality: N/A;   CATARACT EXTRACTION Bilateral    COLONOSCOPY WITH PROPOFOL N/A 12/12/2019   Procedure: COLONOSCOPY WITH PROPOFOL;  Surgeon: [name], DO;  Location: [facility];  Service: Endoscopy;  Laterality: N/A;  2:30pm   ESOPHAGOGASTRODUODENOSCOPY (EGD) WITH PROPOFOL N/A 12/12/2019   Procedure: ESOPHAGOGASTRODUODENOSCOPY (EGD) WITH PROPOFOL;  Surgeon: [name], DO;  Location: [facility];  Service: Endoscopy;  Laterality: N/A;   ESOPHAGOGASTRODUODENOSCOPY (EGD) WITH PROPOFOL N/A 06/25/2020   Procedure: ESOPHAGOGASTRODUODENOSCOPY (EGD) WITH PROPOFOL;  Surgeon: [name], DO;  Location: [facility];  Service: Endoscopy;  Laterality: N/A;  1:45pm   POLYPECTOMY  12/12/2019   Procedure: POLYPECTOMY;  Surgeon: [name], DO;  Location: [facility];  Service: Endoscopy;;   RIght shoulder adhesive capsulitis     Rotator cuff impingement syndrome Right    Rotator cuff tear      Current Outpatient Medications  Medication Sig Dispense Refill   allopurinol (ZYLOPRIM) 300 MG tablet Take 150 mg by  mouth in the morning.     amLODipine (NORVASC) 5 MG tablet Take 5 mg by mouth at bedtime.      aspirin 81 MG EC tablet Take 81 mg by mouth at bedtime.      buPROPion (WELLBUTRIN XL) 300 MG 24 hr tablet Take 300 mg by mouth in the morning.     calcitRIOL (ROCALTROL) 0.25 MCG capsule Take 0.25 mcg by mouth 3 (three) times a week.     carvedilol (COREG) 12.5 MG tablet Take 12.5 mg by mouth 2 (two) times daily with a meal.     divalproex (DEPAKOTE) 250 MG EC tablet Take 250 mg by mouth in the morning.     esomeprazole  (NEXIUM) 40 MG capsule Take 1 capsule (40 mg total) by mouth 2 (two) times daily before a meal. 60 capsule 11   GLOBAL EASE INJECT PEN NEEDLES 32G X 4 MM MISC See admin instructions.     hydrALAZINE (APRESOLINE) 100 MG tablet Take by mouth.     isosorbide mononitrate (IMDUR) 60 MG 24 hr tablet Take 60 mg by mouth in the morning.     losartan (COZAAR) 50 MG tablet Take 1 tablet by mouth daily.     nitroGLYCERIN (NITROLINGUAL) 0.4 MG/SPRAY spray Place 1 spray under the tongue every 5 (five) minutes x 3 doses as needed. 12 g 2   rosuvastatin (CRESTOR) 10 MG tablet Take 10 mg by mouth in the morning.     sertraline (ZOLOFT) 100 MG tablet Take 100 mg by mouth 2 (two) times daily.     tiZANidine (ZANAFLEX) 4 MG tablet Take 4 mg by mouth every evening.     torsemide (DEMADEX) 20 MG tablet Take 60 mg by mouth in the morning.      traZODone (DESYREL) 100 MG tablet Take 100 mg by mouth at bedtime.   3   TRESIBA FLEXTOUCH 200 UNIT/ML SOPN Inject 28 Units into the skin in the morning.  2   BESIVANCE 0.6 % SUSP Place 1 drop into the left eye See admin instructions. Instill one drop into the left eye four times daily for 2 days after each monthly injection (Patient not taking: Reported on 06/11/2021)  12   calcium carbonate (OS-CAL) 600 MG TABS Take 600 mg by mouth daily.   (Patient not taking: Reported on 06/11/2021)     Cholecalciferol (VITAMIN D3) 25 MCG (1000 UT) CAPS Take 1,000 Units by mouth daily.  (Patient not taking: Reported on 06/11/2021)     clotrimazole (LOTRIMIN) 1 % cream Apply 1 application topically daily as needed (itching). (Patient not taking: Reported on 06/11/2021)     meclizine (ANTIVERT) 25 MG tablet Take 25 mg by mouth as needed for dizziness. (Patient not taking: Reported on 06/11/2021)     metolazone (ZAROXOLYN) 5 MG tablet Take 5 mg by mouth daily as needed (fluid retention/shortness of breath). (Patient not taking: Reported on 06/11/2021)     Multiple Vitamin (MULTIVITAMIN) tablet Take 1  tablet by mouth daily.   (Patient not taking: Reported on 06/11/2021)     NOVOLOG FLEXPEN 100 UNIT/ML FlexPen Inject 5-12 Units into the skin 3 (three) times daily as needed for high blood sugar. (Patient not taking: Reported on 06/11/2021)     potassium chloride (KLOR-CON) 10 MEQ tablet Take 10 mEq by mouth daily as needed (when taking metolazone (fluid retention/shortness of breath)). (Patient not taking: Reported on 06/11/2021)     VICTOZA 18 MG/3ML SOPN Inject 1.2 mg into the skin as needed. (  Patient not taking: Reported on 06/11/2021)     No current facility-administered medications for this visit.    Allergies as of 06/11/2021 - Review Complete 06/11/2021  Allergen Reaction Noted   Ace inhibitors Cough 08/27/2010   Lisinopril Cough 09/25/2015   Penicillins  12/14/2007   Tetracycline Nausea Only 12/14/2007   Lamictal [lamotrigine] Rash 08/27/2010    Family History  Problem Relation Age of Onset   Bipolar disorder Brother    Diabetes Father     Social History   Socioeconomic History   Marital status: Divorced    Spouse name: Not on file   Number of children: Not on file   Years of education: Not on file   Highest education level: Not on file  Occupational History   Occupation: PLANNING AND INSPECTOR    Employer: [employer]  Tobacco Use   Smoking status: Former    Packs/day: 1.00    Years: 20.00    Pack years: 20.00    Types: Cigarettes    Start date: 10/30/1984    Quit date: 07/08/2013    Years since quitting: 7.9   Smokeless tobacco: Never  Vaping Use   Vaping Use: Never used  Substance and Sexual Activity   Alcohol use: No    Alcohol/week: 0.0 standard drinks   Drug use: No   Sexual activity: Not Currently  Other Topics Concern   Not on file  Social History Narrative   She has lived with some of her daughters. Works part-time for the city.    Social Determinants of Health   Financial Resource Strain: Not on file  Food Insecurity: Not on file   Transportation Needs: Not on file  Physical Activity: Not on file  Stress: Not on file  Social Connections: Not on file    Subjective: Review of Systems  Constitutional:  Negative for chills and fever.  HENT:  Negative for congestion and hearing loss.   Eyes:  Negative for blurred vision and double vision.  Respiratory:  Negative for cough and shortness of breath.   Cardiovascular:  Negative for chest pain and palpitations.  Gastrointestinal:  Positive for heartburn. Negative for blood in stool, constipation, melena and vomiting.       Poor appetite  Genitourinary:  Negative for dysuria and urgency.  Musculoskeletal:  Negative for joint pain and myalgias.  Skin:  Negative for itching and rash.  Neurological:  Negative for dizziness and headaches.  Psychiatric/Behavioral:  Negative for depression. The patient is not nervous/anxious.     Objective: BP (!) 124/48 (BP Location: Right Arm, Patient Position: Sitting, Cuff Size: Large)   Pulse 60   Temp (!) 97.5 F (36.4 C) (Temporal)   Ht '5\' 3"'$  (1.6 m)   Wt 211 lb 9.6 oz (96 kg)   SpO2 92%   BMI 37.48 kg/m  Physical Exam Constitutional:      Appearance: Normal appearance. She is obese.  HENT:     Head: Normocephalic and atraumatic.  Eyes:     Extraocular Movements: Extraocular movements intact.     Conjunctiva/sclera: Conjunctivae normal.  Cardiovascular:     Rate and Rhythm: Normal rate and regular rhythm.  Pulmonary:     Effort: Pulmonary effort is normal.     Breath sounds: Normal breath sounds.  Abdominal:     General: Bowel sounds are normal.     Palpations: Abdomen is soft.  Musculoskeletal:        General: No swelling. Normal range of motion.  Cervical back: Normal range of motion and neck supple.  Skin:    General: Skin is warm and dry.     Coloration: Skin is not jaundiced.  Neurological:     General: No focal deficit present.     Mental Status: She is alert and oriented to person, place, and time.   Psychiatric:        Mood and Affect: Mood normal.        Behavior: Behavior normal.     Assessment: *Chronic GERD-well-controlled on Nexium *Dysphagia-improved status post dilation *Poor appetite *Generalized weakness, multiple falls *Abnormal brain lesion *Adenomatous colon polyps   Plan: GERD well-controlled on Nexium.  We will continue.  Patient notes poor appetite though her weight is stable.  Recommended continued supplementation with protein shakes.  Dysphagia improved, will continue to monitor.  Given her generalized weakness, multiple falls, abnormal recent CT of her brain, patient is requesting referral to neurology which is reasonable.  I will place referral today.  We will also order MRI to further evaluate.  Given her CKD, this will be done without contrast.  Colonoscopy recall November 2024.  Follow-up in 6 months with GI.  06/11/2021 12:52 PM   Disclaimer: This note was dictated with voice recognition software. Similar sounding words can inadvertently be transcribed and may not be corrected upon review.

## 2021-06-12 ENCOUNTER — Encounter (HOSPITAL_COMMUNITY): Payer: Self-pay | Admitting: Emergency Medicine

## 2021-06-12 ENCOUNTER — Other Ambulatory Visit (HOSPITAL_COMMUNITY): Payer: Medicare Other

## 2021-06-12 ENCOUNTER — Inpatient Hospital Stay (HOSPITAL_COMMUNITY)
Admission: EM | Admit: 2021-06-12 | Discharge: 2021-06-18 | DRG: 682 | Disposition: A | Discharge status: Skilled Nursing Facility | Payer: Medicare Other | Attending: Internal Medicine | Admitting: Internal Medicine

## 2021-06-12 ENCOUNTER — Emergency Department (HOSPITAL_COMMUNITY): Payer: Medicare Other

## 2021-06-12 ENCOUNTER — Other Ambulatory Visit: Payer: Self-pay

## 2021-06-12 DIAGNOSIS — R251 Tremor, unspecified: Secondary | ICD-10-CM | POA: Diagnosis not present

## 2021-06-12 DIAGNOSIS — D631 Anemia in chronic kidney disease: Secondary | ICD-10-CM | POA: Diagnosis not present

## 2021-06-12 DIAGNOSIS — J984 Other disorders of lung: Secondary | ICD-10-CM | POA: Diagnosis not present

## 2021-06-12 DIAGNOSIS — N183 Chronic kidney disease, stage 3 unspecified: Secondary | ICD-10-CM | POA: Diagnosis not present

## 2021-06-12 DIAGNOSIS — R29818 Other symptoms and signs involving the nervous system: Secondary | ICD-10-CM | POA: Diagnosis not present

## 2021-06-12 DIAGNOSIS — N170 Acute kidney failure with tubular necrosis: Secondary | ICD-10-CM | POA: Diagnosis present

## 2021-06-12 DIAGNOSIS — J9601 Acute respiratory failure with hypoxia: Secondary | ICD-10-CM | POA: Diagnosis present

## 2021-06-12 DIAGNOSIS — N1832 Chronic kidney disease, stage 3b: Secondary | ICD-10-CM

## 2021-06-12 DIAGNOSIS — E1169 Type 2 diabetes mellitus with other specified complication: Secondary | ICD-10-CM | POA: Diagnosis present

## 2021-06-12 DIAGNOSIS — R918 Other nonspecific abnormal finding of lung field: Secondary | ICD-10-CM | POA: Diagnosis not present

## 2021-06-12 DIAGNOSIS — R2681 Unsteadiness on feet: Secondary | ICD-10-CM | POA: Diagnosis not present

## 2021-06-12 DIAGNOSIS — I959 Hypotension, unspecified: Secondary | ICD-10-CM | POA: Diagnosis not present

## 2021-06-12 DIAGNOSIS — D649 Anemia, unspecified: Secondary | ICD-10-CM | POA: Diagnosis present

## 2021-06-12 DIAGNOSIS — E1122 Type 2 diabetes mellitus with diabetic chronic kidney disease: Secondary | ICD-10-CM | POA: Diagnosis present

## 2021-06-12 DIAGNOSIS — Z87891 Personal history of nicotine dependence: Secondary | ICD-10-CM | POA: Diagnosis not present

## 2021-06-12 DIAGNOSIS — Z833 Family history of diabetes mellitus: Secondary | ICD-10-CM | POA: Diagnosis not present

## 2021-06-12 DIAGNOSIS — E782 Mixed hyperlipidemia: Secondary | ICD-10-CM | POA: Diagnosis present

## 2021-06-12 DIAGNOSIS — N189 Chronic kidney disease, unspecified: Secondary | ICD-10-CM | POA: Diagnosis not present

## 2021-06-12 DIAGNOSIS — Z7982 Long term (current) use of aspirin: Secondary | ICD-10-CM

## 2021-06-12 DIAGNOSIS — E11649 Type 2 diabetes mellitus with hypoglycemia without coma: Secondary | ICD-10-CM | POA: Diagnosis not present

## 2021-06-12 DIAGNOSIS — K219 Gastro-esophageal reflux disease without esophagitis: Secondary | ICD-10-CM | POA: Diagnosis present

## 2021-06-12 DIAGNOSIS — Z9842 Cataract extraction status, left eye: Secondary | ICD-10-CM

## 2021-06-12 DIAGNOSIS — I130 Hypertensive heart and chronic kidney disease with heart failure and stage 1 through stage 4 chronic kidney disease, or unspecified chronic kidney disease: Secondary | ICD-10-CM | POA: Diagnosis present

## 2021-06-12 DIAGNOSIS — G4733 Obstructive sleep apnea (adult) (pediatric): Secondary | ICD-10-CM | POA: Diagnosis present

## 2021-06-12 DIAGNOSIS — N179 Acute kidney failure, unspecified: Principal | ICD-10-CM

## 2021-06-12 DIAGNOSIS — E860 Dehydration: Secondary | ICD-10-CM | POA: Diagnosis present

## 2021-06-12 DIAGNOSIS — N19 Unspecified kidney failure: Secondary | ICD-10-CM | POA: Diagnosis not present

## 2021-06-12 DIAGNOSIS — I5030 Unspecified diastolic (congestive) heart failure: Secondary | ICD-10-CM | POA: Diagnosis not present

## 2021-06-12 DIAGNOSIS — Z20822 Contact with and (suspected) exposure to covid-19: Secondary | ICD-10-CM | POA: Diagnosis present

## 2021-06-12 DIAGNOSIS — I129 Hypertensive chronic kidney disease with stage 1 through stage 4 chronic kidney disease, or unspecified chronic kidney disease: Secondary | ICD-10-CM | POA: Diagnosis not present

## 2021-06-12 DIAGNOSIS — J189 Pneumonia, unspecified organism: Secondary | ICD-10-CM | POA: Diagnosis present

## 2021-06-12 DIAGNOSIS — R001 Bradycardia, unspecified: Secondary | ICD-10-CM | POA: Diagnosis not present

## 2021-06-12 DIAGNOSIS — G319 Degenerative disease of nervous system, unspecified: Secondary | ICD-10-CM | POA: Diagnosis not present

## 2021-06-12 DIAGNOSIS — J181 Lobar pneumonia, unspecified organism: Secondary | ICD-10-CM | POA: Diagnosis not present

## 2021-06-12 DIAGNOSIS — I10 Essential (primary) hypertension: Secondary | ICD-10-CM | POA: Diagnosis present

## 2021-06-12 DIAGNOSIS — J8410 Pulmonary fibrosis, unspecified: Secondary | ICD-10-CM | POA: Diagnosis not present

## 2021-06-12 DIAGNOSIS — Z880 Allergy status to penicillin: Secondary | ICD-10-CM

## 2021-06-12 DIAGNOSIS — J9811 Atelectasis: Secondary | ICD-10-CM | POA: Diagnosis not present

## 2021-06-12 DIAGNOSIS — E8779 Other fluid overload: Secondary | ICD-10-CM | POA: Diagnosis not present

## 2021-06-12 DIAGNOSIS — G930 Cerebral cysts: Secondary | ICD-10-CM | POA: Diagnosis not present

## 2021-06-12 DIAGNOSIS — R1319 Other dysphagia: Secondary | ICD-10-CM | POA: Diagnosis present

## 2021-06-12 DIAGNOSIS — R531 Weakness: Secondary | ICD-10-CM | POA: Diagnosis not present

## 2021-06-12 DIAGNOSIS — E669 Obesity, unspecified: Secondary | ICD-10-CM | POA: Diagnosis not present

## 2021-06-12 DIAGNOSIS — Z79899 Other long term (current) drug therapy: Secondary | ICD-10-CM

## 2021-06-12 DIAGNOSIS — Z9841 Cataract extraction status, right eye: Secondary | ICD-10-CM

## 2021-06-12 DIAGNOSIS — I2510 Atherosclerotic heart disease of native coronary artery without angina pectoris: Secondary | ICD-10-CM | POA: Diagnosis present

## 2021-06-12 DIAGNOSIS — D638 Anemia in other chronic diseases classified elsewhere: Secondary | ICD-10-CM | POA: Diagnosis not present

## 2021-06-12 DIAGNOSIS — I5032 Chronic diastolic (congestive) heart failure: Secondary | ICD-10-CM

## 2021-06-12 DIAGNOSIS — Q046 Congenital cerebral cysts: Secondary | ICD-10-CM | POA: Diagnosis not present

## 2021-06-12 DIAGNOSIS — R0609 Other forms of dyspnea: Secondary | ICD-10-CM | POA: Diagnosis not present

## 2021-06-12 DIAGNOSIS — R262 Difficulty in walking, not elsewhere classified: Secondary | ICD-10-CM | POA: Diagnosis present

## 2021-06-12 DIAGNOSIS — Z888 Allergy status to other drugs, medicaments and biological substances status: Secondary | ICD-10-CM | POA: Diagnosis not present

## 2021-06-12 DIAGNOSIS — R279 Unspecified lack of coordination: Secondary | ICD-10-CM | POA: Diagnosis not present

## 2021-06-12 DIAGNOSIS — J811 Chronic pulmonary edema: Secondary | ICD-10-CM | POA: Diagnosis not present

## 2021-06-12 DIAGNOSIS — M6281 Muscle weakness (generalized): Secondary | ICD-10-CM | POA: Diagnosis present

## 2021-06-12 DIAGNOSIS — Z043 Encounter for examination and observation following other accident: Secondary | ICD-10-CM | POA: Diagnosis not present

## 2021-06-12 DIAGNOSIS — F339 Major depressive disorder, recurrent, unspecified: Secondary | ICD-10-CM | POA: Diagnosis present

## 2021-06-12 DIAGNOSIS — Z794 Long term (current) use of insulin: Secondary | ICD-10-CM

## 2021-06-12 DIAGNOSIS — N184 Chronic kidney disease, stage 4 (severe): Secondary | ICD-10-CM | POA: Diagnosis not present

## 2021-06-12 DIAGNOSIS — W19XXXD Unspecified fall, subsequent encounter: Secondary | ICD-10-CM | POA: Diagnosis not present

## 2021-06-12 DIAGNOSIS — R0602 Shortness of breath: Secondary | ICD-10-CM | POA: Diagnosis not present

## 2021-06-12 LAB — BASIC METABOLIC PANEL
Anion gap: 10 (ref 5–15)
BUN: 99 mg/dL — ABNORMAL HIGH (ref 8–23)
CO2: 20 mmol/L — ABNORMAL LOW (ref 22–32)
Calcium: 8.5 mg/dL — ABNORMAL LOW (ref 8.9–10.3)
Chloride: 107 mmol/L (ref 98–111)
Creatinine, Ser: 3.72 mg/dL — ABNORMAL HIGH (ref 0.44–1.00)
GFR, Estimated: 12 mL/min — ABNORMAL LOW (ref >60)
Glucose, Bld: 149 mg/dL — ABNORMAL HIGH (ref 70–99)
Potassium: 4.3 mmol/L (ref 3.5–5.1)
Sodium: 137 mmol/L (ref 135–145)

## 2021-06-12 LAB — DIFFERENTIAL
Abs Immature Granulocytes: 0.04 10*3/uL (ref 0.00–0.07)
Basophils Absolute: 0.1 10*3/uL (ref 0.0–0.1)
Basophils Relative: 1 %
Eosinophils Absolute: 0.1 10*3/uL (ref 0.0–0.5)
Eosinophils Relative: 1 %
Immature Granulocytes: 0 %
Lymphocytes Relative: 10 %
Lymphs Abs: 1 10*3/uL (ref 0.7–4.0)
Monocytes Absolute: 0.7 10*3/uL (ref 0.1–1.0)
Monocytes Relative: 6 %
Neutro Abs: 8.8 10*3/uL — ABNORMAL HIGH (ref 1.7–7.7)
Neutrophils Relative %: 82 %

## 2021-06-12 LAB — CBC
HCT: 25.2 % — ABNORMAL LOW (ref 36.0–46.0)
Hemoglobin: 7.9 g/dL — ABNORMAL LOW (ref 12.0–15.0)
MCH: 30.4 pg (ref 26.0–34.0)
MCHC: 31.3 g/dL (ref 30.0–36.0)
MCV: 96.9 fL (ref 80.0–100.0)
Platelets: 196 10*3/uL (ref 150–400)
RBC: 2.6 MIL/uL — ABNORMAL LOW (ref 3.87–5.11)
RDW: 14.2 % (ref 11.5–15.5)
WBC: 10.6 10*3/uL — ABNORMAL HIGH (ref 4.0–10.5)
nRBC: 0 % (ref 0.0–0.2)

## 2021-06-12 LAB — SARS CORONAVIRUS 2 BY RT PCR: SARS Coronavirus 2 by RT PCR: NEGATIVE

## 2021-06-12 MED ORDER — SODIUM CHLORIDE 0.9 % IV BOLUS
500.0000 mL | Freq: Once | INTRAVENOUS | Status: AC
  Administered 2021-06-12: 500 mL via INTRAVENOUS

## 2021-06-12 MED ORDER — SODIUM CHLORIDE 0.9 % IV SOLN
1.0000 g | Freq: Once | INTRAVENOUS | Status: AC
  Administered 2021-06-12: 1 g via INTRAVENOUS
  Filled 2021-06-12: qty 10

## 2021-06-12 MED ORDER — SODIUM CHLORIDE 0.9 % IV SOLN: INTRAVENOUS | Status: DC

## 2021-06-12 MED ORDER — SODIUM CHLORIDE 0.9 % IV SOLN
500.0000 mg | Freq: Once | INTRAVENOUS | Status: AC
  Administered 2021-06-12: 500 mg via INTRAVENOUS
  Filled 2021-06-12: qty 5

## 2021-06-12 NOTE — ED Triage Notes (Signed)
Pt having increased falling sent at [facility] on Friday Ct revealed cyst on brain, also has kidney disease, kidney specialist found abnormal labs.

## 2021-06-12 NOTE — ED Provider Notes (Addendum)
[facility] EMERGENCY DEPARTMENT Provider Note   CSN: [id number] Arrival date & time: 06/12/21  1640     History  Chief Complaint  Patient presents with   Abnormal Lab    Christy [name] is a 71 y.o. female.  Sent in from Dr. Theador Hawthorne office.  Sent in for worsening renal function.  Patient's creatinine May 19 was 2.12 at [facility].  And the here today it is 3.72.  With significant worsening of GFR function.  Potassium is normal.  Patient's family has been concerned that over the past week patient has been having difficulty walking having some memory problems.  And has developed a tremor.  Nephrology Dr. Theador Hawthorne recommend she stop taking her Demadex.  She has been taking it up to today.  20 mg not sure how often.  Patient also on trazodone.  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  Patient was seen in the emergency department in [place] on May 19 as mentioned above.  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  They felt that was not responsible for her neurologic or mental status changes.        Home Medications Prior to Admission medications   Medication Sig Start Date End Date Taking? Authorizing Provider  aspirin 81 MG EC tablet Take 81 mg by mouth at bedtime.    Yes [provider]  epoetin alfa (EPOGEN) 3000 UNIT/ML injection Inject into the skin. 06/05/21  Yes [provider]  hydrOXYzine (VISTARIL) 50 MG capsule Take 50 mg by mouth 3 (three) times daily as needed for anxiety or itching. 04/21/21  Yes [provider]  TRESIBA FLEXTOUCH 200 UNIT/ML SOPN Inject 28 Units into the skin in the morning. 04/21/16  Yes [provider]  triamcinolone cream (KENALOG) 0.1 % apply cream TWICE DAILY AS NEEDED 04/09/21  Yes [provider]   allopurinol (ZYLOPRIM) 300 MG tablet Take 150 mg by mouth in the morning.    [provider]  amLODipine (NORVASC) 10 MG tablet Take 10 mg by mouth daily. 06/02/21   [provider]  BESIVANCE 0.6 % SUSP Place 1 drop into the left eye See admin instructions. Instill one drop into the left eye four times daily for 2 days after each monthly injection Patient not taking: Reported on 06/11/2021 11/03/17   [provider]  buPROPion (WELLBUTRIN XL) 300 MG 24 hr tablet Take 300 mg by mouth in the morning. 11/18/09   [provider]  calcitRIOL (ROCALTROL) 0.25 MCG capsule Take 0.25 mcg by mouth 3 (three) times a week. 03/26/21   [provider]  calcium carbonate (OS-CAL) 600 MG TABS Take 600 mg by mouth daily.   Patient not taking: Reported on 06/11/2021    [provider]  cariprazine (VRAYLAR) 1.5 MG capsule Take by mouth.    [provider]  carvedilol (COREG) 12.5 MG tablet Take 12.5 mg by mouth 2 (two) times daily with a meal.    [provider]  Cholecalciferol (VITAMIN D3) 25 MCG (1000 UT) CAPS Take 1,000 Units by mouth daily.  Patient not taking: Reported on 06/11/2021    [provider]  clotrimazole (LOTRIMIN) 1 % cream Apply 1 application topically daily as needed (itching). Patient not taking: Reported on 06/11/2021    [provider]  divalproex (DEPAKOTE) 250  MG EC tablet Take 250 mg by mouth in the morning.    [provider]  esomeprazole (NEXIUM) 40 MG capsule Take 1 capsule (40 mg total) by mouth 2 (two) times daily before a meal. 01/01/21 01/01/22  [name], DO  famotidine (PEPCID) 20 MG tablet Take by mouth. Patient not taking: Reported on 06/12/2021 06/07/21 06/22/21  [provider]  fluconazole (DIFLUCAN) 100 MG tablet Take 100 mg by mouth daily. 06/02/21   [provider]  GLOBAL EASE INJECT PEN NEEDLES 32G X 4 MM MISC See admin instructions. 06/28/19   [provider]  hydrALAZINE (APRESOLINE) 100 MG tablet Take by mouth. 03/27/21 03/27/22  [provider]  isosorbide mononitrate (IMDUR) 60 MG 24 hr tablet Take 60 mg by mouth in the morning. 02/27/20   [provider]  losartan (COZAAR) 50 MG tablet Take 1 tablet by mouth daily. 11/15/20   [provider]  meclizine (ANTIVERT) 25 MG tablet Take 25 mg by mouth as needed for dizziness. Patient not taking: Reported on 06/11/2021 06/30/18   [provider]  metolazone (ZAROXOLYN) 5 MG tablet Take 5 mg by mouth daily as needed (fluid retention/shortness of breath). Patient not taking: Reported on 06/11/2021    [provider]  Multiple Vitamin (MULTIVITAMIN) tablet Take 1 tablet by mouth daily.   Patient not taking: Reported on 06/11/2021    [provider]  nitroGLYCERIN (NITROLINGUAL) 0.4 MG/SPRAY spray Place 1 spray under the tongue every 5 (five) minutes x 3 doses as needed. 01/09/14   Satira Sark, MD  NOVOLOG FLEXPEN 100 UNIT/ML FlexPen Inject 5-12 Units into the skin 3 (three) times daily as needed for high blood sugar. Patient not taking: Reported on 06/11/2021 08/25/19   [provider]  ondansetron (ZOFRAN-ODT) 4 MG disintegrating tablet Take by mouth. Patient not taking: Reported on 06/12/2021 06/07/21 06/14/21  [provider]  potassium chloride (KLOR-CON) 10 MEQ tablet Take 10 mEq by mouth daily as needed (when taking metolazone (fluid retention/shortness of breath)). Patient not taking: Reported on 06/11/2021 06/29/19   [provider]  rosuvastatin (CRESTOR) 10 MG tablet Take 10 mg by mouth in the morning.    [provider]  sertraline (ZOLOFT) 100 MG tablet Take 100 mg by mouth 2 (two) times daily.    [provider]  tiZANidine (ZANAFLEX) 4 MG tablet Take 4 mg by mouth every evening.    [provider]  torsemide (DEMADEX) 20 MG tablet Take 60 mg by mouth in the morning.     [provider]  traZODone (DESYREL) 100 MG tablet Take 100 mg by mouth at bedtime.  03/10/17   [provider]  VICTOZA 18 MG/3ML SOPN Inject 1.2 mg into the skin as needed. Patient not taking: Reported on 06/11/2021 08/02/19   [provider]      Allergies    Ace inhibitors, Lisinopril, Penicillins, Tetracycline, and Lamictal [lamotrigine]    Review of Systems   Review of Systems  Constitutional:  Negative for chills and fever.  HENT:  Negative for ear pain and sore throat.   Eyes:  Negative for pain and visual disturbance.  Respiratory:  Negative for cough and shortness of breath.   Cardiovascular:  Negative for chest pain and palpitations.  Gastrointestinal:  Negative for abdominal pain and vomiting.  Genitourinary:  Negative for dysuria and hematuria.  Musculoskeletal:  Negative for arthralgias and back pain.  Skin:  Negative for color change and rash.  Neurological:  Positive for tremors and weakness. Negative for seizures and syncope.  Psychiatric/Behavioral:  Positive for confusion.   All other systems reviewed and are negative.  Physical Exam Updated Vital Signs BP (!) 125/36   Pulse 62   Temp 97.9 F (36.6 C) (Oral)   Resp 19   Ht 1.6 m ($Remo'5\' 3"'PvZOt$ )   Wt 95.7 kg   SpO2 99%   BMI 37.38 kg/m  Physical Exam Vitals and nursing note reviewed.  Constitutional:      General: She is not in acute distress.    Appearance: Normal appearance. She is well-developed.  HENT:     Head: Normocephalic and atraumatic.  Eyes:     Extraocular Movements: Extraocular movements intact.     Conjunctiva/sclera: Conjunctivae normal.     Pupils: Pupils are equal, round, and reactive to light.  Cardiovascular:     Rate and Rhythm: Normal rate and regular rhythm.     Heart sounds: No murmur heard. Pulmonary:     Effort: Pulmonary effort is normal. No respiratory distress.     Breath sounds: Normal breath sounds.  Abdominal:     Palpations: Abdomen is soft.     Tenderness:  There is no abdominal tenderness.  Musculoskeletal:        General: No swelling.     Cervical back: Normal range of motion and neck supple.  Skin:    General: Skin is warm and dry.     Capillary Refill: Capillary refill takes less than 2 seconds.  Neurological:     Mental Status: She is alert.     Cranial Nerves: No cranial nerve deficit.     Sensory: No sensory deficit.     Motor: Weakness present.     Comments: Patient will follow commands.  Patient has a tremor but no significant upper extremity weakness.  Left lower extremity is a little stronger than right lower extremity.  Psychiatric:        Mood and Affect: Mood normal.    ED Results / Procedures / Treatments   Labs (all labs ordered are listed, but only abnormal results are displayed) Labs Reviewed  BASIC METABOLIC PANEL - Abnormal; Notable for the following components:      Result Value   CO2 20 (*)    Glucose, Bld 149 (*)    BUN 99 (*)    Creatinine, Ser 3.72 (*)    Calcium 8.5 (*)    GFR, Estimated 12 (*)    All other components within normal limits  CBC - Abnormal; Notable for the following components:   WBC 10.6 (*)    RBC 2.60 (*)    Hemoglobin 7.9 (*)    HCT 25.2 (*)    All other components within normal limits  DIFFERENTIAL - Abnormal; Notable for the following components:   Neutro Abs 8.8 (*)    All other components within normal limits  SARS CORONAVIRUS 2 BY RT PCR  URINALYSIS, ROUTINE W REFLEX MICROSCOPIC    EKG EKG Interpretation  Date/Time:  Thursday Jun 12 2021 17:13:56 EDT Ventricular Rate:  59 PR Interval:  192 QRS Duration: 164 QT Interval:  490 QTC Calculation: 485 R Axis:   184 Text Interpretation: Sinus bradycardia Right superior axis deviation Left bundle branch block Abnormal ECG When compared with ECG of 19-Jun-2020 10:57, No significant change was found Confirmed by Fredia Sorrow [phone]) on 06/12/2021 8:19:02 PM  Radiology CT Head Wo Contrast  Result Date:  06/12/2021 CLINICAL DATA:  Fall, cyst on  brain EXAM: CT HEAD WITHOUT CONTRAST TECHNIQUE: Contiguous axial images were obtained from the base of the skull through the vertex without intravenous contrast. RADIATION DOSE REDUCTION: This exam was performed according to the departmental dose-optimization program which includes automated exposure control, adjustment of the mA and/or kV according to patient size and/or use of iterative reconstruction technique. COMPARISON:  06/06/2021 FINDINGS: Brain: No evidence of acute infarction, hemorrhage, hydrocephalus, or extra-axial collection. Mild subcortical white matter and periventricular small vessel ischemic changes. Mild cortical atrophy. Stable posterior fossa arachnoid cyst. Vascular: Intracranial atherosclerosis. Skull: Normal. Negative for fracture or focal lesion. Sinuses/Orbits: The visualized paranasal sinuses are essentially clear. The mastoid air cells are unopacified. Other: None. IMPRESSION: No evidence of acute intracranial abnormality. Mild atrophy with small vessel ischemic changes. Stable posterior fossa arachnoid cyst. Electronically Signed   By: Julian Hy M.D.   On: 06/12/2021 21:34   CT Chest Wo Contrast  Result Date: 06/12/2021 CLINICAL DATA:  Pneumonia. EXAM: CT CHEST WITHOUT CONTRAST TECHNIQUE: Multidetector CT imaging of the chest was performed following the standard protocol without IV contrast. RADIATION DOSE REDUCTION: This exam was performed according to the departmental dose-optimization program which includes automated exposure control, adjustment of the mA and/or kV according to patient size and/or use of iterative reconstruction technique. COMPARISON:  Chest x-ray 06/12/2021. FINDINGS: Cardiovascular: The heart is enlarged. There is no pericardial effusion. Aorta is normal in size. There are atherosclerotic calcifications of the aorta. Mediastinum/Nodes: Visualized esophagus and thyroid gland are within normal limits. There are no  enlarged lymph nodes identified allowing for lack of intravenous contrast. Lungs/Pleura: There is some calcified granulomas in the left lung. There are minimal tree-in-bud and ill-defined nodular densities measuring up to 3 mm in the right upper lobe. There is a small amount of airspace consolidation in the right lower lobe. There is a 5 mm nodule in the left lower lobe image 5/56. There is minimal atelectasis in the lingula. There is no pleural effusion or pneumothorax identified. Upper Abdomen: Atherosclerotic calcifications are present. No acute findings. Musculoskeletal: Degenerative changes affect the spine. IMPRESSION: 1. Small amount of right lower lobe airspace disease worrisome for pneumonia. Recommend follow-up imaging to confirm complete resolution. 2. Tree-in-bud opacities in the right upper lobe, likely infectious/inflammatory. 3. 5 mm left solid pulmonary nodule. No routine follow-up imaging is recommended per Fleischner Society Guidelines. These guidelines do not apply to immunocompromised patients and patients with cancer. Follow up in patients with significant comorbidities as clinically warranted. For lung cancer screening, adhere to Lung-RADS guidelines. Reference: Radiology. 2017; 284(1):228-43. 4. Cardiomegaly. Aortic Atherosclerosis (ICD10-I70.0). Electronically Signed   By: Ronney Asters M.D.   On: 06/12/2021 21:31   DG Chest Port 1 View  Result Date: 06/12/2021 CLINICAL DATA:  Acute kidney injury. EXAM: PORTABLE CHEST 1 VIEW COMPARISON:  Chest x-ray 06/06/2021 FINDINGS: The heart is enlarged. There central pulmonary vascular congestion. There is prominence of the right hilum. There are some patchy opacities in the left lower lung. There is no pleural effusion or pneumothorax. No acute fractures are seen. IMPRESSION: 1. Cardiomegaly with central pulmonary vascular congestion. 2. Minimal left lower lung atelectasis/airspace disease. 3. Prominent of the right hilum may be secondary to edema.  Recommend follow-up PA and lateral chest x-ray when acute symptoms resolve. Electronically Signed   By: Ronney Asters M.D.   On: 06/12/2021 20:47    Procedures Procedures    Medications Ordered in ED Medications  cefTRIAXone (ROCEPHIN) 1 g in sodium chloride 0.9 % 100 mL IVPB (  has no administration in time range)  azithromycin (ZITHROMAX) 500 mg in sodium chloride 0.9 % 250 mL IVPB (has no administration in time range)  sodium chloride 0.9 % bolus 500 mL (has no administration in time range)  0.9 %  sodium chloride infusion (has no administration in time range)    ED Course/ Medical Decision Making/ A&P                           Medical Decision Making Amount and/or Complexity of Data Reviewed Labs: ordered. Radiology: ordered.  Risk Prescription drug management. Decision regarding hospitalization.   Patient's labs here significant for white blood cell count of 10.6.  Hemoglobin 7.9.  So a bit of anemia.  Hemoglobin that in [place] was in the 9 range.  So somewhat of a drop.  Patient denies any rectal bleeding.  Patient also recently diagnosed multiple myeloma or questions for multiple myeloma.  And have ordered differential.  Platelets are 196.  Differentials back absolute neutrophils are up at 8.8.  Have ordered urinalysis.  Chest x-ray cardiomegaly with central pulmonary vascular congestion minimal left lower lung atelectasis-airspace disease prominent right hilum may be secondary to edema.  Will get CT chest.  And will get CT head just to make sure no significant change from the CT on May 19.  Ultimately patient will require admission for acute kidney injury.  CT head without any acute findings.  Does show the unchanged subarachnoid cyst.  This should not be a cause of her symptoms.  CT chest does show evidence of a right upper lobe pneumonia.  Will treat with Rocephin and Zithromax IV.  Will discuss with hospitalist for admission.  Patient may need MRI done tomorrow to evaluate  the other mental status changes.  It is possible it could be secondary to the pneumonia.  Patient's hemoglobin will need to be followed as well.  Patient without any abdominal pain.  For the acute kidney injury will patient will be given some gentle fluid hydration.   Final Clinical Impression(s) / ED Diagnoses Final diagnoses:  AKI (acute kidney injury) ([place])  Tremors of nervous system  Community acquired pneumonia of right upper lobe of lung    Rx / DC Orders ED Discharge Orders     None         Fredia Sorrow, MD 06/12/21 2053    Fredia Sorrow, MD 06/12/21 2210

## 2021-06-12 NOTE — ED Notes (Signed)
Pt's only complaint is feeling "tired" at this time

## 2021-06-12 NOTE — ED Notes (Addendum)
Alert, NAD, calm, interactive.

## 2021-06-13 ENCOUNTER — Other Ambulatory Visit (HOSPITAL_COMMUNITY): Payer: Self-pay | Admitting: *Deleted

## 2021-06-13 ENCOUNTER — Inpatient Hospital Stay (HOSPITAL_COMMUNITY): Payer: Medicare Other

## 2021-06-13 ENCOUNTER — Ambulatory Visit (HOSPITAL_BASED_OUTPATIENT_CLINIC_OR_DEPARTMENT_OTHER): Admission: RE | Admit: 2021-06-13 | Payer: Medicare Other | Source: Ambulatory Visit

## 2021-06-13 DIAGNOSIS — J9601 Acute respiratory failure with hypoxia: Secondary | ICD-10-CM | POA: Diagnosis not present

## 2021-06-13 DIAGNOSIS — N179 Acute kidney failure, unspecified: Secondary | ICD-10-CM

## 2021-06-13 DIAGNOSIS — I10 Essential (primary) hypertension: Secondary | ICD-10-CM

## 2021-06-13 DIAGNOSIS — E669 Obesity, unspecified: Secondary | ICD-10-CM

## 2021-06-13 DIAGNOSIS — J189 Pneumonia, unspecified organism: Secondary | ICD-10-CM

## 2021-06-13 DIAGNOSIS — E1169 Type 2 diabetes mellitus with other specified complication: Secondary | ICD-10-CM

## 2021-06-13 DIAGNOSIS — K219 Gastro-esophageal reflux disease without esophagitis: Secondary | ICD-10-CM

## 2021-06-13 DIAGNOSIS — R0609 Other forms of dyspnea: Secondary | ICD-10-CM

## 2021-06-13 DIAGNOSIS — D649 Anemia, unspecified: Secondary | ICD-10-CM | POA: Diagnosis not present

## 2021-06-13 DIAGNOSIS — R531 Weakness: Secondary | ICD-10-CM

## 2021-06-13 DIAGNOSIS — I5032 Chronic diastolic (congestive) heart failure: Secondary | ICD-10-CM

## 2021-06-13 LAB — COMPREHENSIVE METABOLIC PANEL
ALT: 18 U/L (ref 0–44)
AST: 17 U/L (ref 15–41)
Albumin: 3.1 g/dL — ABNORMAL LOW (ref 3.5–5.0)
Alkaline Phosphatase: 52 U/L (ref 38–126)
Anion gap: 12 (ref 5–15)
BUN: 98 mg/dL — ABNORMAL HIGH (ref 8–23)
CO2: 18 mmol/L — ABNORMAL LOW (ref 22–32)
Calcium: 8.2 mg/dL — ABNORMAL LOW (ref 8.9–10.3)
Chloride: 110 mmol/L (ref 98–111)
Creatinine, Ser: 3.63 mg/dL — ABNORMAL HIGH (ref 0.44–1.00)
GFR, Estimated: 13 mL/min — ABNORMAL LOW (ref >60)
Glucose, Bld: 119 mg/dL — ABNORMAL HIGH (ref 70–99)
Potassium: 4 mmol/L (ref 3.5–5.1)
Sodium: 140 mmol/L (ref 135–145)
Total Bilirubin: 0.7 mg/dL (ref 0.3–1.2)
Total Protein: 6 g/dL — ABNORMAL LOW (ref 6.5–8.1)

## 2021-06-13 LAB — CBC
HCT: 24 % — ABNORMAL LOW (ref 36.0–46.0)
Hemoglobin: 7.3 g/dL — ABNORMAL LOW (ref 12.0–15.0)
MCH: 29.9 pg (ref 26.0–34.0)
MCHC: 30.4 g/dL (ref 30.0–36.0)
MCV: 98.4 fL (ref 80.0–100.0)
Platelets: 170 10*3/uL (ref 150–400)
RBC: 2.44 MIL/uL — ABNORMAL LOW (ref 3.87–5.11)
RDW: 14.4 % (ref 11.5–15.5)
WBC: 9.3 10*3/uL (ref 4.0–10.5)
nRBC: 0 % (ref 0.0–0.2)

## 2021-06-13 LAB — RETICULOCYTES
Immature Retic Fract: 24.1 % — ABNORMAL HIGH (ref 2.3–15.9)
RBC.: 2.42 MIL/uL — ABNORMAL LOW (ref 3.87–5.11)
Retic Count, Absolute: 68 10*3/uL (ref 19.0–186.0)
Retic Ct Pct: 2.8 % (ref 0.4–3.1)

## 2021-06-13 LAB — URINALYSIS, ROUTINE W REFLEX MICROSCOPIC
Bilirubin Urine: NEGATIVE
Glucose, UA: NEGATIVE mg/dL
Hgb urine dipstick: NEGATIVE
Ketones, ur: 5 mg/dL — AB
Leukocytes,Ua: NEGATIVE
Nitrite: NEGATIVE
Protein, ur: 30 mg/dL — AB
Specific Gravity, Urine: 1.015 (ref 1.005–1.030)
pH: 5 (ref 5.0–8.0)

## 2021-06-13 LAB — HIV ANTIBODY (ROUTINE TESTING W REFLEX): HIV Screen 4th Generation wRfx: NONREACTIVE

## 2021-06-13 LAB — BRAIN NATRIURETIC PEPTIDE: B Natriuretic Peptide: 713 pg/mL — ABNORMAL HIGH (ref 0.0–100.0)

## 2021-06-13 LAB — ECHOCARDIOGRAM COMPLETE
AR max vel: 1.94 cm2
AV Area VTI: 1.77 cm2
AV Area mean vel: 1.87 cm2
AV Mean grad: 11 mmHg
AV Peak grad: 18.7 mmHg
Ao pk vel: 2.16 m/s
Area-P 1/2: 4.31 cm2
Calc EF: 62 %
Height: 63 in
S' Lateral: 3.3 cm
Single Plane A2C EF: 56.3 %
Single Plane A4C EF: 67.3 %
Weight: 3463.87 oz

## 2021-06-13 LAB — TSH: TSH: 1.764 u[IU]/mL (ref 0.350–4.500)

## 2021-06-13 LAB — GLUCOSE, CAPILLARY
Glucose-Capillary: 131 mg/dL — ABNORMAL HIGH (ref 70–99)
Glucose-Capillary: 126 mg/dL — ABNORMAL HIGH (ref 70–99)
Glucose-Capillary: 149 mg/dL — ABNORMAL HIGH (ref 70–99)
Glucose-Capillary: 166 mg/dL — ABNORMAL HIGH (ref 70–99)

## 2021-06-13 LAB — IRON AND TIBC
Iron: 35 ug/dL (ref 28–170)
Saturation Ratios: 20 % (ref 10.4–31.8)
TIBC: 179 ug/dL — ABNORMAL LOW (ref 250–450)
UIBC: 144 ug/dL

## 2021-06-13 LAB — PROCALCITONIN: Procalcitonin: 0.13 ng/mL

## 2021-06-13 LAB — MAGNESIUM: Magnesium: 2.1 mg/dL (ref 1.7–2.4)

## 2021-06-13 LAB — STREP PNEUMONIAE URINARY ANTIGEN: Strep Pneumo Urinary Antigen: NEGATIVE

## 2021-06-13 LAB — HEMOGLOBIN A1C
Hgb A1c MFr Bld: 5.9 % — ABNORMAL HIGH (ref 4.8–5.6)
Mean Plasma Glucose: 122.63 mg/dL

## 2021-06-13 LAB — FOLATE: Folate: 12.2 ng/mL (ref >5.9)

## 2021-06-13 LAB — FERRITIN: Ferritin: 143 ng/mL (ref 11–307)

## 2021-06-13 LAB — VITAMIN B12: Vitamin B-12: 376 pg/mL (ref 180–914)

## 2021-06-13 MED ORDER — ISOSORBIDE MONONITRATE ER 60 MG PO TB24
60.0000 mg | ORAL_TABLET | Freq: Every day | ORAL | Status: DC
  Administered 2021-06-13 – 2021-06-18 (×6): 60 mg via ORAL
  Filled 2021-06-13 (×6): qty 1

## 2021-06-13 MED ORDER — CALCITRIOL 0.25 MCG PO CAPS
0.2500 ug | ORAL_CAPSULE | ORAL | Status: DC
  Administered 2021-06-13 – 2021-06-18 (×3): 0.25 ug via ORAL
  Filled 2021-06-13 (×5): qty 1

## 2021-06-13 MED ORDER — OXYCODONE HCL 5 MG PO TABS: 5.0000 mg | ORAL_TABLET | ORAL | Status: DC | PRN

## 2021-06-13 MED ORDER — HYDRALAZINE HCL 25 MG PO TABS
100.0000 mg | ORAL_TABLET | Freq: Three times a day (TID) | ORAL | Status: DC
  Filled 2021-06-13 (×2): qty 4

## 2021-06-13 MED ORDER — HYDRALAZINE HCL 25 MG PO TABS
50.0000 mg | ORAL_TABLET | Freq: Three times a day (TID) | ORAL | Status: DC
  Administered 2021-06-13 – 2021-06-18 (×16): 50 mg via ORAL
  Filled 2021-06-13 (×15): qty 2

## 2021-06-13 MED ORDER — ROSUVASTATIN CALCIUM 10 MG PO TABS
10.0000 mg | ORAL_TABLET | Freq: Every morning | ORAL | Status: DC
  Administered 2021-06-13 – 2021-06-18 (×6): 10 mg via ORAL
  Filled 2021-06-13 (×6): qty 1

## 2021-06-13 MED ORDER — ONDANSETRON HCL 4 MG PO TABS: 4.0000 mg | ORAL_TABLET | Freq: Four times a day (QID) | ORAL | Status: DC | PRN

## 2021-06-13 MED ORDER — HEPARIN SODIUM (PORCINE) 5000 UNIT/ML IJ SOLN
5000.0000 [IU] | Freq: Three times a day (TID) | INTRAMUSCULAR | Status: DC
  Administered 2021-06-13 – 2021-06-18 (×16): 5000 [IU] via SUBCUTANEOUS
  Filled 2021-06-13 (×17): qty 1

## 2021-06-13 MED ORDER — CARVEDILOL 12.5 MG PO TABS
12.5000 mg | ORAL_TABLET | Freq: Two times a day (BID) | ORAL | Status: DC
  Administered 2021-06-13 – 2021-06-18 (×11): 12.5 mg via ORAL
  Filled 2021-06-13 (×11): qty 1

## 2021-06-13 MED ORDER — CARIPRAZINE HCL 1.5 MG PO CAPS
1.5000 mg | ORAL_CAPSULE | Freq: Every day | ORAL | Status: DC
  Administered 2021-06-13 – 2021-06-18 (×6): 1.5 mg via ORAL
  Filled 2021-06-13 (×8): qty 1

## 2021-06-13 MED ORDER — SODIUM CHLORIDE 0.9 % IV SOLN
INTRAVENOUS | Status: AC
Start: 2021-06-13 — End: 2021-06-13

## 2021-06-13 MED ORDER — HYDROXYZINE HCL 25 MG PO TABS: 50.0000 mg | ORAL_TABLET | Freq: Three times a day (TID) | ORAL | Status: DC | PRN

## 2021-06-13 MED ORDER — AMLODIPINE BESYLATE 5 MG PO TABS
5.0000 mg | ORAL_TABLET | Freq: Every day | ORAL | Status: DC
  Administered 2021-06-13 – 2021-06-18 (×6): 5 mg via ORAL
  Filled 2021-06-13 (×6): qty 1

## 2021-06-13 MED ORDER — SODIUM CHLORIDE 0.9 % IV SOLN
1.0000 g | INTRAVENOUS | Status: DC
  Administered 2021-06-13 – 2021-06-15 (×3): 1 g via INTRAVENOUS
  Filled 2021-06-13 (×3): qty 10

## 2021-06-13 MED ORDER — ACETAMINOPHEN 325 MG PO TABS
650.0000 mg | ORAL_TABLET | Freq: Four times a day (QID) | ORAL | Status: DC | PRN
Start: 2021-06-13 — End: 2021-06-18

## 2021-06-13 MED ORDER — INSULIN ASPART 100 UNIT/ML IJ SOLN
0.0000 [IU] | Freq: Every day | INTRAMUSCULAR | Status: DC
  Administered 2021-06-16 – 2021-06-17 (×2): 2 [IU] via SUBCUTANEOUS

## 2021-06-13 MED ORDER — HYDROXYZINE PAMOATE 50 MG PO CAPS: 50.0000 mg | ORAL_CAPSULE | Freq: Three times a day (TID) | ORAL | Status: DC | PRN

## 2021-06-13 MED ORDER — ACETAMINOPHEN 650 MG RE SUPP: 650.0000 mg | Freq: Four times a day (QID) | RECTAL | Status: DC | PRN

## 2021-06-13 MED ORDER — LOSARTAN POTASSIUM 50 MG PO TABS: 50.0000 mg | ORAL_TABLET | Freq: Every day | ORAL | Status: DC

## 2021-06-13 MED ORDER — MORPHINE SULFATE (PF) 2 MG/ML IV SOLN: 2.0000 mg | INTRAVENOUS | Status: DC | PRN

## 2021-06-13 MED ORDER — BUPROPION HCL ER (XL) 300 MG PO TB24
300.0000 mg | ORAL_TABLET | Freq: Every day | ORAL | Status: DC
Start: 2021-06-13 — End: 2021-06-18
  Administered 2021-06-13 – 2021-06-18 (×6): 300 mg via ORAL
  Filled 2021-06-13 (×6): qty 1

## 2021-06-13 MED ORDER — ALBUTEROL SULFATE (2.5 MG/3ML) 0.083% IN NEBU: 2.5000 mg | INHALATION_SOLUTION | RESPIRATORY_TRACT | Status: DC | PRN

## 2021-06-13 MED ORDER — TRAZODONE HCL 50 MG PO TABS
100.0000 mg | ORAL_TABLET | Freq: Every day | ORAL | Status: DC
  Administered 2021-06-13 – 2021-06-17 (×5): 100 mg via ORAL
  Filled 2021-06-13 (×5): qty 2

## 2021-06-13 MED ORDER — INSULIN ASPART 100 UNIT/ML IJ SOLN
0.0000 [IU] | Freq: Three times a day (TID) | INTRAMUSCULAR | Status: DC
  Administered 2021-06-13 (×3): 2 [IU] via SUBCUTANEOUS
  Administered 2021-06-14 – 2021-06-17 (×5): 3 [IU] via SUBCUTANEOUS
  Administered 2021-06-17 – 2021-06-18 (×2): 2 [IU] via SUBCUTANEOUS

## 2021-06-13 MED ORDER — ONDANSETRON HCL 4 MG/2ML IJ SOLN: 4.0000 mg | Freq: Four times a day (QID) | INTRAMUSCULAR | Status: DC | PRN

## 2021-06-13 MED ORDER — DIVALPROEX SODIUM 250 MG PO DR TAB
250.0000 mg | DELAYED_RELEASE_TABLET | Freq: Every day | ORAL | Status: DC
  Administered 2021-06-13 – 2021-06-18 (×6): 250 mg via ORAL
  Filled 2021-06-13 (×7): qty 1

## 2021-06-13 MED ORDER — PANTOPRAZOLE SODIUM 40 MG PO TBEC
40.0000 mg | DELAYED_RELEASE_TABLET | Freq: Every day | ORAL | Status: DC
Start: 2021-06-13 — End: 2021-06-18
  Administered 2021-06-13 – 2021-06-18 (×6): 40 mg via ORAL
  Filled 2021-06-13 (×6): qty 1

## 2021-06-13 MED ORDER — INSULIN DETEMIR 100 UNIT/ML ~~LOC~~ SOLN
20.0000 [IU] | Freq: Every day | SUBCUTANEOUS | Status: DC
  Administered 2021-06-13 – 2021-06-16 (×3): 20 [IU] via SUBCUTANEOUS
  Filled 2021-06-13 (×5): qty 0.2

## 2021-06-13 MED ORDER — AMLODIPINE BESYLATE 5 MG PO TABS: 10.0000 mg | ORAL_TABLET | Freq: Every day | ORAL | Status: DC

## 2021-06-13 MED ORDER — SODIUM CHLORIDE 0.9 % IV SOLN
500.0000 mg | INTRAVENOUS | Status: DC
  Administered 2021-06-13 – 2021-06-15 (×3): 500 mg via INTRAVENOUS
  Filled 2021-06-13 (×3): qty 5

## 2021-06-13 MED ORDER — LIVING BETTER WITH HEART FAILURE BOOK: Freq: Once | Status: AC

## 2021-06-13 MED ORDER — SERTRALINE HCL 50 MG PO TABS
100.0000 mg | ORAL_TABLET | Freq: Two times a day (BID) | ORAL | Status: DC
  Administered 2021-06-13 – 2021-06-18 (×11): 100 mg via ORAL
  Filled 2021-06-13 (×11): qty 2

## 2021-06-13 NOTE — Assessment & Plan Note (Signed)
-  With O2 sats documented down to 88% - Most likely secondary to community-acquired pneumonia - CT chest shows pneumonia and reactive tree-in-bud opacities -Continue oxygen supplement - Wean off as tolerated - Breathing treatments as needed - Continue to monitor

## 2021-06-13 NOTE — Progress Notes (Signed)
Patient Blood pressure is 136/40, hydralazine '100mg'$  order to be given. Notified Dr. Clearence Ped. Informed to hold the Hydralazine '100mg'$ . Will continue to monitor patient.

## 2021-06-13 NOTE — Assessment & Plan Note (Addendum)
-  Continue antihypertensive agents at adjusted dose; holding Cozaar and torsemide in the setting of worsening renal function. -Avoid hypotension. -Follow vital signs and further adjust antihypertensive agents as required. -Heart healthy diet discussed with patient.

## 2021-06-13 NOTE — Assessment & Plan Note (Signed)
Continue PPI ?

## 2021-06-13 NOTE — Progress Notes (Signed)
Patient seen and examined.  Admitted after midnight secondary to generalized weakness, mechanical falls at home, worsening renal function and work-up demonstrating acute right lower lobe pneumonia.  Fluid resuscitation, holding nephrotoxic agents, IV antibiotics, flutter valve, oxygen supplementation and bronchodilator management has been initiated.  Patient is hemodynamically stable currently.  Work-up and culture results pending at the time.  Please refer to H&P written by Dr. Carlynn Purl for further info/details on admission.  Plan: -Continue judicious fluid resuscitation -Avoid nephrotoxic agents, contrast and hypotension. -Continue current IV antibiotics, flutter valve and bronchodilator management. -Wean off oxygen supplementation as tolerated -Physical therapy have seen patient in evaluation with recommendations for skilled nursing facility at time of discharge for conditioning and rehabilitation.  TOC made aware to help with placement. -Continue supportive care and follow clinical response.  Barton Dubois MD [phone]

## 2021-06-13 NOTE — Progress Notes (Signed)
Pt was unable to void during the shift bladder scan showed greater than 500 mL.In and Out cath done, 600 mL removed from pt bladder

## 2021-06-13 NOTE — TOC Transition Note (Signed)
Transition of Care [facility]) - CM/SW Discharge Note   Patient Details  Name: Christy [name] MRN: [id number] Date of Birth: 03-13-1950  [facility] CM/SW Contact:  Boneta Lucks, RN Phone Number: 06/13/2021, 12:20 PM   Clinical Narrative:   Patient admitted with Acute Kidney injury. Patient has a high risk for readmission. Patient lives alone, drives herself to appointments, uses a cane or walker when needed. PT is recommending SNF. Patient is agreeable, wants [facility] or SNF in [place] if possible. FL2 completed and sent out for bed offers.  DC planning for Monday. TOC to follow.    Final next level of care: Skilled Nursing Facility Barriers to Discharge: Continued Medical Work up, SNF Pending bed offer  Patient Goals and CMS Choice Patient states their goals for this hospitalization and ongoing recovery are:: agreeable to SNF CMS Medicare.gov Compare Post Acute Care list provided to:: Patient Choice offered to / list presented to : Patient  Discharge Placement       Discharge Plan and Services   Discharge Planning Services: CM Consult    Readmission Risk Interventions    06/13/2021   12:19 PM  Readmission Risk Prevention Plan  Transportation Screening Complete  HRI or Home Care Consult Complete  Social Work Consult for [place] Planning/Counseling Complete  Palliative Care Screening Not Applicable  Medication Review [employer]) Complete

## 2021-06-13 NOTE — Assessment & Plan Note (Addendum)
-  History of anemia of chronic disease, patient receiving Epogen therapy at home. -Follow hemoglobin trend and transfuse as needed. -IV iron and Epogen therapy as per nephrology discretion. -Follow hemoglobin trend. -No overt bleeding appreciated or reported.

## 2021-06-13 NOTE — Assessment & Plan Note (Signed)
-  Likely multifactorial -CAP, anemia and worsening renal function with uremia presentation. -Physical therapy has seen patient and at this moment recommendations given for skilled nursing facility at discharge for further care and conditioning. -Will reevaluate once medically clear.

## 2021-06-13 NOTE — Assessment & Plan Note (Signed)
Continue statin. 

## 2021-06-13 NOTE — Assessment & Plan Note (Signed)
-  Continue sliding scale insulin and adjusted dose of long-acting. -Follow CBGs fluctuation. -Advised to maintain adequate oral hydration

## 2021-06-13 NOTE — Assessment & Plan Note (Signed)
-  baseline creatinine 1.6-1.7 -Creatinine bumped from 1.97--> 3.78>> 3.30>> 2.6>>2.05>>1.65 -multifactorial including dehydration/hypotension and continued use of nephrotoxic agents.  Possible component of mild ATN. -Patient with 2.9 L in the last 24 hours. - fluid resuscitation provided initially without significant improvement. -Patient with underlying history of grade 2 diastolic dysfunction -Renal ultrasound demonstrating no hydronephrosis or obstructive uropathy -Nephrology service has been consulted for further assistance regarding evaluation and management.  Will follow recommendations. -Continue avoiding hypotension, contrast and nephrotoxic agents. -Started back on Demadex with good response and no significant hypertension in renal function. -Per nephrology recommendations we will continue current dose of antihypertensive agents and hold the use of Cozaar. -Repeat renal function panel in AM.

## 2021-06-13 NOTE — NC FL2 (Signed)
[place] LEVEL OF CARE SCREENING TOOL     IDENTIFICATION  Patient Name: Christy [name] Birthdate: 05-08-1950 Sex: female Admission Date (Current Location): 06/12/2021  [facility] and [place] Number:  [employer] and Address:  [place] [address] [place]      Provider Number: [id number]  Attending Physician Name and Address:  Barton Dubois, MD  Relative Name and Phone Number:  Vinetta Bergamo - Daughter [phone]    Current Level of Care: Hospital Recommended Level of Care: [place] Prior Approval Number:    Date Approved/Denied:   PASRR Number: [id number]  Discharge Plan: SNF    Current Diagnoses: Patient Active Problem List   Diagnosis Date Noted   Diabetes mellitus type 2 in obese ([place]) 06/13/2021   Generalized weakness 06/13/2021   CAP (community acquired pneumonia) 06/13/2021   Acute respiratory failure with hypoxia ([place]) 06/13/2021   AKI (acute kidney injury) ([place]) 06/12/2021   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 03/20/2020   GERD (gastroesophageal reflux disease) 03/20/2020   Dysphagia 10/26/2019   Colon cancer screening 10/26/2019   Anemia 07/12/2018   MGUS (monoclonal gammopathy of unknown significance) 06/08/2018   Essential hypertension 04/10/2015   Abnormal myocardial perfusion study    SVT (supraventricular tachycardia) (HCC) 01/06/2012   Mixed hyperlipidemia 01/06/2012   Atypical chest pain    Chronic diastolic CHF (congestive heart failure) ([place]) 11/22/2009   OBSTRUCTIVE SLEEP APNEA 12/14/2007   CORONARY ATHEROSCLEROSIS NATIVE CORONARY ARTERY 12/14/2007    Orientation RESPIRATION BLADDER Height & Weight     Self, Time, Situation, Place  O2 (2L) External catheter, Continent Weight: 98.2 kg Height:  '5\' 3"'$  (160 cm)  BEHAVIORAL SYMPTOMS/MOOD NEUROLOGICAL BOWEL NUTRITION STATUS      Continent Diet (See DC summary)  AMBULATORY STATUS COMMUNICATION OF NEEDS Skin   Extensive Assist  Verbally Bruising, Skin abrasions                       Personal Care Assistance Level of Assistance  Bathing, Feeding, Dressing Bathing Assistance: Maximum assistance Feeding assistance: Limited assistance Dressing Assistance: Maximum assistance     Functional Limitations Info  Sight, Hearing, Speech Sight Info: Impaired Hearing Info: Adequate Speech Info: Adequate    SPECIAL CARE FACTORS FREQUENCY  PT (By licensed PT)     PT Frequency: 5 times a week              Contractures Contractures Info: Not present    Additional Factors Info  Code Status, Allergies Code Status Info: Full Allergies Info: Ace inhibitors, lisinopirl, Penicillin, tetracycline, lamictal           Current Medications (06/13/2021):  This is the current hospital active medication list Current Facility-Administered Medications  Medication Dose Route Frequency Provider Last Rate Last Admin   0.9 %  sodium chloride infusion   Intravenous Continuous Barton Dubois, MD 75 mL/hr at 06/13/21 0827 New Bag at 06/13/21 0827   acetaminophen (TYLENOL) tablet 650 mg  650 mg Oral Q6H PRN Zierle-Ghosh, Asia B, DO       Or   acetaminophen (TYLENOL) suppository 650 mg  650 mg Rectal Q6H PRN Zierle-Ghosh, Asia B, DO       albuterol (PROVENTIL) (2.5 MG/3ML) 0.083% nebulizer solution 2.5 mg  2.5 mg Nebulization Q2H PRN Zierle-Ghosh, Asia B, DO       amLODipine (NORVASC) tablet 5 mg  5 mg Oral Daily Barton Dubois, MD   5 mg at 06/13/21  8563   azithromycin (ZITHROMAX) 500 mg in sodium chloride 0.9 % 250 mL IVPB  500 mg Intravenous Q24H Zierle-Ghosh, Asia B, DO       buPROPion (WELLBUTRIN XL) 24 hr tablet 300 mg  300 mg Oral Daily Zierle-Ghosh, Asia B, DO   300 mg at 06/13/21 1497   calcitRIOL (ROCALTROL) capsule 0.25 mcg  0.25 mcg Oral Once per day on Mon Wed Fri Zierle-Ghosh, [place] B, DO   0.25 mcg at 06/13/21 0263   cariprazine (VRAYLAR) capsule 1.5 mg  1.5 mg Oral Daily Zierle-Ghosh, Asia B, DO   1.5 mg at  06/13/21 0825   carvedilol (COREG) tablet 12.5 mg  12.5 mg Oral BID WC Zierle-Ghosh, Asia B, DO   12.5 mg at 06/13/21 0824   cefTRIAXone (ROCEPHIN) 1 g in sodium chloride 0.9 % 100 mL IVPB  1 g Intravenous Q24H Zierle-Ghosh, Asia B, DO       divalproex (DEPAKOTE) DR tablet 250 mg  250 mg Oral Daily Zierle-Ghosh, Asia B, DO   250 mg at 06/13/21 0824   heparin injection 5,000 Units  5,000 Units Subcutaneous Q8H Zierle-Ghosh, Asia B, DO   5,000 Units at 06/13/21 1212   hydrALAZINE (APRESOLINE) tablet 100 mg  100 mg Oral Q8H Zierle-Ghosh, Asia B, DO       hydrOXYzine (ATARAX) tablet 50 mg  50 mg Oral TID PRN Thomes Lolling, RPH       insulin aspart (novoLOG) injection 0-15 Units  0-15 Units Subcutaneous TID WC Zierle-Ghosh, Asia B, DO   2 Units at 06/13/21 1213   insulin aspart (novoLOG) injection 0-5 Units  0-5 Units Subcutaneous QHS Zierle-Ghosh, Asia B, DO       insulin detemir (LEVEMIR) injection 20 Units  20 Units Subcutaneous QHS Zierle-Ghosh, Asia B, DO       isosorbide mononitrate (IMDUR) 24 hr tablet 60 mg  60 mg Oral Daily Zierle-Ghosh, Asia B, DO   60 mg at 06/13/21 7858   morphine (PF) 2 MG/ML injection 2 mg  2 mg Intravenous Q2H PRN Zierle-Ghosh, Asia B, DO       ondansetron (ZOFRAN) tablet 4 mg  4 mg Oral Q6H PRN Zierle-Ghosh, Asia B, DO       Or   ondansetron (ZOFRAN) injection 4 mg  4 mg Intravenous Q6H PRN Zierle-Ghosh, Asia B, DO       oxyCODONE (Oxy IR/ROXICODONE) immediate release tablet 5 mg  5 mg Oral Q4H PRN Zierle-Ghosh, Asia B, DO       pantoprazole (PROTONIX) EC tablet 40 mg  40 mg Oral Daily Zierle-Ghosh, Asia B, DO   40 mg at 06/13/21 0824   rosuvastatin (CRESTOR) tablet 10 mg  10 mg Oral q AM Zierle-Ghosh, Asia B, DO   10 mg at 06/13/21 8502   sertraline (ZOLOFT) tablet 100 mg  100 mg Oral BID Zierle-Ghosh, Asia B, DO   100 mg at 06/13/21 7741   traZODone (DESYREL) tablet 100 mg  100 mg Oral QHS Zierle-Ghosh, Asia B, DO         Discharge Medications: Please see  discharge summary for a list of discharge medications.  Relevant Imaging Results:  Relevant Lab Results:   Additional Information SS# [id number]  Boneta Lucks, RN

## 2021-06-13 NOTE — H&P (Signed)
History and Physical    Patient: Christy [name] GNF:[id number] DOB: 1950/06/27 DOA: 06/12/2021 DOS: the patient was seen and examined on 06/13/2021 PCP: Monico Blitz, MD  Patient coming from: Home  Chief Complaint:  Chief Complaint  Patient presents with   Abnormal Lab   HPI: Christy [name] is a 71 y.o. female with medical history significant of with history of diabetes mellitus type 2, grade 2 diastolic CHF, CAD, essential hypertension, mixed hyperlipidemia, OSA on CPAP, anemia, and more presents to ED with a chief complaint of generalized weakness and fatigue.  Patient reports that she has had 6-7 falls in the last week.  She says her family tells her she seems dizzy, but patient does not feel it.  She reports she did not hit her head or lose consciousness with any of these falls.  The falls occurred because she loses her balance.  This started happening 2 weeks ago.  She does not have chest pain palpitations or headache before the fall.  Patient is usually able to ambulate without an assistive device.  According to family patient was working up to 1 week ago, and then has not been able to work for the last week.  Patient reports that the symptoms were sudden in onset.  She denies fever, cough, but admits to generalized weakness and shortness of breath.  She especially had worse shortness of breath last night.  She reports it was laying down when it started.  Sitting up may have made it better, but she was scared to sit up because she loses her balance easily.  She had no change in her speech, no change in vision, no dysphagia, and reports no weakness on one side more than the other.  Physical exam does reveal subtle left lower extremity weakness compared to the right.  Patient denies facial droop.  She reports she does drool sometimes, it can happen at either corner of her mouth, and this has been happening for weeks.  Patient denies dysuria, hematuria, urgency, frequency.  She wears no O2 at  baseline.  Patient does wear CPAP at night.  She has no further complaints at this time.  Patient does not smoke, does not drink alcohol, does not use illicit drugs.  Patient is vaccinated for COVID.  Patient is full code. Review of Systems: As mentioned in the history of present illness. All other systems reviewed and are negative. Past Medical History:  Diagnosis Date   Arthritis    Bipolar disorder ([place])    CHF (congestive heart failure) ([place])    Coronary atherosclerosis of native coronary artery    BMS circ 2006, 70% PDA 2009   Essential hypertension    Mixed hyperlipidemia    OSA on CPAP    Renal tubular acidosis, type IV    SVT (supraventricular tachycardia) (HCC)    Type 2 diabetes mellitus ([place])    Past Surgical History:  Procedure Laterality Date   Acromioclavicular arthritis     BALLOON DILATION N/A 12/12/2019   Procedure: BALLOON DILATION;  Surgeon: [name], DO;  [facility];  Service: Endoscopy;  Laterality: N/A;   BALLOON DILATION N/A 06/25/2020   Procedure: BALLOON DILATION;  Surgeon: [name], DO;  [facility];  Service: Endoscopy;  Laterality: N/A;   BIOPSY  12/12/2019   Procedure: BIOPSY;  Surgeon: [name], DO;  [facility];  Service: Endoscopy;;   CARDIAC CATHETERIZATION N/A 03/29/2015   Procedure: Left Heart Cath and  Coronary Angiography;  Surgeon: Troy Sine, MD;  Location: [facility];  Service: Cardiovascular;  Laterality: N/A;   CATARACT EXTRACTION Bilateral    COLONOSCOPY WITH PROPOFOL N/A 12/12/2019   Procedure: COLONOSCOPY WITH PROPOFOL;  Surgeon: [name], DO;  [facility];  Service: Endoscopy;  Laterality: N/A;  2:30pm   ESOPHAGOGASTRODUODENOSCOPY (EGD) WITH PROPOFOL N/A 12/12/2019   Procedure: ESOPHAGOGASTRODUODENOSCOPY (EGD) WITH PROPOFOL;  Surgeon: [name], DO;  [facility];  Service: Endoscopy;  Laterality: N/A;    ESOPHAGOGASTRODUODENOSCOPY (EGD) WITH PROPOFOL N/A 06/25/2020   Procedure: ESOPHAGOGASTRODUODENOSCOPY (EGD) WITH PROPOFOL;  Surgeon: [name], DO;  [facility];  Service: Endoscopy;  Laterality: N/A;  1:45pm   POLYPECTOMY  12/12/2019   Procedure: POLYPECTOMY;  Surgeon: [name], DO;  [facility];  Service: Endoscopy;;   RIght shoulder adhesive capsulitis     Rotator cuff impingement syndrome Right    Rotator cuff tear     Social History:  reports that she quit smoking about 7 years ago. Her smoking use included cigarettes. She started smoking about 36 years ago. She has a 20.00 pack-year smoking history. She has never used smokeless tobacco. She reports that she does not drink alcohol and does not use drugs.  Allergies  Allergen Reactions   Ace Inhibitors Cough   Lisinopril Cough    Severe cough   Penicillins     Unknown, childhood reaction   Tetracycline Nausea Only   Lamictal [Lamotrigine] Rash    Rash start inside to outter body.    Family History  Problem Relation Age of Onset   Bipolar disorder Brother    Diabetes Father     Prior to Admission medications   Medication Sig Start Date End Date Taking? Authorizing Provider  aspirin 81 MG EC tablet Take 81 mg by mouth at bedtime.    Yes [provider]  epoetin alfa (EPOGEN) 3000 UNIT/ML injection Inject into the skin. 06/05/21  Yes [provider]  hydrOXYzine (VISTARIL) 50 MG capsule Take 50 mg by mouth 3 (three) times daily as needed for anxiety or itching. 04/21/21  Yes [provider]  TRESIBA FLEXTOUCH 200 UNIT/ML SOPN Inject 28 Units into the skin in the morning. 04/21/16  Yes [provider]  triamcinolone cream (KENALOG) 0.1 % apply cream TWICE DAILY AS NEEDED 04/09/21  Yes [provider]  allopurinol (ZYLOPRIM) 300 MG tablet Take 150 mg by mouth in the morning.    [provider]  amLODipine (NORVASC) 10 MG tablet Take 10 mg by  mouth daily. 06/02/21   [provider]  BESIVANCE 0.6 % SUSP Place 1 drop into the left eye See admin instructions. Instill one drop into the left eye four times daily for 2 days after each monthly injection Patient not taking: Reported on 06/11/2021 11/03/17   [provider]  buPROPion (WELLBUTRIN XL) 300 MG 24 hr tablet Take 300 mg by mouth in the morning. 11/18/09   [provider]  calcitRIOL (ROCALTROL) 0.25 MCG capsule Take 0.25 mcg by mouth 3 (three) times a week. 03/26/21   [provider]  calcium carbonate (OS-CAL) 600 MG TABS Take 600 mg by mouth daily.   Patient not taking: Reported on 06/11/2021    [provider]  cariprazine (VRAYLAR) 1.5 MG capsule Take by mouth.    [provider]  carvedilol (COREG) 12.5 MG tablet Take 12.5 mg by mouth 2 (two) times daily with  a meal.    [provider]  Cholecalciferol (VITAMIN D3) 25 MCG (1000 UT) CAPS Take 1,000 Units by mouth daily.  Patient not taking: Reported on 06/11/2021    [provider]  clotrimazole (LOTRIMIN) 1 % cream Apply 1 application topically daily as needed (itching). Patient not taking: Reported on 06/11/2021    [provider]  divalproex (DEPAKOTE) 250 MG EC tablet Take 250 mg by mouth in the morning.    [provider]  esomeprazole (NEXIUM) 40 MG capsule Take 1 capsule (40 mg total) by mouth 2 (two) times daily before a meal. 01/01/21 01/01/22  [name], DO  famotidine (PEPCID) 20 MG tablet Take by mouth. Patient not taking: Reported on 06/12/2021 06/07/21 06/22/21  [provider]  fluconazole (DIFLUCAN) 100 MG tablet Take 100 mg by mouth daily. 06/02/21   [provider]  GLOBAL EASE INJECT PEN NEEDLES 32G X 4 MM MISC See admin instructions. 06/28/19   [provider]  hydrALAZINE (APRESOLINE) 100 MG tablet Take by mouth. 03/27/21 03/27/22  [provider]  isosorbide mononitrate (IMDUR) 60 MG 24 hr  tablet Take 60 mg by mouth in the morning. 02/27/20   [provider]  losartan (COZAAR) 50 MG tablet Take 1 tablet by mouth daily. 11/15/20   [provider]  meclizine (ANTIVERT) 25 MG tablet Take 25 mg by mouth as needed for dizziness. Patient not taking: Reported on 06/11/2021 06/30/18   [provider]  metolazone (ZAROXOLYN) 5 MG tablet Take 5 mg by mouth daily as needed (fluid retention/shortness of breath). Patient not taking: Reported on 06/11/2021    [provider]  Multiple Vitamin (MULTIVITAMIN) tablet Take 1 tablet by mouth daily.   Patient not taking: Reported on 06/11/2021    [provider]  nitroGLYCERIN (NITROLINGUAL) 0.4 MG/SPRAY spray Place 1 spray under the tongue every 5 (five) minutes x 3 doses as needed. 01/09/14   Satira Sark, MD  NOVOLOG FLEXPEN 100 UNIT/ML FlexPen Inject 5-12 Units into the skin 3 (three) times daily as needed for high blood sugar. Patient not taking: Reported on 06/11/2021 08/25/19   [provider]  ondansetron (ZOFRAN-ODT) 4 MG disintegrating tablet Take by mouth. Patient not taking: Reported on 06/12/2021 06/07/21 06/14/21  [provider]  potassium chloride (KLOR-CON) 10 MEQ tablet Take 10 mEq by mouth daily as needed (when taking metolazone (fluid retention/shortness of breath)). Patient not taking: Reported on 06/11/2021 06/29/19   [provider]  rosuvastatin (CRESTOR) 10 MG tablet Take 10 mg by mouth in the morning.    [provider]  sertraline (ZOLOFT) 100 MG tablet Take 100 mg by mouth 2 (two) times daily.    [provider]  tiZANidine (ZANAFLEX) 4 MG tablet Take 4 mg by mouth every evening.    [provider]  torsemide (DEMADEX) 20 MG tablet Take 60 mg by mouth in the morning.     [provider]  traZODone (DESYREL) 100 MG tablet Take 100 mg by mouth at bedtime.  03/10/17   [provider]  VICTOZA 18 MG/3ML SOPN Inject 1.2 mg  into the skin as needed. Patient not taking: Reported on 06/11/2021 08/02/19   [provider]    Physical Exam: Vitals:   06/12/21 2019 06/12/21 2100 06/13/21 0017 06/13/21 0036  BP: (!) 134/45 (!) 125/36 (!) 117/55 (!) 136/41  Pulse: 62 62 (!) 59 (!) 59  Resp: (!) '25 19 18 18  '$ Temp:   98.3  F (36.8 C) 98.3 F (36.8 C)  TempSrc:      SpO2: 97% 99% 93% 100%  Weight:    98.2 kg  Height:    '5\' 3"'$  (1.6 m)   1.  General: Patient lying supine in bed,  no acute distress   2. Psychiatric: Alert and oriented x 3, mood and behavior normal for situation, flat affect, pleasant and cooperative with exam   3. Neurologic: Speech and language are normal, face is symmetric, moves all 4 extremities voluntarily, very subtle weakness in the left lower extremity compared to the right, but patient does not notice it -unknown chronicity, likely at baseline without acute deficits on limited exam   4. HEENMT:  Head is atraumatic, normocephalic, pupils reactive to light, neck is supple, trachea is midline, mucous membranes are moist   5. Respiratory : Lungs are clear to auscultation bilaterally without wheezing, rhonchi, rales, no cyanosis, no increase in work of breathing or accessory muscle use, nasal cannula in place   6. Cardiovascular : Heart rate normal, rhythm is regular, no murmurs, rubs or gallops, no peripheral edema, peripheral pulses palpated   7. Gastrointestinal:  Abdomen is soft, nondistended, nontender to palpation bowel sounds active, no masses or organomegaly palpated   8. Skin:  Skin is warm, dry and intact without rashes, acute lesions, or ulcers on limited exam   9.Musculoskeletal:  No acute deformities or trauma, no asymmetry in tone, no peripheral edema, peripheral pulses palpated, no tenderness to palpation in the extremities   Data Reviewed: In the ED Temp 97.5, heart rate 58-63, respiratory rate 15-25, blood pressure 125/36, satting 88-99% No leukocytosis with  a white blood cell count of 10.6, hemoglobin 7.9, platelets 196 Chemistry panel reveals elevated BUN at 99, elevated creatinine at 3.72 CT chest without contrast shows right lower lobe pneumonia.  Tree-in-bud opacities in the right upper lobe are likely infectious or inflammatory.  There is also an incidental 3.5 mm nodule. Chest x-ray shows cardiomegaly with vascular pulmonary congestion.  Possible edema.  PA and lateral views recommended.  CT chest was done instead of PA and lateral view. EKG shows a heart rate of 59, sinus bradycardia, QTc 485 with a left bundle.  EKG is very similar to previous.  Patient was started on Rocephin, Zithromax, normal saline 1 100 MLS per hour, NS bolus 500 Assessment and Plan: * AKI (acute kidney injury) ([place]) - Creatinine bumped from 1.97--> 3.72 -Concern for dehydration -500 mL bolus of normal saline followed by 100 MLS per hour normal saline started in the ED -Normal saline drip was discontinued at admission because of patient's grade 2 diastolic heart failure, attempt to avoid fluid overload --Creatinine in the a.m. and continue fluids if indicated at that time -Hold torsemide -Continue to monitor  Acute respiratory failure with hypoxia ([place]) -With O2 sats documented down to 88% - Most likely secondary to community-acquired pneumonia - CT chest shows pneumonia and reactive tree-in-bud opacities -Continue oxygen supplement - Wean off as tolerated - Breathing treatments as needed - Continue to monitor  CAP (community acquired pneumonia) - Chest x-ray had concern for pneumonia and then CT chest shows small amount of right lower lobe airspace disease worrisome for pneumonia -Baseline procalcitonin pending -Rocephin and Zithromax started in the ED, continue these antibiotics -Strep and Legionella urine antigens pending -Sputum culture pending -Patient's oxygen saturations dropped down to 88%, placed on 2 L nasal cannula Continue to  monitor  Generalized weakness - Likely multifactorial -CAP, anemia and Dehydration  Likely Contributing -Gentle hydration with history of grade 2 diastolic heart failure -Patient was given a bolus and started on 100 mL/h normal saline in the ED, these were discontinued at admission as we do not want to fluid overload her, but we will trend creatinine in the a.m. and assess if fluids are still indicated -Consult PT eval and treat -Continue to monitor  Diabetes mellitus type 2 in obese [facility]) - Patient takes 28 units of long-acting insulin at home -Reduce to 20 units of long-acting insulin while in the hospital -Sliding scale coverage -Carb modified diet -Continue to monitor  GERD (gastroesophageal reflux disease) Continue PPI  Anemia - History of anemia, on Epogen at home -Last hemoglobin 10.25 August 2020, 7.9 today -Baseline seems to be around 10 -Anemia panel, FOBT with next bowel movement -Trend hemoglobin in the a.m. -No signs or symptoms of active bleeding  Essential hypertension - Continue amlodipine, Coreg, hydralazine, losartan -BP in ED 125/36-well-controlled - Continue to monitor  Mixed hyperlipidemia - Continue statin  Chronic diastolic CHF (congestive heart failure) ([place]) - Patient has history of grade 2 diastolic heart failure -Last echo in 2020, update echo -There is concern on chest x-ray for possible edema.  2 view chest x-ray was recommended to follow-up, but CT chest was done and did not show edema, no significant peripheral edema, not likely an acute exacerbation -Continue medical optimization with aspirin, Coreg, Imdur, losartan, statin -Continue to monitor      Advance Care Planning:   Code Status: Full Code   Consults: None  Family Communication: No family at bedside  Severity of Illness: The appropriate patient status for this patient is INPATIENT. Inpatient status is judged to be reasonable and necessary in order to provide the required  intensity of service to ensure the patient's safety. The patient's presenting symptoms, physical exam findings, and initial radiographic and laboratory data in the context of their chronic comorbidities is felt to place them at high risk for further clinical deterioration. Furthermore, it is not anticipated that the patient will be medically stable for discharge from the hospital within 2 midnights of admission.   * I certify that at the point of admission it is my clinical judgment that the patient will require inpatient hospital care spanning beyond 2 midnights from the point of admission due to high intensity of service, high risk for further deterioration and high frequency of surveillance required.*  Author: Rolla Plate, DO 06/13/2021 4:31 AM  For on call review [email or web address].

## 2021-06-13 NOTE — Progress Notes (Signed)
  Echocardiogram 2D Echocardiogram has been performed.  Fidel Levy 06/13/2021, 12:10 PM

## 2021-06-13 NOTE — Assessment & Plan Note (Signed)
-  Chest x-ray had concern for pneumonia and then CT chest shows small amount of right lower lobe airspace disease worrisome for pneumonia as well. -Continue current antibiotic therapy; finalizing 50 days of antibiotic therapy.  No fever and normal WBCs.  Will conclude antibiotic therapy after today's dose. -Procalcitonin 0.13 -Follow culture results. -Continue the use of flutter valve and bronchodilators. -Currently off oxygen supplementation at rest; will get desaturation screening.

## 2021-06-13 NOTE — Plan of Care (Signed)
  Problem: Acute Rehab PT Goals(only PT should resolve) Goal: Pt Will Go Supine/Side To Sit Outcome: Progressing Flowsheets (Taken 06/13/2021 1115) Pt will go Supine/Side to Sit:  with supervision  with min guard assist Goal: Pt Will Go Sit To Supine/Side Outcome: Progressing Flowsheets (Taken 06/13/2021 1115) Pt will go Sit to Supine/Side:  with supervision  with min guard assist Goal: Patient Will Transfer Sit To/From Stand Outcome: Progressing Flowsheets (Taken 06/13/2021 1115) Patient will transfer sit to/from stand:  with min guard assist  with minimal assist Goal: Pt Will Transfer Bed To Chair/Chair To Bed Outcome: Progressing Flowsheets (Taken 06/13/2021 1115) Pt will Transfer Bed to Chair/Chair to Bed:  min guard assist  with min assist Goal: Pt Will Ambulate Outcome: Progressing Flowsheets (Taken 06/13/2021 1115) Pt will Ambulate:  25 feet  with minimal assist  with least restrictive assistive device Goal: Pt/caregiver will Perform Home Exercise Program Outcome: Progressing Flowsheets (Taken 06/13/2021 1115) Pt/caregiver will Perform Home Exercise Program:  For increased strengthening  For improved balance  Independently  11:16 AM, 06/13/21 Mearl Latin PT, DPT Physical Therapist at [facility]

## 2021-06-13 NOTE — Assessment & Plan Note (Addendum)
-  Patient has history of grade 2 diastolic heart failure -Echo during this admission demonstrating grade 2 diastolic dysfunction with preserved ejection fraction and no significant valvular disorder. -Continue the use of Coreg, hydralazine and Imdur at adjusted dose. -Continue holding diuretics due to acute on chronic renal failure. -Follow daily weights, strict I's and O's and low-sodium diet. -Demonstrating fluid overload on physical examination.

## 2021-06-13 NOTE — Evaluation (Signed)
Physical Therapy Evaluation Patient Details Name: Christy [name] MRN: [id number] DOB: 05-28-1950 Today's Date: 06/13/2021  History of Present Illness  Christy [name] is a 71 y.o. female with medical history significant of with history of diabetes mellitus type 2, grade 2 diastolic CHF, CAD, essential hypertension, mixed hyperlipidemia, OSA on CPAP, anemia, and more presents to ED with a chief complaint of generalized weakness and fatigue.  Patient reports that she has had 6-7 falls in the last week.  She says her family tells her she seems dizzy, but patient does not feel it.  She reports she did not hit her head or lose consciousness with any of these falls.  The falls occurred because she loses her balance.  This started happening 2 weeks ago.  She does not have chest pain palpitations or headache before the fall.  Patient is usually able to ambulate without an assistive device.  According to family patient was working up to 1 week ago, and then has not been able to work for the last week.  Patient reports that the symptoms were sudden in onset.  She denies fever, cough, but admits to generalized weakness and shortness of breath.  She especially had worse shortness of breath last night.  She reports it was laying down when it started.  Sitting up may have made it better, but she was scared to sit up because she loses her balance easily.  She had no change in her speech, no change in vision, no dysphagia, and reports no weakness on one side more than the other.  Physical exam does reveal subtle left lower extremity weakness compared to the right.  Patient denies facial droop.  She reports she does drool sometimes, it can happen at either corner of her mouth, and this has been happening for weeks.  Patient denies dysuria, hematuria, urgency, frequency.  She wears no O2 at baseline.  Patient does wear CPAP at night.  She has no further complaints at this time.   Clinical Impression  Patient limited for  functional mobility as stated below secondary to BLE weakness, fatigue and impaired standing balance. Patient requires assist to pull to seated EOB with HOB elevated due to generalized weakness and completes with labored movements. She demonstrates fair sitting tolerance and balance EOB. She requires assist to transfer to standing with RW due to weakness and is limited to several steps at bedside by fatigue. Patient assisted back to bed at end of session. Patient will benefit from continued physical therapy in hospital and recommended venue below to increase strength, balance, endurance for safe ADLs and gait.        Recommendations for follow up therapy are one component of a multi-disciplinary discharge planning process, led by the attending physician.  Recommendations may be updated based on patient status, additional functional criteria and insurance authorization.  Follow Up Recommendations Skilled nursing-short term rehab (<3 hours/day)    Assistance Recommended at Discharge Intermittent Supervision/Assistance  Patient can return home with the following  A little help with walking and/or transfers;A little help with bathing/dressing/bathroom;Assistance with cooking/housework;Help with stairs or ramp for entrance    Equipment Recommendations None recommended by PT  Recommendations for Other Services       Functional Status Assessment Patient has had a recent decline in their functional status and demonstrates the ability to make significant improvements in function in a reasonable and predictable amount of time.     Precautions / Restrictions Precautions Precautions: Fall Restrictions Weight Bearing Restrictions: No  Mobility  Bed Mobility Overal bed mobility: Needs Assistance Bed Mobility: Supine to Sit, Sit to Supine     Supine to sit: Min assist, HOB elevated Sit to supine: Min assist, Mod assist   General bed mobility comments: assist to pull to seated EOB, cueing  for scooting; assist for weakness to get LE back into bed and for positioning    Transfers Overall transfer level: Needs assistance Equipment used: Rolling walker (2 wheels) Transfers: Sit to/from Stand Sit to Stand: Mod assist           General transfer comment: assist to power up due to weakness    Ambulation/Gait Ambulation/Gait assistance: Min assist Gait Distance (Feet): 2 Feet Assistive device: Rolling walker (2 wheels)   Gait velocity: decreased     General Gait Details: limited to a few lateral steps at bedside  Stairs            Wheelchair Mobility    Modified Rankin (Stroke Patients Only)       Balance Overall balance assessment: Needs assistance Sitting-balance support: No upper extremity supported, Feet supported Sitting balance-Leahy Scale: Fair Sitting balance - Comments: good/fair seated EOB   Standing balance support: Bilateral upper extremity supported Standing balance-Leahy Scale: Fair Standing balance comment: with RW                             Pertinent Vitals/Pain Pain Assessment Pain Assessment: No/denies pain    Home Living Family/patient expects to be discharged to:: Private residence Living Arrangements: Alone   Type of Home: House Home Access: Stairs to enter Entrance Stairs-Rails: Psychiatric nurse of Steps: 6-8   Home Layout: One level Home Equipment: Cane - single Barista (2 wheels)      Prior Function Prior Level of Function : Independent/Modified Independent             Mobility Comments: household ambulation with intermittent SPC use ADLs Comments: Independent until last week     Hand Dominance        Extremity/Trunk Assessment   Upper Extremity Assessment Upper Extremity Assessment: Generalized weakness    Lower Extremity Assessment Lower Extremity Assessment: Generalized weakness    Cervical / Trunk Assessment Cervical / Trunk Assessment: Normal   Communication   Communication: No difficulties  Cognition Arousal/Alertness: Awake/alert Behavior During Therapy: WFL for tasks assessed/performed Overall Cognitive Status: Within Functional Limits for tasks assessed                                          General Comments      Exercises     Assessment/Plan    PT Assessment Patient needs continued PT services  PT Problem List Decreased strength;Decreased mobility;Decreased activity tolerance;Decreased balance       PT Treatment Interventions DME instruction;Therapeutic exercise;Gait training;Balance training;Stair training;Neuromuscular re-education;Functional mobility training;Therapeutic activities;Patient/family education    PT Goals (Current goals can be found in the Care Plan section)  Acute Rehab PT Goals Patient Stated Goal: Return home PT Goal Formulation: With patient Time For Goal Achievement: 06/27/21 Potential to Achieve Goals: Good    Frequency Min 3X/week     Co-evaluation               AM-PAC PT "6 Clicks" Mobility  Outcome Measure Help needed turning from your back to your side while in a flat  bed without using bedrails?: A Little Help needed moving from lying on your back to sitting on the side of a flat bed without using bedrails?: A Little Help needed moving to and from a bed to a chair (including a wheelchair)?: A Lot Help needed standing up from a chair using your arms (e.g., wheelchair or bedside chair)?: A Lot Help needed to walk in hospital room?: A Lot Help needed climbing 3-5 steps with a railing? : A Lot 6 Click Score: 14    End of Session Equipment Utilized During Treatment: Gait belt;Oxygen Activity Tolerance: Patient limited by fatigue Patient left: in bed;with call bell/phone within reach;with bed alarm set Nurse Communication: Mobility status PT Visit Diagnosis: Unsteadiness on feet (R26.81);Other abnormalities of gait and mobility (R26.89);Muscle weakness  (generalized) (M62.81)    Time: 1010-1026 PT Time Calculation (min) (ACUTE ONLY): 16 min   Charges:   PT Evaluation $PT Eval Low Complexity: 1 Low PT Treatments $Therapeutic Activity: 8-22 mins        11:14 AM, 06/13/21 Mearl Latin PT, DPT Physical Therapist at [facility]

## 2021-06-14 ENCOUNTER — Inpatient Hospital Stay (HOSPITAL_COMMUNITY): Payer: Medicare Other

## 2021-06-14 DIAGNOSIS — J189 Pneumonia, unspecified organism: Secondary | ICD-10-CM | POA: Diagnosis not present

## 2021-06-14 DIAGNOSIS — N184 Chronic kidney disease, stage 4 (severe): Secondary | ICD-10-CM

## 2021-06-14 DIAGNOSIS — D631 Anemia in chronic kidney disease: Secondary | ICD-10-CM

## 2021-06-14 DIAGNOSIS — E782 Mixed hyperlipidemia: Secondary | ICD-10-CM

## 2021-06-14 DIAGNOSIS — I5032 Chronic diastolic (congestive) heart failure: Secondary | ICD-10-CM | POA: Diagnosis not present

## 2021-06-14 DIAGNOSIS — J9601 Acute respiratory failure with hypoxia: Secondary | ICD-10-CM | POA: Diagnosis not present

## 2021-06-14 DIAGNOSIS — R531 Weakness: Secondary | ICD-10-CM

## 2021-06-14 DIAGNOSIS — N179 Acute kidney failure, unspecified: Secondary | ICD-10-CM | POA: Diagnosis not present

## 2021-06-14 LAB — GLUCOSE, CAPILLARY
Glucose-Capillary: 118 mg/dL — ABNORMAL HIGH (ref 70–99)
Glucose-Capillary: 159 mg/dL — ABNORMAL HIGH (ref 70–99)
Glucose-Capillary: 151 mg/dL — ABNORMAL HIGH (ref 70–99)
Glucose-Capillary: 110 mg/dL — ABNORMAL HIGH (ref 70–99)

## 2021-06-14 LAB — BASIC METABOLIC PANEL
Anion gap: 7 (ref 5–15)
BUN: 106 mg/dL — ABNORMAL HIGH (ref 8–23)
CO2: 21 mmol/L — ABNORMAL LOW (ref 22–32)
Calcium: 8.3 mg/dL — ABNORMAL LOW (ref 8.9–10.3)
Chloride: 113 mmol/L — ABNORMAL HIGH (ref 98–111)
Creatinine, Ser: 3.78 mg/dL — ABNORMAL HIGH (ref 0.44–1.00)
GFR, Estimated: 12 mL/min — ABNORMAL LOW (ref >60)
Glucose, Bld: 120 mg/dL — ABNORMAL HIGH (ref 70–99)
Potassium: 4.1 mmol/L (ref 3.5–5.1)
Sodium: 141 mmol/L (ref 135–145)

## 2021-06-14 MED ORDER — SODIUM CHLORIDE 0.9 % IV SOLN: INTRAVENOUS | Status: DC

## 2021-06-14 NOTE — Progress Notes (Signed)
I was asked to see Christy [name] for renal consult--she has many medical issues including some baseline CKD-  crt 1.5 to 1.7-  she presents with weakness and what sounds like orthostasis on many BP meds and now a crt of 3.7 which has not improved-  she is non oliguric-  had some urinary retention this AM U/S being done to rule out hydro-  urine is bland which puts [place] in a pre or post renal category-  I see that her ARB has been held and other BP meds decreased appropriately and diuretics held-  I would not give fluid back as it does not seem she is dry- I think BP just got too low which gave her some A on CRF  I will do full consult tomorrow and possibly resume diuretics   Louis Meckel

## 2021-06-14 NOTE — Progress Notes (Signed)
Progress Note   Patient: Christy [name] XKG:[id number] DOB: October 27, 1950 DOA: 06/12/2021     2 DOS: the patient was seen and examined on 06/14/2021   Brief hospital course: As per H&P written by Dr. Clearence Ped On 06/13/2021 Christy [name] is a 71 y.o. female with medical history significant of with history of diabetes mellitus type 2, grade 2 diastolic CHF, CAD, essential hypertension, mixed hyperlipidemia, OSA on CPAP, anemia, and more presents to ED with a chief complaint of generalized weakness and fatigue.  Patient reports that she has had 6-7 falls in the last week.  She says her family tells her she seems dizzy, but patient does not feel it.  She reports she did not hit her head or lose consciousness with any of these falls.  The falls occurred because she loses her balance.  This started happening 2 weeks ago.  She does not have chest pain palpitations or headache before the fall.  Patient is usually able to ambulate without an assistive device.  According to family patient was working up to 1 week ago, and then has not been able to work for the last week.  Patient reports that the symptoms were sudden in onset.  She denies fever, cough, but admits to generalized weakness and shortness of breath.  She especially had worse shortness of breath last night.  She reports it was laying down when it started.  Sitting up may have made it better, but she was scared to sit up because she loses her balance easily.  She had no change in her speech, no change in vision, no dysphagia, and reports no weakness on one side more than the other.  Physical exam does reveal subtle left lower extremity weakness compared to the right.  Patient denies facial droop.  She reports she does drool sometimes, it can happen at either corner of her mouth, and this has been happening for weeks.  Patient denies dysuria, hematuria, urgency, frequency.  She wears no O2 at baseline.  Patient does wear CPAP at night.  She has no further  complaints at this time.  Patient does not smoke, does not drink alcohol, does not use illicit drugs.  Patient is vaccinated for COVID.  Patient is full code.  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 -Creatinine bumped from 1.97--> 3.78 -Concern for prerenal azotemia, dehydration/hypotension and continued use of nephrotoxic agents. - fluid resuscitation provided without significant improvement. -Patient with underlying history of grade 2 diastolic dysfunction limiting IV fluids strategies. -Renal ultrasound demonstrating no hydronephrosis or obstructive uropathy -Nephrology service has been consulted for further assistance regarding evaluation and management.  Patient BUN up to 106 with concerns for uremic development. -Avoid hypotension, contrast and nephrotoxic agents.  Acute respiratory failure with hypoxia (HCC) -With O2 sats documented down to 88% on room air -Stable with 2 L supplementation -CT chest demonstrating concern for pneumonia -Continue IV antibiotics, supportive care and wean off oxygen supplementation as tolerated.   CAP (community acquired pneumonia) - Chest x-ray had concern for pneumonia and then CT chest shows small amount of right lower lobe airspace disease worrisome for pneumonia as well. -Continue current antibiotic therapy -Procalcitonin 0.13 -Follow culture results. -Continue the use of flutter valve and bronchodilators. -Wean off oxygen supplementation as tolerated; currently on 2 L nasal cannula.  Patient did not use oxygen at baseline.  Generalized weakness -Likely multifactorial -CAP, anemia and worsening  renal function with uremia presentation. -Physical therapy has seen patient and at this moment recommendations given for skilled nursing facility at discharge for further care and conditioning. -Will reevaluate once medically clear.  Diabetes  mellitus type 2 in obese (HCC) -Continue sliding scale insulin and adjusted dose of long-acting. -Follow CBGs fluctuation. -Advised to maintain adequate oral hydration  GERD (gastroesophageal reflux disease) -Continue PPI  Anemia -History of anemia of chronic disease, on Epogen at home -No signs of overt bleeding -Follow hemoglobin trend and transfuse as needed.  Essential hypertension -Continue antihypertensive agents at adjusted dose; holding Cozaar and torsemide in the setting of worsening renal function. -Follow vital signs. -Avoid hypotension.  Mixed hyperlipidemia -Continue statin  Chronic diastolic CHF (congestive heart failure) ([place]) - Patient has history of grade 2 diastolic heart failure -Echo demonstrating grade 2 diastolic dysfunction with preserved ejection fraction and no significant valvular disorder. -Continue the use of Coreg, hydralazine and Imdur and adjusted dose. -Holding diuretics due to acute on chronic renal failure. -Follow daily weights, strict I's and O's and low-sodium diet.  Morbid obesity -Body mass index is 38.27 kg/m. -Low calorie diet, portion control and increase physical activity discussed with patient.   Subjective:  Feeling weak, tired and deconditioned; denies chest pain, no nausea or vomiting currently.  Patient has become oliguric and this morning unable to urinate on her own requiring in and out cath.  Physical Exam: Vitals:   06/14/21 0500 06/14/21 0807 06/14/21 1206 06/14/21 1300  BP:   (!) 130/45 (!) 123/43  Pulse:  (!) 58  (!) 52  Resp:    20  Temp:    98.5 F (36.9 C)  TempSrc:    Oral  SpO2:    97%  Weight: 98 kg     Height:       General exam: Alert, awake, oriented x 3; positive axillary cyst appreciated on examination; patient reports no chest pain, palpitations, nausea or vomiting.  Breathing is slightly better.  Oxygen supplementation in place. Respiratory system: Positive rhonchi bilaterally; no wheezing, no using  accessory muscle. Cardiovascular system:RRR.  Positive systolic murmur, no rubs, no gallops, no JVD. Gastrointestinal system: Abdomen is obese, nondistended, soft and nontender. No organomegaly or masses felt. Normal bowel sounds heard. Central nervous system: Alert and oriented.  Found with preserved muscle strength bilaterally upper and lower extremities.  Demonstrating some dysmetria and lack of coordination.  Otherwise no focal deficit. Extremities: No cyanosis or clubbing. Skin: No petechiae. Psychiatry: Judgement and insight appear normal. Mood & affect appropriate.   Data Reviewed: Renal ultrasound demonstrating no hydronephrosis or obstructive uropathy; normal bladder. Basic metabolic panel demonstrating a sodium of 141, potassium 4.1, chloride 113, BUN 106, creatinine 3.78 and a GFR of 12; bicarb 21 and anion gap is 7  Family Communication: Daughter at bedside.  Disposition: Status is: Inpatient Remains inpatient appropriate because: Continued to demonstrate worsening offered renal function and BUN; oliguric.  Requiring further work-up and treatment to stabilize renal function.  Patient facing possible need of hemodialysis.   Planned Discharge Destination: Home with Home Health versus skilled nursing facility.   Author: Barton Dubois, MD 06/14/2021 6:33 PM  For on call review [email or web address].

## 2021-06-15 DIAGNOSIS — N179 Acute kidney failure, unspecified: Secondary | ICD-10-CM | POA: Diagnosis not present

## 2021-06-15 DIAGNOSIS — I5032 Chronic diastolic (congestive) heart failure: Secondary | ICD-10-CM | POA: Diagnosis not present

## 2021-06-15 DIAGNOSIS — J189 Pneumonia, unspecified organism: Secondary | ICD-10-CM | POA: Diagnosis not present

## 2021-06-15 DIAGNOSIS — J9601 Acute respiratory failure with hypoxia: Secondary | ICD-10-CM | POA: Diagnosis not present

## 2021-06-15 LAB — GLUCOSE, CAPILLARY
Glucose-Capillary: 110 mg/dL — ABNORMAL HIGH (ref 70–99)
Glucose-Capillary: 168 mg/dL — ABNORMAL HIGH (ref 70–99)
Glucose-Capillary: 164 mg/dL — ABNORMAL HIGH (ref 70–99)
Glucose-Capillary: 144 mg/dL — ABNORMAL HIGH (ref 70–99)

## 2021-06-15 LAB — RENAL FUNCTION PANEL
Albumin: 2.8 g/dL — ABNORMAL LOW (ref 3.5–5.0)
Anion gap: 8 (ref 5–15)
BUN: 106 mg/dL — ABNORMAL HIGH (ref 8–23)
CO2: 20 mmol/L — ABNORMAL LOW (ref 22–32)
Calcium: 7.9 mg/dL — ABNORMAL LOW (ref 8.9–10.3)
Chloride: 112 mmol/L — ABNORMAL HIGH (ref 98–111)
Creatinine, Ser: 3.3 mg/dL — ABNORMAL HIGH (ref 0.44–1.00)
GFR, Estimated: 14 mL/min — ABNORMAL LOW (ref >60)
Glucose, Bld: 105 mg/dL — ABNORMAL HIGH (ref 70–99)
Phosphorus: 5.8 mg/dL — ABNORMAL HIGH (ref 2.5–4.6)
Potassium: 3.9 mmol/L (ref 3.5–5.1)
Sodium: 140 mmol/L (ref 135–145)

## 2021-06-15 MED ORDER — SODIUM CHLORIDE 0.9 % IV SOLN
250.0000 mg | Freq: Every day | INTRAVENOUS | Status: AC
  Administered 2021-06-15 – 2021-06-18 (×4): 250 mg via INTRAVENOUS
  Filled 2021-06-15 (×4): qty 20

## 2021-06-15 MED ORDER — DARBEPOETIN ALFA 200 MCG/0.4ML IJ SOSY
200.0000 ug | PREFILLED_SYRINGE | INTRAMUSCULAR | Status: DC
  Administered 2021-06-15: 200 ug via SUBCUTANEOUS
  Filled 2021-06-15: qty 0.4

## 2021-06-15 NOTE — Consult Note (Signed)
[facility] Renal Consultation Note  Requesting MD: Dyann Kief Indication for Consultation: A on CRF  HPI:  Christy [name] is a 71 y.o. female with a past medical history significant for type 2 diabetes mellitus, CAD, grade 2 diastolic heart failure, OSA on CPAP, also has an O2 requirement at baseline.  She presented to the hospital on 5/26 with weakness and falls.  Some of her complaints were suspicious for orthostasis and possibly volume overload with shortness of breath.  Kidney wise, patient seems to have a history of renal insufficiency dating back to 2009.  Creatinine ranging 1.3-1.5 until early 2019.  From 2019 to present, creatinine more like 1.7-1.9.  Creatinine was 1.97 on 03/24/2021.  When she presented with these complaints of weakness on 5/25 creatinine was noted to be 3.7.  Urinalysis only showed 30 of protein and no cells.  Renal ultrasound looks fairly normal with 11.2 to 11.3 cm kidneys.  Therefore, this puts [place] in a pre or post renal situation.  There is a note from the MA yesterday that bladder scan was positive and an in and out catheterization had to be done.  Urine output does seem to be a little bit down-600 to 800 cc/day.  When patient initially presented blood pressure was 110/51 although it has not been that low since.  No orthostatics have been recorded.  Of note, her home medication list includes Norvasc 10, Coreg 12.5 twice daily, hydralazine 100 3 times daily, Imdur 60, Cozaar 50 daily, torsemide 60 daily-she also appears to be on a muscle relaxer, Vistaril.  She is also on renal specific medicines Rocaltrol and Epogen.  She is reportedly followed by Dr. Theador Hawthorne for her CKD.  When patient was admitted-Cozaar was held, hydralazine and amlodipine were decreased and diuretics were held.  Creatinine peaked at 3.7 and is noted to be 3.3 today.  She appears to be tremulous.  Says maybe she feels a little better since being here.  Pure wick is in place  Creatinine, Ser   Date/Time Value Ref Range Status  06/15/2021 07:35 AM 3.30 (H) 0.44 - 1.00 mg/dL Final  06/14/2021 05:04 AM 3.78 (H) 0.44 - 1.00 mg/dL Final  06/13/2021 04:45 AM 3.63 (H) 0.44 - 1.00 mg/dL Final  06/12/2021 05:38 PM 3.72 (H) 0.44 - 1.00 mg/dL Final  03/24/2021 01:25 PM 1.97 (H) 0.44 - 1.00 mg/dL Final  09/10/2020 12:16 PM 1.70 (H) 0.44 - 1.00 mg/dL Final  06/19/2020 10:59 AM 1.58 (H) 0.44 - 1.00 mg/dL Final  01/25/2020 01:33 PM 1.10 (H) 0.44 - 1.00 mg/dL Final  09/07/2019 01:42 PM 1.77 (H) 0.44 - 1.00 mg/dL Final  06/23/2019 11:30 AM 2.07 (H) 0.44 - 1.00 mg/dL Final  02/02/2019 12:22 PM 1.75 (H) 0.44 - 1.00 mg/dL Final  11/08/2018 02:05 PM 1.90 (H) 0.44 - 1.00 mg/dL Final  07/05/2018 01:30 PM 1.72 (H) 0.44 - 1.00 mg/dL Final  06/01/2018 03:22 PM 1.67 (H) 0.44 - 1.00 mg/dL Final  11/24/2017 01:09 PM 1.76 (H) 0.44 - 1.00 mg/dL Final  05/03/2017 12:44 PM 1.34 (H) 0.44 - 1.00 mg/dL Final  10/30/2016 01:43 PM 1.53 (H) 0.44 - 1.00 mg/dL Final  05/07/2016 10:15 AM 1.47 (H) 0.44 - 1.00 mg/dL Final  09/25/2015 01:09 PM 1.45 (H) 0.44 - 1.00 mg/dL Final  03/29/2015 07:42 AM 1.35 (H) 0.44 - 1.00 mg/dL Final  02/27/2008 12:00 AM 1.1 mg/dL   12/29/2007 05:57 AM 1.37 (H) 0.4 - 1.2 mg/dL Final  12/28/2007 04:45 AM 1.36 (H) 0.4 - 1.2 mg/dL  Final  12/27/2007 04:30 AM 1.46 (H) 0.4 - 1.2 mg/dL Final  12/26/2007 05:58 AM 1.36 (H) 0.4 - 1.2 mg/dL Final  12/25/2007 03:25 AM 1.63 (H) 0.4 - 1.2 mg/dL Final  12/23/2007 08:09 PM 1.21 (H) 0.4 - 1.2 mg/dL Final  10/27/2006 04:20 AM 1.02  Final     PMHx:   Past Medical History:  Diagnosis Date   Arthritis    Bipolar disorder ([place])    CHF (congestive heart failure) ([place])    Coronary atherosclerosis of native coronary artery    BMS circ 2006, 70% PDA 2009   Essential hypertension    Mixed hyperlipidemia    OSA on CPAP    Renal tubular acidosis, type IV    SVT (supraventricular tachycardia) ([place])    Type 2 diabetes mellitus ([place])     Past Surgical  History:  Procedure Laterality Date   Acromioclavicular arthritis     BALLOON DILATION N/A 12/12/2019   Procedure: BALLOON DILATION;  Surgeon: [name], DO;  [facility];  Service: Endoscopy;  Laterality: N/A;   BALLOON DILATION N/A 06/25/2020   Procedure: BALLOON DILATION;  Surgeon: [name], DO;  [facility];  Service: Endoscopy;  Laterality: N/A;   BIOPSY  12/12/2019   Procedure: BIOPSY;  Surgeon: [name], DO;  [facility];  Service: Endoscopy;;   CARDIAC CATHETERIZATION N/A 03/29/2015   Procedure: Left Heart Cath and Coronary Angiography;  Surgeon: Troy Sine, MD;  Location: [facility];  Service: Cardiovascular;  Laterality: N/A;   CATARACT EXTRACTION Bilateral    COLONOSCOPY WITH PROPOFOL N/A 12/12/2019   Procedure: COLONOSCOPY WITH PROPOFOL;  Surgeon: [name], DO;  [facility];  Service: Endoscopy;  Laterality: N/A;  2:30pm   ESOPHAGOGASTRODUODENOSCOPY (EGD) WITH PROPOFOL N/A 12/12/2019   Procedure: ESOPHAGOGASTRODUODENOSCOPY (EGD) WITH PROPOFOL;  Surgeon: [name], DO;  [facility];  Service: Endoscopy;  Laterality: N/A;   ESOPHAGOGASTRODUODENOSCOPY (EGD) WITH PROPOFOL N/A 06/25/2020   Procedure: ESOPHAGOGASTRODUODENOSCOPY (EGD) WITH PROPOFOL;  Surgeon: [name], DO;  [facility];  Service: Endoscopy;  Laterality: N/A;  1:45pm   POLYPECTOMY  12/12/2019   Procedure: POLYPECTOMY;  Surgeon: [name], DO;  [facility];  Service: Endoscopy;;   RIght shoulder adhesive capsulitis     Rotator cuff impingement syndrome Right    Rotator cuff tear      Family Hx:  Family History  Problem Relation Age of Onset   Bipolar disorder Brother    Diabetes Father     Social History:  reports that she quit smoking about 7 years ago. Her smoking use included cigarettes. She started smoking about 36 years ago. She has a 20.00 pack-year smoking  history. She has never used smokeless tobacco. She reports that she does not drink alcohol and does not use drugs.  Allergies:  Allergies  Allergen Reactions   Ace Inhibitors Cough   Lisinopril Cough    Severe cough   Penicillins     Unknown, childhood reaction   Tetracycline Nausea Only   Lamictal [Lamotrigine] Rash    Rash start inside to outter body.    Medications: Prior to Admission medications   Medication Sig Start Date End Date Taking? Authorizing Provider  allopurinol (ZYLOPRIM) 300 MG tablet Take 150 mg by mouth in the morning.   Yes [provider]  amLODipine (NORVASC) 10 MG tablet Take 10 mg by mouth daily. 06/02/21  Yes [provider]  aspirin 81 MG EC tablet Take 81 mg by mouth at bedtime.    Yes [provider]  buPROPion (WELLBUTRIN XL) 300 MG 24 hr tablet Take 300 mg by mouth in the morning. 11/18/09  Yes [provider]  calcitRIOL (ROCALTROL) 0.25 MCG capsule Take 0.25 mcg by mouth 3 (three) times a week. 03/26/21  Yes [provider]  carvedilol (COREG) 12.5 MG tablet Take 12.5 mg by mouth 2 (two) times daily with a meal.   Yes [provider]  divalproex (DEPAKOTE) 250 MG EC tablet Take 250 mg by mouth in the morning.   Yes [provider]  epoetin alfa (EPOGEN) 3000 UNIT/ML injection Inject into the skin. 06/05/21  Yes [provider]  esomeprazole (NEXIUM) 40 MG capsule Take 1 capsule (40 mg total) by mouth 2 (two) times daily before a meal. 01/01/21 01/01/22 Yes Carver, Charles K, DO  fluconazole (DIFLUCAN) 100 MG tablet Take 100 mg by mouth daily. 06/02/21  Yes [provider]  hydrALAZINE (APRESOLINE) 100 MG tablet Take 100 mg by mouth 3 (three) times daily. 03/27/21 03/27/22 Yes [provider]  hydrOXYzine (VISTARIL) 50 MG capsule Take 50 mg by mouth 3 (three) times daily as needed for anxiety or itching. 04/21/21  Yes [provider]  isosorbide mononitrate (IMDUR) 60  MG 24 hr tablet Take 60 mg by mouth in the morning. 02/27/20  Yes [provider]  losartan (COZAAR) 50 MG tablet Take 1 tablet by mouth daily. 11/15/20  Yes [provider]  nitroGLYCERIN (NITROLINGUAL) 0.4 MG/SPRAY spray Place 1 spray under the tongue every 5 (five) minutes x 3 doses as needed. 01/09/14  Yes Satira Sark, MD  rosuvastatin (CRESTOR) 10 MG tablet Take 10 mg by mouth in the morning.   Yes [provider]  sertraline (ZOLOFT) 100 MG tablet Take 100 mg by mouth 2 (two) times daily.   Yes [provider]  tiZANidine (ZANAFLEX) 4 MG tablet Take 4 mg by mouth every evening.   Yes [provider]  torsemide (DEMADEX) 20 MG tablet Take 60 mg by mouth in the morning.    Yes [provider]  traZODone (DESYREL) 100 MG tablet Take 100 mg by mouth at bedtime.  03/10/17  Yes [provider]  TRESIBA FLEXTOUCH 200 UNIT/ML SOPN Inject 28 Units into the skin in the morning. 04/21/16  Yes [provider]  GLOBAL EASE INJECT PEN NEEDLES 32G X 4 MM MISC See admin instructions. 06/28/19   [provider]    I have reviewed the patient's current medications.  Labs:  Results for orders placed or performed during the hospital encounter of 06/12/21 (from the past 48 hour(s))  Glucose, capillary     Status: Abnormal   Collection Time: 06/13/21  4:23 PM  Result Value Ref Range   Glucose-Capillary 149 (H) 70 - 99 mg/dL    Comment: Glucose reference range applies only to samples taken after fasting for at least 8 hours.  Glucose, capillary     Status: Abnormal   Collection Time: 06/13/21  8:33 PM  Result Value Ref Range   Glucose-Capillary 166 (H) 70 - 99 mg/dL    Comment: Glucose reference range applies only to samples taken after fasting for at least 8 hours.  Basic metabolic panel     Status: Abnormal   Collection Time: 06/14/21  5:04 AM  Result Value Ref Range   Sodium 141 135 - 145 mmol/L  Potassium 4.1 3.5 - 5.1  mmol/L   Chloride 113 (H) 98 - 111 mmol/L   CO2 21 (L) 22 - 32 mmol/L   Glucose, Bld 120 (H) 70 - 99 mg/dL    Comment: Glucose reference range applies only to samples taken after fasting for at least 8 hours.   BUN 106 (H) 8 - 23 mg/dL    Comment: RESULTS CONFIRMED BY MANUAL DILUTION   Creatinine, Ser 3.78 (H) 0.44 - 1.00 mg/dL   Calcium 8.3 (L) 8.9 - 10.3 mg/dL   GFR, Estimated 12 (L) >60 mL/min    Comment: (NOTE) Calculated using the CKD-EPI Creatinine Equation (2021)    Anion gap 7 5 - 15    Comment: Performed at [facility], [address], [place], [place] [address]  Glucose, capillary     Status: Abnormal   Collection Time: 06/14/21  7:16 AM  Result Value Ref Range   Glucose-Capillary 118 (H) 70 - 99 mg/dL    Comment: Glucose reference range applies only to samples taken after fasting for at least 8 hours.  Glucose, capillary     Status: Abnormal   Collection Time: 06/14/21 11:17 AM  Result Value Ref Range   Glucose-Capillary 159 (H) 70 - 99 mg/dL    Comment: Glucose reference range applies only to samples taken after fasting for at least 8 hours.  Glucose, capillary     Status: Abnormal   Collection Time: 06/14/21  4:23 PM  Result Value Ref Range   Glucose-Capillary 151 (H) 70 - 99 mg/dL    Comment: Glucose reference range applies only to samples taken after fasting for at least 8 hours.  Glucose, capillary     Status: Abnormal   Collection Time: 06/14/21  9:08 PM  Result Value Ref Range   Glucose-Capillary 110 (H) 70 - 99 mg/dL    Comment: Glucose reference range applies only to samples taken after fasting for at least 8 hours.  Renal function panel     Status: Abnormal   Collection Time: 06/15/21  7:35 AM  Result Value Ref Range   Sodium 140 135 - 145 mmol/L   Potassium 3.9 3.5 - 5.1 mmol/L   Chloride 112 (H) 98 - 111 mmol/L   CO2 20 (L) 22 - 32 mmol/L   Glucose, Bld 105 (H) 70 - 99 mg/dL    Comment: Glucose reference range applies only to samples taken after  fasting for at least 8 hours.   BUN 106 (H) 8 - 23 mg/dL    Comment: RESULTS CONFIRMED BY MANUAL DILUTION   Creatinine, Ser 3.30 (H) 0.44 - 1.00 mg/dL   Calcium 7.9 (L) 8.9 - 10.3 mg/dL   Phosphorus 5.8 (H) 2.5 - 4.6 mg/dL   Albumin 2.8 (L) 3.5 - 5.0 g/dL   GFR, Estimated 14 (L) >60 mL/min    Comment: (NOTE) Calculated using the CKD-EPI Creatinine Equation (2021)    Anion gap 8 5 - 15    Comment: Performed at [facility], [address] [place], [place] [address]  Glucose, capillary     Status: Abnormal   Collection Time: 06/15/21  7:37 AM  Result Value Ref Range   Glucose-Capillary 110 (H) 70 - 99 mg/dL    Comment: Glucose reference range applies only to samples taken after fasting for at least 8 hours.  Glucose, capillary     Status: Abnormal   Collection Time: 06/15/21 11:22 AM  Result Value Ref Range   Glucose-Capillary 168 (H) 70 - 99 mg/dL  Comment: Glucose reference range applies only to samples taken after fasting for at least 8 hours.     ROS:  A comprehensive review of systems was negative except for: Constitutional: positive for fatigue Cardiovascular: positive for lower extremity edema Genitourinary: positive for decreased stream  Physical Exam: Vitals:   06/14/21 2227 06/15/21 0522  BP:  (!) 140/59  Pulse: 67 62  Resp: 20 18  Temp:  97.8 F (36.6 C)  SpO2: 99% 100%     General: Obese, pleasant white female-seems quite debilitated.  No acute distress HEENT: Pupils are equal round reactive to light, extraocular motions are intact, mucous membranes moist  Neck: Difficult to appreciate JVD Heart: Regular rate and rhythm Lungs: Poor effort, decreased breath sounds at the bases Abdomen: Obese, soft, nontender Extremities: Trace to 1+ pitting edema to dependent areas Skin: Warm and dry Neuro: Slightly slowed mentation  Assessment/Plan: 71 year old white female with diabetes as well as diastolic dysfunction.  She has longstanding stage III CKD.  She now  presents with acute on chronic renal failure with resultant symptoms 1.Renal-patient has longstanding baseline stage III CKD.  This is likely secondary to her diabetes and heart failure issues.  Has now developed acute on chronic renal failure.  Urinalysis bland, ultrasound normal.  What I suspect is that her blood pressure was getting too low on her extensive antihypertensive regimen.  She also complains of symptoms that are consistent with orthostasis.  So far this hospitalization with decreased blood pressure medications, discontinuation of ARB and holding of diuretics fortunately renal function appears to be improving 2. Hypertension/volume  -as above, concern of hypotension and a little bit of ATN.  Blood pressure medications have been titrated downward.  No change today.  She is volume overloaded.  If kidney function continues to improve I suspect she will auto diuresis.  However, will follow for need to resume diuretics 3. Anemia  -hemoglobin dropped and is very low.  This is probably contributing to her weakness.  Will check iron stores and give dose of ESA.  May also consider transfusion-other work-up per primary   Louis Meckel 06/15/2021, 11:46 AM

## 2021-06-15 NOTE — Progress Notes (Signed)
Progress Note   Patient: Christy [name] KDT:[id number] DOB: 12-15-1950 DOA: 06/12/2021     3 DOS: the patient was seen and examined on 06/15/2021   Brief hospital course: As per H&P written by Dr. Clearence Ped On 06/13/2021 Christy [name] is a 71 y.o. female with medical history significant of with history of diabetes mellitus type 2, grade 2 diastolic CHF, CAD, essential hypertension, mixed hyperlipidemia, OSA on CPAP, anemia, and more presents to ED with a chief complaint of generalized weakness and fatigue.  Patient reports that she has had 6-7 falls in the last week.  She says her family tells her she seems dizzy, but patient does not feel it.  She reports she did not hit her head or lose consciousness with any of these falls.  The falls occurred because she loses her balance.  This started happening 2 weeks ago.  She does not have chest pain palpitations or headache before the fall.  Patient is usually able to ambulate without an assistive device.  According to family patient was working up to 1 week ago, and then has not been able to work for the last week.  Patient reports that the symptoms were sudden in onset.  She denies fever, cough, but admits to generalized weakness and shortness of breath.  She especially had worse shortness of breath last night.  She reports it was laying down when it started.  Sitting up may have made it better, but she was scared to sit up because she loses her balance easily.  She had no change in her speech, no change in vision, no dysphagia, and reports no weakness on one side more than the other.  Physical exam does reveal subtle left lower extremity weakness compared to the right.  Patient denies facial droop.  She reports she does drool sometimes, it can happen at either corner of her mouth, and this has been happening for weeks.  Patient denies dysuria, hematuria, urgency, frequency.  She wears no O2 at baseline.  Patient does wear CPAP at night.  She has no further  complaints at this time.  Patient does not smoke, does not drink alcohol, does not use illicit drugs.  Patient is vaccinated for COVID.  Patient is full code.  Assessment and Plan: * AKI (acute kidney injury) ([place]) -Patient with chronic kidney disease a stage IIIb-a stage IV at baseline; transition point. -Creatinine bumped from 1.97--> 3.78>> 3.30 -BUN 106 -Concern for prerenal azotemia, dehydration/hypotension and continued use of nephrotoxic agents. - fluid resuscitation provided without significant improvement. -Patient with underlying history of grade 2 diastolic dysfunction limiting IV fluids strategies. -Renal ultrasound demonstrating no hydronephrosis or obstructive uropathy -Nephrology service has been consulted for further assistance regarding evaluation and management.  Will follow recommendations. -Avoid hypotension, contrast and nephrotoxic agents.  Acute respiratory failure with hypoxia (HCC) -With O2 sats documented down to 88% on room air -Stable with 2 L supplementation -CT chest demonstrating concern for pneumonia -Continue IV antibiotics, supportive care and wean off oxygen supplementation as tolerated.   CAP (community acquired pneumonia) -Chest x-ray had concern for pneumonia and then CT chest shows small amount of right lower lobe airspace disease worrisome for pneumonia as well. -Continue current antibiotic therapy; if remains stable will transition to oral route on 06/16/2021. -Procalcitonin 0.13 -Follow culture results. -Continue the use of flutter valve and bronchodilators. -Wean off oxygen supplementation as tolerated; currently on 2 L nasal cannula.  Patient did not use oxygen at baseline.  Generalized weakness -Likely  multifactorial -CAP, anemia and worsening renal function with uremia presentation. -Physical therapy has seen patient and at this moment recommendations given for skilled nursing facility at discharge for further care and  conditioning. -Will re-evaluate once medically clear.  Diabetes mellitus type 2 in obese (HCC) -Continue sliding scale insulin and adjusted dose of long-acting. -Follow CBGs fluctuation. -Advised to maintain adequate oral hydration  GERD (gastroesophageal reflux disease) -Continue PPI  Anemia -History of anemia of chronic disease, patient receiving Epogen therapy at home. -No signs of overt bleeding -Follow hemoglobin trend and transfuse as needed. -IV iron and Epogen therapy as per nephrology discretion. -Follow hemoglobin trend.  Essential hypertension -Continue antihypertensive agents at adjusted dose; holding Cozaar and torsemide in the setting of worsening renal function. -Avoid hypotension. -Follow vital signs and further adjust antihypertensive agents as required.  Mixed hyperlipidemia -Continue statin  Chronic diastolic CHF (congestive heart failure) ([place]) -Patient has history of grade 2 diastolic heart failure -Echo during this admission demonstrating grade 2 diastolic dysfunction with preserved ejection fraction and no significant valvular disorder. -Continue the use of Coreg, hydralazine and Imdur at adjusted dose. -Continue holding diuretics due to acute on chronic renal failure. -Follow daily weights, strict I's and O's and low-sodium diet.  Morbid obesity -Body mass index is 38.78 kg/m. -Low calorie diet, portion control and increase physical activity discussed with patient.   Subjective:  Feeling better today; no chest pain, no nausea, no vomiting.  3-4 L nasal cannula supplementation in place.  Patient has remained afebrile.  Reports noticing some increasing urine output.  Physical Exam: Vitals:   06/14/21 2227 06/15/21 0400 06/15/21 0522 06/15/21 1310  BP:   (!) 140/59 (!) 136/39  Pulse: 67  62 63  Resp: '20  18 20  '$ Temp:   97.8 F (36.6 C) 97.8 F (36.6 C)  TempSrc:   Oral Oral  SpO2: 99%  100% 99%  Weight:  99.3 kg    Height:       General  exam: Alert, awake, oriented x 3; no chest pain, no nausea vomiting.  Reports feeling better today. Respiratory system: Good saturation on 3-4 L nasal cannula supplementation; no using accessory muscles.  Positive rhonchi bilaterally. Cardiovascular system: Rate controlled, no rubs, no gallops, unable to properly assess JVD with body habitus. Gastrointestinal system: Abdomen is obese, nondistended, soft and nontender. No organomegaly or masses felt. Normal bowel sounds heard. Central nervous system: Alert and oriented. No focal neurological deficits.  Positive dysmetria appreciated on exam (left more than right). Extremities: No cyanosis or clubbing; trace to 1+ edema appreciated bilaterally. Skin: No petechiae. Psychiatry: Judgement and insight appear normal. Mood & affect appropriate.   Data Reviewed: Renal ultrasound demonstrating no hydronephrosis or obstructive uropathy; normal bladder. Renal function panel: Demonstrating sodium 140, potassium 3.9, chloride 112, bicarb 20, BUN 106, creatinine 3.30, GFR 14, phosphorus 5.8, calcium 7.9.  Family Communication: Daughter at bedside.  Disposition: Status is: Inpatient Remains inpatient appropriate because: Continued to demonstrate worsening offered renal function and BUN; oliguric.  Requiring further work-up and treatment to stabilize renal function.  Patient facing possible need of hemodialysis.   Planned Discharge Destination: Home with Home Health versus skilled nursing facility.   Author: Barton Dubois, MD 06/15/2021 3:30 PM  For on call review [email or web address].

## 2021-06-16 DIAGNOSIS — N179 Acute kidney failure, unspecified: Secondary | ICD-10-CM | POA: Diagnosis not present

## 2021-06-16 DIAGNOSIS — I5032 Chronic diastolic (congestive) heart failure: Secondary | ICD-10-CM | POA: Diagnosis not present

## 2021-06-16 DIAGNOSIS — J189 Pneumonia, unspecified organism: Secondary | ICD-10-CM | POA: Diagnosis not present

## 2021-06-16 DIAGNOSIS — J9601 Acute respiratory failure with hypoxia: Secondary | ICD-10-CM | POA: Diagnosis not present

## 2021-06-16 LAB — CBC
HCT: 25.7 % — ABNORMAL LOW (ref 36.0–46.0)
Hemoglobin: 8.1 g/dL — ABNORMAL LOW (ref 12.0–15.0)
MCH: 30.3 pg (ref 26.0–34.0)
MCHC: 31.5 g/dL (ref 30.0–36.0)
MCV: 96.3 fL (ref 80.0–100.0)
Platelets: 226 10*3/uL (ref 150–400)
RBC: 2.67 MIL/uL — ABNORMAL LOW (ref 3.87–5.11)
RDW: 14.4 % (ref 11.5–15.5)
WBC: 7.1 10*3/uL (ref 4.0–10.5)
nRBC: 0.3 % — ABNORMAL HIGH (ref 0.0–0.2)

## 2021-06-16 LAB — GLUCOSE, CAPILLARY
Glucose-Capillary: 49 mg/dL — ABNORMAL LOW (ref 70–99)
Glucose-Capillary: 63 mg/dL — ABNORMAL LOW (ref 70–99)
Glucose-Capillary: 86 mg/dL (ref 70–99)
Glucose-Capillary: 115 mg/dL — ABNORMAL HIGH (ref 70–99)
Glucose-Capillary: 120 mg/dL — ABNORMAL HIGH (ref 70–99)
Glucose-Capillary: 245 mg/dL — ABNORMAL HIGH (ref 70–99)

## 2021-06-16 LAB — RENAL FUNCTION PANEL
Albumin: 2.9 g/dL — ABNORMAL LOW (ref 3.5–5.0)
Anion gap: 3 — ABNORMAL LOW (ref 5–15)
BUN: 87 mg/dL — ABNORMAL HIGH (ref 8–23)
CO2: 23 mmol/L (ref 22–32)
Calcium: 7.9 mg/dL — ABNORMAL LOW (ref 8.9–10.3)
Chloride: 115 mmol/L — ABNORMAL HIGH (ref 98–111)
Creatinine, Ser: 2.62 mg/dL — ABNORMAL HIGH (ref 0.44–1.00)
GFR, Estimated: 19 mL/min — ABNORMAL LOW (ref >60)
Glucose, Bld: 81 mg/dL (ref 70–99)
Phosphorus: 4.8 mg/dL — ABNORMAL HIGH (ref 2.5–4.6)
Potassium: 4 mmol/L (ref 3.5–5.1)
Sodium: 141 mmol/L (ref 135–145)

## 2021-06-16 MED ORDER — SODIUM CHLORIDE 0.9 % IV SOLN
1.0000 g | INTRAVENOUS | Status: AC
  Administered 2021-06-16: 1 g via INTRAVENOUS
  Filled 2021-06-16: qty 10

## 2021-06-16 MED ORDER — TORSEMIDE 20 MG PO TABS
40.0000 mg | ORAL_TABLET | Freq: Every day | ORAL | Status: DC
  Administered 2021-06-16 – 2021-06-18 (×3): 40 mg via ORAL
  Filled 2021-06-16 (×3): qty 2

## 2021-06-16 MED ORDER — AZITHROMYCIN 250 MG PO TABS
500.0000 mg | ORAL_TABLET | Freq: Every day | ORAL | Status: AC
  Administered 2021-06-16: 500 mg via ORAL
  Filled 2021-06-16: qty 2

## 2021-06-16 MED ORDER — TORSEMIDE 20 MG PO TABS: 20.0000 mg | ORAL_TABLET | Freq: Every day | ORAL | Status: DC

## 2021-06-16 NOTE — TOC Progression Note (Signed)
Transition of Care [facility]) - Progression Note    Patient Details  Name: Christy [name] MRN: [id number] Date of Birth: 05/24/1950  [facility] CM/SW Contact  Boneta Lucks, RN Phone Number: 06/16/2021, 11:21 AM  Clinical Narrative:   Patient improving, daughter coming today to assess. They are thinking she can go home with home health. Discharging in 1-2 days. TOC to follow.   Expected Discharge Plan: [place] Barriers to Discharge: Continued Medical Work up  Expected Discharge Plan and Services Expected Discharge Plan: [place]   Discharge Planning Services: CM Consult   Living arrangements for the past 2 months: Single Family Home                    Readmission Risk Interventions    06/13/2021   12:19 PM  Readmission Risk Prevention Plan  Transportation Screening Complete  HRI or Home Care Consult Complete  Social Work Consult for [place] Planning/Counseling Complete  Palliative Care Screening Not Applicable  Medication Review Press photographer) Complete

## 2021-06-16 NOTE — Progress Notes (Signed)
Pt on BIPAP for the night ?

## 2021-06-16 NOTE — Progress Notes (Signed)
Subjective:  only 450 of UOP noted-  crt down from 3.3 to 2.6-  SBP 140-150-  she is still very weak  Objective Vital signs in last 24 hours: Vitals:   06/15/21 1310 06/15/21 2109 06/15/21 2245 06/16/21 0501  BP: (!) 136/39 (!) 141/49  (!) 153/50  Pulse: 63 (!) 57 60 64  Resp: '20 20 16 20  '$ Temp: 97.8 F (36.6 C) 98.3 F (36.8 C)  98.2 F (36.8 C)  TempSrc: Oral Oral  Oral  SpO2: 99% 95% 96% (P) 97%  Weight:    99.1 kg  Height:    '5\' 3"'$  (1.6 m)   Weight change: -0.2 kg  Intake/Output Summary (Last 24 hours) at 06/16/2021 0849 Last data filed at 06/16/2021 0800 Gross per 24 hour  Intake 1850 ml  Output 450 ml  Net 1400 ml    Assessment/Plan: 71 year old white female with diabetes as well as diastolic dysfunction.  She has longstanding stage III CKD.  She now presents with acute on chronic renal failure with resultant symptoms 1.Renal-patient has longstanding baseline stage III CKD.  This is likely secondary to her diabetes and heart failure issues.  Has now developed acute on chronic renal failure.  Urinalysis bland, ultrasound normal.  What I suspect is that her blood pressure was getting too low on her extensive antihypertensive regimen.  She also was complaining of symptoms that were consistent with orthostasis.  So far this hospitalization with decreased blood pressure medications, discontinuation of ARB and holding of diuretics fortunately renal function appears to be improving 2. Hypertension/volume  -as above, concern of hypotension and a little bit of ATN.  Blood pressure medications have been titrated downward.  No change today.  She is volume overloaded.  I will go ahead and resume some diuretics 3. Anemia  -hemoglobin dropped and is very low.  This is probably contributing to her weakness.  iron stores low-  will give iron and also give dose of ESA.  May also consider transfusion-other work-up per primary-  hgb actually up some today  4. Bones-  continue home dose of calcitriol   5. Dispo-  seems like will need a lot of PT /OT      Louis Meckel    Labs: Basic Metabolic Panel: Recent Labs  Lab 06/14/21 0504 06/15/21 0735 06/16/21 0433  NA 141 140 141  K 4.1 3.9 4.0  CL 113* 112* 115*  CO2 21* 20* 23  GLUCOSE 120* 105* 81  BUN 106* 106* 87*  CREATININE 3.78* 3.30* 2.62*  CALCIUM 8.3* 7.9* 7.9*  PHOS  --  5.8* 4.8*   Liver Function Tests: Recent Labs  Lab 06/13/21 0445 06/15/21 0735 06/16/21 0433  AST 17  --   --   ALT 18  --   --   ALKPHOS 52  --   --   BILITOT 0.7  --   --   PROT 6.0*  --   --   ALBUMIN 3.1* 2.8* 2.9*   No results for input(s): LIPASE, AMYLASE in the last 168 hours. No results for input(s): AMMONIA in the last 168 hours. CBC: Recent Labs  Lab 06/12/21 1738 06/13/21 0445 06/16/21 0434  WBC 10.6* 9.3 7.1  NEUTROABS 8.8*  --   --   HGB 7.9* 7.3* 8.1*  HCT 25.2* 24.0* 25.7*  MCV 96.9 98.4 96.3  PLT 196 170 226   Cardiac Enzymes: No results for input(s): CKTOTAL, CKMB, CKMBINDEX, TROPONINI in the last 168 hours. CBG: Recent Labs  Lab 06/15/21 1731 06/15/21 2111 06/16/21 0735 06/16/21 0753 06/16/21 0819  GLUCAP 164* 144* 49* 63* 86    Iron Studies: No results for input(s): IRON, TIBC, TRANSFERRIN, FERRITIN in the last 72 hours. Studies/Results: US RENAL  Result Date: 06/14/2021 CLINICAL DATA:  Acute kidney injury. EXAM: RENAL / URINARY TRACT ULTRASOUND COMPLETE COMPARISON:  None Available. FINDINGS: Right Kidney: Renal measurements: 11.2 x 4.8 x 6.2 = volume: 174 mL. Echogenicity within normal limits. No mass or hydronephrosis visualized. Left Kidney: Renal measurements: 11.3 x 6.3 x 5.5 = volume: 203 mL. Echogenicity within normal limits. No mass or hydronephrosis visualized. Bladder: Appears normal for degree of bladder distention. Other: None. IMPRESSION: Normal examination of bilateral kidneys and urinary bladder. Electronically Signed   By: Keane Police D.O.   On: 06/14/2021 11:57    Medications: Infusions:  azithromycin 500 mg (06/15/21 2238)   cefTRIAXone (ROCEPHIN)  IV 1 g (06/15/21 2141)   ferric gluconate (FERRLECIT) IVPB 250 mg (06/15/21 1624)    Scheduled Medications:  amLODipine  5 mg Oral Daily   buPROPion  300 mg Oral Daily   calcitRIOL  0.25 mcg Oral Once per day on Mon Wed Fri   cariprazine  1.5 mg Oral Daily   carvedilol  12.5 mg Oral BID WC   darbepoetin (ARANESP) injection - NON-DIALYSIS  200 mcg Subcutaneous Q Sun-1800   divalproex  250 mg Oral Daily   heparin  5,000 Units Subcutaneous Q8H   hydrALAZINE  50 mg Oral Q8H   insulin aspart  0-15 Units Subcutaneous TID WC   insulin aspart  0-5 Units Subcutaneous QHS   insulin detemir  20 Units Subcutaneous QHS   isosorbide mononitrate  60 mg Oral Daily   pantoprazole  40 mg Oral Daily   rosuvastatin  10 mg Oral q AM   sertraline  100 mg Oral BID   traZODone  100 mg Oral QHS    have reviewed scheduled and prn medications.  Physical Exam: General: frail-  pressured speech-  decreased mobilty Heart: RRR Lungs: CBS bilat Abdomen: obese, soft, non tender Extremities: pitting edema    06/16/2021,8:49 AM  LOS: 4 days

## 2021-06-16 NOTE — Progress Notes (Signed)
Pt has been resting comfortably in bed throughout this writers shift. Pt has not had any complaints of pain or discomfort. Pt is voiding well. Will continue to monitor.

## 2021-06-16 NOTE — Progress Notes (Signed)
Progress Note   Patient: Christy [name] HAL:[id number] DOB: [birth year] DOA: 06/12/2021     4 DOS: the patient was seen and examined on 06/16/2021   Brief hospital course: As per H&P written by Dr. Clearence Ped On 06/13/2021 Christy [name] is a 71 y.o. female with medical history significant of with history of diabetes mellitus type 2, grade 2 diastolic CHF, CAD, essential hypertension, mixed hyperlipidemia, OSA on CPAP, anemia, and more presents to ED with a chief complaint of generalized weakness and fatigue.  Patient reports that she has had 6-7 falls in the last week.  She says her family tells her she seems dizzy, but patient does not feel it.  She reports she did not hit her head or lose consciousness with any of these falls.  The falls occurred because she loses her balance.  This started happening 2 weeks ago.  She does not have chest pain palpitations or headache before the fall.  Patient is usually able to ambulate without an assistive device.  According to family patient was working up to 1 week ago, and then has not been able to work for the last week.  Patient reports that the symptoms were sudden in onset.  She denies fever, cough, but admits to generalized weakness and shortness of breath.  She especially had worse shortness of breath last night.  She reports it was laying down when it started.  Sitting up may have made it better, but she was scared to sit up because she loses her balance easily.  She had no change in her speech, no change in vision, no dysphagia, and reports no weakness on one side more than the other.  Physical exam does reveal subtle left lower extremity weakness compared to the right.  Patient denies facial droop.  She reports she does drool sometimes, it can happen at either corner of her mouth, and this has been happening for weeks.  Patient denies dysuria, hematuria, urgency, frequency.  She wears no O2 at baseline.  Patient does wear CPAP at night.  She has no further  complaints at this time.  Patient does not smoke, does not drink alcohol, does not use illicit drugs.  Patient is vaccinated for COVID.  Patient is full code.  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 -Creatinine bumped from 1.97--> 3.78>> 3.30>> 2.6 -BUN 87 -Concern for prerenal azotemia, dehydration/hypotension and continued use of nephrotoxic agents.  Possible component of mild ATN. -Patient with 450 mL of urine in the last 24 hours; (improving). - fluid resuscitation provided initially without significant improvement. -Patient with underlying history of grade 2 diastolic dysfunction limiting IV fluids strategies. -Renal ultrasound demonstrating no hydronephrosis or obstructive uropathy -Nephrology service has been consulted for further assistance regarding evaluation and management.  Will follow recommendations. -Continue avoiding hypotension, contrast and nephrotoxic agents. -Planning for initiation of diuretics on 06/17/2021.Marland Kitchen  Acute respiratory failure with hypoxia (HCC) -With O2 sats documented down to 88% on room air -Stable with 2 L supplementation -CT chest demonstrating concern for pneumonia -Continue IV antibiotics, supportive care and wean off oxygen supplementation as tolerated.   CAP (community acquired pneumonia) -Chest x-ray had concern for pneumonia and then CT chest shows small amount of right lower lobe airspace disease worrisome for pneumonia as well. -Continue current antibiotic therapy; finalizing 50 days of antibiotic therapy.  No fever and normal WBCs.  Will conclude antibiotic therapy after today's  dose. -Procalcitonin 0.13 -Follow culture results. -Continue the use of flutter valve and bronchodilators. -Continue to wean off oxygen supplementation as tolerated; currently on 2 L nasal cannula.  Patient did not use oxygen at baseline.  Generalized  weakness -Likely multifactorial -CAP, anemia and worsening renal function with uremia presentation. -Physical therapy has seen patient and at this moment recommendations given for skilled nursing facility at discharge for further care and conditioning.  Patient and family in agreement. -Will re-evaluate once medically clear.  Diabetes mellitus type 2 in obese (HCC) -Continue sliding scale insulin and adjusted dose of long-acting. -Follow CBGs fluctuation. -Advised to maintain adequate oral hydration  GERD (gastroesophageal reflux disease) -Continue PPI  Anemia -History of anemia of chronic disease, patient receiving Epogen therapy at home. -Follow hemoglobin trend and transfuse as needed. -IV iron and Epogen therapy as per nephrology discretion. -Follow hemoglobin trend. -No overt bleeding appreciated or reported.  Essential hypertension -Continue antihypertensive agents at adjusted dose; holding Cozaar and torsemide in the setting of worsening renal function. -Avoid hypotension. -Follow vital signs and further adjust antihypertensive agents as required. -Heart healthy diet discussed with patient.  Mixed hyperlipidemia -Continue statin  Chronic diastolic CHF (congestive heart failure) ([place]) -Patient has history of grade 2 diastolic heart failure -Echo during this admission demonstrating grade 2 diastolic dysfunction with preserved ejection fraction and no significant valvular disorder. -Continue the use of Coreg, hydralazine and Imdur at adjusted dose. -Continue holding diuretics due to acute on chronic renal failure. -Follow daily weights, strict I's and O's and low-sodium diet. -Demonstrating fluid overload on physical examination.  Morbid obesity -Body mass index is 38.7 kg/m. -Low calorie diet, portion control and increase physical activity discussed with patient.   Subjective:  Reports feeling better; no chest pain, no nausea, no vomiting.  Still with slightly short  winded with exertion.  Using 3-4 L nasal cannula supplementation and demonstrating signs of fluid overload on exam.  Patient is afebrile.  Express increase in urine output.  Physical Exam: Vitals:   06/15/21 1310 06/15/21 2109 06/15/21 2245 06/16/21 0501  BP: (!) 136/39 (!) 141/49  (!) 153/50  Pulse: 63 (!) 57 60 64  Resp: '20 20 16 20  '$ Temp: 97.8 F (36.6 C) 98.3 F (36.8 C)  98.2 F (36.8 C)  TempSrc: Oral Oral  Oral  SpO2: 99% 95% 96% (P) 97%  Weight:    99.1 kg  Height:    '5\' 3"'$  (1.6 m)   General exam: Alert, awake, oriented x 3, no chest pain, no nausea, no vomiting.  Using 3-4 L nasal cannula supplementation. Respiratory system: Decreased breath sounds at bases, no frank crackles, no wheezing.  Positive rhonchi. Cardiovascular system: Rate controlled, no rubs, no gallops, unable to properly assess JVD with body habitus. Gastrointestinal system: Abdomen is obese, nondistended, soft and nontender. No organomegaly or masses felt. Normal bowel sounds heard. Central nervous system: Alert and oriented. No focal neurological deficits.  No asterixis.  Extremities: No cyanosis or clubbing; 1-2+ edema appreciated bilaterally. Skin: No petechiae. Psychiatry: Judgement and insight appear normal. Mood & affect appropriate.   Data Reviewed: Renal ultrasound demonstrating no hydronephrosis or obstructive uropathy; normal bladder. CBC: WBC 7.1, hemoglobin 8.1, platelets count 226 K. Renal function panel: Sodium 141, potassium 4.0, BUN 87, creatinine 2.6 and GFR 19.  Family Communication: Daughter at bedside.  Disposition: Status is: Inpatient Remains inpatient appropriate because: Continued to demonstrate worsening offered renal function and BUN; oliguric.  Requiring further work-up and treatment to stabilize renal function.  Patient facing possible need of hemodialysis.   Planned Discharge Destination: Home with Home Health versus skilled nursing facility.   Author: Barton Dubois,  MD 06/16/2021 9:13 AM  For on call review [email or web address].

## 2021-06-17 ENCOUNTER — Inpatient Hospital Stay (HOSPITAL_COMMUNITY): Payer: Medicare Other

## 2021-06-17 DIAGNOSIS — G930 Cerebral cysts: Secondary | ICD-10-CM | POA: Diagnosis present

## 2021-06-17 DIAGNOSIS — I5032 Chronic diastolic (congestive) heart failure: Secondary | ICD-10-CM | POA: Diagnosis not present

## 2021-06-17 DIAGNOSIS — I10 Essential (primary) hypertension: Secondary | ICD-10-CM | POA: Diagnosis not present

## 2021-06-17 DIAGNOSIS — J9601 Acute respiratory failure with hypoxia: Secondary | ICD-10-CM | POA: Diagnosis not present

## 2021-06-17 DIAGNOSIS — J189 Pneumonia, unspecified organism: Secondary | ICD-10-CM | POA: Diagnosis not present

## 2021-06-17 DIAGNOSIS — N179 Acute kidney failure, unspecified: Secondary | ICD-10-CM | POA: Diagnosis not present

## 2021-06-17 LAB — RENAL FUNCTION PANEL
Albumin: 2.9 g/dL — ABNORMAL LOW (ref 3.5–5.0)
Anion gap: 5 (ref 5–15)
BUN: 88 mg/dL — ABNORMAL HIGH (ref 8–23)
CO2: 23 mmol/L (ref 22–32)
Calcium: 8.6 mg/dL — ABNORMAL LOW (ref 8.9–10.3)
Chloride: 118 mmol/L — ABNORMAL HIGH (ref 98–111)
Creatinine, Ser: 2.05 mg/dL — ABNORMAL HIGH (ref 0.44–1.00)
GFR, Estimated: 25 mL/min — ABNORMAL LOW (ref >60)
Glucose, Bld: 33 mg/dL — CL (ref 70–99)
Phosphorus: 3.6 mg/dL (ref 2.5–4.6)
Potassium: 3.8 mmol/L (ref 3.5–5.1)
Sodium: 146 mmol/L — ABNORMAL HIGH (ref 135–145)

## 2021-06-17 LAB — GLUCOSE, CAPILLARY
Glucose-Capillary: 103 mg/dL — ABNORMAL HIGH (ref 70–99)
Glucose-Capillary: 199 mg/dL — ABNORMAL HIGH (ref 70–99)
Glucose-Capillary: 123 mg/dL — ABNORMAL HIGH (ref 70–99)
Glucose-Capillary: 202 mg/dL — ABNORMAL HIGH (ref 70–99)

## 2021-06-17 LAB — LEGIONELLA PNEUMOPHILA SEROGP 1 UR AG: L. pneumophila Serogp 1 Ur Ag: NEGATIVE

## 2021-06-17 MED ORDER — INSULIN DETEMIR 100 UNIT/ML ~~LOC~~ SOLN
10.0000 [IU] | Freq: Every day | SUBCUTANEOUS | Status: DC
  Administered 2021-06-17: 10 [IU] via SUBCUTANEOUS
  Filled 2021-06-17 (×2): qty 0.1

## 2021-06-17 MED ORDER — DEXTROSE 50 % IV SOLN
50.0000 mL | Freq: Once | INTRAVENOUS | Status: AC
Start: 2021-06-17 — End: 2021-06-17
  Administered 2021-06-17: 50 mL via INTRAVENOUS

## 2021-06-17 MED ORDER — DEXTROSE 50 % IV SOLN
INTRAVENOUS | Status: AC
  Filled 2021-06-17: qty 50

## 2021-06-17 NOTE — Progress Notes (Signed)
Progress Note   Patient: Christy [name]:[id number] DOB: 03/31/1950 DOA: 06/12/2021     5 DOS: the patient was seen and examined on 06/17/2021   Brief hospital course: As per H&P written by Dr. Clearence Ped On 06/13/2021 Christy [name] is a 71 y.o. female with medical history significant of with history of diabetes mellitus type 2, grade 2 diastolic CHF, CAD, essential hypertension, mixed hyperlipidemia, OSA on CPAP, anemia, and more presents to ED with a chief complaint of generalized weakness and fatigue.  Patient reports that she has had 6-7 falls in the last week.  She says her family tells her she seems dizzy, but patient does not feel it.  She reports she did not hit her head or lose consciousness with any of these falls.  The falls occurred because she loses her balance.  This started happening 2 weeks ago.  She does not have chest pain palpitations or headache before the fall.  Patient is usually able to ambulate without an assistive device.  According to family patient was working up to 1 week ago, and then has not been able to work for the last week.  Patient reports that the symptoms were sudden in onset.  She denies fever, cough, but admits to generalized weakness and shortness of breath.  She especially had worse shortness of breath last night.  She reports it was laying down when it started.  Sitting up may have made it better, but she was scared to sit up because she loses her balance easily.  She had no change in her speech, no change in vision, no dysphagia, and reports no weakness on one side more than the other.  Physical exam does reveal subtle left lower extremity weakness compared to the right.  Patient denies facial droop.  She reports she does drool sometimes, it can happen at either corner of her mouth, and this has been happening for weeks.  Patient denies dysuria, hematuria, urgency, frequency.  She wears no O2 at baseline.  Patient does wear CPAP at night.  She has no further  complaints at this time.  Patient does not smoke, does not drink alcohol, does not use illicit drugs.  Patient is vaccinated for COVID.  Patient is full code.  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 -Creatinine bumped from 1.97--> 3.78>> 3.30>> 2.6>>2.05 -BUN 87 -Concern for prerenal azotemia, dehydration/hypotension and continued use of nephrotoxic agents.  Possible component of mild ATN. -Patient with 2.9 L in the last 24 hours. - fluid resuscitation provided initially without significant improvement. -Patient with underlying history of grade 2 diastolic dysfunction limiting IV fluids strategies. -Renal ultrasound demonstrating no hydronephrosis or obstructive uropathy -Nephrology service has been consulted for further assistance regarding evaluation and management.  Will follow recommendations. -Continue avoiding hypotension, contrast and nephrotoxic agents. -Started back on Demadex with good response and no significant hypertension in renal function. -Per nephrology recommendations we will continue current dose of antihypertensive agents and hold the use of Cozaar. -Repeat renal function panel in AM.  Arachnoid cyst of posterior cranial fossa -Case discussed with neurology and neurosurgery -Neurosurgeon (Dr. Trenton Gammon) do not feel that patient's cyst is responsible for her ataxia/dysmetria symptoms. --MRI will be ordered to rule out any potential small posterior infarct and provide further information of a structural changes. -Follow clinical response.  Acute respiratory failure with hypoxia (HCC) -With O2 sats documented down to 88% on  room air -Stable with 2 L supplementation -CT chest demonstrating concern for pneumonia -Continue IV antibiotics, supportive care and wean off oxygen supplementation as tolerated.   CAP (community acquired pneumonia) -Chest x-ray had concern  for pneumonia and then CT chest shows small amount of right lower lobe airspace disease worrisome for pneumonia as well. -Continue current antibiotic therapy; finalizing 50 days of antibiotic therapy.  No fever and normal WBCs.  Will conclude antibiotic therapy after today's dose. -Procalcitonin 0.13 -Follow culture results. -Continue the use of flutter valve and bronchodilators. -Currently off oxygen supplementation at rest; will get desaturation screening.  Generalized weakness -Likely multifactorial -CAP, anemia and worsening renal function with uremia presentation. -Physical therapy has seen patient and at this moment recommendations given for skilled nursing facility at discharge for further care and conditioning.  Patient and family in agreement. -Reevaluation by physical therapy still recommending skilled nursing facility at discharge.  Diabetes mellitus type 2 in obese (HCC) -Continue sliding scale insulin and adjusted dose of long-acting. -Advised to maintain adequate oral hydration -With episode of hypoglycemia; Levemir dose has been adjusted. -Patient advised not to skip meals and will continue closely following CBGs.  GERD (gastroesophageal reflux disease) -Continue PPI  Anemia -History of anemia of chronic disease, patient receiving Epogen therapy at home. -Follow hemoglobin trend and transfuse as needed. -IV iron and Epogen therapy as per nephrology discretion. -Follow hemoglobin trend. -No overt bleeding appreciated or reported.  Essential hypertension -Continue antihypertensive agents at adjusted dose; holding Cozaar and torsemide in the setting of worsening renal function. -Avoid hypotension. -Follow vital signs and further adjust antihypertensive agents as required. -Heart healthy diet discussed with patient.  Mixed hyperlipidemia -Continue statin  Chronic diastolic CHF (congestive heart failure) ([place]) -Patient has history of grade 2 diastolic heart  failure -Echo during this admission demonstrating grade 2 diastolic dysfunction with preserved ejection fraction and no significant valvular disorder. -Continue the use of Coreg, hydralazine and Imdur at adjusted dose. -Continue holding diuretics due to acute on chronic renal failure. -Follow daily weights, strict I's and O's and low-sodium diet. -Demonstrating fluid overload on physical examination.  Morbid obesity -Body mass index is 38.04 kg/m. -Low calorie diet, portion control and increase physical activity discussed with patient.   Subjective:  More interactive and overall feeling better; still weak and complaining of ataxia.  Reports increasing urine output.  While at rest not experiencing desaturation on room air.  Patient with episode of hypoglycemia early this morning (CBGs 33).  Physical Exam: Vitals:   06/16/21 2121 06/17/21 0606 06/17/21 0610 06/17/21 1513  BP: 130/61 (!) 154/89 (!) 154/89 (!) 147/68  Pulse: 64  71 68  Resp: '17  17 18  '$ Temp: 98.6 F (37 C)  98.2 F (36.8 C) 97.9 F (36.6 C)  TempSrc: Tympanic   Oral  SpO2: 94%  93% 95%  Weight:   97.4 kg   Height:   '5\' 3"'$  (1.6 m)    General exam: Alert, awake, oriented x 3; no chest pain, no nausea, no vomiting.  Feeling better and more interactive.  On today's examination while sitting on bedside chair demonstrated good saturation on room air.  Reports feeling weak and is still having ataxia. Respiratory system: Positive scattered rhonchi, no wheezing, decreased breath sounds at the bases.  No using accessory muscle. Cardiovascular system: Rate controlled, no rubs, no gallops, unable to properly assess JVD with body habitus. Gastrointestinal system: Abdomen is obese, nondistended, soft and nontender. No organomegaly or masses felt. Normal bowel sounds  heard. Central nervous system: Alert and oriented. No focal neurological deficits. Extremities: No cyanosis or clubbing; 1+ edema appreciated bilaterally. Skin: No  petechiae. Psychiatry: Judgement and insight appear normal. Mood & affect appropriate.   Data Reviewed: Renal ultrasound demonstrating no hydronephrosis or obstructive uropathy; normal bladder. CBC: WBC 7.1, hemoglobin 8.1, platelets count 226 K. Renal function panel: Sodium 126, potassium 3.8, chloride 118, bicarb 23, BUN 88 and creatinine 2.05.  Family Communication: Daughter at bedside.  Disposition: Status is: Inpatient Remains inpatient appropriate because: Continued to demonstrate worsening offered renal function and BUN; oliguric.  Requiring further work-up and treatment to stabilize renal function.  Patient facing possible need of hemodialysis.   Planned Discharge Destination: Home with Home Health versus skilled nursing facility.   Author: Barton Dubois, MD 06/17/2021 5:44 PM  For on call review [email or web address].

## 2021-06-17 NOTE — Assessment & Plan Note (Signed)
-  Case discussed with neurology and neurosurgery -Neurosurgeon (Dr. Trenton Gammon) do not feel that patient's cyst is responsible for her ataxia/dysmetria symptoms. --MRI will be ordered to rule out any potential small posterior infarct and provide further information of a structural changes. -Follow clinical response.

## 2021-06-17 NOTE — Progress Notes (Signed)
Subjective:  1900 of UOP noted-  crt down from 2.6 to 2.0-  much more interactive today -  did not sleep well  Objective Vital signs in last 24 hours: Vitals:   06/16/21 1520 06/16/21 2121 06/17/21 0606 06/17/21 0610  BP: 106/60 130/61 (!) 154/89 (!) 154/89  Pulse: 63 64  71  Resp: '17 17  17  '$ Temp: 97.9 F (36.6 C) 98.6 F (37 C)  98.2 F (36.8 C)  TempSrc: Oral Tympanic    SpO2: 96% 94%  93%  Weight:    97.4 kg  Height:    '5\' 3"'$  (1.6 m)   Weight change: -1.7 kg  Intake/Output Summary (Last 24 hours) at 06/17/2021 0949 Last data filed at 06/17/2021 0600 Gross per 24 hour  Intake 990 ml  Output 1450 ml  Net -460 ml    Assessment/Plan: 71 year old white female with diabetes as well as diastolic dysfunction.  She has longstanding stage III CKD.  She now presents with acute on chronic renal failure with resultant symptoms 1.Renal-patient has longstanding baseline stage III CKD.  This is likely secondary to her diabetes and heart failure issues.  Has now developed acute on chronic renal failure.  Urinalysis bland, ultrasound normal.  What I suspect is that her blood pressure was getting too low on her extensive antihypertensive regimen.  She also was complaining of symptoms that were consistent with orthostasis.  So far this hospitalization with decreased blood pressure medications, discontinuation of ARB and holding of diuretics fortunately renal function appears to be improving 2. Hypertension/volume  -as above, concern of hypotension and a little bit of ATN.  Blood pressure medications have been titrated downward.  Diuretics were resumed yesterday with good results  3. Anemia  -hemoglobin dropped and is very low.  This is probably contributing to her weakness.  iron stores low-  will give iron and also give dose of ESA.  May also consider transfusion-other work-up per primary-  hgb actually up some  4. Bones-  continue home dose of calcitriol  5. Dispo-  seems like will need a lot of PT  /OT  Renal function close to baseline-  would keep meds as is and not titrate up to previous dosing and would not add back cozaar- renal will sign off-  maybe close to discharge       [place]: Basic Metabolic Panel: Recent Labs  Lab 06/15/21 0735 06/16/21 0433 06/17/21 0404  NA 140 141 146*  K 3.9 4.0 3.8  CL 112* 115* 118*  CO2 20* 23 23  GLUCOSE 105* 81 33*  BUN 106* 87* 88*  CREATININE 3.30* 2.62* 2.05*  CALCIUM 7.9* 7.9* 8.6*  PHOS 5.8* 4.8* 3.6   Liver Function Tests: Recent Labs  Lab 06/13/21 0445 06/15/21 0735 06/16/21 0433 06/17/21 0404  AST 17  --   --   --   ALT 18  --   --   --   ALKPHOS 52  --   --   --   BILITOT 0.7  --   --   --   PROT 6.0*  --   --   --   ALBUMIN 3.1* 2.8* 2.9* 2.9*   No results for input(s): LIPASE, AMYLASE in the last 168 hours. No results for input(s): AMMONIA in the last 168 hours. CBC: Recent Labs  Lab 06/12/21 1738 06/13/21 0445 06/16/21 0434  WBC 10.6* 9.3 7.1  NEUTROABS 8.8*  --   --   HGB  7.9* 7.3* 8.1*  HCT 25.2* 24.0* 25.7*  MCV 96.9 98.4 96.3  PLT 196 170 226   Cardiac Enzymes: No results for input(s): CKTOTAL, CKMB, CKMBINDEX, TROPONINI in the last 168 hours. CBG: Recent Labs  Lab 06/16/21 0819 06/16/21 1138 06/16/21 1640 06/16/21 2139 06/17/21 0754  GLUCAP 86 115* 120* 245* 103*    Iron Studies: No results for input(s): IRON, TIBC, TRANSFERRIN, FERRITIN in the last 72 hours. Studies/Results: No results found. Medications: Infusions:  ferric gluconate (FERRLECIT) IVPB 250 mg (06/16/21 0940)    Scheduled Medications:  amLODipine  5 mg Oral Daily   buPROPion  300 mg Oral Daily   calcitRIOL  0.25 mcg Oral Once per day on Mon Wed Fri   cariprazine  1.5 mg Oral Daily   carvedilol  12.5 mg Oral BID WC   darbepoetin (ARANESP) injection - NON-DIALYSIS  200 mcg Subcutaneous Q Sun-1800   dextrose       divalproex  250 mg Oral Daily   heparin  5,000 Units Subcutaneous Q8H    hydrALAZINE  50 mg Oral Q8H   insulin aspart  0-15 Units Subcutaneous TID WC   insulin aspart  0-5 Units Subcutaneous QHS   insulin detemir  20 Units Subcutaneous QHS   isosorbide mononitrate  60 mg Oral Daily   pantoprazole  40 mg Oral Daily   rosuvastatin  10 mg Oral q AM   sertraline  100 mg Oral BID   torsemide  40 mg Oral Daily   traZODone  100 mg Oral QHS    have reviewed scheduled and prn medications.  Physical Exam: General: frail-  more fluent speech-  decreased mobilty Heart: RRR Lungs: CBS bilat Abdomen: obese, soft, non tender Extremities: pitting edema but better    06/17/2021,9:49 AM  LOS: 5 days

## 2021-06-17 NOTE — Progress Notes (Signed)
Pt on BIPAP for the night ?

## 2021-06-17 NOTE — Progress Notes (Signed)
Lab called with critical value lab draw blood work showed Blood glucose 33. Patient is alert and oriented. Dr. Clearence Ped notified, Orders to give 1 Amp of D5 and snacks to patient.

## 2021-06-17 NOTE — Progress Notes (Signed)
Inpatient Diabetes Program Recommendations  AACE/ADA: New Consensus Statement on Inpatient Glycemic Control (2015)  Target Ranges:  Prepandial:   less than 140 mg/dL      Peak postprandial:   less than 180 mg/dL (1-2 hours)      Critically ill patients:  140 - 180 mg/dL   Lab Results  Component Value Date   GLUCAP 103 (H) 06/17/2021   HGBA1C 5.9 (H) 06/13/2021    Review of Glycemic Control  Latest Reference Range & Units 06/17/21 04:04  Glucose 70 - 99 mg/dL 33 (LL)   Diabetes history: DM 2 Outpatient Diabetes medications:  Tresiba 28 units daily Current orders for Inpatient glycemic control:  Novolog moderate tid with meals and HS Levemir 20 units q HS  Inpatient Diabetes Program Recommendations:    Note low fasting lab glucose.  Please reduce Levemir to 8 units q HS. A1C=5.9%.  She may need reduction in home insulin as well.   Thanks,  Adah Perl, RN, BC-ADM Inpatient Diabetes Coordinator Pager [phone]  (8a-5p)

## 2021-06-17 NOTE — Progress Notes (Addendum)
Physical Therapy Treatment Patient Details Name: Christy [name] MRN: [id number] DOB: March 27, 1950 Today's Date: 06/17/2021   History of Present Illness Christy [name] is a 71 y.o. female with medical history significant of with history of diabetes mellitus type 2, grade 2 diastolic CHF, CAD, essential hypertension, mixed hyperlipidemia, OSA on CPAP, anemia, and more presents to ED with a chief complaint of generalized weakness and fatigue.  Patient reports that she has had 6-7 falls in the last week.  She says her family tells her she seems dizzy, but patient does not feel it.  She reports she did not hit her head or lose consciousness with any of these falls.  The falls occurred because she loses her balance.  This started happening 2 weeks ago.  She does not have chest pain palpitations or headache before the fall.  Patient is usually able to ambulate without an assistive device.  According to family patient was working up to 1 week ago, and then has not been able to work for the last week.  Patient reports that the symptoms were sudden in onset.  She denies fever, cough, but admits to generalized weakness and shortness of breath.  She especially had worse shortness of breath last night.  She reports it was laying down when it started.  Sitting up may have made it better, but she was scared to sit up because she loses her balance easily.  She had no change in her speech, no change in vision, no dysphagia, and reports no weakness on one side more than the other.  Physical exam does reveal subtle left lower extremity weakness compared to the right.  Patient denies facial droop.  She reports she does drool sometimes, it can happen at either corner of her mouth, and this has been happening for weeks.  Patient denies dysuria, hematuria, urgency, frequency.  She wears no O2 at baseline.  Patient does wear CPAP at night.  She has no further complaints at this time.    PT Comments    Patient demonstrates slow  labored movement for sitting up at bedside, had difficulty completing sit to stands due to BLE weakness, increased endurance/distance for taking steps at bedside forward/backwards before having to sit due to c/o fatigue.  Patient tolerated sitting up in chair after therapy with family members present in room - nursing staff aware.  Patient on room air with SpO2 at 91-94% throughout visit.  Patient will benefit from continued skilled physical therapy in hospital and recommended venue below to increase strength, balance, endurance for safe ADLs and gait.      Recommendations for follow up therapy are one component of a multi-disciplinary discharge planning process, led by the attending physician.  Recommendations may be updated based on patient status, additional functional criteria and insurance authorization.  Follow Up Recommendations  Skilled nursing-short term rehab (<3 hours/day)     Assistance Recommended at Discharge Intermittent Supervision/Assistance  Patient can return home with the following A little help with walking and/or transfers;A little help with bathing/dressing/bathroom;Assistance with cooking/housework;Help with stairs or ramp for entrance   Equipment Recommendations  None recommended by PT    Recommendations for Other Services       Precautions / Restrictions Precautions Precautions: Fall Restrictions Weight Bearing Restrictions: No     Mobility  Bed Mobility Overal bed mobility: Needs Assistance Bed Mobility: Supine to Sit     Supine to sit: Min assist, Mod assist     General bed mobility comments: increased time,  labored movement    Transfers Overall transfer level: Needs assistance Equipment used: Rolling walker (2 wheels) Transfers: Sit to/from Stand, Bed to chair/wheelchair/BSC Sit to Stand: Mod assist   Step pivot transfers: Min assist, Mod assist       General transfer comment: unsteady labored movement with difficulty completing sit to  stands due to BLE weakness    Ambulation/Gait Ambulation/Gait assistance: Mod assist Gait Distance (Feet): 15 Feet Assistive device: Rolling walker (2 wheels) Gait Pattern/deviations: Decreased step length - right, Decreased step length - left, Decreased stride length Gait velocity: decreased     General Gait Details: limited to a few slow labored unsteady side steps and steps forward/backwards before having to sit due to fatigue   Stairs             Wheelchair Mobility    Modified Rankin (Stroke Patients Only)       Balance Overall balance assessment: Needs assistance Sitting-balance support: Feet supported, No upper extremity supported Sitting balance-Leahy Scale: Fair Sitting balance - Comments: fair/good seated at EOB   Standing balance support: During functional activity, Bilateral upper extremity supported Standing balance-Leahy Scale: Poor Standing balance comment: fair/poor using RW                            Cognition Arousal/Alertness: Awake/alert Behavior During Therapy: WFL for tasks assessed/performed Overall Cognitive Status: Within Functional Limits for tasks assessed                                          Exercises General Exercises - Lower Extremity Long Arc Quad: Seated, AROM, Strengthening, Both, 10 reps Hip Flexion/Marching: Seated, AROM, Strengthening, Both, 10 reps Toe Raises: Seated, AROM, Strengthening, Both, 10 reps Heel Raises: Seated, AROM, Strengthening, Both, 10 reps    General Comments        Pertinent Vitals/Pain Pain Assessment Pain Assessment: No/denies pain    Home Living                          Prior Function            PT Goals (current goals can now be found in the care plan section) Acute Rehab PT Goals Patient Stated Goal: Return home PT Goal Formulation: With patient Time For Goal Achievement: 06/27/21 Potential to Achieve Goals: Good Progress towards PT goals:  Progressing toward goals    Frequency    Min 3X/week      PT Plan Current plan remains appropriate    Co-evaluation              AM-PAC PT "6 Clicks" Mobility   Outcome Measure  Help needed turning from your back to your side while in a flat bed without using bedrails?: A Little Help needed moving from lying on your back to sitting on the side of a flat bed without using bedrails?: A Lot Help needed moving to and from a bed to a chair (including a wheelchair)?: A Little Help needed standing up from a chair using your arms (e.g., wheelchair or bedside chair)?: A Lot Help needed to walk in hospital room?: A Lot Help needed climbing 3-5 steps with a railing? : A Lot 6 Click Score: 14    End of Session   Activity Tolerance: Patient tolerated treatment well;Patient limited by fatigue Patient  left: in chair;with call bell/phone within reach Nurse Communication: Mobility status PT Visit Diagnosis: Unsteadiness on feet (R26.81);Other abnormalities of gait and mobility (R26.89);Muscle weakness (generalized) (M62.81)     Time: 4562-5638 PT Time Calculation (min) (ACUTE ONLY): 29 min  Charges:  $Therapeutic Exercise: 8-22 mins $Therapeutic Activity: 8-22 mins                     1:51 PM, 06/17/21 Lonell Grandchild, MPT Physical Therapist with [facility] [phone] office [phone] mobile phone

## 2021-06-17 NOTE — Consult Note (Signed)
[facility] Inpatient Consult  06/17/2021  REIGHLYN ELMES 1950/08/10 [id number]  [place] [facility] CM)   Patient chart has been reviewed with noted high risk score for unplanned readmissions.  Patient assessed for community [address] [facility] follow up needs. Per review, patient's primary care office offers a chronic care management team and program. [facility] CM will not follow.   Of note, [facility] Care Management services does not replace or interfere with any services that are arranged by inpatient case management or social work.    Netta Cedars, MSN, RN [facility] Liaison Toll free office [phone]

## 2021-06-18 DIAGNOSIS — I110 Hypertensive heart disease with heart failure: Secondary | ICD-10-CM | POA: Diagnosis not present

## 2021-06-18 DIAGNOSIS — Z79899 Other long term (current) drug therapy: Secondary | ICD-10-CM | POA: Diagnosis not present

## 2021-06-18 DIAGNOSIS — R1319 Other dysphagia: Secondary | ICD-10-CM | POA: Diagnosis not present

## 2021-06-18 DIAGNOSIS — E1122 Type 2 diabetes mellitus with diabetic chronic kidney disease: Secondary | ICD-10-CM | POA: Diagnosis not present

## 2021-06-18 DIAGNOSIS — I5043 Acute on chronic combined systolic (congestive) and diastolic (congestive) heart failure: Secondary | ICD-10-CM | POA: Diagnosis not present

## 2021-06-18 DIAGNOSIS — I454 Nonspecific intraventricular block: Secondary | ICD-10-CM | POA: Diagnosis not present

## 2021-06-18 DIAGNOSIS — Z515 Encounter for palliative care: Secondary | ICD-10-CM | POA: Diagnosis not present

## 2021-06-18 DIAGNOSIS — R609 Edema, unspecified: Secondary | ICD-10-CM | POA: Diagnosis not present

## 2021-06-18 DIAGNOSIS — R0989 Other specified symptoms and signs involving the circulatory and respiratory systems: Secondary | ICD-10-CM | POA: Diagnosis not present

## 2021-06-18 DIAGNOSIS — I350 Nonrheumatic aortic (valve) stenosis: Secondary | ICD-10-CM | POA: Diagnosis not present

## 2021-06-18 DIAGNOSIS — K219 Gastro-esophageal reflux disease without esophagitis: Secondary | ICD-10-CM | POA: Diagnosis not present

## 2021-06-18 DIAGNOSIS — M7989 Other specified soft tissue disorders: Secondary | ICD-10-CM | POA: Diagnosis not present

## 2021-06-18 DIAGNOSIS — Z91199 Patient's noncompliance with other medical treatment and regimen due to unspecified reason: Secondary | ICD-10-CM | POA: Diagnosis not present

## 2021-06-18 DIAGNOSIS — J9601 Acute respiratory failure with hypoxia: Secondary | ICD-10-CM | POA: Diagnosis not present

## 2021-06-18 DIAGNOSIS — M6281 Muscle weakness (generalized): Secondary | ICD-10-CM | POA: Diagnosis not present

## 2021-06-18 DIAGNOSIS — J1289 Other viral pneumonia: Secondary | ICD-10-CM | POA: Diagnosis not present

## 2021-06-18 DIAGNOSIS — G4733 Obstructive sleep apnea (adult) (pediatric): Secondary | ICD-10-CM | POA: Diagnosis not present

## 2021-06-18 DIAGNOSIS — F339 Major depressive disorder, recurrent, unspecified: Secondary | ICD-10-CM | POA: Diagnosis not present

## 2021-06-18 DIAGNOSIS — R0602 Shortness of breath: Secondary | ICD-10-CM | POA: Diagnosis not present

## 2021-06-18 DIAGNOSIS — I428 Other cardiomyopathies: Secondary | ICD-10-CM | POA: Diagnosis not present

## 2021-06-18 DIAGNOSIS — J9692 Respiratory failure, unspecified with hypercapnia: Secondary | ICD-10-CM | POA: Diagnosis not present

## 2021-06-18 DIAGNOSIS — N1832 Chronic kidney disease, stage 3b: Secondary | ICD-10-CM | POA: Diagnosis not present

## 2021-06-18 DIAGNOSIS — I447 Left bundle-branch block, unspecified: Secondary | ICD-10-CM | POA: Diagnosis not present

## 2021-06-18 DIAGNOSIS — N2589 Other disorders resulting from impaired renal tubular function: Secondary | ICD-10-CM | POA: Diagnosis not present

## 2021-06-18 DIAGNOSIS — I4891 Unspecified atrial fibrillation: Secondary | ICD-10-CM | POA: Diagnosis not present

## 2021-06-18 DIAGNOSIS — R069 Unspecified abnormalities of breathing: Secondary | ICD-10-CM | POA: Diagnosis not present

## 2021-06-18 DIAGNOSIS — I2510 Atherosclerotic heart disease of native coronary artery without angina pectoris: Secondary | ICD-10-CM | POA: Diagnosis not present

## 2021-06-18 DIAGNOSIS — R0789 Other chest pain: Secondary | ICD-10-CM | POA: Diagnosis not present

## 2021-06-18 DIAGNOSIS — Z794 Long term (current) use of insulin: Secondary | ICD-10-CM | POA: Diagnosis not present

## 2021-06-18 DIAGNOSIS — Z20822 Contact with and (suspected) exposure to covid-19: Secondary | ICD-10-CM | POA: Diagnosis not present

## 2021-06-18 DIAGNOSIS — R262 Difficulty in walking, not elsewhere classified: Secondary | ICD-10-CM | POA: Diagnosis not present

## 2021-06-18 DIAGNOSIS — J9811 Atelectasis: Secondary | ICD-10-CM | POA: Diagnosis not present

## 2021-06-18 DIAGNOSIS — R918 Other nonspecific abnormal finding of lung field: Secondary | ICD-10-CM | POA: Diagnosis not present

## 2021-06-18 DIAGNOSIS — I10 Essential (primary) hypertension: Secondary | ICD-10-CM | POA: Diagnosis not present

## 2021-06-18 DIAGNOSIS — J189 Pneumonia, unspecified organism: Secondary | ICD-10-CM | POA: Diagnosis not present

## 2021-06-18 DIAGNOSIS — J80 Acute respiratory distress syndrome: Secondary | ICD-10-CM | POA: Diagnosis not present

## 2021-06-18 DIAGNOSIS — R5381 Other malaise: Secondary | ICD-10-CM | POA: Diagnosis not present

## 2021-06-18 DIAGNOSIS — N184 Chronic kidney disease, stage 4 (severe): Secondary | ICD-10-CM | POA: Diagnosis not present

## 2021-06-18 DIAGNOSIS — G9341 Metabolic encephalopathy: Secondary | ICD-10-CM | POA: Diagnosis not present

## 2021-06-18 DIAGNOSIS — R0902 Hypoxemia: Secondary | ICD-10-CM | POA: Diagnosis not present

## 2021-06-18 DIAGNOSIS — R001 Bradycardia, unspecified: Secondary | ICD-10-CM | POA: Diagnosis not present

## 2021-06-18 DIAGNOSIS — I517 Cardiomegaly: Secondary | ICD-10-CM | POA: Diagnosis not present

## 2021-06-18 DIAGNOSIS — N179 Acute kidney failure, unspecified: Secondary | ICD-10-CM | POA: Diagnosis not present

## 2021-06-18 DIAGNOSIS — R0600 Dyspnea, unspecified: Secondary | ICD-10-CM | POA: Diagnosis not present

## 2021-06-18 DIAGNOSIS — E559 Vitamin D deficiency, unspecified: Secondary | ICD-10-CM | POA: Diagnosis not present

## 2021-06-18 DIAGNOSIS — E1169 Type 2 diabetes mellitus with other specified complication: Secondary | ICD-10-CM | POA: Diagnosis not present

## 2021-06-18 DIAGNOSIS — J9602 Acute respiratory failure with hypercapnia: Secondary | ICD-10-CM | POA: Diagnosis not present

## 2021-06-18 DIAGNOSIS — E8770 Fluid overload, unspecified: Secondary | ICD-10-CM | POA: Diagnosis not present

## 2021-06-18 DIAGNOSIS — D649 Anemia, unspecified: Secondary | ICD-10-CM | POA: Diagnosis not present

## 2021-06-18 DIAGNOSIS — D472 Monoclonal gammopathy: Secondary | ICD-10-CM | POA: Diagnosis not present

## 2021-06-18 DIAGNOSIS — J9621 Acute and chronic respiratory failure with hypoxia: Secondary | ICD-10-CM | POA: Diagnosis not present

## 2021-06-18 DIAGNOSIS — D519 Vitamin B12 deficiency anemia, unspecified: Secondary | ICD-10-CM | POA: Diagnosis not present

## 2021-06-18 DIAGNOSIS — J449 Chronic obstructive pulmonary disease, unspecified: Secondary | ICD-10-CM | POA: Diagnosis not present

## 2021-06-18 DIAGNOSIS — E782 Mixed hyperlipidemia: Secondary | ICD-10-CM | POA: Diagnosis not present

## 2021-06-18 DIAGNOSIS — I5030 Unspecified diastolic (congestive) heart failure: Secondary | ICD-10-CM | POA: Diagnosis not present

## 2021-06-18 DIAGNOSIS — E119 Type 2 diabetes mellitus without complications: Secondary | ICD-10-CM | POA: Diagnosis not present

## 2021-06-18 DIAGNOSIS — I5032 Chronic diastolic (congestive) heart failure: Secondary | ICD-10-CM | POA: Diagnosis not present

## 2021-06-18 DIAGNOSIS — F319 Bipolar disorder, unspecified: Secondary | ICD-10-CM | POA: Diagnosis not present

## 2021-06-18 DIAGNOSIS — G930 Cerebral cysts: Secondary | ICD-10-CM

## 2021-06-18 DIAGNOSIS — J90 Pleural effusion, not elsewhere classified: Secondary | ICD-10-CM | POA: Diagnosis not present

## 2021-06-18 DIAGNOSIS — I509 Heart failure, unspecified: Secondary | ICD-10-CM | POA: Diagnosis not present

## 2021-06-18 DIAGNOSIS — B370 Candidal stomatitis: Secondary | ICD-10-CM | POA: Diagnosis not present

## 2021-06-18 DIAGNOSIS — Z66 Do not resuscitate: Secondary | ICD-10-CM | POA: Diagnosis not present

## 2021-06-18 DIAGNOSIS — J9622 Acute and chronic respiratory failure with hypercapnia: Secondary | ICD-10-CM | POA: Diagnosis not present

## 2021-06-18 DIAGNOSIS — I130 Hypertensive heart and chronic kidney disease with heart failure and stage 1 through stage 4 chronic kidney disease, or unspecified chronic kidney disease: Secondary | ICD-10-CM | POA: Diagnosis not present

## 2021-06-18 DIAGNOSIS — I3139 Other pericardial effusion (noninflammatory): Secondary | ICD-10-CM | POA: Diagnosis not present

## 2021-06-18 LAB — RENAL FUNCTION PANEL
Albumin: 2.7 g/dL — ABNORMAL LOW (ref 3.5–5.0)
Anion gap: 4 — ABNORMAL LOW (ref 5–15)
BUN: 76 mg/dL — ABNORMAL HIGH (ref 8–23)
CO2: 24 mmol/L (ref 22–32)
Calcium: 8.3 mg/dL — ABNORMAL LOW (ref 8.9–10.3)
Chloride: 117 mmol/L — ABNORMAL HIGH (ref 98–111)
Creatinine, Ser: 1.65 mg/dL — ABNORMAL HIGH (ref 0.44–1.00)
GFR, Estimated: 33 mL/min — ABNORMAL LOW (ref >60)
Glucose, Bld: 109 mg/dL — ABNORMAL HIGH (ref 70–99)
Phosphorus: 2.8 mg/dL (ref 2.5–4.6)
Potassium: 3.9 mmol/L (ref 3.5–5.1)
Sodium: 145 mmol/L (ref 135–145)

## 2021-06-18 LAB — GLUCOSE, CAPILLARY
Glucose-Capillary: 124 mg/dL — ABNORMAL HIGH (ref 70–99)
Glucose-Capillary: 101 mg/dL — ABNORMAL HIGH (ref 70–99)
Glucose-Capillary: 124 mg/dL — ABNORMAL HIGH (ref 70–99)

## 2021-06-18 MED ORDER — TORSEMIDE 40 MG PO TABS: 40.0000 mg | ORAL_TABLET | Freq: Every day | ORAL | Status: AC

## 2021-06-18 MED ORDER — HYDRALAZINE HCL 50 MG PO TABS: 50.0000 mg | ORAL_TABLET | Freq: Three times a day (TID) | ORAL | Status: AC

## 2021-06-18 MED ORDER — AMLODIPINE BESYLATE 5 MG PO TABS
5.0000 mg | ORAL_TABLET | Freq: Every day | ORAL | Status: AC
Start: 2021-06-19 — End: ?

## 2021-06-18 MED ORDER — CARIPRAZINE HCL 1.5 MG PO CAPS
1.5000 mg | ORAL_CAPSULE | Freq: Every day | ORAL | Status: AC
Start: 2021-06-19 — End: ?

## 2021-06-18 NOTE — Discharge Summary (Signed)
Physician Discharge Summary   Patient: Christy [name] MRN: [id number] DOB: 1950/09/01  Admit date:     06/12/2021  Discharge date: 06/18/21  Discharge Physician: Shanon Brow Alexxis Mackert   PCP: Monico Blitz, MD   Recommendations at discharge:   Please follow up with primary care provider within 1-2 weeks  Please repeat BMP and CBC in one week Wean oxygen to room air for saturation >92%   Hospital Course: As per H&P written by Dr. Clearence Ped On 06/13/2021 Christy [name] is a 71 y.o. female with medical history significant of with history of diabetes mellitus type 2, grade 2 diastolic CHF, CAD, essential hypertension, mixed hyperlipidemia, OSA on CPAP, anemia, and more presents to ED with a chief complaint of generalized weakness and fatigue.  Patient reports that she has had 6-7 falls in the last week.  She says her family tells her she seems dizzy, but patient does not feel it.  She reports she did not hit her head or lose consciousness with any of these falls.  The falls occurred because she loses her balance.  This started happening 2 weeks ago.  She does not have chest pain palpitations or headache before the fall.  Patient is usually able to ambulate without an assistive device.  According to family patient was working up to 1 week ago, and then has not been able to work for the last week.  Patient reports that the symptoms were sudden in onset.  She denies fever, cough, but admits to generalized weakness and shortness of breath.  She especially had worse shortness of breath last night.  She reports it was laying down when it started.  Sitting up may have made it better, but she was scared to sit up because she loses her balance easily.  She had no change in her speech, no change in vision, no dysphagia, and reports no weakness on one side more than the other.  Physical exam does reveal subtle left lower extremity weakness compared to the right.  Patient denies facial droop.  She reports she does drool  sometimes, it can happen at either corner of her mouth, and this has been happening for weeks.  Patient denies dysuria, hematuria, urgency, frequency.  She wears no O2 at baseline.  Patient does wear CPAP at night.  She has no further complaints at this time.  Patient does not smoke, does not drink alcohol, does not use illicit drugs.  Patient is vaccinated for COVID.  Patient is full code. The patient was started on ceftriaxone and IV azithro which she finished 5 days.  Nephrology was consulted fro patient's acute on chronic renal failure.  She was started on IVF with improvement.    Assessment and Plan: * Acute renal failure superimposed on stage 3b chronic kidney disease (HCC) -baseline creatinine 1.6-1.7 -Creatinine bumped from 1.97--> 3.78>> 3.30>> 2.6>>2.05>>1.65 -multifactorial including dehydration/hypotension and continued use of nephrotoxic agents.  Possible component of mild ATN. -Patient with 2.9 L in the last 24 hours. - fluid resuscitation provided initially without significant improvement. -Patient with underlying history of grade 2 diastolic dysfunction -Renal ultrasound demonstrating no hydronephrosis or obstructive uropathy -Nephrology service has been consulted for further assistance regarding evaluation and management.  Will follow recommendations. -Continue avoiding hypotension, contrast and nephrotoxic agents. -Started back on Demadex with good response and no significant hypertension in renal function. -Per nephrology recommendations we will continue current dose of antihypertensive agents and hold the use of Cozaar. -Repeat renal function panel in AM.  Arachnoid  cyst of posterior cranial fossa -Case discussed with neurology and neurosurgery -Neurosurgeon (Dr. Trenton Gammon) do not feel that patient's cyst is responsible for her ataxia/dysmetria symptoms. --MRI will be ordered to rule out any potential small posterior infarct and provide further information of a structural  changes. -Follow clinical response.  Acute respiratory failure with hypoxia (HCC) -With O2 sats documented down to 88% on room air -Stable with 2 L supplementation -CT chest demonstrating concern for pneumonia -Continue IV antibiotics, supportive care and wean off oxygen supplementation as tolerated. -wean to RA for saturation >92%   CAP (community acquired pneumonia) -Chest x-ray had concern for pneumonia and then CT chest shows small amount of right lower lobe airspace disease worrisome for pneumonia as well. -Continue current antibiotic therapy; finalizing 5 days of antibiotic therapy.  No fever and normal WBCs.  Will conclude antibiotic therapy after today's dose. -Procalcitonin 0.13 -Follow culture results. -Continue the use of flutter valve and bronchodilators.   Generalized weakness -Likely multifactorial -CAP, anemia and worsening renal function with uremia presentation. -Physical therapy has seen patient and at this moment recommendations given for skilled nursing facility at discharge for further care and conditioning.  Patient and family in agreement. -Reevaluation by physical therapy still recommending skilled nursing facility at discharge.  Diabetes mellitus type 2 in obese (HCC) -Continue sliding scale insulin and adjusted dose of long-acting. -Advised to maintain adequate oral hydration -With episode of hypoglycemia; Levemir dose has been adjusted. -Patient advised not to skip meals and will continue closely following CBGs.  GERD (gastroesophageal reflux disease) -Continue PPI  Anemia -History of anemia of chronic disease, patient receiving Epogen therapy at home. -Follow hemoglobin trend and transfuse as needed. -IV iron and Epogen therapy as per nephrology discretion. -Follow hemoglobin trend. -No overt bleeding appreciated or reported.  Essential hypertension -Continue antihypertensive agents at adjusted dose; holding Cozaar and torsemide in the setting of  worsening renal function. -Avoid hypotension. -Follow vital signs and further adjust antihypertensive agents as required. -Heart healthy diet discussed with patient.  Mixed hyperlipidemia -Continue statin  Chronic diastolic CHF (congestive heart failure) ([place]) -Patient has history of grade 2 diastolic heart failure -Echo during this admission demonstrating grade 2 diastolic dysfunction with preserved ejection fraction and no significant valvular disorder. -Continue the use of Coreg, hydralazine and Imdur at adjusted dose. -Continue holding diuretics due to acute on chronic renal failure. -Follow daily weights, strict I's and O's and low-sodium diet. -Demonstrating fluid overload on physical examination.       Consultants: renal Procedures performed: none Disposition: Skilled nursing facility Diet recommendation:  Cardiac diet DISCHARGE MEDICATION: Allergies as of 06/18/2021       Reactions   Ace Inhibitors Cough   Lisinopril Cough   Severe cough   Penicillins    Unknown, childhood reaction   Tetracycline Nausea Only   Lamictal [lamotrigine] Rash   Rash start inside to outter body.        Medication List     STOP taking these medications    fluconazole 100 MG tablet Commonly known as: DIFLUCAN   losartan 50 MG tablet Commonly known as: COZAAR   nitroGLYCERIN 0.4 MG/SPRAY spray Commonly known as: NITROLINGUAL   tiZANidine 4 MG tablet Commonly known as: ZANAFLEX       TAKE these medications    allopurinol 300 MG tablet Commonly known as: ZYLOPRIM Take 150 mg by mouth in the morning.   amLODipine 5 MG tablet Commonly known as: NORVASC Take 1 tablet (5 mg total) by mouth  daily. Start taking on: June 19, 2021 What changed:  medication strength how much to take   aspirin EC 81 MG tablet Take 81 mg by mouth at bedtime.   buPROPion 300 MG 24 hr tablet Commonly known as: WELLBUTRIN XL Take 300 mg by mouth in the morning.   calcitRIOL 0.25 MCG  capsule Commonly known as: ROCALTROL Take 0.25 mcg by mouth 3 (three) times a week.   cariprazine 1.5 MG capsule Commonly known as: Vraylar Take 1 capsule (1.5 mg total) by mouth daily. Start taking on: June 19, 2021 What changed:  how much to take when to take this   carvedilol 12.5 MG tablet Commonly known as: COREG Take 12.5 mg by mouth 2 (two) times daily with a meal.   divalproex 250 MG DR tablet Commonly known as: DEPAKOTE Take 250 mg by mouth in the morning.   epoetin alfa 3000 UNIT/ML injection Commonly known as: EPOGEN Inject into the skin.   esomeprazole 40 MG capsule Commonly known as: NEXIUM Take 1 capsule (40 mg total) by mouth 2 (two) times daily before a meal.   Global Ease Inject Pen Needles 32G X 4 MM Misc Generic drug: Insulin Pen Needle See admin instructions.   hydrALAZINE 50 MG tablet Commonly known as: APRESOLINE Take 1 tablet (50 mg total) by mouth every 8 (eight) hours. What changed:  medication strength how much to take when to take this   hydrOXYzine 50 MG capsule Commonly known as: VISTARIL Take 50 mg by mouth 3 (three) times daily as needed for anxiety or itching.   isosorbide mononitrate 60 MG 24 hr tablet Commonly known as: IMDUR Take 60 mg by mouth in the morning.   rosuvastatin 10 MG tablet Commonly known as: CRESTOR Take 10 mg by mouth in the morning.   sertraline 100 MG tablet Commonly known as: ZOLOFT Take 100 mg by mouth 2 (two) times daily.   Torsemide 40 MG Tabs Take 40 mg by mouth daily. Start taking on: June 19, 2021 What changed:  medication strength how much to take when to take this   traZODone 100 MG tablet Commonly known as: DESYREL Take 100 mg by mouth at bedtime.   Tyler Aas FlexTouch 200 UNIT/ML FlexTouch Pen Generic drug: insulin degludec Inject 28 Units into the skin in the morning.        Discharge Exam: Filed Weights   06/16/21 0501 06/17/21 0610 06/18/21 0500  Weight: 99.1 kg 97.4 kg 98.7  kg   HEENT:  [place]/AT, No thrush, no icterus CV:  RRR, no rub, no S3, no S4 Lung:  bibasilar crackles. No wheeze Abd:  soft/+BS, NT Ext:  No edema, no lymphangitis, no synovitis, no rash   Condition at discharge: stable  The results of significant diagnostics from this hospitalization (including imaging, microbiology, ancillary and laboratory) are listed below for reference.   Imaging Studies: CT Head Wo Contrast  Result Date: 06/12/2021 CLINICAL DATA:  Fall, cyst on brain EXAM: CT HEAD WITHOUT CONTRAST TECHNIQUE: Contiguous axial images were obtained from the base of the skull through the vertex without intravenous contrast. RADIATION DOSE REDUCTION: This exam was performed according to the departmental dose-optimization program which includes automated exposure control, adjustment of the mA and/or kV according to patient size and/or use of iterative reconstruction technique. COMPARISON:  06/06/2021 FINDINGS: Brain: No evidence of acute infarction, hemorrhage, hydrocephalus, or extra-axial collection. Mild subcortical white matter and periventricular small vessel ischemic changes. Mild cortical atrophy. Stable posterior fossa arachnoid cyst. Vascular: Intracranial atherosclerosis.  Skull: Normal. Negative for fracture or focal lesion. Sinuses/Orbits: The visualized paranasal sinuses are essentially clear. The mastoid air cells are unopacified. Other: None. IMPRESSION: No evidence of acute intracranial abnormality. Mild atrophy with small vessel ischemic changes. Stable posterior fossa arachnoid cyst. Electronically Signed   By: Julian Hy M.D.   On: 06/12/2021 21:34   CT Chest Wo Contrast  Result Date: 06/12/2021 CLINICAL DATA:  Pneumonia. EXAM: CT CHEST WITHOUT CONTRAST TECHNIQUE: Multidetector CT imaging of the chest was performed following the standard protocol without IV contrast. RADIATION DOSE REDUCTION: This exam was performed according to the departmental dose-optimization program  which includes automated exposure control, adjustment of the mA and/or kV according to patient size and/or use of iterative reconstruction technique. COMPARISON:  Chest x-ray 06/12/2021. FINDINGS: Cardiovascular: The heart is enlarged. There is no pericardial effusion. Aorta is normal in size. There are atherosclerotic calcifications of the aorta. Mediastinum/Nodes: Visualized esophagus and thyroid gland are within normal limits. There are no enlarged lymph nodes identified allowing for lack of intravenous contrast. Lungs/Pleura: There is some calcified granulomas in the left lung. There are minimal tree-in-bud and ill-defined nodular densities measuring up to 3 mm in the right upper lobe. There is a small amount of airspace consolidation in the right lower lobe. There is a 5 mm nodule in the left lower lobe image 5/56. There is minimal atelectasis in the lingula. There is no pleural effusion or pneumothorax identified. Upper Abdomen: Atherosclerotic calcifications are present. No acute findings. Musculoskeletal: Degenerative changes affect the spine. IMPRESSION: 1. Small amount of right lower lobe airspace disease worrisome for pneumonia. Recommend follow-up imaging to confirm complete resolution. 2. Tree-in-bud opacities in the right upper lobe, likely infectious/inflammatory. 3. 5 mm left solid pulmonary nodule. No routine follow-up imaging is recommended per Fleischner Society Guidelines. These guidelines do not apply to immunocompromised patients and patients with cancer. Follow up in patients with significant comorbidities as clinically warranted. For lung cancer screening, adhere to Lung-RADS guidelines. Reference: Radiology. 2017; 284(1):228-43. 4. Cardiomegaly. Aortic Atherosclerosis (ICD10-I70.0). Electronically Signed   By: Ronney Asters M.D.   On: 06/12/2021 21:31   MR BRAIN WO CONTRAST  Result Date: 06/17/2021 CLINICAL DATA:  Acute neuro deficit EXAM: MRI HEAD WITHOUT CONTRAST TECHNIQUE:  Multiplanar, multiecho pulse sequences of the brain and surrounding structures were obtained without intravenous contrast. COMPARISON:  CT head 06/12/2021 FINDINGS: Brain: Negative for acute infarct. Mild atrophy. Negative for hemorrhage or mass. Few small white matter hyperintensities bilaterally Fluid collection in the posterior fossa on the left with mass-effect on the left cerebellum. This is most compatible with arachnoid cyst measuring 6.4 x 3.6 cm. Vascular: Normal arterial flow voids Skull and upper cervical spine: No focal skeletal lesion. Sinuses/Orbits: Paranasal sinuses clear. Bilateral cataract extraction Other: None IMPRESSION: Negative for acute infarct. Cyst in the left posterior fossa compatible with arachnoid cyst. Mild white matter changes Electronically Signed   By: Franchot Gallo M.D.   On: 06/17/2021 19:12   US RENAL  Result Date: 06/14/2021 CLINICAL DATA:  Acute kidney injury. EXAM: RENAL / URINARY TRACT ULTRASOUND COMPLETE COMPARISON:  None Available. FINDINGS: Right Kidney: Renal measurements: 11.2 x 4.8 x 6.2 = volume: 174 mL. Echogenicity within normal limits. No mass or hydronephrosis visualized. Left Kidney: Renal measurements: 11.3 x 6.3 x 5.5 = volume: 203 mL. Echogenicity within normal limits. No mass or hydronephrosis visualized. Bladder: Appears normal for degree of bladder distention. Other: None. IMPRESSION: Normal examination of bilateral kidneys and urinary bladder. Electronically Signed  By: Keane Police D.O.   On: 06/14/2021 11:57   DG Chest Port 1 View  Result Date: 06/12/2021 CLINICAL DATA:  Acute kidney injury. EXAM: PORTABLE CHEST 1 VIEW COMPARISON:  Chest x-ray 06/06/2021 FINDINGS: The heart is enlarged. There central pulmonary vascular congestion. There is prominence of the right hilum. There are some patchy opacities in the left lower lung. There is no pleural effusion or pneumothorax. No acute fractures are seen. IMPRESSION: 1. Cardiomegaly with central  pulmonary vascular congestion. 2. Minimal left lower lung atelectasis/airspace disease. 3. Prominent of the right hilum may be secondary to edema. Recommend follow-up PA and lateral chest x-ray when acute symptoms resolve. Electronically Signed   By: Ronney Asters M.D.   On: 06/12/2021 20:47   ECHOCARDIOGRAM COMPLETE  Result Date: 06/13/2021    ECHOCARDIOGRAM REPORT   Patient Name:   UNIQUE SILLAS Date of Exam: 06/13/2021 Medical Rec #:  [id number]      Height:       63.0 in Accession #:    [id number]     Weight:       216.5 lb Date of Birth:  1950-10-02     BSA:          2.000 m Patient Age:    71 years       BP:           136/40 mmHg Patient Gender: F              HR:           63 bpm. Exam Location:  [facility] Procedure: 2D Echo, Cardiac Doppler and Color Doppler Indications:    Dyspnea R06.00  History:        Patient has prior history of Echocardiogram examinations, most                 recent 01/17/2019. CHF, Arrythmias:Tachycardia,                 Signs/Symptoms:Dyspnea; Risk Factors:Hypertension, Dyslipidemia                 and Diabetes.  Sonographer:    Bernadene Person RDCS Referring Phys: [id number] ASIA B [place]  1. Left ventricular ejection fraction, by estimation, is 60 to 65%. Left ventricular ejection fraction by 2D MOD biplane is 62.0 %. The left ventricle has normal function. The left ventricle has no regional wall motion abnormalities. There is mild left ventricular hypertrophy. Left ventricular diastolic parameters are consistent with Grade II diastolic dysfunction (pseudonormalization). Elevated left ventricular end-diastolic pressure. The E/e' is 74.  2. Right ventricular systolic function is normal. The right ventricular size is normal. There is mildly elevated pulmonary artery systolic pressure. The estimated right ventricular systolic pressure is 16.9 mmHg.  3. Left atrial size was moderately dilated.  4. The mitral valve is abnormal. Mild mitral valve regurgitation.  5.  The aortic valve is tricuspid. Aortic valve regurgitation is not visualized. Mild aortic valve stenosis. Aortic valve area, by VTI measures 1.77 cm. Aortic valve mean gradient measures 11.0 mmHg. Aortic valve Vmax measures 2.16 m/s. DI is 0.51.  6. The inferior vena cava is dilated in size with >50% respiratory variability, suggesting right atrial pressure of 8 mmHg. Comparison(s): Changes from prior study are noted. 01/17/2019: LVEF 55-60%, mild LVH, grade 2 DD, moderate LAE, RVSP 29.7 mmHg. FINDINGS  Left Ventricle: Left ventricular ejection fraction, by estimation, is 60 to 65%. Left ventricular ejection fraction by 2D MOD biplane is 62.0 %.  The left ventricle has normal function. The left ventricle has no regional wall motion abnormalities. The left ventricular internal cavity size was normal in size. There is mild left ventricular hypertrophy. Abnormal (paradoxical) septal motion, consistent with left bundle branch block. Left ventricular diastolic parameters are consistent with Grade II diastolic dysfunction (pseudonormalization). Elevated left ventricular end-diastolic pressure. The E/e' is 7. Right Ventricle: The right ventricular size is normal. No increase in right ventricular wall thickness. Right ventricular systolic function is normal. There is mildly elevated pulmonary artery systolic pressure. The tricuspid regurgitant velocity is 2.69  m/s, and with an assumed right atrial pressure of 8 mmHg, the estimated right ventricular systolic pressure is 10.9 mmHg. Left Atrium: Left atrial size was moderately dilated. Right Atrium: Right atrial size was normal in size. Pericardium: There is no evidence of pericardial effusion. Mitral Valve: The mitral valve is abnormal. There is mild calcification of the anterior and posterior mitral valve leaflet(s). Mild mitral valve regurgitation. Tricuspid Valve: The tricuspid valve is grossly normal. Tricuspid valve regurgitation is trivial. Aortic Valve: The aortic  valve is tricuspid. Aortic valve regurgitation is not visualized. Mild aortic stenosis is present. Aortic valve mean gradient measures 11.0 mmHg. Aortic valve peak gradient measures 18.7 mmHg. Aortic valve area, by VTI measures 1.77 cm. Pulmonic Valve: The pulmonic valve was normal in structure. Pulmonic valve regurgitation is not visualized. Aorta: The aortic root and ascending aorta are structurally normal, with no evidence of dilitation. Venous: The inferior vena cava is dilated in size with greater than 50% respiratory variability, suggesting right atrial pressure of 8 mmHg. IAS/Shunts: No atrial level shunt detected by color flow Doppler.  LEFT VENTRICLE PLAX 2D                        Biplane EF (MOD) LVIDd:         5.20 cm         LV Biplane EF:   Left LVIDs:         3.30 cm                          ventricular LV PW:         1.20 cm                          ejection LV IVS:        1.00 cm                          fraction by LVOT diam:     2.10 cm                          2D MOD LV SV:         94                               biplane is LV SV Index:   47                               62.0 %. LVOT Area:     3.46 cm  Diastology                                LV e' medial:    5.22 cm/s LV Volumes (MOD)               LV E/e' medial:  23.4 LV vol d, MOD    199.0 ml      LV e' lateral:   7.97 cm/s A2C:                           LV E/e' lateral: 15.3 LV vol d, MOD    171.0 ml A4C: LV vol s, MOD    86.9 ml A2C: LV vol s, MOD    56.0 ml A4C: LV SV MOD A2C:   112.1 ml LV SV MOD A4C:   171.0 ml LV SV MOD BP:    114.9 ml RIGHT VENTRICLE RV S prime:     15.20 cm/s TAPSE (M-mode): 2.5 cm LEFT ATRIUM             Index        RIGHT ATRIUM           Index LA diam:        4.90 cm 2.45 cm/m   RA Area:     19.30 cm LA Vol (A2C):   73.5 ml 36.75 ml/m  RA Volume:   48.60 ml  24.30 ml/m LA Vol (A4C):   85.9 ml 42.95 ml/m LA Biplane Vol: 83.4 ml 41.70 ml/m  AORTIC VALVE AV Area (Vmax):    1.94  cm AV Area (Vmean):   1.87 cm AV Area (VTI):     1.77 cm AV Vmax:           216.00 cm/s AV Vmean:          155.000 cm/s AV VTI:            0.527 m AV Peak Grad:      18.7 mmHg AV Mean Grad:      11.0 mmHg LVOT Vmax:         121.00 cm/s LVOT Vmean:        83.900 cm/s LVOT VTI:          0.270 m LVOT/AV VTI ratio: 0.51  AORTA Ao Root diam: 3.30 cm Ao Asc diam:  3.30 cm MITRAL VALVE                TRICUSPID VALVE MV Area (PHT): 4.31 cm     TR Peak grad:   28.9 mmHg MV Decel Time: 176 msec     TR Vmax:        269.00 cm/s MV E velocity: 122.00 cm/s MV A velocity: 85.10 cm/s   SHUNTS MV E/A ratio:  1.43         Systemic VTI:  0.27 m                             Systemic Diam: 2.10 cm Lyman Bishop MD Electronically signed by Lyman Bishop MD Signature Date/Time: 06/13/2021/12:40:33 PM    Final     Microbiology: Results for orders placed or performed during the hospital encounter of 06/12/21  SARS Coronavirus 2 by RT PCR (hospital order, performed in [facility] hospital lab) *cepheid single result test* Anterior Nasal Swab     Status: None  Collection Time: 06/12/21 10:04 PM   Specimen: Anterior Nasal Swab  Result Value Ref Range Status   SARS Coronavirus 2 by RT PCR NEGATIVE NEGATIVE Final    Comment: (NOTE) SARS-CoV-2 target nucleic acids are NOT DETECTED.  The SARS-CoV-2 RNA is generally detectable in upper and lower respiratory specimens during the acute phase of infection. The lowest concentration of SARS-CoV-2 viral copies this assay can detect is 250 copies / mL. A negative result does not preclude SARS-CoV-2 infection and should not be used as the sole basis for treatment or other patient management decisions.  A negative result may occur with improper specimen collection / handling, submission of specimen other than nasopharyngeal swab, presence of viral mutation(s) within the areas targeted by this assay, and inadequate number of viral copies (<250 copies / mL). A negative result must be  combined with clinical observations, patient history, and epidemiological information.  Fact Sheet for Patients:   https://www.patel.info/  Fact Sheet for Healthcare Providers: https://hall.com/  This test is not yet approved or  cleared by the [place] FDA and has been authorized for detection and/or diagnosis of SARS-CoV-2 by FDA under an Emergency Use Authorization (EUA).  This EUA will remain in effect (meaning this test can be used) for the duration of the COVID-19 declaration under Section 564(b)(1) of the Act, 21 U.S.C. section 360bbb-3(b)(1), unless the authorization is terminated or revoked sooner.  Performed at [facility], [address], [place], [place] [address]     Labs: CBC: Recent Labs  Lab 06/12/21 1738 06/13/21 0445 06/16/21 0434  WBC 10.6* 9.3 7.1  NEUTROABS 8.8*  --   --   HGB 7.9* 7.3* 8.1*  HCT 25.2* 24.0* 25.7*  MCV 96.9 98.4 96.3  PLT 196 170 563   Basic Metabolic Panel: Recent Labs  Lab 06/13/21 0445 06/14/21 0504 06/15/21 0735 06/16/21 0433 06/17/21 0404 06/18/21 0427  NA 140 141 140 141 146* 145  K 4.0 4.1 3.9 4.0 3.8 3.9  CL 110 113* 112* 115* 118* 117*  CO2 18* 21* 20* '23 23 24  '$ GLUCOSE 119* 120* 105* 81 33* 109*  BUN 98* 106* 106* 87* 88* 76*  CREATININE 3.63* 3.78* 3.30* 2.62* 2.05* 1.65*  CALCIUM 8.2* 8.3* 7.9* 7.9* 8.6* 8.3*  MG 2.1  --   --   --   --   --   PHOS  --   --  5.8* 4.8* 3.6 2.8   Liver Function Tests: Recent Labs  Lab 06/13/21 0445 06/15/21 0735 06/16/21 0433 06/17/21 0404 06/18/21 0427  AST 17  --   --   --   --   ALT 18  --   --   --   --   ALKPHOS 52  --   --   --   --   BILITOT 0.7  --   --   --   --   PROT 6.0*  --   --   --   --   ALBUMIN 3.1* 2.8* 2.9* 2.9* 2.7*   CBG: Recent Labs  Lab 06/17/21 1707 06/17/21 2105 06/18/21 0501 06/18/21 0728 06/18/21 1113  GLUCAP 123* 202* 124* 101* 124*    Discharge time spent: greater than 30  minutes.  Signed: Orson Eva, MD [facility] 06/18/2021

## 2021-06-18 NOTE — TOC Transition Note (Signed)
Transition of Care [facility]) - CM/SW Discharge Note   Patient Details  Name: Christy [name] MRN: [id number] Date of Birth: [birth year]  [facility]) CM/SW Contact:  Boneta Lucks, RN Phone Number: 06/18/2021, 11:58 AM   Clinical Narrative:   Patient is medically ready to discharge. Family agreeable to [facility]. [name] provided room number 413-1. RN called report. DC summary sent in the hub and medical necessity printed. TOC called to update daughter, [name]. TOC will call EMS when RN is ready.    Final next level of care: Skilled Nursing Facility Barriers to Discharge: Continued Medical Work up  Patient Goals and CMS Choice Patient states their goals for this hospitalization and ongoing recovery are:: agreeable to SNF CMS Medicare.gov Compare Post Acute Care list provided to:: Patient Choice offered to / list presented to : Patient  Discharge Placement          Patient to be transferred to facility by: EMS Name of family member notified: [name] Patient and family notified of of transfer: 06/18/21  Discharge Plan and Services   Discharge Planning Services: CM Consult             Readmission Risk Interventions    06/18/2021   11:56 AM 06/13/2021   12:19 PM  Readmission Risk Prevention Plan  Transportation Screening Complete Complete  PCP or Specialist Appt within 3-5 Days Complete   HRI or Home Care Consult Complete Complete  Social Work Consult for [place] Planning/Counseling Complete Complete  Palliative Care Screening Not Applicable Not Applicable  Medication Review Press photographer) Complete Complete

## 2021-06-18 NOTE — Care Management Important Message (Signed)
Important Message  Patient Details  Name: Christy [name] MRN: [id number] Date of Birth: [birth year]   Medicare Important Message Given:  Yes (spoke with daughter Vinetta Bergamo at [phone] to review letter, no additional copy needed)     Tommy Medal 06/18/2021, 12:24 PM

## 2021-06-18 NOTE — Progress Notes (Signed)
Report given to [facility] LPN

## 2021-06-19 DIAGNOSIS — G9341 Metabolic encephalopathy: Secondary | ICD-10-CM | POA: Diagnosis not present

## 2021-06-19 DIAGNOSIS — J189 Pneumonia, unspecified organism: Secondary | ICD-10-CM | POA: Diagnosis not present

## 2021-06-19 DIAGNOSIS — R001 Bradycardia, unspecified: Secondary | ICD-10-CM | POA: Diagnosis not present

## 2021-06-19 DIAGNOSIS — R5381 Other malaise: Secondary | ICD-10-CM | POA: Diagnosis not present

## 2021-06-19 DIAGNOSIS — E119 Type 2 diabetes mellitus without complications: Secondary | ICD-10-CM | POA: Diagnosis not present

## 2021-06-21 DIAGNOSIS — D649 Anemia, unspecified: Secondary | ICD-10-CM | POA: Diagnosis not present

## 2021-06-21 DIAGNOSIS — E119 Type 2 diabetes mellitus without complications: Secondary | ICD-10-CM | POA: Diagnosis not present

## 2021-06-21 DIAGNOSIS — D519 Vitamin B12 deficiency anemia, unspecified: Secondary | ICD-10-CM | POA: Diagnosis not present

## 2021-06-21 DIAGNOSIS — E559 Vitamin D deficiency, unspecified: Secondary | ICD-10-CM | POA: Diagnosis not present

## 2021-06-23 DIAGNOSIS — Z79899 Other long term (current) drug therapy: Secondary | ICD-10-CM | POA: Diagnosis not present

## 2021-06-23 DIAGNOSIS — E119 Type 2 diabetes mellitus without complications: Secondary | ICD-10-CM | POA: Diagnosis not present

## 2021-06-23 DIAGNOSIS — Z794 Long term (current) use of insulin: Secondary | ICD-10-CM | POA: Diagnosis not present

## 2021-06-24 DIAGNOSIS — R0989 Other specified symptoms and signs involving the circulatory and respiratory systems: Secondary | ICD-10-CM | POA: Diagnosis not present

## 2021-06-24 DIAGNOSIS — I517 Cardiomegaly: Secondary | ICD-10-CM | POA: Diagnosis not present

## 2021-06-24 DIAGNOSIS — J189 Pneumonia, unspecified organism: Secondary | ICD-10-CM | POA: Diagnosis not present

## 2021-06-24 DIAGNOSIS — R5381 Other malaise: Secondary | ICD-10-CM | POA: Diagnosis not present

## 2021-06-24 DIAGNOSIS — G9341 Metabolic encephalopathy: Secondary | ICD-10-CM | POA: Diagnosis not present

## 2021-06-25 DIAGNOSIS — I5032 Chronic diastolic (congestive) heart failure: Secondary | ICD-10-CM | POA: Diagnosis not present

## 2021-06-25 DIAGNOSIS — N1832 Chronic kidney disease, stage 3b: Secondary | ICD-10-CM | POA: Diagnosis not present

## 2021-06-26 DIAGNOSIS — J189 Pneumonia, unspecified organism: Secondary | ICD-10-CM | POA: Diagnosis not present

## 2021-06-26 DIAGNOSIS — I5032 Chronic diastolic (congestive) heart failure: Secondary | ICD-10-CM | POA: Diagnosis not present

## 2021-06-26 DIAGNOSIS — R0602 Shortness of breath: Secondary | ICD-10-CM | POA: Diagnosis not present

## 2021-06-30 DIAGNOSIS — I5032 Chronic diastolic (congestive) heart failure: Secondary | ICD-10-CM | POA: Diagnosis not present

## 2021-07-07 DIAGNOSIS — R0602 Shortness of breath: Secondary | ICD-10-CM | POA: Diagnosis not present

## 2021-07-07 DIAGNOSIS — J189 Pneumonia, unspecified organism: Secondary | ICD-10-CM | POA: Diagnosis not present

## 2021-07-08 NOTE — Progress Notes (Unsigned)
Synopsis: Referred for exertional hypoxia limiting ability to complete PT by Virginia Rochester, NP  Subjective:   PATIENT ID: Christy [name] GENDER: female DOB: 1950/09/27, MRN: [id number]  No chief complaint on file.  52yF with history of BiPD, diastolic CHF, CAD, OSA on CPAP, DM2, RTA type IV, MGUS referred for exertional hypoxia limiting ability  She had recent admission through 06/18/21 for CAP, AKI on CKD due to hypotension  Otherwise pertinent review of systems is negative.  Past Medical History:  Diagnosis Date   Arthritis    Bipolar disorder ([place])    CHF (congestive heart failure) ([place])    Coronary atherosclerosis of native coronary artery    BMS circ 2006, 70% PDA 2009   Essential hypertension    Mixed hyperlipidemia    OSA on CPAP    Renal tubular acidosis, type IV    SVT (supraventricular tachycardia) ([facility])    Type 2 diabetes mellitus ([place])      Family History  Problem Relation Age of Onset   Bipolar disorder Brother    Diabetes Father      Past Surgical History:  Procedure Laterality Date   Acromioclavicular arthritis     BALLOON DILATION N/A 12/12/2019   Procedure: BALLOON DILATION;  Surgeon: [name], DO;  [facility];  Service: Endoscopy;  Laterality: N/A;   BALLOON DILATION N/A 06/25/2020   Procedure: BALLOON DILATION;  Surgeon: [name], DO;  [facility];  Service: Endoscopy;  Laterality: N/A;   BIOPSY  12/12/2019   Procedure: BIOPSY;  Surgeon: [name], DO;  [facility];  Service: Endoscopy;;   CARDIAC CATHETERIZATION N/A 03/29/2015   Procedure: Left Heart Cath and Coronary Angiography;  Surgeon: Troy Sine, MD;  Location: [facility];  Service: Cardiovascular;  Laterality: N/A;   CATARACT EXTRACTION Bilateral    COLONOSCOPY WITH PROPOFOL N/A 12/12/2019   Procedure: COLONOSCOPY WITH PROPOFOL;  Surgeon: [name], DO;  [facility];  Service: Endoscopy;   Laterality: N/A;  2:30pm   ESOPHAGOGASTRODUODENOSCOPY (EGD) WITH PROPOFOL N/A 12/12/2019   Procedure: ESOPHAGOGASTRODUODENOSCOPY (EGD) WITH PROPOFOL;  Surgeon: [name], DO;  [facility];  Service: Endoscopy;  Laterality: N/A;   ESOPHAGOGASTRODUODENOSCOPY (EGD) WITH PROPOFOL N/A 06/25/2020   Procedure: ESOPHAGOGASTRODUODENOSCOPY (EGD) WITH PROPOFOL;  Surgeon: [name], DO;  [facility];  Service: Endoscopy;  Laterality: N/A;  1:45pm   POLYPECTOMY  12/12/2019   Procedure: POLYPECTOMY;  Surgeon: [name], DO;  [facility];  Service: Endoscopy;;   RIght shoulder adhesive capsulitis     Rotator cuff impingement syndrome Right    Rotator cuff tear      Social History   Socioeconomic History   Marital status: Divorced    Spouse name: Not on file   Number of children: Not on file   Years of education: Not on file   Highest education level: Not on file  Occupational History   Occupation: PLANNING AND INSPECTOR    Employer: [employer]  Tobacco Use   Smoking status: Former    Packs/day: 1.00    Years: 20.00    Total pack years: 20.00    Types: Cigarettes    Start date: 10/30/1984    Quit date: 07/08/2013    Years since quitting: 8.0   Smokeless tobacco: Never  Vaping Use   Vaping Use: Never used  Substance and Sexual Activity   Alcohol use:  No    Alcohol/week: 0.0 standard drinks of alcohol   Drug use: No   Sexual activity: Not Currently  Other Topics Concern   Not on file  Social History Narrative   She has lived with some of her daughters. Works part-time for the city.    Social Determinants of Health   Financial Resource Strain: Not on file  Food Insecurity: Not on file  Transportation Needs: Not on file  Physical Activity: Not on file  Stress: Not on file  Social Connections: Not on file  Intimate Partner Violence: Not on file     Allergies  Allergen Reactions   Ace Inhibitors Cough   Lisinopril Cough     Severe cough   Penicillins     Unknown, childhood reaction   Tetracycline Nausea Only   Lamictal [Lamotrigine] Rash    Rash start inside to outter body.     Outpatient Medications Prior to Visit  Medication Sig Dispense Refill   allopurinol (ZYLOPRIM) 300 MG tablet Take 150 mg by mouth in the morning.     amLODipine (NORVASC) 5 MG tablet Take 1 tablet (5 mg total) by mouth daily.     aspirin 81 MG EC tablet Take 81 mg by mouth at bedtime.      buPROPion (WELLBUTRIN XL) 300 MG 24 hr tablet Take 300 mg by mouth in the morning.     calcitRIOL (ROCALTROL) 0.25 MCG capsule Take 0.25 mcg by mouth 3 (three) times a week.     cariprazine (VRAYLAR) 1.5 MG capsule Take 1 capsule (1.5 mg total) by mouth daily. 30 capsule    carvedilol (COREG) 12.5 MG tablet Take 12.5 mg by mouth 2 (two) times daily with a meal.     divalproex (DEPAKOTE) 250 MG EC tablet Take 250 mg by mouth in the morning.     epoetin alfa (EPOGEN) 3000 UNIT/ML injection Inject into the skin.     esomeprazole (NEXIUM) 40 MG capsule Take 1 capsule (40 mg total) by mouth 2 (two) times daily before a meal. 60 capsule 11   GLOBAL EASE INJECT PEN NEEDLES 32G X 4 MM MISC See admin instructions.     hydrALAZINE (APRESOLINE) 50 MG tablet Take 1 tablet (50 mg total) by mouth every 8 (eight) hours.     hydrOXYzine (VISTARIL) 50 MG capsule Take 50 mg by mouth 3 (three) times daily as needed for anxiety or itching.     isosorbide mononitrate (IMDUR) 60 MG 24 hr tablet Take 60 mg by mouth in the morning.     rosuvastatin (CRESTOR) 10 MG tablet Take 10 mg by mouth in the morning.     sertraline (ZOLOFT) 100 MG tablet Take 100 mg by mouth 2 (two) times daily.     torsemide 40 MG TABS Take 40 mg by mouth daily.     traZODone (DESYREL) 100 MG tablet Take 100 mg by mouth at bedtime.   3   TRESIBA FLEXTOUCH 200 UNIT/ML SOPN Inject 28 Units into the skin in the morning.  2   No facility-administered medications prior to visit.        Objective:   Physical Exam:  General appearance: 71 y.o., female, NAD, conversant  Eyes: anicteric sclerae; PERRL, tracking appropriately HENT: NCAT; MMM Neck: Trachea midline; no lymphadenopathy, no JVD Lungs: CTAB, no crackles, no wheeze, with normal respiratory effort CV: RRR, no murmur  Abdomen: Soft, non-tender; non-distended, BS present  Extremities: No peripheral edema, warm Skin: Normal turgor and texture; no rash Psych:  Appropriate affect Neuro: Alert and oriented to person and place, no focal deficit     There were no vitals filed for this visit.   on *** LPM *** RA BMI Readings from Last 3 Encounters:  06/18/21 38.55 kg/m  06/11/21 37.48 kg/m  03/31/21 36.95 kg/m   Wt Readings from Last 3 Encounters:  06/18/21 217 lb 9.5 oz (98.7 kg)  06/11/21 211 lb 9.6 oz (96 kg)  03/31/21 208 lb 9.6 oz (94.6 kg)     CBC    Component Value Date/Time   WBC 7.1 06/16/2021 0434   RBC 2.67 (L) 06/16/2021 0434   HGB 8.1 (L) 06/16/2021 0434   HCT 25.7 (L) 06/16/2021 0434   PLT 226 06/16/2021 0434   MCV 96.3 06/16/2021 0434   MCH 30.3 06/16/2021 0434   MCHC 31.5 06/16/2021 0434   RDW 14.4 06/16/2021 0434   LYMPHSABS 1.0 06/12/2021 1738   MONOABS 0.7 06/12/2021 1738   EOSABS 0.1 06/12/2021 1738   BASOSABS 0.1 06/12/2021 1738     Chest Imaging: CT Chest 06/12/21 reviewed by me with possible small RLL consolidation, patulous esophagus  Esophagram 04/15/20: Stricture at GE junction obstructing a 12.5 mm barium tablet and recreating patient's symptoms.   Age-related esophageal dysmotility.   Mild vallecular and piriform sinus residuals without aspiration.   Small distal thoracic esophageal diverticulum.  EGD 06/25/20 with gastritis and benign appearing stenosis, dilated.  Pulmonary Functions Testing Results:     No data to display           Echocardiogram:   TTE 06/13/21:  1. Left ventricular ejection fraction, by estimation, is 60 to 65%. Left   ventricular ejection fraction by 2D MOD biplane is 62.0 %. The left  ventricle has normal function. The left ventricle has no regional wall  motion abnormalities. There is mild left  ventricular hypertrophy. Left ventricular diastolic parameters are  consistent with Grade II diastolic dysfunction (pseudonormalization).  Elevated left ventricular end-diastolic pressure. The E/e' is 36.   2. Right ventricular systolic function is normal. The right ventricular  size is normal. There is mildly elevated pulmonary artery systolic  pressure. The estimated right ventricular systolic pressure is 99.3 mmHg.   3. Left atrial size was moderately dilated.   4. The mitral valve is abnormal. Mild mitral valve regurgitation.   5. The aortic valve is tricuspid. Aortic valve regurgitation is not  visualized. Mild aortic valve stenosis. Aortic valve area, by VTI measures  1.77 cm. Aortic valve mean gradient measures 11.0 mmHg. Aortic valve Vmax  measures 2.16 m/s. DI is 0.51.   6. The inferior vena cava is dilated in size with >50% respiratory  variability, suggesting right atrial pressure of 8 mmHg.   Heart Catheterization:   LHC 2017: No significant coronary obstructive disease with evidence for widely patent stent in the proximal left circumflex artery, a normal LAD and a normal very large dominant RCA.   Systemic hypertension with elevated left ventricular end-diastolic pressure at 37 mm.    Assessment & Plan:    Plan:      Christy Hurter, MD [facility] 07/08/2021 1:12 PM

## 2021-07-09 ENCOUNTER — Ambulatory Visit (INDEPENDENT_AMBULATORY_CARE_PROVIDER_SITE_OTHER): Payer: Medicare Other | Admitting: Student

## 2021-07-09 ENCOUNTER — Encounter: Payer: Self-pay | Admitting: Student

## 2021-07-09 ENCOUNTER — Ambulatory Visit (INDEPENDENT_AMBULATORY_CARE_PROVIDER_SITE_OTHER): Payer: Medicare Other

## 2021-07-09 VITALS — BP 142/82 | HR 59 | Temp 97.8°F | Ht 63.0 in | Wt 221.0 lb

## 2021-07-09 DIAGNOSIS — I5032 Chronic diastolic (congestive) heart failure: Secondary | ICD-10-CM | POA: Diagnosis not present

## 2021-07-09 DIAGNOSIS — J90 Pleural effusion, not elsewhere classified: Secondary | ICD-10-CM | POA: Diagnosis not present

## 2021-07-09 DIAGNOSIS — R0600 Dyspnea, unspecified: Secondary | ICD-10-CM

## 2021-07-09 DIAGNOSIS — R0902 Hypoxemia: Secondary | ICD-10-CM

## 2021-07-09 DIAGNOSIS — M7989 Other specified soft tissue disorders: Secondary | ICD-10-CM | POA: Diagnosis not present

## 2021-07-09 NOTE — Patient Instructions (Addendum)
-   x ray today - try stiolto 2 puffs once daily - switch to albuterol nebulizer every 6 hours as needed while on stiolto - stop duonebs - take torsemide 40 mg twice daily until you reach target weight of 215 lb, then resume torsemide once daily - you will be called to schedule leg ultrasounds, echocardiogram/bubble study of your heart - would try to resume your home CPAP  REPORT OF CONSULTATION  # Chronic hypoxic respiratory failure Maybe untreated OSA contributing to slow recovery? Otherwise her O2 requirement even when she was hospitalized seems out of proportion to her CT findings.    # BLE swelling, tenderness Likely combination of decompensated chronic diastolic heart failure, low oncotic pressure. Tenderness and recent hospitalization raise question of DVT   # Dyspnea Relatively mild per pt but improves with breathing treatments. Smoking history puts at risk for COPD but her dyspnea is probably multifactorial with deconditioning after recent hospitalization, diastolic dysfunction.    # OSA not on CPAP   Plan: - x ray today, may need to consider V/Q scan if CXR unremarkable and clear enough to allow perfusion scan to be interpretable - try stiolto 2 puffs once daily - switch to albuterol nebulizer every 6 hours as needed while on stiolto - stop duonebs - take torsemide 40 mg twice daily until you reach target weight of 215 lb, then resume torsemide once daily - you will be called to schedule leg ultrasounds, echocardiogram/bubble study of your heart - would try to resume your home CPAP

## 2021-07-10 ENCOUNTER — Other Ambulatory Visit: Payer: Self-pay | Admitting: Student

## 2021-07-10 DIAGNOSIS — M7989 Other specified soft tissue disorders: Secondary | ICD-10-CM

## 2021-07-13 ENCOUNTER — Inpatient Hospital Stay
Admission: AD | Admit: 2021-07-13 | Payer: Medicare Other | Source: Other Acute Inpatient Hospital | Admitting: Internal Medicine

## 2021-07-13 DIAGNOSIS — D472 Monoclonal gammopathy: Secondary | ICD-10-CM | POA: Diagnosis not present

## 2021-07-13 DIAGNOSIS — Z66 Do not resuscitate: Secondary | ICD-10-CM | POA: Diagnosis not present

## 2021-07-13 DIAGNOSIS — J449 Chronic obstructive pulmonary disease, unspecified: Secondary | ICD-10-CM | POA: Diagnosis not present

## 2021-07-13 DIAGNOSIS — I4891 Unspecified atrial fibrillation: Secondary | ICD-10-CM | POA: Diagnosis not present

## 2021-07-13 DIAGNOSIS — J90 Pleural effusion, not elsewhere classified: Secondary | ICD-10-CM | POA: Diagnosis not present

## 2021-07-13 DIAGNOSIS — R059 Cough, unspecified: Secondary | ICD-10-CM | POA: Diagnosis not present

## 2021-07-13 DIAGNOSIS — I447 Left bundle-branch block, unspecified: Secondary | ICD-10-CM | POA: Diagnosis not present

## 2021-07-13 DIAGNOSIS — F319 Bipolar disorder, unspecified: Secondary | ICD-10-CM | POA: Diagnosis not present

## 2021-07-13 DIAGNOSIS — I059 Rheumatic mitral valve disease, unspecified: Secondary | ICD-10-CM | POA: Diagnosis not present

## 2021-07-13 DIAGNOSIS — I428 Other cardiomyopathies: Secondary | ICD-10-CM | POA: Diagnosis not present

## 2021-07-13 DIAGNOSIS — N184 Chronic kidney disease, stage 4 (severe): Secondary | ICD-10-CM | POA: Diagnosis not present

## 2021-07-13 DIAGNOSIS — J984 Other disorders of lung: Secondary | ICD-10-CM | POA: Diagnosis not present

## 2021-07-13 DIAGNOSIS — J9601 Acute respiratory failure with hypoxia: Secondary | ICD-10-CM | POA: Diagnosis not present

## 2021-07-13 DIAGNOSIS — R0902 Hypoxemia: Secondary | ICD-10-CM | POA: Diagnosis not present

## 2021-07-13 DIAGNOSIS — Z7189 Other specified counseling: Secondary | ICD-10-CM | POA: Diagnosis not present

## 2021-07-13 DIAGNOSIS — I3481 Nonrheumatic mitral (valve) annulus calcification: Secondary | ICD-10-CM | POA: Diagnosis not present

## 2021-07-13 DIAGNOSIS — Z91199 Patient's noncompliance with other medical treatment and regimen due to unspecified reason: Secondary | ICD-10-CM | POA: Diagnosis not present

## 2021-07-13 DIAGNOSIS — R609 Edema, unspecified: Secondary | ICD-10-CM | POA: Diagnosis not present

## 2021-07-13 DIAGNOSIS — Z4682 Encounter for fitting and adjustment of non-vascular catheter: Secondary | ICD-10-CM | POA: Diagnosis not present

## 2021-07-13 DIAGNOSIS — I517 Cardiomegaly: Secondary | ICD-10-CM | POA: Diagnosis not present

## 2021-07-13 DIAGNOSIS — E119 Type 2 diabetes mellitus without complications: Secondary | ICD-10-CM | POA: Diagnosis not present

## 2021-07-13 DIAGNOSIS — J9811 Atelectasis: Secondary | ICD-10-CM | POA: Diagnosis not present

## 2021-07-13 DIAGNOSIS — T17320D Food in larynx causing asphyxiation, subsequent encounter: Secondary | ICD-10-CM | POA: Diagnosis not present

## 2021-07-13 DIAGNOSIS — K219 Gastro-esophageal reflux disease without esophagitis: Secondary | ICD-10-CM | POA: Diagnosis not present

## 2021-07-13 DIAGNOSIS — Z20822 Contact with and (suspected) exposure to covid-19: Secondary | ICD-10-CM | POA: Diagnosis not present

## 2021-07-13 DIAGNOSIS — I454 Nonspecific intraventricular block: Secondary | ICD-10-CM | POA: Diagnosis not present

## 2021-07-13 DIAGNOSIS — R0602 Shortness of breath: Secondary | ICD-10-CM | POA: Diagnosis not present

## 2021-07-13 DIAGNOSIS — B370 Candidal stomatitis: Secondary | ICD-10-CM | POA: Diagnosis not present

## 2021-07-13 DIAGNOSIS — B348 Other viral infections of unspecified site: Secondary | ICD-10-CM | POA: Diagnosis not present

## 2021-07-13 DIAGNOSIS — I5189 Other ill-defined heart diseases: Secondary | ICD-10-CM | POA: Diagnosis not present

## 2021-07-13 DIAGNOSIS — I080 Rheumatic disorders of both mitral and aortic valves: Secondary | ICD-10-CM | POA: Diagnosis not present

## 2021-07-13 DIAGNOSIS — I5043 Acute on chronic combined systolic (congestive) and diastolic (congestive) heart failure: Secondary | ICD-10-CM | POA: Diagnosis not present

## 2021-07-13 DIAGNOSIS — I10 Essential (primary) hypertension: Secondary | ICD-10-CM | POA: Diagnosis not present

## 2021-07-13 DIAGNOSIS — J811 Chronic pulmonary edema: Secondary | ICD-10-CM | POA: Diagnosis not present

## 2021-07-13 DIAGNOSIS — I350 Nonrheumatic aortic (valve) stenosis: Secondary | ICD-10-CM | POA: Diagnosis not present

## 2021-07-13 DIAGNOSIS — R069 Unspecified abnormalities of breathing: Secondary | ICD-10-CM | POA: Diagnosis not present

## 2021-07-13 DIAGNOSIS — J129 Viral pneumonia, unspecified: Secondary | ICD-10-CM | POA: Diagnosis not present

## 2021-07-13 DIAGNOSIS — I509 Heart failure, unspecified: Secondary | ICD-10-CM | POA: Diagnosis not present

## 2021-07-13 DIAGNOSIS — I110 Hypertensive heart disease with heart failure: Secondary | ICD-10-CM | POA: Diagnosis not present

## 2021-07-13 DIAGNOSIS — Z515 Encounter for palliative care: Secondary | ICD-10-CM | POA: Diagnosis not present

## 2021-07-13 DIAGNOSIS — R1310 Dysphagia, unspecified: Secondary | ICD-10-CM | POA: Diagnosis not present

## 2021-07-13 DIAGNOSIS — I2510 Atherosclerotic heart disease of native coronary artery without angina pectoris: Secondary | ICD-10-CM | POA: Diagnosis not present

## 2021-07-13 DIAGNOSIS — J1289 Other viral pneumonia: Secondary | ICD-10-CM | POA: Diagnosis not present

## 2021-07-13 DIAGNOSIS — I480 Paroxysmal atrial fibrillation: Secondary | ICD-10-CM | POA: Diagnosis not present

## 2021-07-13 DIAGNOSIS — J9602 Acute respiratory failure with hypercapnia: Secondary | ICD-10-CM | POA: Diagnosis not present

## 2021-07-13 DIAGNOSIS — Z48813 Encounter for surgical aftercare following surgery on the respiratory system: Secondary | ICD-10-CM | POA: Diagnosis not present

## 2021-07-13 DIAGNOSIS — Z452 Encounter for adjustment and management of vascular access device: Secondary | ICD-10-CM | POA: Diagnosis not present

## 2021-07-13 DIAGNOSIS — N2589 Other disorders resulting from impaired renal tubular function: Secondary | ICD-10-CM | POA: Diagnosis not present

## 2021-07-13 DIAGNOSIS — Z9911 Dependence on respirator [ventilator] status: Secondary | ICD-10-CM | POA: Diagnosis not present

## 2021-07-13 DIAGNOSIS — I5030 Unspecified diastolic (congestive) heart failure: Secondary | ICD-10-CM | POA: Diagnosis not present

## 2021-07-13 DIAGNOSIS — I519 Heart disease, unspecified: Secondary | ICD-10-CM | POA: Diagnosis not present

## 2021-07-13 DIAGNOSIS — R0789 Other chest pain: Secondary | ICD-10-CM | POA: Diagnosis not present

## 2021-07-13 DIAGNOSIS — I3489 Other nonrheumatic mitral valve disorders: Secondary | ICD-10-CM | POA: Diagnosis not present

## 2021-07-13 DIAGNOSIS — R918 Other nonspecific abnormal finding of lung field: Secondary | ICD-10-CM | POA: Diagnosis not present

## 2021-07-13 DIAGNOSIS — J441 Chronic obstructive pulmonary disease with (acute) exacerbation: Secondary | ICD-10-CM | POA: Diagnosis not present

## 2021-07-13 DIAGNOSIS — E1122 Type 2 diabetes mellitus with diabetic chronic kidney disease: Secondary | ICD-10-CM | POA: Diagnosis not present

## 2021-07-13 DIAGNOSIS — I3139 Other pericardial effusion (noninflammatory): Secondary | ICD-10-CM | POA: Diagnosis not present

## 2021-07-13 DIAGNOSIS — G4733 Obstructive sleep apnea (adult) (pediatric): Secondary | ICD-10-CM | POA: Diagnosis not present

## 2021-07-13 DIAGNOSIS — N179 Acute kidney failure, unspecified: Secondary | ICD-10-CM | POA: Diagnosis not present

## 2021-07-13 DIAGNOSIS — E8770 Fluid overload, unspecified: Secondary | ICD-10-CM | POA: Diagnosis not present

## 2021-07-13 DIAGNOSIS — J189 Pneumonia, unspecified organism: Secondary | ICD-10-CM | POA: Diagnosis not present

## 2021-07-13 DIAGNOSIS — I700 Atherosclerosis of aorta: Secondary | ICD-10-CM | POA: Diagnosis not present

## 2021-07-13 DIAGNOSIS — Z91198 Patient's noncompliance with other medical treatment and regimen for other reason: Secondary | ICD-10-CM | POA: Diagnosis not present

## 2021-07-13 DIAGNOSIS — R0603 Acute respiratory distress: Secondary | ICD-10-CM | POA: Diagnosis not present

## 2021-07-13 DIAGNOSIS — D649 Anemia, unspecified: Secondary | ICD-10-CM | POA: Diagnosis not present

## 2021-07-13 DIAGNOSIS — J80 Acute respiratory distress syndrome: Secondary | ICD-10-CM | POA: Diagnosis not present

## 2021-07-13 DIAGNOSIS — J9622 Acute and chronic respiratory failure with hypercapnia: Secondary | ICD-10-CM | POA: Diagnosis not present

## 2021-07-13 DIAGNOSIS — I130 Hypertensive heart and chronic kidney disease with heart failure and stage 1 through stage 4 chronic kidney disease, or unspecified chronic kidney disease: Secondary | ICD-10-CM | POA: Diagnosis not present

## 2021-07-13 DIAGNOSIS — I5023 Acute on chronic systolic (congestive) heart failure: Secondary | ICD-10-CM | POA: Diagnosis not present

## 2021-07-13 DIAGNOSIS — J9621 Acute and chronic respiratory failure with hypoxia: Secondary | ICD-10-CM | POA: Diagnosis not present

## 2021-07-13 DIAGNOSIS — J9692 Respiratory failure, unspecified with hypercapnia: Secondary | ICD-10-CM | POA: Diagnosis not present

## 2021-07-14 DIAGNOSIS — J189 Pneumonia, unspecified organism: Secondary | ICD-10-CM | POA: Diagnosis not present

## 2021-07-14 DIAGNOSIS — I517 Cardiomegaly: Secondary | ICD-10-CM | POA: Diagnosis not present

## 2021-07-14 DIAGNOSIS — J984 Other disorders of lung: Secondary | ICD-10-CM | POA: Diagnosis not present

## 2021-07-14 DIAGNOSIS — J90 Pleural effusion, not elsewhere classified: Secondary | ICD-10-CM | POA: Diagnosis not present

## 2021-07-17 DIAGNOSIS — J189 Pneumonia, unspecified organism: Secondary | ICD-10-CM | POA: Diagnosis not present

## 2021-07-17 DIAGNOSIS — J811 Chronic pulmonary edema: Secondary | ICD-10-CM | POA: Diagnosis not present

## 2021-07-18 DIAGNOSIS — J9811 Atelectasis: Secondary | ICD-10-CM | POA: Diagnosis not present

## 2021-07-18 DIAGNOSIS — J984 Other disorders of lung: Secondary | ICD-10-CM | POA: Diagnosis not present

## 2021-07-18 DIAGNOSIS — R918 Other nonspecific abnormal finding of lung field: Secondary | ICD-10-CM | POA: Diagnosis not present

## 2021-07-23 ENCOUNTER — Ambulatory Visit (HOSPITAL_COMMUNITY): Payer: Medicare Other | Attending: Student

## 2021-07-23 ENCOUNTER — Ambulatory Visit (HOSPITAL_COMMUNITY): Admission: RE | Admit: 2021-07-23 | Payer: Medicare Other | Source: Ambulatory Visit

## 2021-07-31 ENCOUNTER — Encounter (HOSPITAL_COMMUNITY): Payer: Self-pay | Admitting: Student

## 2021-08-05 ENCOUNTER — Telehealth (HOSPITAL_BASED_OUTPATIENT_CLINIC_OR_DEPARTMENT_OTHER): Payer: Self-pay | Admitting: *Deleted

## 2021-08-05 NOTE — Telephone Encounter (Signed)
Called to discuss rescheduling the lower venous doppler ordered by Dr. Luna Fuse answer and voice mail is full

## 2021-08-08 NOTE — Telephone Encounter (Signed)
Left message for patient to call and  discuss rescheduling the lower venous doppler ordered by Dr. Verlee Monte.

## 2021-08-11 ENCOUNTER — Encounter (HOSPITAL_BASED_OUTPATIENT_CLINIC_OR_DEPARTMENT_OTHER): Payer: Self-pay | Admitting: *Deleted

## 2021-08-12 ENCOUNTER — Telehealth (HOSPITAL_COMMUNITY): Payer: Self-pay | Admitting: Student

## 2021-08-12 NOTE — Telephone Encounter (Signed)
Just an FYI. We have made several attempts to contact this patient including sending a letter to schedule or reschedule their echocardiogram. We will be removing the patient from the echo [name].   07/31/21 MAILED LETTER [facility]  07/31/21 Called and VM is full andunable to LVM @ 1:48/LBW  07/23/21 NO SHOWED  07/28/21 Called and VM is full and unable to Leave message @ 11:42/[facility]W      Thank you

## 2021-08-19 NOTE — Telephone Encounter (Signed)
Called to discuss rescheduling the lower venous doppler ordered by Dr. Luna Fuse answer and voice mail is full---will mail letter requesting patient call

## 2021-08-19 DEATH — deceased

## 2021-08-26 ENCOUNTER — Ambulatory Visit: Payer: Medicare Other | Admitting: Student

## 2021-09-03 ENCOUNTER — Encounter (INDEPENDENT_AMBULATORY_CARE_PROVIDER_SITE_OTHER): Payer: Medicare Other | Admitting: Ophthalmology

## 2021-10-27 ENCOUNTER — Other Ambulatory Visit (HOSPITAL_COMMUNITY): Payer: Medicare Other

## 2021-11-03 ENCOUNTER — Ambulatory Visit (HOSPITAL_COMMUNITY): Payer: Medicare Other | Admitting: Hematology

## 2021-12-17 ENCOUNTER — Ambulatory Visit: Payer: Medicare Other | Admitting: Gastroenterology
